# Patient Record
Sex: Female | Born: 1955 | Race: Black or African American | Hispanic: No | Smoking: Never smoker
Health system: Southern US, Community
[De-identification: ages and names within clinical notes are randomized; demographics above are authoritative.]

## PROBLEM LIST (undated history)

## (undated) DIAGNOSIS — E039 Hypothyroidism, unspecified: Secondary | ICD-10-CM

## (undated) DIAGNOSIS — F32A Depression, unspecified: Secondary | ICD-10-CM

## (undated) DIAGNOSIS — I509 Heart failure, unspecified: Secondary | ICD-10-CM

## (undated) DIAGNOSIS — E119 Type 2 diabetes mellitus without complications: Secondary | ICD-10-CM

## (undated) DIAGNOSIS — D649 Anemia, unspecified: Secondary | ICD-10-CM

## (undated) DIAGNOSIS — I2510 Atherosclerotic heart disease of native coronary artery without angina pectoris: Secondary | ICD-10-CM

## (undated) DIAGNOSIS — F419 Anxiety disorder, unspecified: Secondary | ICD-10-CM

## (undated) DIAGNOSIS — J449 Chronic obstructive pulmonary disease, unspecified: Secondary | ICD-10-CM

## (undated) DIAGNOSIS — I219 Acute myocardial infarction, unspecified: Secondary | ICD-10-CM

## (undated) DIAGNOSIS — N189 Chronic kidney disease, unspecified: Secondary | ICD-10-CM

## (undated) DIAGNOSIS — G4730 Sleep apnea, unspecified: Secondary | ICD-10-CM

## (undated) DIAGNOSIS — F329 Major depressive disorder, single episode, unspecified: Secondary | ICD-10-CM

## (undated) DIAGNOSIS — I10 Essential (primary) hypertension: Secondary | ICD-10-CM

## (undated) HISTORY — DX: Sleep apnea, unspecified: G47.30

## (undated) HISTORY — DX: Anxiety disorder, unspecified: F41.9

## (undated) HISTORY — PX: APPENDECTOMY: SHX54

## (undated) HISTORY — DX: Depression, unspecified: F32.A

## (undated) HISTORY — DX: Chronic obstructive pulmonary disease, unspecified: J44.9

## (undated) HISTORY — PX: BACK SURGERY: SHX140

## (undated) HISTORY — DX: Hypothyroidism, unspecified: E03.9

## (undated) HISTORY — DX: Anemia, unspecified: D64.9

## (undated) HISTORY — DX: Major depressive disorder, single episode, unspecified: F32.9

## (undated) HISTORY — DX: Acute myocardial infarction, unspecified: I21.9

## (undated) HISTORY — DX: Atherosclerotic heart disease of native coronary artery without angina pectoris: I25.10

---

## 2005-08-13 HISTORY — PX: TOTAL KNEE ARTHROPLASTY: SHX125

## 2017-08-29 ENCOUNTER — Other Ambulatory Visit: Payer: Self-pay

## 2017-08-29 ENCOUNTER — Encounter (HOSPITAL_COMMUNITY): Payer: Self-pay | Admitting: Emergency Medicine

## 2017-08-29 ENCOUNTER — Inpatient Hospital Stay (HOSPITAL_COMMUNITY)
Admission: EM | Admit: 2017-08-29 | Discharge: 2017-09-12 | DRG: 280 | Disposition: A | Discharge status: Home / Self Care | Payer: Medicaid Other | Attending: Internal Medicine | Admitting: Internal Medicine

## 2017-08-29 ENCOUNTER — Inpatient Hospital Stay (HOSPITAL_COMMUNITY): Payer: Medicaid Other

## 2017-08-29 ENCOUNTER — Emergency Department (HOSPITAL_COMMUNITY): Payer: Medicaid Other

## 2017-08-29 ENCOUNTER — Ambulatory Visit (HOSPITAL_COMMUNITY)
Admission: EM | Admit: 2017-08-29 | Discharge: 2017-08-29 | Disposition: A | Discharge status: Other Acute Inpatient Hospital | Payer: Medicaid Other | Attending: Internal Medicine | Admitting: Internal Medicine

## 2017-08-29 ENCOUNTER — Encounter (HOSPITAL_COMMUNITY): Payer: Self-pay | Admitting: Nephrology

## 2017-08-29 DIAGNOSIS — E1122 Type 2 diabetes mellitus with diabetic chronic kidney disease: Secondary | ICD-10-CM | POA: Diagnosis present

## 2017-08-29 DIAGNOSIS — I429 Cardiomyopathy, unspecified: Secondary | ICD-10-CM | POA: Diagnosis present

## 2017-08-29 DIAGNOSIS — I5033 Acute on chronic diastolic (congestive) heart failure: Secondary | ICD-10-CM | POA: Diagnosis not present

## 2017-08-29 DIAGNOSIS — E875 Hyperkalemia: Secondary | ICD-10-CM | POA: Diagnosis not present

## 2017-08-29 DIAGNOSIS — Z9889 Other specified postprocedural states: Secondary | ICD-10-CM

## 2017-08-29 DIAGNOSIS — D72829 Elevated white blood cell count, unspecified: Secondary | ICD-10-CM | POA: Diagnosis not present

## 2017-08-29 DIAGNOSIS — G9340 Encephalopathy, unspecified: Secondary | ICD-10-CM | POA: Diagnosis not present

## 2017-08-29 DIAGNOSIS — D62 Acute posthemorrhagic anemia: Secondary | ICD-10-CM

## 2017-08-29 DIAGNOSIS — I5043 Acute on chronic combined systolic (congestive) and diastolic (congestive) heart failure: Secondary | ICD-10-CM | POA: Diagnosis present

## 2017-08-29 DIAGNOSIS — D509 Iron deficiency anemia, unspecified: Secondary | ICD-10-CM | POA: Diagnosis present

## 2017-08-29 DIAGNOSIS — R569 Unspecified convulsions: Secondary | ICD-10-CM | POA: Diagnosis not present

## 2017-08-29 DIAGNOSIS — I469 Cardiac arrest, cause unspecified: Secondary | ICD-10-CM | POA: Diagnosis not present

## 2017-08-29 DIAGNOSIS — R0609 Other forms of dyspnea: Secondary | ICD-10-CM | POA: Diagnosis not present

## 2017-08-29 DIAGNOSIS — Z96651 Presence of right artificial knee joint: Secondary | ICD-10-CM | POA: Diagnosis present

## 2017-08-29 DIAGNOSIS — Z6831 Body mass index (BMI) 31.0-31.9, adult: Secondary | ICD-10-CM

## 2017-08-29 DIAGNOSIS — I25119 Atherosclerotic heart disease of native coronary artery with unspecified angina pectoris: Secondary | ICD-10-CM | POA: Diagnosis not present

## 2017-08-29 DIAGNOSIS — F419 Anxiety disorder, unspecified: Secondary | ICD-10-CM | POA: Diagnosis present

## 2017-08-29 DIAGNOSIS — E109 Type 1 diabetes mellitus without complications: Secondary | ICD-10-CM

## 2017-08-29 DIAGNOSIS — E873 Alkalosis: Secondary | ICD-10-CM | POA: Diagnosis not present

## 2017-08-29 DIAGNOSIS — Z8674 Personal history of sudden cardiac arrest: Secondary | ICD-10-CM | POA: Diagnosis present

## 2017-08-29 DIAGNOSIS — I130 Hypertensive heart and chronic kidney disease with heart failure and stage 1 through stage 4 chronic kidney disease, or unspecified chronic kidney disease: Secondary | ICD-10-CM | POA: Diagnosis present

## 2017-08-29 DIAGNOSIS — I10 Essential (primary) hypertension: Secondary | ICD-10-CM

## 2017-08-29 DIAGNOSIS — I214 Non-ST elevation (NSTEMI) myocardial infarction: Secondary | ICD-10-CM | POA: Diagnosis not present

## 2017-08-29 DIAGNOSIS — I5041 Acute combined systolic (congestive) and diastolic (congestive) heart failure: Secondary | ICD-10-CM | POA: Diagnosis not present

## 2017-08-29 DIAGNOSIS — E1165 Type 2 diabetes mellitus with hyperglycemia: Secondary | ICD-10-CM | POA: Diagnosis present

## 2017-08-29 DIAGNOSIS — J9601 Acute respiratory failure with hypoxia: Secondary | ICD-10-CM | POA: Diagnosis present

## 2017-08-29 DIAGNOSIS — E876 Hypokalemia: Secondary | ICD-10-CM | POA: Diagnosis present

## 2017-08-29 DIAGNOSIS — J810 Acute pulmonary edema: Secondary | ICD-10-CM

## 2017-08-29 DIAGNOSIS — I2510 Atherosclerotic heart disease of native coronary artery without angina pectoris: Secondary | ICD-10-CM | POA: Diagnosis present

## 2017-08-29 DIAGNOSIS — R0902 Hypoxemia: Secondary | ICD-10-CM

## 2017-08-29 DIAGNOSIS — R05 Cough: Secondary | ICD-10-CM | POA: Diagnosis not present

## 2017-08-29 DIAGNOSIS — E870 Hyperosmolality and hypernatremia: Secondary | ICD-10-CM | POA: Diagnosis not present

## 2017-08-29 DIAGNOSIS — Z91048 Other nonmedicinal substance allergy status: Secondary | ICD-10-CM

## 2017-08-29 DIAGNOSIS — Z794 Long term (current) use of insulin: Secondary | ICD-10-CM

## 2017-08-29 DIAGNOSIS — Z91018 Allergy to other foods: Secondary | ICD-10-CM

## 2017-08-29 DIAGNOSIS — R748 Abnormal levels of other serum enzymes: Secondary | ICD-10-CM | POA: Diagnosis not present

## 2017-08-29 DIAGNOSIS — J189 Pneumonia, unspecified organism: Secondary | ICD-10-CM | POA: Diagnosis present

## 2017-08-29 DIAGNOSIS — F17210 Nicotine dependence, cigarettes, uncomplicated: Secondary | ICD-10-CM | POA: Diagnosis present

## 2017-08-29 DIAGNOSIS — Z79899 Other long term (current) drug therapy: Secondary | ICD-10-CM

## 2017-08-29 DIAGNOSIS — Z4659 Encounter for fitting and adjustment of other gastrointestinal appliance and device: Secondary | ICD-10-CM

## 2017-08-29 DIAGNOSIS — E785 Hyperlipidemia, unspecified: Secondary | ICD-10-CM | POA: Diagnosis present

## 2017-08-29 DIAGNOSIS — Z7982 Long term (current) use of aspirin: Secondary | ICD-10-CM

## 2017-08-29 DIAGNOSIS — G9341 Metabolic encephalopathy: Secondary | ICD-10-CM | POA: Diagnosis not present

## 2017-08-29 DIAGNOSIS — R0602 Shortness of breath: Secondary | ICD-10-CM

## 2017-08-29 DIAGNOSIS — I509 Heart failure, unspecified: Secondary | ICD-10-CM

## 2017-08-29 DIAGNOSIS — R680 Hypothermia, not associated with low environmental temperature: Secondary | ICD-10-CM | POA: Diagnosis not present

## 2017-08-29 DIAGNOSIS — N182 Chronic kidney disease, stage 2 (mild): Secondary | ICD-10-CM | POA: Diagnosis not present

## 2017-08-29 DIAGNOSIS — E119 Type 2 diabetes mellitus without complications: Secondary | ICD-10-CM | POA: Diagnosis not present

## 2017-08-29 DIAGNOSIS — I340 Nonrheumatic mitral (valve) insufficiency: Secondary | ICD-10-CM | POA: Diagnosis not present

## 2017-08-29 DIAGNOSIS — N179 Acute kidney failure, unspecified: Secondary | ICD-10-CM | POA: Diagnosis present

## 2017-08-29 DIAGNOSIS — G4733 Obstructive sleep apnea (adult) (pediatric): Secondary | ICD-10-CM | POA: Diagnosis present

## 2017-08-29 DIAGNOSIS — I5031 Acute diastolic (congestive) heart failure: Secondary | ICD-10-CM | POA: Diagnosis not present

## 2017-08-29 DIAGNOSIS — J4520 Mild intermittent asthma, uncomplicated: Secondary | ICD-10-CM | POA: Diagnosis present

## 2017-08-29 DIAGNOSIS — Z23 Encounter for immunization: Secondary | ICD-10-CM

## 2017-08-29 DIAGNOSIS — E1129 Type 2 diabetes mellitus with other diabetic kidney complication: Secondary | ICD-10-CM

## 2017-08-29 DIAGNOSIS — Z720 Tobacco use: Secondary | ICD-10-CM | POA: Diagnosis not present

## 2017-08-29 DIAGNOSIS — E669 Obesity, unspecified: Secondary | ICD-10-CM | POA: Diagnosis present

## 2017-08-29 DIAGNOSIS — N184 Chronic kidney disease, stage 4 (severe): Secondary | ICD-10-CM | POA: Insufficient documentation

## 2017-08-29 DIAGNOSIS — N183 Chronic kidney disease, stage 3 (moderate): Secondary | ICD-10-CM

## 2017-08-29 DIAGNOSIS — Z888 Allergy status to other drugs, medicaments and biological substances status: Secondary | ICD-10-CM

## 2017-08-29 DIAGNOSIS — Z9289 Personal history of other medical treatment: Secondary | ICD-10-CM | POA: Diagnosis not present

## 2017-08-29 DIAGNOSIS — N189 Chronic kidney disease, unspecified: Secondary | ICD-10-CM | POA: Diagnosis not present

## 2017-08-29 DIAGNOSIS — Z931 Gastrostomy status: Secondary | ICD-10-CM

## 2017-08-29 DIAGNOSIS — IMO0001 Reserved for inherently not codable concepts without codable children: Secondary | ICD-10-CM

## 2017-08-29 DIAGNOSIS — Z452 Encounter for adjustment and management of vascular access device: Secondary | ICD-10-CM

## 2017-08-29 HISTORY — DX: Essential (primary) hypertension: I10

## 2017-08-29 HISTORY — DX: Type 2 diabetes mellitus without complications: E11.9

## 2017-08-29 HISTORY — DX: Chronic kidney disease, unspecified: N18.9

## 2017-08-29 LAB — CBC WITH DIFFERENTIAL/PLATELET
BASOS PCT: 0 %
Basophils Absolute: 0 10*3/uL (ref 0.0–0.1)
EOS ABS: 0.1 10*3/uL (ref 0.0–0.7)
Eosinophils Relative: 1 %
HCT: 29 % — ABNORMAL LOW (ref 36.0–46.0)
Hemoglobin: 9.1 g/dL — ABNORMAL LOW (ref 12.0–15.0)
Lymphocytes Relative: 20 %
Lymphs Abs: 2.5 10*3/uL (ref 0.7–4.0)
MCH: 20.8 pg — AB (ref 26.0–34.0)
MCHC: 31.4 g/dL (ref 30.0–36.0)
MCV: 66.4 fL — ABNORMAL LOW (ref 78.0–100.0)
MONO ABS: 0.9 10*3/uL (ref 0.1–1.0)
Monocytes Relative: 7 %
NEUTROS PCT: 72 %
Neutro Abs: 9.2 10*3/uL — ABNORMAL HIGH (ref 1.7–7.7)
PLATELETS: 350 10*3/uL (ref 150–400)
RBC: 4.37 MIL/uL (ref 3.87–5.11)
RDW: 21.5 % — ABNORMAL HIGH (ref 11.5–15.5)
WBC: 12.7 10*3/uL — ABNORMAL HIGH (ref 4.0–10.5)

## 2017-08-29 LAB — BASIC METABOLIC PANEL
Anion gap: 13 (ref 5–15)
BUN: 11 mg/dL (ref 6–20)
CALCIUM: 8.3 mg/dL — AB (ref 8.9–10.3)
CO2: 30 mmol/L (ref 22–32)
CREATININE: 1.43 mg/dL — AB (ref 0.44–1.00)
Chloride: 101 mmol/L (ref 101–111)
GFR, EST AFRICAN AMERICAN: 45 mL/min — AB (ref >60)
GFR, EST NON AFRICAN AMERICAN: 39 mL/min — AB (ref >60)
Glucose, Bld: 99 mg/dL (ref 65–99)
Potassium: 2.9 mmol/L — ABNORMAL LOW (ref 3.5–5.1)
SODIUM: 144 mmol/L (ref 135–145)

## 2017-08-29 LAB — GLUCOSE, CAPILLARY
GLUCOSE-CAPILLARY: 105 mg/dL — AB (ref 65–99)
Glucose-Capillary: 308 mg/dL — ABNORMAL HIGH (ref 65–99)

## 2017-08-29 LAB — FERRITIN: Ferritin: 16 ng/mL (ref 11–307)

## 2017-08-29 LAB — I-STAT TROPONIN, ED: TROPONIN I, POC: 0.01 ng/mL (ref 0.00–0.08)

## 2017-08-29 LAB — IRON AND TIBC
IRON: 26 ug/dL — AB (ref 28–170)
Saturation Ratios: 8 % — ABNORMAL LOW (ref 10.4–31.8)
TIBC: 337 ug/dL (ref 250–450)
UIBC: 311 ug/dL

## 2017-08-29 LAB — BRAIN NATRIURETIC PEPTIDE: B Natriuretic Peptide: 1016.4 pg/mL — ABNORMAL HIGH (ref 0.0–100.0)

## 2017-08-29 MED ORDER — GABAPENTIN 100 MG PO CAPS
100.0000 mg | ORAL_CAPSULE | Freq: Three times a day (TID) | ORAL | Status: DC
  Administered 2017-08-29 – 2017-09-02 (×13): 100 mg via ORAL
  Filled 2017-08-29 (×13): qty 1

## 2017-08-29 MED ORDER — LEVOFLOXACIN IN D5W 500 MG/100ML IV SOLN
500.0000 mg | INTRAVENOUS | Status: DC
  Administered 2017-08-29 – 2017-08-31 (×3): 500 mg via INTRAVENOUS
  Filled 2017-08-29 (×3): qty 100

## 2017-08-29 MED ORDER — IPRATROPIUM-ALBUTEROL 0.5-2.5 (3) MG/3ML IN SOLN: 3.0000 mL | Freq: Once | RESPIRATORY_TRACT | Status: DC

## 2017-08-29 MED ORDER — POTASSIUM CHLORIDE 10 MEQ/100ML IV SOLN
10.0000 meq | Freq: Once | INTRAVENOUS | Status: AC
  Administered 2017-08-29: 10 meq via INTRAVENOUS
  Filled 2017-08-29: qty 100

## 2017-08-29 MED ORDER — ASPIRIN EC 81 MG PO TBEC
81.0000 mg | DELAYED_RELEASE_TABLET | Freq: Every day | ORAL | Status: DC
  Administered 2017-08-29 – 2017-09-03 (×6): 81 mg via ORAL
  Filled 2017-08-29 (×6): qty 1

## 2017-08-29 MED ORDER — IPRATROPIUM-ALBUTEROL 0.5-2.5 (3) MG/3ML IN SOLN
3.0000 mL | Freq: Once | RESPIRATORY_TRACT | Status: AC
  Administered 2017-08-29: 3 mL via RESPIRATORY_TRACT

## 2017-08-29 MED ORDER — SODIUM CHLORIDE 0.9% FLUSH
3.0000 mL | Freq: Two times a day (BID) | INTRAVENOUS | Status: DC
  Administered 2017-08-29 – 2017-09-02 (×7): 3 mL via INTRAVENOUS

## 2017-08-29 MED ORDER — CITALOPRAM HYDROBROMIDE 20 MG PO TABS
20.0000 mg | ORAL_TABLET | Freq: Every day | ORAL | Status: DC
  Administered 2017-08-29 – 2017-09-02 (×5): 20 mg via ORAL
  Filled 2017-08-29 (×4): qty 1
  Filled 2017-08-29: qty 2

## 2017-08-29 MED ORDER — ACETAMINOPHEN 325 MG PO TABS
650.0000 mg | ORAL_TABLET | ORAL | Status: DC | PRN
  Administered 2017-08-30 – 2017-08-31 (×3): 650 mg via ORAL
  Filled 2017-08-29 (×3): qty 2

## 2017-08-29 MED ORDER — ENOXAPARIN SODIUM 30 MG/0.3ML ~~LOC~~ SOLN
30.0000 mg | SUBCUTANEOUS | Status: DC
  Administered 2017-08-29 – 2017-08-30 (×2): 30 mg via SUBCUTANEOUS
  Filled 2017-08-29 (×2): qty 0.3

## 2017-08-29 MED ORDER — GABAPENTIN 300 MG PO CAPS: 300.0000 mg | ORAL_CAPSULE | Freq: Three times a day (TID) | ORAL | Status: DC

## 2017-08-29 MED ORDER — ONDANSETRON HCL 4 MG/2ML IJ SOLN
4.0000 mg | Freq: Four times a day (QID) | INTRAMUSCULAR | Status: DC | PRN
  Filled 2017-08-29: qty 2

## 2017-08-29 MED ORDER — MORPHINE SULFATE (PF) 4 MG/ML IV SOLN: 4.0000 mg | Freq: Once | INTRAVENOUS | Status: DC

## 2017-08-29 MED ORDER — METHYLPREDNISOLONE SODIUM SUCC 125 MG IJ SOLR
80.0000 mg | Freq: Once | INTRAMUSCULAR | Status: AC
  Administered 2017-08-29: 80 mg via INTRAVENOUS
  Filled 2017-08-29: qty 2

## 2017-08-29 MED ORDER — FUROSEMIDE 10 MG/ML IJ SOLN
40.0000 mg | Freq: Two times a day (BID) | INTRAMUSCULAR | Status: DC
  Administered 2017-08-29 – 2017-08-30 (×2): 40 mg via INTRAVENOUS
  Filled 2017-08-29 (×2): qty 4

## 2017-08-29 MED ORDER — SODIUM CHLORIDE 0.9% FLUSH: 3.0000 mL | INTRAVENOUS | Status: DC | PRN

## 2017-08-29 MED ORDER — MORPHINE SULFATE (PF) 4 MG/ML IV SOLN
2.0000 mg | Freq: Once | INTRAVENOUS | Status: AC
  Administered 2017-08-29: 2 mg via INTRAVENOUS
  Filled 2017-08-29: qty 1

## 2017-08-29 MED ORDER — FUROSEMIDE 10 MG/ML IJ SOLN
20.0000 mg | Freq: Once | INTRAMUSCULAR | Status: DC
  Filled 2017-08-29: qty 2

## 2017-08-29 MED ORDER — IPRATROPIUM-ALBUTEROL 0.5-2.5 (3) MG/3ML IN SOLN
RESPIRATORY_TRACT | Status: AC
  Filled 2017-08-29: qty 3

## 2017-08-29 MED ORDER — INSULIN ASPART 100 UNIT/ML ~~LOC~~ SOLN
0.0000 [IU] | Freq: Three times a day (TID) | SUBCUTANEOUS | Status: DC
  Administered 2017-08-30: 3 [IU] via SUBCUTANEOUS
  Administered 2017-08-31: 1 [IU] via SUBCUTANEOUS
  Administered 2017-09-01: 2 [IU] via SUBCUTANEOUS
  Administered 2017-09-01 – 2017-09-02 (×2): 1 [IU] via SUBCUTANEOUS

## 2017-08-29 MED ORDER — MONTELUKAST SODIUM 10 MG PO TABS
10.0000 mg | ORAL_TABLET | Freq: Every day | ORAL | Status: DC
  Administered 2017-08-29 – 2017-09-02 (×5): 10 mg via ORAL
  Filled 2017-08-29 (×5): qty 1

## 2017-08-29 MED ORDER — POTASSIUM CHLORIDE CRYS ER 20 MEQ PO TBCR: 40.0000 meq | EXTENDED_RELEASE_TABLET | Freq: Two times a day (BID) | ORAL | Status: DC

## 2017-08-29 MED ORDER — PNEUMOCOCCAL VAC POLYVALENT 25 MCG/0.5ML IJ INJ
0.5000 mL | INJECTION | INTRAMUSCULAR | Status: AC
  Administered 2017-09-05: 0.5 mL via INTRAMUSCULAR
  Filled 2017-08-29 (×2): qty 0.5

## 2017-08-29 MED ORDER — LORATADINE 10 MG PO TABS
10.0000 mg | ORAL_TABLET | Freq: Every day | ORAL | Status: DC
  Administered 2017-08-29 – 2017-09-12 (×15): 10 mg via ORAL
  Filled 2017-08-29 (×15): qty 1

## 2017-08-29 MED ORDER — IPRATROPIUM-ALBUTEROL 0.5-2.5 (3) MG/3ML IN SOLN
3.0000 mL | Freq: Once | RESPIRATORY_TRACT | Status: AC
  Administered 2017-08-29: 3 mL via RESPIRATORY_TRACT
  Filled 2017-08-29: qty 3

## 2017-08-29 MED ORDER — IPRATROPIUM-ALBUTEROL 0.5-2.5 (3) MG/3ML IN SOLN
3.0000 mL | Freq: Four times a day (QID) | RESPIRATORY_TRACT | Status: DC
  Administered 2017-08-30 – 2017-09-02 (×15): 3 mL via RESPIRATORY_TRACT
  Filled 2017-08-29 (×17): qty 3

## 2017-08-29 MED ORDER — POTASSIUM CHLORIDE CRYS ER 20 MEQ PO TBCR
40.0000 meq | EXTENDED_RELEASE_TABLET | Freq: Three times a day (TID) | ORAL | Status: DC
  Administered 2017-08-29 – 2017-09-02 (×13): 40 meq via ORAL
  Filled 2017-08-29 (×13): qty 2

## 2017-08-29 MED ORDER — LABETALOL HCL 200 MG PO TABS
200.0000 mg | ORAL_TABLET | Freq: Two times a day (BID) | ORAL | Status: DC
  Administered 2017-08-29 – 2017-09-01 (×6): 200 mg via ORAL
  Filled 2017-08-29 (×6): qty 1

## 2017-08-29 MED ORDER — SODIUM CHLORIDE 0.9 % IV SOLN: 250.0000 mL | INTRAVENOUS | Status: DC | PRN

## 2017-08-29 MED ORDER — INSULIN GLARGINE 100 UNIT/ML ~~LOC~~ SOLN
15.0000 [IU] | Freq: Two times a day (BID) | SUBCUTANEOUS | Status: DC
  Administered 2017-08-29: 15 [IU] via SUBCUTANEOUS
  Filled 2017-08-29 (×2): qty 0.15

## 2017-08-29 MED ORDER — NICOTINE 14 MG/24HR TD PT24
14.0000 mg | MEDICATED_PATCH | Freq: Every day | TRANSDERMAL | Status: DC
  Administered 2017-08-30: 14 mg via TRANSDERMAL
  Filled 2017-08-29 (×4): qty 1

## 2017-08-29 NOTE — H&P (Signed)
History and Physical    Brenda Sims NWG:[id number] DOB: 08-19-1955 DOA: 08/29/2017  PCP: Patient, No Pcp Per  Patient coming from:Home  Chief Complaint: Shortness of breath and hypoxia  HPI: Brenda Sims is a 62 y.o. female with medical history significant of hypertension, type 2 diabetes on metformin and Lantus at home, obesity, mild intermittent asthma, knee surgery, presented with shortness of breath, dyspnea on exertion and coughing worsening for last to 3 days.  Patient was moved from [place] to [place] about a month ago.  Patient and her husband reported that the symptoms started before moving here.  She was having coughing and gradually worsening dyspnea on exertion.  The symptoms gradually worsened to the point that she was having difficulty breathing.  She went to the urgent care where she was found to be hypoxic did not improve with the breathing treatment therefore directed her to the ER.  Patient reported subjective fever, chills, nausea and overall not feeling well for last few weeks.  Denies headache, dizziness, abdomen pain, dysuria urgency, diarrhea or constipation.  Patient reports he is chest discomfort with coughing.  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  She retired in 2005 when she had knee surgery.  She lives with her husband.  Recently moved to [place] with the family.  Currently unemployed.  He smokes half a pack of cigarettes every day for a long time.  ED Course: In the ER patient was found to be hypoxic, hypokalemia, elevated BNP and acute pulmonary edema on chest x-ray.  Treated with potassium chloride, Lasix 20 mg IV and requested TRH to admit the patient.  Review of Systems: As per HPI otherwise 10 point review of systems negative.    Past Medical History:  Diagnosis Date  . Diabetes mellitus without complication ([place])   . Hypertension     Surgery history reviewed.  Left knee surgery in 2005.  Social  history: Reports that  has never smoked. She does not have any smokeless tobacco history on file. She reports that she does not drink alcohol. Her drug history is not on file.  Allergies  Allergen Reactions  . Azithromycin Swelling    Family history reviewed. No pertinent family history.   Prior to Admission medications   Medication Sig Start Date End Date Taking? Authorizing Provider  aspirin EC 81 MG tablet Take 81 mg by mouth daily.    [provider]  cetirizine (ZYRTEC) 10 MG tablet Take 10 mg by mouth daily.    [provider]  citalopram (CELEXA) 20 MG tablet Take 20 mg by mouth daily.    [provider]  cloNIDine (CATAPRES - DOSED IN MG/24 HR) 0.2 mg/24hr patch Place 0.2 mg onto the skin once a week.    [provider]  gabapentin (NEURONTIN) 300 MG capsule Take 300 mg by mouth 3 (three) times daily.    [provider]  insulin aspart (NOVOLOG) 100 UNIT/ML injection Inject into the skin 3 (three) times daily before meals.    [provider]  labetalol (NORMODYNE) 200 MG tablet Take 200 mg by mouth 2 (two) times daily.    [provider]  metFORMIN (GLUCOPHAGE) 1000 MG tablet Take 1,000 mg by mouth 2 (two) times daily with a meal.    [provider]  montelukast (SINGULAIR) 10 MG tablet Take 10 mg by mouth at bedtime.    [provider]    Physical Exam: Vitals:  08/29/17 1417 08/29/17 1419 08/29/17 1427 08/29/17 1613  BP: (!) 190/80   (!) 165/86  Pulse: 100   95  Resp: (!) 22     Temp: 99 F (37.2 C)     TempSrc: Oral     SpO2: 95% 94%  99%  Weight:   83.9 kg (185 lb)   Height:   5\' 4"  (1.626 m)       Constitutional: NAD, calm, comfortable Vitals:   08/29/17 1417 08/29/17 1419 08/29/17 1427 08/29/17 1613  BP: (!) 190/80   (!) 165/86  Pulse: 100   95  Resp: (!) 22     Temp: 99 F (37.2 C)     TempSrc: Oral     SpO2: 95% 94%  99%  Weight:   83.9 kg (185 lb)   Height:   5\' 4"   (1.626 m)    Eyes: PERRL, lids and conjunctivae normal ENMT: Mucous membranes are moist. Normal dentition.  Neck: normal, supple Respiratory: Bibasilar crackles, no wheezing.  Mild increased work of breathing. Cardiovascular: Regular rate and rhythm, S1S2 Normal. no murmurs / rubs / gallops. No extremity edema.   Abdomen: Soft, Non-tender, non-distended. Bowel sounds positive.  Musculoskeletal: no clubbing / cyanosis. No joint deformity upper and lower extremities. Skin: no rashes, lesions, ulcers. No induration Neurologic: CN 2-12 grossly intact. Sensation intact.  Strength 5/5 in all 4.  Psychiatric: Normal judgment and insight. Alert and oriented x 3. Normal mood.    Labs on Admission: I have personally reviewed following labs and imaging studies  CBC: Recent Labs  Lab 08/29/17 1530  WBC 12.7*  NEUTROABS 9.2*  HGB 9.1*  HCT 29.0*  MCV 66.4*  PLT 161   Basic Metabolic Panel: Recent Labs  Lab 08/29/17 1530  NA 144  K 2.9*  CL 101  CO2 30  GLUCOSE 99  BUN 11  CREATININE 1.43*  CALCIUM 8.3*   GFR: Estimated Creatinine Clearance: 43.3 mL/min (A) (by C-G formula based on SCr of 1.43 mg/dL (H)). Liver Function Tests: No results for input(s): AST, ALT, ALKPHOS, BILITOT, PROT, ALBUMIN in the last 168 hours. No results for input(s): LIPASE, AMYLASE in the last 168 hours. No results for input(s): AMMONIA in the last 168 hours. Coagulation Profile: No results for input(s): INR, PROTIME in the last 168 hours. Cardiac Enzymes: No results for input(s): CKTOTAL, CKMB, CKMBINDEX, TROPONINI in the last 168 hours. BNP (last 3 results) No results for input(s): PROBNP in the last 8760 hours. HbA1C: No results for input(s): HGBA1C in the last 72 hours. CBG: Recent Labs  Lab 08/29/17 1337  GLUCAP 105*   Lipid Profile: No results for input(s): CHOL, HDL, LDLCALC, TRIG, CHOLHDL, LDLDIRECT in the last 72 hours. Thyroid Function Tests: No results for input(s): TSH, T4TOTAL,  FREET4, T3FREE, THYROIDAB in the last 72 hours. Anemia Panel: No results for input(s): VITAMINB12, FOLATE, FERRITIN, TIBC, IRON, RETICCTPCT in the last 72 hours. Urine analysis: No results found for: COLORURINE, APPEARANCEUR, LABSPEC, PHURINE, GLUCOSEU, HGBUR, BILIRUBINUR, KETONESUR, PROTEINUR, UROBILINOGEN, NITRITE, LEUKOCYTESUR Sepsis Labs: !!!!!!!!!!!!!!!!!!!!!!!!!!!!!!!!!!!!!!!!!!!! @LABRCNTIP (procalcitonin:4,lacticidven:4) )No results found for this or any previous visit (from the past 240 hour(s)).   Radiological Exams on Admission: Dg Chest 2 View  Result Date: 08/29/2017 CLINICAL DATA:  Cough and body ache EXAM: CHEST  2 VIEW COMPARISON:  None. FINDINGS: No large effusion. Cardiomegaly with vascular congestion. Diffuse interstitial and mild alveolar opacity with more confluent opacity at the right base. Aortic atherosclerosis. No pneumothorax. IMPRESSION: 1. Cardiomegaly with vascular congestion. Diffuse interstitial  opacities suggests underlying edema. 2. More confluent opacity at the right greater than left lung base could reflect superimposed pneumonia Electronically Signed   By: Donavan Foil M.D.   On: 08/29/2017 15:52    EKG: Independently reviewed.  Sinus rhythm, left axis deviation and nonspecific changes.  Assessment/Plan  #Acute congestive heart failure/acute pulmonary edema/cardiomegaly -Patient presented with dyspnea on exertion, cough and found to have cardiomegaly, acute pulmonary edema and elevated BNP more than 1000.  No prior labs to compare.  Her symptoms has been going on for more than a month. -Start IV Lasix 40 twice a day, and sign out, daily weight -Echocardiogram -Continue aspirin, labetalol -Adjustment of cardiac medication pending her clinical response and echo result -Check TSH, lipid panel, A1c.  #Acute respiratory failure with hypoxia in the setting of CHF and may have left lower lung community-acquired pneumonia: Check flu since patient having a  subjective fever and congestion.  Start Levaquin. -Currently on 2 L of oxygen.  #Elevated serum creatinine level likely chronic kidney disease versus a AKI in the setting of CHF: No prior labs available to compare.  I will check UA, ultrasound kidneys to further evaluate.  On IV Lasix.  Monitor BMP.  #Hypokalemia: Replete potassium chloride 40 3 times daily.  Check magnesium in the morning.  Continue to monitor.  #Microcytic anemia: Check iron stores.  Monitor CBC.  #Essential hypertension: Continue Lasix, labetalol.  Monitor blood pressure closely.  #Type 2 diabetes: Patient reported taking Lantus 20 units twice a day and metformin 1000 twice daily at home.  Continue lower dose of Lantus and sliding scale.  Check A1c level.  Monitor blood sugar level.  #Mild intermittent asthma with seasonal allergy: Continue Claritin.  #Tobacco abuse: Patient was counseled.  Nicotine patch ordered.  Case manager referral to arrange for medication on discharge.  Patient was running out of prescription since she moved to [place].  DVT prophylaxis: Lovenox Code Status: Full code  family Communication: Discussed with the patient's husband at bedside Disposition Plan: Telemetry floor Consults called: None Admission status: Inpatient   Lee Kuang Tanna Furry MD [facility] Pager [phone]  If 7PM-7AM, please contact night-coverage [email or web address] Password [facility]  08/29/2017, 5:55 PM

## 2017-08-29 NOTE — ED Notes (Signed)
The patient is a 62 year old female, she is a diabetic and has high blood pressure, she denies smoking for the last 2 days but is been a chronic smoker, she does not drink any alcohol or use any illicit drugs.  She presents to the hospital with increasing shortness of breath and was found to be hypoxic on room air, she has never been on oxygen in the past.  She denies any prior cardiac history.  On exam the patient is not edematous however she does have some slight rales at the bases bilaterally, mild tachypnea and hypoxia requiring oxygen by nasal cannula.  His EKG is abnormal but nonspecific, labs reviewed and show some renal dysfunction and likely congestive heart failure.  There is a slight leukocytosis and with her coughing there is some suggestion that this could be related to pneumonia.  She will need to be admitted for further evaluation of both possible heart failure and treatment for pneumonia.  Patient is in agreement with the plan.   EKG Interpretation  Date/Time:  Thursday August 29 2017 14:35:45 EST Ventricular Rate:  98 PR Interval:  142 QRS Duration: 102 QT Interval:  422 QTC Calculation: 538 R Axis:   -65 Text Interpretation:  Normal sinus rhythm Left axis deviation Possible Anterior infarct , age undetermined Prolonged QT Abnormal ECG Since last tracing  baseline wandering Confirmed by Noemi Chapel [id number]) on 08/29/2017 5:22:15 PM       Medical screening examination/treatment/procedure(s) were conducted as a shared visit with non-physician practitioner(s) and myself.  I personally evaluated the patient during the encounter.  Clinical Impression:   Final diagnoses:  Dyspnea on exertion  Acute pulmonary edema ([place])         Noemi Chapel, MD 08/30/17 1459

## 2017-08-29 NOTE — ED Notes (Signed)
Pt to ultrasound

## 2017-08-29 NOTE — ED Notes (Signed)
[facility] called for report on pt.  Call was disconnected, they did not call back.

## 2017-08-29 NOTE — ED Notes (Signed)
Amy PA informed of pt condition

## 2017-08-29 NOTE — Discharge Instructions (Signed)
Given exam, will transfer you via EMS to the emergency department for further evaluation.

## 2017-08-29 NOTE — ED Provider Notes (Signed)
[facility] EMERGENCY DEPARTMENT Provider Note   CSN: [id number] Arrival date & time: 08/29/17  1411     History   Chief Complaint Chief Complaint  Patient presents with  . Shortness of Breath    HPI Brenda Sims is a 62 y.o. female personal history of diabetes, hypertension who presents for evaluation of shortness of breath that is progressively worsening over the last 2-3 days.  Patient reports that she has had some wheezing over the last 2 days also.  She states wheezing and symptoms are worse with exertion. She states that she does not have a normal oxygen requirement at home.  She does have a history of asthma and states that she usually has nebulizers.  Patient recently moved to the area and has been out of her inhaler, blood pressure medication and insulin for several weeks.  Patient also reports a productive cough of white sputum.  She also reports fever of 100.  She has not taking medications for symptoms.  She also reports generalized malaise, including chest pain and arm pain. Patient states that for the last several months patient has had to sleep sitting upright. Patient denies any abdominal pain, leg swelling, nausea/vomiting dysuria, hematuria, headache, vision changes, numbness/weakness of her extremities.   The history is provided by the patient.    Past Medical History:  Diagnosis Date  . Diabetes mellitus without complication ([place])   . Hypertension     There are no active problems to display for this patient.   No past surgical history on file.  OB History    No data available       Home Medications    Prior to Admission medications   Medication Sig Start Date End Date Taking? Authorizing Provider  aspirin EC 81 MG tablet Take 81 mg by mouth daily.    [provider]  cetirizine (ZYRTEC) 10 MG tablet Take 10 mg by mouth daily.    [provider]  citalopram (CELEXA) 20 MG tablet Take 20 mg by mouth daily.    [provider]  cloNIDine (CATAPRES - DOSED IN MG/24 HR) 0.2 mg/24hr patch Place 0.2 mg onto the skin once a week.    [provider]  gabapentin (NEURONTIN) 300 MG capsule Take 300 mg by mouth 3 (three) times daily.    [provider]  insulin aspart (NOVOLOG) 100 UNIT/ML injection Inject into the skin 3 (three) times daily before meals.    [provider]  labetalol (NORMODYNE) 200 MG tablet Take 200 mg by mouth 2 (two) times daily.    [provider]  metFORMIN (GLUCOPHAGE) 1000 MG tablet Take 1,000 mg by mouth 2 (two) times daily with a meal.    [provider]  montelukast (SINGULAIR) 10 MG tablet Take 10 mg by mouth at bedtime.    [provider]    Family History No family history on file.  Social History Social History   Tobacco Use  . Smoking status: Never Smoker  Substance Use Topics  . Alcohol use: No    Frequency: Never  . Drug use: Not on file     Allergies   Azithromycin   Review of Systems Review of Systems  Constitutional: Negative for fever.  Respiratory: Positive for shortness of breath. Negative for cough.   Cardiovascular: Negative for chest pain.  Gastrointestinal: Negative for abdominal pain, nausea and vomiting.  Genitourinary: Negative for dysuria and hematuria.  Neurological: Negative for headaches.  Physical Exam Updated Vital Signs BP (!) 165/86   Pulse 95   Temp 99 F (37.2 C) (Oral)   Resp (!) 22   Ht 5\' 4"  (1.626 m)   Wt 83.9 kg (185 lb)   SpO2 99%   BMI 31.76 kg/m   Physical Exam  Constitutional: She is oriented to person, place, and time. She appears well-developed and well-nourished.  HENT:  Head: Normocephalic and atraumatic.  Mouth/Throat: Oropharynx is clear and moist. Mucous membranes are dry.  Eyes: Conjunctivae, EOM and lids are normal. Pupils are equal, round, and reactive to light. Right eye exhibits no discharge. Left eye exhibits no discharge. No scleral  icterus.  Neck: Full passive range of motion without pain.  Paraspinal tenderness noted to the bilateral paraspinal muscles of the cervical region.  Full range of motion without difficulty.  Patient does have positive Spurling's maneuver.   Cardiovascular: Regular rhythm, normal heart sounds and normal pulses. Tachycardia present. Exam reveals no gallop and no friction rub.  No murmur heard. Pulmonary/Chest: Effort normal. Tachypnea noted. She has wheezes. She has rales.  Able to speak in short sentences without difficulty. Rales noted to the lower lung fields bilatearlly. Faint wheezing noted.  Abdominal: Soft. Normal appearance. There is no tenderness. There is no rigidity and no guarding.  No ascites  Musculoskeletal: Normal range of motion.  BLE are symmetric in appearance. No edema noted.  Neurological: She is alert and oriented to person, place, and time.  Follows commands, Moves all extremities  5/5 strength to BUE and BLE  Sensation intact throughout all major nerve distributions  Skin: Skin is warm and dry. Capillary refill takes less than 2 seconds.  Psychiatric: She has a normal mood and affect. Her speech is normal and behavior is normal.  Nursing note and vitals reviewed.    ED Treatments / Results  Labs (all labs ordered are listed, but only abnormal results are displayed) Labs Reviewed  BASIC METABOLIC PANEL - Abnormal; Notable for the following components:      Result Value   Potassium 2.9 (*)    Creatinine, Ser 1.43 (*)    Calcium 8.3 (*)    GFR calc non Af Amer 39 (*)    GFR calc Af Amer 45 (*)    All other components within normal limits  CBC WITH DIFFERENTIAL/PLATELET - Abnormal; Notable for the following components:   WBC 12.7 (*)    Hemoglobin 9.1 (*)    HCT 29.0 (*)    MCV 66.4 (*)    MCH 20.8 (*)    RDW 21.5 (*)    Neutro Abs 9.2 (*)    All other components within normal limits  BRAIN NATRIURETIC PEPTIDE - Abnormal; Notable for the following  components:   B Natriuretic Peptide 1,016.4 (*)    All other components within normal limits  CBG MONITORING, ED  I-STAT TROPONIN, ED    EKG  EKG Interpretation  Date/Time:  Thursday August 29 2017 14:35:45 EST Ventricular Rate:  98 PR Interval:  142 QRS Duration: 102 QT Interval:  422 QTC Calculation: 538 R Axis:   -65 Text Interpretation:  Normal sinus rhythm Left axis deviation Possible Anterior infarct , age undetermined Prolonged QT Abnormal ECG Since last tracing  baseline wandering Confirmed by Noemi Chapel [phone]) on 08/29/2017 5:22:15 PM       Radiology Dg Chest 2 View  Result Date: 08/29/2017 CLINICAL DATA:  Cough and body ache EXAM: CHEST  2 VIEW COMPARISON:  None. FINDINGS: No  large effusion. Cardiomegaly with vascular congestion. Diffuse interstitial and mild alveolar opacity with more confluent opacity at the right base. Aortic atherosclerosis. No pneumothorax. IMPRESSION: 1. Cardiomegaly with vascular congestion. Diffuse interstitial opacities suggests underlying edema. 2. More confluent opacity at the right greater than left lung base could reflect superimposed pneumonia Electronically Signed   By: Donavan Foil M.D.   On: 08/29/2017 15:52    Procedures Procedures (including critical care time)  Medications Ordered in ED Medications  morphine 4 MG/ML injection 2 mg (not administered)  furosemide (LASIX) injection 20 mg (not administered)  potassium chloride 10 mEq in 100 mL IVPB (not administered)  ipratropium-albuterol (DUONEB) 0.5-2.5 (3) MG/3ML nebulizer solution 3 mL (3 mLs Nebulization Given 08/29/17 1537)  methylPREDNISolone sodium succinate (SOLU-MEDROL) 125 mg/2 mL injection 80 mg (80 mg Intravenous Given 08/29/17 1538)     Initial Impression / Assessment and Plan / ED Course  I have reviewed the triage vital signs and the nursing notes.  Pertinent labs & imaging results that were available during my care of the patient were reviewed by me and  considered in my medical decision making (see chart for details).     62 y.o. F with PMH/o DM and HTN who presents from urgent care for evaluation of shortness of breath that is been ongoing for the last 2-3 days.  Associated with wheezing, cough, fever.  At initial urgent care evaluation, patient was tachypneic, with inspiratory and expiratory wheezing and O2 sat were 86%.  They improved with deep breathing.  Patient received nebulizer treatment in the emergency department.  Lung sounds improved but patient was still symptomatic, with additional tachycardia.  Patient was transferred to ED for further evaluation.  Patient has been out of her insulin and hypertension medications for the last few weeks since she moved to the area.  Patient is hypertensive and O2 sats are 95% on 2 L O2.  Patient is not on any oxygen requirement at home.  Patient is afebrile.  Physical exam patient still does have some faint wheezing noted and rales, most notably in the lower lung fields.  Consider asthma exacerbation versus pneumonia vs new onset CHF.  Patient does not appear fluid overload. History/physical exam not concerning for PE.  For basic labs, chest x-ray, EKG, troponin.  Will give additional nebulizer treatment.   Reevaluation after nebulizer treatment.  Wheezing has improved.  Patient notes some improvement but she is still requiring 2 L of oxygen which is new for her.  Vital signs reviewed.  Blood pressure has improved.  Patient O2 sat at 99% but patient still requiring 2 L of O2.  Troponin is negative.  CBC shows leukocytosis of 12.7.  Patient does have some signs of anemia.  Cannot compare from baseline.  BMP hypokalemia at 2.9.  Creatinine is 1.43.  Chest x-ray does show cardiomegaly with vascular congestion possible interstitial edema.  There also is questionable superimposed pneumonia noted to the right lower lung field. Will add on BNP.   BNP reviewed 1016. Suspect this is new onset CHF vs pneumonia. Will  plan for admission.   Discussed patient with hospitalist. Will admit.   Final Clinical Impressions(s) / ED Diagnoses   Final diagnoses:  Dyspnea on exertion  Acute pulmonary edema [facility])    ED Discharge Orders    None       Desma Mcgregor 08/29/17 1741    Noemi Chapel, MD 08/30/17 1459

## 2017-08-29 NOTE — ED Notes (Signed)
Pt ambulated while on pulse oximetry, Pt's O2 sats where at 90-92% HR in the 90s at the beginning of the walk upon excertion her O2 sats dropped to 88-89% HR in the 100s and the Pt became SOB and WOB increased.

## 2017-08-29 NOTE — ED Triage Notes (Signed)
Pt c/o SOB x2-3, got worse today. Also c/o cough. Pt is breathing very shallow quick breaths in triage, SPo2 86%. Pt slowed down breathing, took deep breaths, spo2 92%

## 2017-08-29 NOTE — ED Triage Notes (Signed)
Patient at urgent and transported by [facility]. Patient c/o SOB x "several days" and was given 2 duonebs for tx. Per report, Urgent Care felt increased heartrate after treatment needed tx at ED. Pt also c/o no insulin x several weeks and muscular chest and arm pain

## 2017-08-29 NOTE — ED Provider Notes (Signed)
62 year old female with history of diabetes, hypertension comes in for 2-3-day history of shortness of breath that worsened today.  She also has cough.  During triage, patient with shallow breathing and tachypnea.  O2 saturation 86%.  With some deep breathing, O2 saturation increased to 92%.  Lung exam with diffuse expiratory and inspiratory wheezing and decreased air movement.  She was found to be tachycardic as well.  After DuoNeb treatment, patient states shortness of breath without any improvement.  Lung exam with better air movement, minimal residual wheezing.  Patient continues to be tachypneic and unable to speak in full sentences. Will start second DuoNeb, transfer via EMS to the emergency department.  Patient recently moved to [facility], and has been out of her antihypertensives and insulin medications for a few weeks.  Blood glucose 105.   Ok Edwards, PA-C 08/29/17 2039

## 2017-08-29 NOTE — Progress Notes (Signed)
Pharmacy Antibiotic Note  Brenda Sims is a 62 y.o. female admitted on 08/29/2017 with pneumonia.  Pharmacy has been consulted for levaquin dosing.  Plan: Start levaquin 500mg  IV every 24 hours due to CrCl less than 50 mL/min. Consider oral antibiotic when able to tolerate.  Monitor clinical progression and renal function.  Height: 5\' 4"  (162.6 cm) Weight: 185 lb (83.9 kg) IBW/kg (Calculated) : 54.7  Temp (24hrs), Avg:98.8 F (37.1 C), Min:98.7 F (37.1 C), Max:99 F (37.2 C)  Recent Labs  Lab 08/29/17 1530  WBC 12.7*  CREATININE 1.43*    Estimated Creatinine Clearance: 43.3 mL/min (A) (by C-G formula based on SCr of 1.43 mg/dL (H)).    Allergies  Allergen Reactions  . Azithromycin Swelling    Antimicrobials this admission: Levaquin 1/17>>   Dose adjustments this admission: N/A  Microbiology results: None ordered at this time.  Thank you for allowing pharmacy to be a part of this patient's care.  Janki Dike C [place],PharmD 08/29/2017 6:05 PM

## 2017-08-29 NOTE — ED Triage Notes (Signed)
Pt has been out of her BP meds and insulin meds for a few weeks.

## 2017-08-29 NOTE — Care Management (Signed)
CM consults reviewed. Patient is being admitted. Unit CM to follow-up with the patient for discharge needs. Venita Sheffield RN CCM

## 2017-08-29 NOTE — ED Notes (Signed)
Did ekg shown to Dr Billy Fischer patient is resting with family at bedside

## 2017-08-30 ENCOUNTER — Inpatient Hospital Stay (HOSPITAL_COMMUNITY): Payer: Medicaid Other

## 2017-08-30 ENCOUNTER — Encounter (HOSPITAL_COMMUNITY): Payer: Self-pay | Admitting: General Practice

## 2017-08-30 DIAGNOSIS — I340 Nonrheumatic mitral (valve) insufficiency: Secondary | ICD-10-CM

## 2017-08-30 DIAGNOSIS — I5031 Acute diastolic (congestive) heart failure: Secondary | ICD-10-CM

## 2017-08-30 LAB — BASIC METABOLIC PANEL
Anion gap: 14 (ref 5–15)
BUN: 25 mg/dL — ABNORMAL HIGH (ref 6–20)
CALCIUM: 8.2 mg/dL — AB (ref 8.9–10.3)
CHLORIDE: 100 mmol/L — AB (ref 101–111)
CO2: 28 mmol/L (ref 22–32)
Creatinine, Ser: 1.88 mg/dL — ABNORMAL HIGH (ref 0.44–1.00)
GFR calc Af Amer: 32 mL/min — ABNORMAL LOW (ref >60)
GFR calc non Af Amer: 28 mL/min — ABNORMAL LOW (ref >60)
GLUCOSE: 195 mg/dL — AB (ref 65–99)
Potassium: 3.4 mmol/L — ABNORMAL LOW (ref 3.5–5.1)
Sodium: 142 mmol/L (ref 135–145)

## 2017-08-30 LAB — HIV ANTIBODY (ROUTINE TESTING W REFLEX): HIV Screen 4th Generation wRfx: NONREACTIVE

## 2017-08-30 LAB — URINALYSIS, ROUTINE W REFLEX MICROSCOPIC
Bilirubin Urine: NEGATIVE
GLUCOSE, UA: 50 mg/dL — AB
Hgb urine dipstick: NEGATIVE
Ketones, ur: NEGATIVE mg/dL
Leukocytes, UA: NEGATIVE
Nitrite: NEGATIVE
PH: 5 (ref 5.0–8.0)
Protein, ur: 100 mg/dL — AB
SPECIFIC GRAVITY, URINE: 1.01 (ref 1.005–1.030)

## 2017-08-30 LAB — CBC
HCT: 27.8 % — ABNORMAL LOW (ref 36.0–46.0)
HEMOGLOBIN: 8.7 g/dL — AB (ref 12.0–15.0)
MCH: 20.8 pg — AB (ref 26.0–34.0)
MCHC: 31.3 g/dL (ref 30.0–36.0)
MCV: 66.5 fL — ABNORMAL LOW (ref 78.0–100.0)
Platelets: 342 10*3/uL (ref 150–400)
RBC: 4.18 MIL/uL (ref 3.87–5.11)
RDW: 21.6 % — ABNORMAL HIGH (ref 11.5–15.5)
WBC: 11.6 10*3/uL — AB (ref 4.0–10.5)

## 2017-08-30 LAB — HEMOGLOBIN A1C
Hgb A1c MFr Bld: 6.6 % — ABNORMAL HIGH (ref 4.8–5.6)
MEAN PLASMA GLUCOSE: 142.72 mg/dL

## 2017-08-30 LAB — GLUCOSE, CAPILLARY
GLUCOSE-CAPILLARY: 106 mg/dL — AB (ref 65–99)
Glucose-Capillary: 202 mg/dL — ABNORMAL HIGH (ref 65–99)
Glucose-Capillary: 100 mg/dL — ABNORMAL HIGH (ref 65–99)
Glucose-Capillary: 124 mg/dL — ABNORMAL HIGH (ref 65–99)

## 2017-08-30 LAB — ECHOCARDIOGRAM COMPLETE
Height: 64 in
Weight: 2870.4 oz

## 2017-08-30 LAB — TSH: TSH: 0.948 u[IU]/mL (ref 0.350–4.500)

## 2017-08-30 LAB — INFLUENZA PANEL BY PCR (TYPE A & B)
INFLAPCR: NEGATIVE
INFLBPCR: NEGATIVE

## 2017-08-30 LAB — LIPID PANEL
CHOL/HDL RATIO: 4.8 ratio
CHOLESTEROL: 168 mg/dL (ref 0–200)
HDL: 35 mg/dL — ABNORMAL LOW (ref >40)
LDL Cholesterol: 110 mg/dL — ABNORMAL HIGH (ref 0–99)
Triglycerides: 114 mg/dL (ref <150)
VLDL: 23 mg/dL (ref 0–40)

## 2017-08-30 LAB — MAGNESIUM: MAGNESIUM: 1.6 mg/dL — AB (ref 1.7–2.4)

## 2017-08-30 MED ORDER — ATORVASTATIN CALCIUM 40 MG PO TABS
40.0000 mg | ORAL_TABLET | Freq: Every day | ORAL | Status: DC
  Administered 2017-08-30 – 2017-09-11 (×13): 40 mg via ORAL
  Filled 2017-08-30 (×13): qty 1

## 2017-08-30 MED ORDER — INSULIN GLARGINE 100 UNIT/ML ~~LOC~~ SOLN
20.0000 [IU] | Freq: Two times a day (BID) | SUBCUTANEOUS | Status: DC
  Administered 2017-08-30 – 2017-09-02 (×6): 20 [IU] via SUBCUTANEOUS
  Filled 2017-08-30 (×9): qty 0.2

## 2017-08-30 MED ORDER — MAGNESIUM SULFATE 2 GM/50ML IV SOLN
2.0000 g | Freq: Once | INTRAVENOUS | Status: AC
  Administered 2017-08-30: 2 g via INTRAVENOUS
  Filled 2017-08-30: qty 50

## 2017-08-30 MED ORDER — HYDRALAZINE HCL 25 MG PO TABS
25.0000 mg | ORAL_TABLET | Freq: Three times a day (TID) | ORAL | Status: DC
  Administered 2017-08-30 – 2017-08-31 (×3): 25 mg via ORAL
  Filled 2017-08-30 (×3): qty 1

## 2017-08-30 MED ORDER — FERROUS SULFATE 325 (65 FE) MG PO TABS
325.0000 mg | ORAL_TABLET | Freq: Two times a day (BID) | ORAL | Status: DC
  Administered 2017-08-30 – 2017-09-02 (×7): 325 mg via ORAL
  Filled 2017-08-30 (×7): qty 1

## 2017-08-30 MED ORDER — FUROSEMIDE 40 MG PO TABS
40.0000 mg | ORAL_TABLET | Freq: Two times a day (BID) | ORAL | Status: DC
  Administered 2017-08-31: 40 mg via ORAL
  Filled 2017-08-30: qty 1

## 2017-08-30 NOTE — Plan of Care (Signed)
  Progressing Activity: Capacity to carry out activities will improve 08/30/2017 0323 - Progressing by Ruben Im, RN Cardiac: Ability to achieve and maintain adequate cardiopulmonary perfusion will improve 08/30/2017 0323 - Progressing by Ruben Im, RN

## 2017-08-30 NOTE — Progress Notes (Signed)
Pt will need primary MD, pt just moved from [place] 31mo ago, will also need an CBG machine, thanks [facility] [place]

## 2017-08-30 NOTE — Progress Notes (Signed)
*  PRELIMINARY RESULTS* Echocardiogram 2D Echocardiogram has been performed.  Brenda Sims 08/30/2017, 9:10 AM

## 2017-08-30 NOTE — Evaluation (Signed)
Occupational Therapy Evaluation and Discharge Patient Details Name: Brenda Sims MRN: [id number] DOB: 04-Jan-1956 Today's Date: 08/30/2017    History of Present Illness Pt is a 62 y.o. female admitted  08/29/17 with acute respiratory failure with hypoxia in the setting of CHF; may have left lower lung CAP. PMH includes HTN, DM.   Clinical Impression   PTA Pt independent in ADL and mobility, drives, assists with care for father-in-law. Pt is currently at mod I/independent level overall. O2 dropped after moving around room to 85%, with pursed lip breathing increased to 96%. Energy conservation education provided verbally. Pt and husband understand. OT to sign off at this time thank you for the opportunity to serve this patient.     Follow Up Recommendations  No OT follow up    Equipment Recommendations  None recommended by OT    Recommendations for Other Services       Precautions / Restrictions Precautions Precautions: Fall Precaution Comments: watch O2 Restrictions Weight Bearing Restrictions: No      Mobility Bed Mobility Overal bed mobility: Independent                Transfers Overall transfer level: Independent                    Balance Overall balance assessment: No apparent balance deficits (not formally assessed)                                         ADL either performed or assessed with clinical judgement   ADL Overall ADL's : Modified independent                                       General ADL Comments: educated on energy conservation techniques, use of spirometer     Vision Baseline Vision/History: Wears glasses Wears Glasses: At all times Patient Visual Report: No change from baseline       Perception     Praxis      Pertinent Vitals/Pain Pain Assessment: No/denies pain     Hand Dominance Left   Extremity/Trunk Assessment Upper Extremity Assessment Upper Extremity Assessment: Overall WFL  for tasks assessed   Lower Extremity Assessment Lower Extremity Assessment: Defer to PT evaluation       Communication Communication Communication: No difficulties   Cognition Arousal/Alertness: Awake/alert Behavior During Therapy: WFL for tasks assessed/performed Overall Cognitive Status: Within Functional Limits for tasks assessed                                     General Comments  Pt's husband present during session    Exercises     Shoulder Instructions      Home Living Family/patient expects to be discharged to:: Private residence Living Arrangements: Spouse/significant other;Parent Available Help at Discharge: Family;Available PRN/intermittently(father in law) Type of Home: House Home Access: Stairs to enter;Ramped entrance     Home Layout: One level     Bathroom Shower/Tub: Tub/shower unit;Curtain   Biochemist, clinical: Standard Bathroom Accessibility: Yes How Accessible: Accessible via walker Home Equipment: Shower seat          Prior Functioning/Environment Level of Independence: Independent  OT Problem List:        OT Treatment/Interventions:      OT Goals(Current goals can be found in the care plan section) Acute Rehab OT Goals Patient Stated Goal: to go home OT Goal Formulation: With patient/family Time For Goal Achievement: 09/12/17 Potential to Achieve Goals: Good  OT Frequency:     Barriers to D/C:            Co-evaluation              AM-PAC PT "6 Clicks" Daily Activity     Outcome Measure Help from another person eating meals?: None Help from another person taking care of personal grooming?: None Help from another person toileting, which includes using toliet, bedpan, or urinal?: None Help from another person bathing (including washing, rinsing, drying)?: None Help from another person to put on and taking off regular upper body clothing?: None Help from another person to put on and taking  off regular lower body clothing?: None 6 Click Score: 24   End of Session Equipment Utilized During Treatment: Gait belt;Oxygen(3L at end of session 4L) Nurse Communication: Mobility status  Activity Tolerance: Patient tolerated treatment well Patient left: in bed;with call bell/phone within reach                   Time: 0925-0945 OT Time Calculation (min): 20 min Charges:  OT General Charges $OT Visit: 1 Visit OT Evaluation $OT Eval Low Complexity: 1 Low G-Codes:     Hulda Humphrey OTR/L [place] 08/30/2017, 10:01 AM

## 2017-08-30 NOTE — Progress Notes (Signed)
PROGRESS NOTE    Brenda Sims  URK:[id number] DOB: 06-04-1956 DOA: 08/29/2017 PCP: Patient, No Pcp Per   Brief Narrative: 62 y.o. female with medical history significant of hypertension, type 2 diabetes on metformin and Lantus at home, obesity, mild intermittent asthma, knee surgery, presented with shortness of breath, dyspnea on exertion and coughing worsening for last to 3 days.  Patient was moved from [place] to [place] about a month ago.  Patient and her husband reported that the symptoms started before moving here.  She was having coughing and gradually worsening dyspnea on exertion.  The symptoms gradually worsened to the point that she was having difficulty breathing.  She went to the urgent care where she was found to be hypoxic did not improve with the breathing treatment therefore directed her to the ER.  In the ER patient was found to be hypoxic, hypokalemia, elevated BNP and acute pulmonary edema on chest x-ray.  Treated with potassium chloride, Lasix 20 mg IV and admitted for further evaluation.  Assessment & Plan:  #Acute diastolic congestive heart failure/acute pulmonary edema/cardiomegaly -Patient presented with dyspnea on exertion, cough and found to have cardiomegaly, acute pulmonary edema and elevated BNP more than 1016.  No prior labs to compare.  Her symptoms has been going on for more than a month. -Shortness of breath is better with IV Lasix.  Noticed to have elevated serum creatinine level therefore switching to oral Lasix.  Monitor BMP in the morning.  Echocardiogram with mild valvular disease and diastolic dysfunction.  Patient does not have chest pain. -Continue aspirin, labetalol.   -No AACEI or ARB because of renal failure. -TSH level acceptable.  #Acute respiratory failure with hypoxia in the setting of CHF and may have left lower lung community-acquired pneumonia:  -Flu negative.  Not hypoxic today.  Continue Levaquin for total 5 days.  #Elevated serum  creatinine level likely chronic kidney disease versus a AKI in the setting of CHF: No prior labs available to compare.  -Ultrasound of kidneys consistent with medical renal disease.  No sign of UTI.  Elevated creatinine today likely hemodynamically mediated due to Lasix.  Switching to oral.  Repeat lab in the morning.  #Hypokalemia/hypomagnesemia: Replete potassium chloride and magnesium.  Repeat lab in the morning.  #Microcytic anemia/iron deficiency anemia: Start oral iron. Monitor CBC.  #Essential hypertension: Continue Lasix, labetalol.  Elevated.  I will add hydralazine.  Patient was not taking her antihypertensive medication about 2-3 weeks prior to admission.  Case manager evaluation ongoing. Monitor blood pressure closely.  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  Increase the dose of Lantus to 20 units twice a day.  Continue sliding scale.  Monitor blood sugar level.  #Mild intermittent asthma with seasonal allergy: Continue Claritin.  #Tobacco abuse: Patient was counseled.  Nicotine patch ordered.  DVT prophylaxis: Lovenox subcutaneous Code Status: Full code Family Communication: No family at bedside Disposition Plan: Currently admitted.  Likely discharge home tomorrow.    Consultants:   None  Procedures: Echo Antimicrobials: Levaquin  Subjective: Seen and examined at bedside.  Shortness of breath is better.  No chest pain.  No nausea vomiting.  Husband at bedside.  Objective: Vitals:   08/29/17 2217 08/29/17 2331 08/30/17 0100 08/30/17 0614  BP: (!) 183/96 (!) 183/83 (!) 163/73 (!) 150/88  Pulse: 98 93 82 78  Resp: (!) 22 20 18 18   Temp: 97.8 F (36.6 C) 98.7 F (37.1 C) 98 F (36.7 C) 97.8 F (36.6 C)  TempSrc: Oral Oral Oral Oral  SpO2: 100% 99% 98% 98%  Weight: 81.4 kg (179 lb 6.4 oz)     Height: 5\' 4"  (1.626 m)       Intake/Output Summary (Last 24 hours) at 08/30/2017 1316 Last data filed at 08/30/2017 0848 Gross per 24 hour    Intake 360 ml  Output 225 ml  Net 135 ml   Filed Weights   08/29/17 1427 08/29/17 2217  Weight: 83.9 kg (185 lb) 81.4 kg (179 lb 6.4 oz)    Examination:  General exam: Appears calm and comfortable  Respiratory system: Bibasal mild rhonchi, no wheezing.  Respiratory effort normal. Cardiovascular system: S1 & S2 heard, RRR.  No pedal edema. Gastrointestinal system: Abdomen is nondistended, soft and nontender. Normal bowel sounds heard. Central nervous system: Alert and oriented. No focal neurological deficits. Extremities: Symmetric 5 x 5 power. Skin: No rashes, lesions or ulcers Psychiatry: Judgement and insight appear normal. Mood & affect appropriate.     Data Reviewed: I have personally reviewed following labs and imaging studies  CBC: Recent Labs  Lab 08/29/17 1530 08/30/17 0623  WBC 12.7* 11.6*  NEUTROABS 9.2*  --   HGB 9.1* 8.7*  HCT 29.0* 27.8*  MCV 66.4* 66.5*  PLT 350 341   Basic Metabolic Panel: Recent Labs  Lab 08/29/17 1530 08/30/17 0623  NA 144 142  K 2.9* 3.4*  CL 101 100*  CO2 30 28  GLUCOSE 99 195*  BUN 11 25*  CREATININE 1.43* 1.88*  CALCIUM 8.3* 8.2*  MG  --  1.6*   GFR: Estimated Creatinine Clearance: 32.4 mL/min (A) (by C-G formula based on SCr of 1.88 mg/dL (H)). Liver Function Tests: No results for input(s): AST, ALT, ALKPHOS, BILITOT, PROT, ALBUMIN in the last 168 hours. No results for input(s): LIPASE, AMYLASE in the last 168 hours. No results for input(s): AMMONIA in the last 168 hours. Coagulation Profile: No results for input(s): INR, PROTIME in the last 168 hours. Cardiac Enzymes: No results for input(s): CKTOTAL, CKMB, CKMBINDEX, TROPONINI in the last 168 hours. BNP (last 3 results) No results for input(s): PROBNP in the last 8760 hours. HbA1C: Recent Labs    08/30/17 0623  HGBA1C 6.6*   CBG: Recent Labs  Lab 08/29/17 1337 08/29/17 2315 08/30/17 0721 08/30/17 1159  GLUCAP 105* 308* 202* 100*   Lipid  Profile: Recent Labs    08/30/17 0623  CHOL 168  HDL 35*  LDLCALC 110*  TRIG 114  CHOLHDL 4.8   Thyroid Function Tests: Recent Labs    08/30/17 0623  TSH 0.948   Anemia Panel: Recent Labs    08/29/17 1816  FERRITIN 16  TIBC 337  IRON 26*   Sepsis Labs: No results for input(s): PROCALCITON, LATICACIDVEN in the last 168 hours.  No results found for this or any previous visit (from the past 240 hour(s)).       Radiology Studies: Dg Chest 2 View  Result Date: 08/29/2017 CLINICAL DATA:  Cough and body ache EXAM: CHEST  2 VIEW COMPARISON:  None. FINDINGS: No large effusion. Cardiomegaly with vascular congestion. Diffuse interstitial and mild alveolar opacity with more confluent opacity at the right base. Aortic atherosclerosis. No pneumothorax. IMPRESSION: 1. Cardiomegaly with vascular congestion. Diffuse interstitial opacities suggests underlying edema. 2. More confluent opacity at the right greater than left lung base could reflect superimposed pneumonia Electronically Signed   By: Donavan Foil M.D.   On: 08/29/2017 15:52   US Renal  Result Date: 08/29/2017 CLINICAL  DATA:  62 year old female with chronic kidney disease. EXAM: RENAL / URINARY TRACT ULTRASOUND COMPLETE COMPARISON:  None. FINDINGS: Right Kidney: Length: 9.1 cm. There is diffuse increased renal echogenicity. No hydronephrosis or shadowing stone. Mild renal pelviectasis. Left Kidney: Length: 11 cm. Minimally increased echogenicity. No hydronephrosis or shadowing stone. Bladder: Appears normal for degree of bladder distention. Trace left pleural effusion noted. IMPRESSION: Mildly atrophic and echogenic right kidney. Clinical correlation is recommended. No hydronephrosis or shadowing stone. Trace left pleural effusion. Electronically Signed   By: Anner Crete M.D.   On: 08/29/2017 22:39        Scheduled Meds: . aspirin EC  81 mg Oral Daily  . atorvastatin  40 mg Oral q1800  . citalopram  20 mg Oral Daily   . enoxaparin (LOVENOX) injection  30 mg Subcutaneous Q24H  . furosemide  40 mg Intravenous Q12H  . gabapentin  100 mg Oral TID  . insulin aspart  0-9 Units Subcutaneous TID WC  . insulin glargine  20 Units Subcutaneous BID  . ipratropium-albuterol  3 mL Nebulization Q6H  . labetalol  200 mg Oral BID  . loratadine  10 mg Oral Daily  . montelukast  10 mg Oral QHS  . nicotine  14 mg Transdermal Daily  . pneumococcal 23 valent vaccine  0.5 mL Intramuscular Tomorrow-1000  . potassium chloride  40 mEq Oral TID  . sodium chloride flush  3 mL Intravenous Q12H   Continuous Infusions: . sodium chloride    . levofloxacin (LEVAQUIN) IV Stopped (08/29/17 2203)     LOS: 1 day    Nelma Phagan Tanna Furry, MD [facility] Pager [phone]  If 7PM-7AM, please contact night-coverage [email or web address] Password [email or web address] 08/30/2017, 1:16 PM

## 2017-08-30 NOTE — Clinical Social Work Note (Signed)
CSW acknowledges consult regarding advanced directives. Please consult chaplain for this need.  CSW signing off. Consult again if any social work needs arise.  Brenda Sims, [place]

## 2017-08-30 NOTE — Evaluation (Signed)
Physical Therapy Evaluation Patient Details Name: Brenda Sims MRN: [id number] DOB: 1956/02/07 Today's Date: 08/30/2017   History of Present Illness  Pt is a 62 y.o. female admitted  08/29/17 with acute respiratory failure with hypoxia in the setting of CHF; may have left lower lung CAP. PMH includes HTN, DM.    Clinical Impression  Patient evaluated by Physical Therapy with no further acute PT needs identified. Pt currently independent with all mobility. SpO2 remained >90% on RA throughout treatment. Educ on activity pacing, energy conservation, and importance of continued mobility. All education has been completed and the patient has no further questions. Encouraged to continue ambulating during hospital stay (RN notified). PT is signing off. Thank you for this referral.    Follow Up Recommendations No PT follow up    Equipment Recommendations  None recommended by PT    Recommendations for Other Services       Precautions / Restrictions Precautions Precautions: None Precaution Comments: watch O2 Restrictions Weight Bearing Restrictions: No      Mobility  Bed Mobility Overal bed mobility: Independent                Transfers Overall transfer level: Independent                  Ambulation/Gait Ambulation/Gait assistance: Independent Ambulation Distance (Feet): 500 Feet Assistive device: None Gait Pattern/deviations: WFL(Within Functional Limits)        Stairs            Wheelchair Mobility    Modified Rankin (Stroke Patients Only)       Balance Overall balance assessment: Independent                                           Pertinent Vitals/Pain Pain Assessment: No/denies pain    Home Living Family/patient expects to be discharged to:: Private residence Living Arrangements: Spouse/significant other;Parent Available Help at Discharge: Family;Available PRN/intermittently Type of Home: House Home Access: Stairs to  enter;Ramped entrance Entrance Stairs-Rails: Right;Left;Can reach both Entrance Stairs-Number of Steps: 3 Home Layout: One level Home Equipment: Shower seat      Prior Function Level of Independence: Independent               Hand Dominance   Dominant Hand: Left    Extremity/Trunk Assessment   Upper Extremity Assessment Upper Extremity Assessment: Overall WFL for tasks assessed    Lower Extremity Assessment Lower Extremity Assessment: Overall WFL for tasks assessed    Cervical / Trunk Assessment Cervical / Trunk Assessment: Normal  Communication   Communication: No difficulties  Cognition Arousal/Alertness: Awake/alert Behavior During Therapy: WFL for tasks assessed/performed Overall Cognitive Status: Within Functional Limits for tasks assessed                                        General Comments General comments (skin integrity, edema, etc.): Pt's fiance present during session    Exercises     Assessment/Plan    PT Assessment Patent does not need any further PT services  PT Problem List         PT Treatment Interventions      PT Goals (Current goals can be found in the Care Plan section)  Acute Rehab PT Goals Patient Stated Goal: to go home PT  Goal Formulation: All assessment and education complete, DC therapy    Frequency     Barriers to discharge        Co-evaluation               AM-PAC PT "6 Clicks" Daily Activity  Outcome Measure Difficulty turning over in bed (including adjusting bedclothes, sheets and blankets)?: None Difficulty moving from lying on back to sitting on the side of the bed? : None Difficulty sitting down on and standing up from a chair with arms (e.g., wheelchair, bedside commode, etc,.)?: None Help needed moving to and from a bed to chair (including a wheelchair)?: None Help needed walking in hospital room?: None Help needed climbing 3-5 steps with a railing? : None 6 Click Score: 24    End  of Session Equipment Utilized During Treatment: Gait belt Activity Tolerance: Patient tolerated treatment well Patient left: in bed;with call bell/phone within reach;with family/visitor present Nurse Communication: Mobility status PT Visit Diagnosis: Other abnormalities of gait and mobility (R26.89)    Time: 9295-7473 PT Time Calculation (min) (ACUTE ONLY): 20 min   Charges:   PT Evaluation $PT Eval Low Complexity: 1 Low     PT G Codes:       Mabeline Caras, PT, DPT Acute Rehab Services  Pager: [place] 08/30/2017, 10:15 AM

## 2017-08-30 NOTE — Progress Notes (Signed)
OT Cancellation Note  Patient Details Name: Brenda Sims MRN: [id number] DOB: 05/13/1956   Cancelled Treatment:    Reason Eval/Treat Not Completed: Patient at procedure or test/ unavailable(Echo Lab). OT will continue to follow for eval as schedule allows.  [place] 08/30/2017, 9:21 AM  Hulda Humphrey OTR/L [phone]

## 2017-08-31 ENCOUNTER — Inpatient Hospital Stay (HOSPITAL_COMMUNITY): Payer: Medicaid Other

## 2017-08-31 ENCOUNTER — Encounter (HOSPITAL_COMMUNITY): Payer: Self-pay

## 2017-08-31 LAB — GLUCOSE, CAPILLARY
Glucose-Capillary: 92 mg/dL (ref 65–99)
Glucose-Capillary: 127 mg/dL — ABNORMAL HIGH (ref 65–99)
Glucose-Capillary: 105 mg/dL — ABNORMAL HIGH (ref 65–99)

## 2017-08-31 LAB — MAGNESIUM: MAGNESIUM: 1.9 mg/dL (ref 1.7–2.4)

## 2017-08-31 LAB — BASIC METABOLIC PANEL
Anion gap: 12 (ref 5–15)
BUN: 23 mg/dL — AB (ref 6–20)
CHLORIDE: 102 mmol/L (ref 101–111)
CO2: 25 mmol/L (ref 22–32)
CREATININE: 1.66 mg/dL — AB (ref 0.44–1.00)
Calcium: 8.3 mg/dL — ABNORMAL LOW (ref 8.9–10.3)
GFR calc Af Amer: 37 mL/min — ABNORMAL LOW (ref >60)
GFR calc non Af Amer: 32 mL/min — ABNORMAL LOW (ref >60)
Glucose, Bld: 132 mg/dL — ABNORMAL HIGH (ref 65–99)
Potassium: 3.3 mmol/L — ABNORMAL LOW (ref 3.5–5.1)
SODIUM: 139 mmol/L (ref 135–145)

## 2017-08-31 MED ORDER — ALPRAZOLAM 0.25 MG PO TABS
0.2500 mg | ORAL_TABLET | Freq: Once | ORAL | Status: AC
  Administered 2017-08-31: 0.25 mg via ORAL
  Filled 2017-08-31: qty 1

## 2017-08-31 MED ORDER — HYDRALAZINE HCL 50 MG PO TABS
50.0000 mg | ORAL_TABLET | Freq: Once | ORAL | Status: AC
  Administered 2017-08-31: 50 mg via ORAL
  Filled 2017-08-31: qty 1

## 2017-08-31 MED ORDER — DM-GUAIFENESIN ER 30-600 MG PO TB12
1.0000 | ORAL_TABLET | Freq: Two times a day (BID) | ORAL | Status: DC | PRN
  Administered 2017-08-31 – 2017-09-03 (×4): 1 via ORAL
  Filled 2017-08-31 (×4): qty 1

## 2017-08-31 MED ORDER — FUROSEMIDE 10 MG/ML IJ SOLN
40.0000 mg | Freq: Two times a day (BID) | INTRAMUSCULAR | Status: DC
  Administered 2017-08-31 – 2017-09-02 (×6): 40 mg via INTRAVENOUS
  Filled 2017-08-31 (×7): qty 4

## 2017-08-31 MED ORDER — HYDRALAZINE HCL 50 MG PO TABS
100.0000 mg | ORAL_TABLET | Freq: Two times a day (BID) | ORAL | Status: DC
  Administered 2017-08-31 – 2017-09-02 (×5): 100 mg via ORAL
  Filled 2017-08-31 (×5): qty 2

## 2017-08-31 MED ORDER — HYDROCODONE-ACETAMINOPHEN 5-325 MG PO TABS
1.0000 | ORAL_TABLET | Freq: Four times a day (QID) | ORAL | Status: DC | PRN
  Administered 2017-08-31 (×2): 2 via ORAL
  Administered 2017-09-01 – 2017-09-03 (×4): 1 via ORAL
  Filled 2017-08-31: qty 2
  Filled 2017-08-31 (×2): qty 1
  Filled 2017-08-31: qty 2
  Filled 2017-08-31: qty 1
  Filled 2017-08-31: qty 2
  Filled 2017-08-31: qty 1

## 2017-08-31 MED ORDER — ENOXAPARIN SODIUM 40 MG/0.4ML ~~LOC~~ SOLN
40.0000 mg | SUBCUTANEOUS | Status: DC
  Administered 2017-08-31 – 2017-09-02 (×3): 40 mg via SUBCUTANEOUS
  Filled 2017-08-31 (×3): qty 0.4

## 2017-08-31 NOTE — Progress Notes (Signed)
Pt reports that she has been taking Keppra 500mg  BID for seizures, pt states she feels that's been too strong of a dose. Maud Deed Tobias[place],RN 08/31/17 9:19 AM

## 2017-08-31 NOTE — Progress Notes (Signed)
PROGRESS NOTE    Brenda Sims  XLK:[id number] DOB: May 04, 1956 DOA: 08/29/2017 PCP: Patient, No Pcp Per   Brief Narrative: 62 y.o. female with medical history significant of hypertension, type 2 diabetes on metformin and Lantus at home, obesity, mild intermittent asthma, knee surgery, presented with shortness of breath, dyspnea on exertion and coughing worsening for last to 3 days.  Patient was moved from [place] to [place] about a month ago.  Patient and her husband reported that the symptoms started before moving here.  She was having coughing and gradually worsening dyspnea on exertion.  The symptoms gradually worsened to the point that she was having difficulty breathing.  She went to the urgent care where she was found to be hypoxic did not improve with the breathing treatment therefore directed her to the ER.  In the ER patient was found to be hypoxic, hypokalemia, elevated BNP and acute pulmonary edema on chest x-ray.  Treated with potassium chloride, Lasix 20 mg IV and admitted for further evaluation.  Assessment & Plan:  #Acute diastolic congestive heart failure/acute pulmonary edema/cardiomegaly -Patient presented with dyspnea on exertion, cough and found to have cardiomegaly, acute pulmonary edema and elevated BNP more than 1016.   -Treated with IV Lasix with some improvement.  Echo with EF of 50-55% and grade 2 diastolic dysfunction.  Patient also with dilated IVC.  She was some short of breath today.  Checking chest x-ray.  Switch back to IV Lasix.  Creatinine 1.6. -Continue aspirin, labetalol.   -No AACEI or ARB because of renal failure. -TSH level acceptable.  #Acute respiratory failure with hypoxia in the setting of CHF and may have left lower lung community-acquired pneumonia:  -Flu negative.  Adding oxygen today.  Continue Levaquin.  IV Lasix.  Continue to monitor.  #Elevated serum creatinine level likely chronic kidney disease versus a AKI in the setting of CHF: No  prior labs available to compare.  -Ultrasound of kidneys consistent with medical renal disease.  No sign of UTI.  Serum creatinine level 1.6 today.   Continue to monitor.  Repeat lab in the morning.  #Hypokalemia/hypomagnesemia: Continue to replete potassium chloride.  Magnesium level acceptable today.  Monitor lab.  #Microcytic anemia/iron deficiency anemia: Start oral iron. Monitor CBC.  #Essential hypertension: Pressure elevated.  Increase the dose of hydralazine.  Continue Lasix, labetalol. Case manager evaluation ongoing. Monitor blood pressure closely.  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  Continue current insulin regimen.  Monitor blood sugar level.  #Mild intermittent asthma with seasonal allergy: Continue Claritin.  #Tobacco abuse: Patient was counseled.  Nicotine patch ordered.  DVT prophylaxis: Lovenox subcutaneous Code Status: Full code Family Communication: No family at bedside Disposition Plan: Currently admitted.  Likely discharge home in 1-2 days..    Consultants:   None  Procedures: Echo Antimicrobials: Levaquin  Subjective: Seen and examined at bedside.  Reported cough and shortness of breath today.  No chest pain.  Requiring oxygen..  Objective: Vitals:   08/31/17 0100 08/31/17 0403 08/31/17 0829 08/31/17 0910  BP:  (!) 160/87  (!) 172/81  Pulse:  84 86 98  Resp:  (!) 22 20 20   Temp:  98.3 F (36.8 C)  98.2 F (36.8 C)  TempSrc:  Oral  Oral  SpO2: 96% 96% 94% 93%  Weight:  83.2 kg (183 lb 6.4 oz)    Height:        Intake/Output Summary (Last 24 hours) at 08/31/2017 1147 Last data filed at 08/31/2017 1106  Gross per 24 hour  Intake 1540 ml  Output 1000 ml  Net 540 ml   Filed Weights   08/29/17 1427 08/29/17 2217 08/31/17 0403  Weight: 83.9 kg (185 lb) 81.4 kg (179 lb 6.4 oz) 83.2 kg (183 lb 6.4 oz)    Examination:  General exam: Sitting in bed comfortable. Respiratory system: Basal rhonchi with occasional  wheeze.  Respiratory effort normal.  Cardiovascular system: Regular rate rhythm S1-S2 normal.  No pedal edema. Gastrointestinal system: Abdomen is nondistended, soft and nontender. Normal bowel sounds heard. Central nervous system: Alert and oriented. No focal neurological deficits. Skin: No rashes, lesions or ulcers Psychiatry: Judgement and insight appear normal. Mood & affect appropriate.     Data Reviewed: I have personally reviewed following labs and imaging studies  CBC: Recent Labs  Lab 08/29/17 1530 08/30/17 0623  WBC 12.7* 11.6*  NEUTROABS 9.2*  --   HGB 9.1* 8.7*  HCT 29.0* 27.8*  MCV 66.4* 66.5*  PLT 350 211   Basic Metabolic Panel: Recent Labs  Lab 08/29/17 1530 08/30/17 0623 08/31/17 0912  NA 144 142 139  K 2.9* 3.4* 3.3*  CL 101 100* 102  CO2 30 28 25   GLUCOSE 99 195* 132*  BUN 11 25* 23*  CREATININE 1.43* 1.88* 1.66*  CALCIUM 8.3* 8.2* 8.3*  MG  --  1.6* 1.9   GFR: Estimated Creatinine Clearance: 37.1 mL/min (A) (by C-G formula based on SCr of 1.66 mg/dL (H)). Liver Function Tests: No results for input(s): AST, ALT, ALKPHOS, BILITOT, PROT, ALBUMIN in the last 168 hours. No results for input(s): LIPASE, AMYLASE in the last 168 hours. No results for input(s): AMMONIA in the last 168 hours. Coagulation Profile: No results for input(s): INR, PROTIME in the last 168 hours. Cardiac Enzymes: No results for input(s): CKTOTAL, CKMB, CKMBINDEX, TROPONINI in the last 168 hours. BNP (last 3 results) No results for input(s): PROBNP in the last 8760 hours. HbA1C: Recent Labs    08/30/17 0623  HGBA1C 6.6*   CBG: Recent Labs  Lab 08/30/17 0721 08/30/17 1159 08/30/17 1704 08/30/17 2122 08/31/17 0726  GLUCAP 202* 100* 106* 124* 92   Lipid Profile: Recent Labs    08/30/17 0623  CHOL 168  HDL 35*  LDLCALC 110*  TRIG 114  CHOLHDL 4.8   Thyroid Function Tests: Recent Labs    08/30/17 0623  TSH 0.948   Anemia Panel: Recent Labs     08/29/17 1816  FERRITIN 16  TIBC 337  IRON 26*   Sepsis Labs: No results for input(s): PROCALCITON, LATICACIDVEN in the last 168 hours.  No results found for this or any previous visit (from the past 240 hour(s)).       Radiology Studies: Dg Chest 2 View  Result Date: 08/29/2017 CLINICAL DATA:  Cough and body ache EXAM: CHEST  2 VIEW COMPARISON:  None. FINDINGS: No large effusion. Cardiomegaly with vascular congestion. Diffuse interstitial and mild alveolar opacity with more confluent opacity at the right base. Aortic atherosclerosis. No pneumothorax. IMPRESSION: 1. Cardiomegaly with vascular congestion. Diffuse interstitial opacities suggests underlying edema. 2. More confluent opacity at the right greater than left lung base could reflect superimposed pneumonia Electronically Signed   By: Donavan Foil M.D.   On: 08/29/2017 15:52   US Renal  Result Date: 08/29/2017 CLINICAL DATA:  62 year old female with chronic kidney disease. EXAM: RENAL / URINARY TRACT ULTRASOUND COMPLETE COMPARISON:  None. FINDINGS: Right Kidney: Length: 9.1 cm. There is diffuse increased renal echogenicity. No hydronephrosis or  shadowing stone. Mild renal pelviectasis. Left Kidney: Length: 11 cm. Minimally increased echogenicity. No hydronephrosis or shadowing stone. Bladder: Appears normal for degree of bladder distention. Trace left pleural effusion noted. IMPRESSION: Mildly atrophic and echogenic right kidney. Clinical correlation is recommended. No hydronephrosis or shadowing stone. Trace left pleural effusion. Electronically Signed   By: Anner Crete M.D.   On: 08/29/2017 22:39        Scheduled Meds: . aspirin EC  81 mg Oral Daily  . atorvastatin  40 mg Oral q1800  . citalopram  20 mg Oral Daily  . enoxaparin (LOVENOX) injection  30 mg Subcutaneous Q24H  . ferrous sulfate  325 mg Oral BID WC  . furosemide  40 mg Oral BID  . gabapentin  100 mg Oral TID  . hydrALAZINE  100 mg Oral BID  .  hydrALAZINE  50 mg Oral Once  . insulin aspart  0-9 Units Subcutaneous TID WC  . insulin glargine  20 Units Subcutaneous BID  . ipratropium-albuterol  3 mL Nebulization Q6H  . labetalol  200 mg Oral BID  . loratadine  10 mg Oral Daily  . montelukast  10 mg Oral QHS  . nicotine  14 mg Transdermal Daily  . pneumococcal 23 valent vaccine  0.5 mL Intramuscular Tomorrow-1000  . potassium chloride  40 mEq Oral TID  . sodium chloride flush  3 mL Intravenous Q12H   Continuous Infusions: . sodium chloride    . levofloxacin (LEVAQUIN) IV Stopped (08/30/17 1901)     LOS: 2 days    Marieelena Bartko Tanna Furry, MD [facility] Pager [phone]  If 7PM-7AM, please contact night-coverage [email or web address] Password [facility] 08/31/2017, 11:47 AM

## 2017-08-31 NOTE — Progress Notes (Signed)
Pt c/o sob, oxygen saturation difficult to read due to acrylic nails. Pt appears to be controlling her breathing to influence the oxygen saturation; per RT her assessment is the same. Pt is putting out adequate urine on furosemide.   Pts O2 saturation rises when pt doesn't know its being measured, pt is able to speak full sentences both to staff when pulse ox is not on her hand and to her friends/family on the phone when staff walks out.   Pt is placed on a venturi mask by RT at 50%.   Dr Carolin Sicks made aware of situation, agrees to continue the furosemide plan with IV antibiotics.   Pt advised to not get up to [facility] or bathroom alone, it was suggested she call for help with any ambulating.

## 2017-09-01 ENCOUNTER — Inpatient Hospital Stay (HOSPITAL_COMMUNITY): Payer: Medicaid Other

## 2017-09-01 DIAGNOSIS — R0602 Shortness of breath: Secondary | ICD-10-CM

## 2017-09-01 DIAGNOSIS — J9601 Acute respiratory failure with hypoxia: Secondary | ICD-10-CM

## 2017-09-01 LAB — GLUCOSE, CAPILLARY
GLUCOSE-CAPILLARY: 92 mg/dL (ref 65–99)
GLUCOSE-CAPILLARY: 132 mg/dL — AB (ref 65–99)
GLUCOSE-CAPILLARY: 154 mg/dL — AB (ref 65–99)
Glucose-Capillary: 115 mg/dL — ABNORMAL HIGH (ref 65–99)
Glucose-Capillary: 191 mg/dL — ABNORMAL HIGH (ref 65–99)
Glucose-Capillary: 135 mg/dL — ABNORMAL HIGH (ref 65–99)

## 2017-09-01 LAB — RESPIRATORY PANEL BY PCR
Adenovirus: NOT DETECTED
Bordetella pertussis: NOT DETECTED
Chlamydophila pneumoniae: NOT DETECTED
Coronavirus 229E: NOT DETECTED
Coronavirus HKU1: NOT DETECTED
Coronavirus NL63: NOT DETECTED
Coronavirus OC43: NOT DETECTED
INFLUENZA A-RVPPCR: NOT DETECTED
INFLUENZA B-RVPPCR: NOT DETECTED
MYCOPLASMA PNEUMONIAE-RVPPCR: NOT DETECTED
Metapneumovirus: NOT DETECTED
PARAINFLUENZA VIRUS 1-RVPPCR: NOT DETECTED
PARAINFLUENZA VIRUS 3-RVPPCR: NOT DETECTED
PARAINFLUENZA VIRUS 4-RVPPCR: NOT DETECTED
Parainfluenza Virus 2: NOT DETECTED
RESPIRATORY SYNCYTIAL VIRUS-RVPPCR: NOT DETECTED
RHINOVIRUS / ENTEROVIRUS - RVPPCR: NOT DETECTED

## 2017-09-01 LAB — BASIC METABOLIC PANEL
Anion gap: 14 (ref 5–15)
BUN: 20 mg/dL (ref 6–20)
CALCIUM: 8 mg/dL — AB (ref 8.9–10.3)
CO2: 25 mmol/L (ref 22–32)
CREATININE: 1.56 mg/dL — AB (ref 0.44–1.00)
Chloride: 101 mmol/L (ref 101–111)
GFR calc Af Amer: 40 mL/min — ABNORMAL LOW (ref >60)
GFR calc non Af Amer: 35 mL/min — ABNORMAL LOW (ref >60)
Glucose, Bld: 106 mg/dL — ABNORMAL HIGH (ref 65–99)
Potassium: 3.2 mmol/L — ABNORMAL LOW (ref 3.5–5.1)
SODIUM: 140 mmol/L (ref 135–145)

## 2017-09-01 LAB — MAGNESIUM: Magnesium: 1.5 mg/dL — ABNORMAL LOW (ref 1.7–2.4)

## 2017-09-01 LAB — CBC
HCT: 25.9 % — ABNORMAL LOW (ref 36.0–46.0)
HEMOGLOBIN: 8.2 g/dL — AB (ref 12.0–15.0)
MCH: 21.1 pg — AB (ref 26.0–34.0)
MCHC: 31.7 g/dL (ref 30.0–36.0)
MCV: 66.8 fL — AB (ref 78.0–100.0)
PLATELETS: 348 10*3/uL (ref 150–400)
RBC: 3.88 MIL/uL (ref 3.87–5.11)
RDW: 22.4 % — ABNORMAL HIGH (ref 11.5–15.5)
WBC: 16.3 10*3/uL — ABNORMAL HIGH (ref 4.0–10.5)

## 2017-09-01 MED ORDER — AMOXICILLIN-POT CLAVULANATE 875-125 MG PO TABS
1.0000 | ORAL_TABLET | Freq: Two times a day (BID) | ORAL | Status: DC
  Administered 2017-09-01 – 2017-09-02 (×4): 1 via ORAL
  Filled 2017-09-01 (×5): qty 1

## 2017-09-01 MED ORDER — MAGNESIUM SULFATE 2 GM/50ML IV SOLN
2.0000 g | Freq: Once | INTRAVENOUS | Status: AC
  Administered 2017-09-01: 2 g via INTRAVENOUS
  Filled 2017-09-01: qty 50

## 2017-09-01 MED ORDER — LORAZEPAM 1 MG PO TABS
1.0000 mg | ORAL_TABLET | Freq: Two times a day (BID) | ORAL | Status: DC | PRN
  Administered 2017-09-01: 1 mg via ORAL
  Filled 2017-09-01: qty 1

## 2017-09-01 MED ORDER — LABETALOL HCL 300 MG PO TABS
300.0000 mg | ORAL_TABLET | Freq: Two times a day (BID) | ORAL | Status: DC
  Administered 2017-09-01 – 2017-09-02 (×3): 300 mg via ORAL
  Filled 2017-09-01 (×3): qty 1

## 2017-09-01 MED ORDER — LABETALOL HCL 100 MG PO TABS
100.0000 mg | ORAL_TABLET | Freq: Once | ORAL | Status: AC
  Administered 2017-09-01: 100 mg via ORAL
  Filled 2017-09-01: qty 1

## 2017-09-01 NOTE — Progress Notes (Signed)
Patient found drinking beverages provided by family. Reiterated importance of fluid restrictions and counting intake versus output. Pt expresses understanding.

## 2017-09-01 NOTE — Progress Notes (Signed)
PROGRESS NOTE    Brenda Sims  CHY:[id number] DOB: 01/07/1956 DOA: 08/29/2017 PCP: Patient, No Pcp Per   Brief Narrative: 62 y.o. female with medical history significant of hypertension, type 2 diabetes on metformin and Lantus at home, obesity, mild intermittent asthma, knee surgery, presented with shortness of breath, dyspnea on exertion and coughing worsening for last to 3 days.  Patient was moved from [place] to [place] about a month ago.  Patient and her husband reported that the symptoms started before moving here.  She was having coughing and gradually worsening dyspnea on exertion.  The symptoms gradually worsened to the point that she was having difficulty breathing.  She went to the urgent care where she was found to be hypoxic did not improve with the breathing treatment therefore directed her to the ER.  In the ER patient was found to be hypoxic, hypokalemia, elevated BNP and acute pulmonary edema on chest x-ray.  Treated with potassium chloride, Lasix 20 mg IV and admitted for further evaluation.  Assessment & Plan:  #Acute diastolic congestive heart failure/acute pulmonary edema/cardiomegaly -Patient presented with dyspnea on exertion, cough and found to have cardiomegaly, acute pulmonary edema and elevated BNP more than 1016.   -History of shortness of breath cough and requiring oxygen.  Plan to continue IV diuretics.  Check CT scan of chest to further evaluate.  Follow-up respiratory viral study. Echo with EF of 50-55% and grade 2 diastolic dysfunction.  Patient also with dilated IVC.   -Continue aspirin, labetalol.   -No AACEI or ARB because of renal failure. -TSH level acceptable.  #Acute respiratory failure with hypoxia in the setting of CHF and may have left lower lung community-acquired pneumonia:  -Flu negative.  Discontinue Levaquin because of prolonged QT.  Repeat an EKG today.  On Augmentin.  Continue diuretics.  Follow-up CT chest.  Continue  oxygen.  #Obstructive sleep apnea: Start CPAP at night.  Discussed with the patient to follow-up with PCP and pulmonologist for sleep study.  #Elevated serum creatinine level likely chronic kidney disease versus a AKI in the setting of CHF: No prior labs available to compare.  -Ultrasound of kidneys consistent with medical renal disease.  No sign of UTI.  Serum creatinine level stable.  Monitor BMP.  #Hypokalemia/hypomagnesemia: Replete magnesium sulfate and potassium chloride.  Repeat lab in the morning.    #Microcytic anemia/iron deficiency anemia: Start oral iron. Monitor CBC.  #Essential hypertension: Blood pressure elevated with tachycardia.  Increase the dose of labetalol to 300 twice daily.  Continue hydralazine, Lasix. Monitor blood pressure closely.  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  Continue current insulin regimen.  Monitor blood sugar level.  #Mild intermittent asthma with seasonal allergy: Continue Claritin.  #Tobacco abuse: Patient was counseled.  Nicotine patch ordered.  DVT prophylaxis: Lovenox subcutaneous Code Status: Full code Family Communication: No family at bedside Disposition Plan: Currently admitted.  Likely discharge home in 1-2 days..    Consultants:   None  Procedures: Echo Antimicrobials: Levaquin DC on 1/20 and started Augmentin.  Subjective: Seen and examined at bedside.  Still having cough, shortness of breath and requiring oxygen.  Not at baseline.  No headache dizziness.  Her husband at bedside. Objective: Vitals:   09/01/17 0610 09/01/17 0700 09/01/17 1153 09/01/17 1157  BP: (!) 152/86  (!) 121/106 (!) 150/71  Pulse: (!) 101 99    Resp: 18 20    Temp: 99.8 F (37.7 C)     TempSrc: Oral  SpO2: 94% 93% 96%   Weight: 83.7 kg (184 lb 8 oz)     Height:        Intake/Output Summary (Last 24 hours) at 09/01/2017 1305 Last data filed at 09/01/2017 0900 Gross per 24 hour  Intake 920 ml  Output 2351 ml   Net -1431 ml   Filed Weights   08/29/17 2217 08/31/17 0403 09/01/17 0610  Weight: 81.4 kg (179 lb 6.4 oz) 83.2 kg (183 lb 6.4 oz) 83.7 kg (184 lb 8 oz)    Examination:  General exam: Sitting on bed, not in distress Respiratory system: Bibasilar rhonchi with occasional wheeze.  Respiratory effort normal.  Cardiovascular system: Regular rate rhythm S1-S2 normal.  No pedal edema Gastrointestinal system: Abdomen is nondistended, soft and nontender. Normal bowel sounds heard. Central nervous system: Alert and oriented. No focal neurological deficits. Skin: No rashes, lesions or ulcers Psychiatry: Judgement and insight appear normal. Mood & affect appropriate.     Data Reviewed: I have personally reviewed following labs and imaging studies  CBC: Recent Labs  Lab 08/29/17 1530 08/30/17 0623 09/01/17 0756  WBC 12.7* 11.6* 16.3*  NEUTROABS 9.2*  --   --   HGB 9.1* 8.7* 8.2*  HCT 29.0* 27.8* 25.9*  MCV 66.4* 66.5* 66.8*  PLT 350 342 213   Basic Metabolic Panel: Recent Labs  Lab 08/29/17 1530 08/30/17 0623 08/31/17 0912 09/01/17 0756 09/01/17 1001  NA 144 142 139 140  --   K 2.9* 3.4* 3.3* 3.2*  --   CL 101 100* 102 101  --   CO2 30 28 25 25   --   GLUCOSE 99 195* 132* 106*  --   BUN 11 25* 23* 20  --   CREATININE 1.43* 1.88* 1.66* 1.56*  --   CALCIUM 8.3* 8.2* 8.3* 8.0*  --   MG  --  1.6* 1.9  --  1.5*   GFR: Estimated Creatinine Clearance: 39.6 mL/min (A) (by C-G formula based on SCr of 1.56 mg/dL (H)). Liver Function Tests: No results for input(s): AST, ALT, ALKPHOS, BILITOT, PROT, ALBUMIN in the last 168 hours. No results for input(s): LIPASE, AMYLASE in the last 168 hours. No results for input(s): AMMONIA in the last 168 hours. Coagulation Profile: No results for input(s): INR, PROTIME in the last 168 hours. Cardiac Enzymes: No results for input(s): CKTOTAL, CKMB, CKMBINDEX, TROPONINI in the last 168 hours. BNP (last 3 results) No results for input(s): PROBNP  in the last 8760 hours. HbA1C: Recent Labs    08/30/17 0623  HGBA1C 6.6*   CBG: Recent Labs  Lab 08/31/17 1133 08/31/17 1623 08/31/17 2054 09/01/17 0747 09/01/17 1123  GLUCAP 92 127* 105* 132* 115*   Lipid Profile: Recent Labs    08/30/17 0623  CHOL 168  HDL 35*  LDLCALC 110*  TRIG 114  CHOLHDL 4.8   Thyroid Function Tests: Recent Labs    08/30/17 0623  TSH 0.948   Anemia Panel: Recent Labs    08/29/17 1816  FERRITIN 16  TIBC 337  IRON 26*   Sepsis Labs: No results for input(s): PROCALCITON, LATICACIDVEN in the last 168 hours.  No results found for this or any previous visit (from the past 240 hour(s)).       Radiology Studies: Dg Chest Port 1 View  Result Date: 08/31/2017 CLINICAL DATA:  62 year old female with increased shortness of breath for several days. Recent cough and body ache. EXAM: PORTABLE CHEST 1 VIEW COMPARISON:  08/29/2017 FINDINGS: Portable AP upright view  at 1241 hours. Continued diffuse bilateral increased pulmonary interstitial opacity, obscuring the hilar contours. Underlying cardiomegaly. Trace pleural fluid again suspected in the fissures, but no other pleural effusion is evident. Calcified aortic atherosclerosis. No pneumothorax. Associated bibasilar hypo ventilation appears stable without definite consolidation. IMPRESSION: 1. Moderate to severe bilateral pulmonary interstitial opacity has not improved since 08/29/2017 and is nonspecific. Top differential considerations include acute pulmonary edema (slightly favored due to cardiomegaly and trace pleural fluid), viral/atypical respiratory infection, and chronic interstitial lung disease. 2. Cardiomegaly.  Aortic Atherosclerosis (ICD10-I70.0). Electronically Signed   By: Genevie Ann M.D.   On: 08/31/2017 14:40        Scheduled Meds: . amoxicillin-clavulanate  1 tablet Oral Q12H  . aspirin EC  81 mg Oral Daily  . atorvastatin  40 mg Oral q1800  . citalopram  20 mg Oral Daily  .  enoxaparin (LOVENOX) injection  40 mg Subcutaneous Q24H  . ferrous sulfate  325 mg Oral BID WC  . furosemide  40 mg Intravenous Q12H  . gabapentin  100 mg Oral TID  . hydrALAZINE  100 mg Oral BID  . insulin aspart  0-9 Units Subcutaneous TID WC  . insulin glargine  20 Units Subcutaneous BID  . ipratropium-albuterol  3 mL Nebulization Q6H  . labetalol  100 mg Oral Once  . labetalol  300 mg Oral BID  . loratadine  10 mg Oral Daily  . montelukast  10 mg Oral QHS  . nicotine  14 mg Transdermal Daily  . pneumococcal 23 valent vaccine  0.5 mL Intramuscular Tomorrow-1000  . potassium chloride  40 mEq Oral TID  . sodium chloride flush  3 mL Intravenous Q12H   Continuous Infusions: . sodium chloride    . magnesium sulfate 1 - 4 g bolus IVPB       LOS: 3 days    Starnisha Batrez Tanna Furry, MD [facility] Pager [phone]  If 7PM-7AM, please contact night-coverage [email or web address] Password [email or web address] 09/01/2017, 1:05 PM
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
## 2017-09-01 NOTE — Progress Notes (Signed)
Pt placed on CPAP of 9cmH20 with FFM and 3L O2 bled in. Pt tolerating at this time. RT Will continue to monitor.

## 2017-09-01 NOTE — Progress Notes (Signed)
Pt states has used c-pap in the past for OSA, may need it again while in the hospital or scheldule a sleep study out pt, Thanks Arvella Nigh RN

## 2017-09-01 NOTE — Progress Notes (Signed)
Pt had a run of wide QRS, pt was SOB on exertion, RN gave breathing to help with wheezing and SOB, will continue to monitor, Thanks Arvella Nigh RN

## 2017-09-02 LAB — GLUCOSE, CAPILLARY
GLUCOSE-CAPILLARY: 106 mg/dL — AB (ref 65–99)
GLUCOSE-CAPILLARY: 131 mg/dL — AB (ref 65–99)
Glucose-Capillary: 96 mg/dL (ref 65–99)
Glucose-Capillary: 168 mg/dL — ABNORMAL HIGH (ref 65–99)

## 2017-09-02 LAB — BASIC METABOLIC PANEL
Anion gap: 12 (ref 5–15)
BUN: 23 mg/dL — AB (ref 6–20)
CHLORIDE: 103 mmol/L (ref 101–111)
CO2: 24 mmol/L (ref 22–32)
Calcium: 8.2 mg/dL — ABNORMAL LOW (ref 8.9–10.3)
Creatinine, Ser: 1.79 mg/dL — ABNORMAL HIGH (ref 0.44–1.00)
GFR calc Af Amer: 34 mL/min — ABNORMAL LOW (ref >60)
GFR calc non Af Amer: 29 mL/min — ABNORMAL LOW (ref >60)
GLUCOSE: 118 mg/dL — AB (ref 65–99)
POTASSIUM: 4.5 mmol/L (ref 3.5–5.1)
Sodium: 139 mmol/L (ref 135–145)

## 2017-09-02 LAB — MAGNESIUM: MAGNESIUM: 1.9 mg/dL (ref 1.7–2.4)

## 2017-09-02 MED ORDER — ALPRAZOLAM 0.5 MG PO TABS
0.5000 mg | ORAL_TABLET | Freq: Two times a day (BID) | ORAL | Status: DC
  Administered 2017-09-02 (×2): 0.5 mg via ORAL
  Filled 2017-09-02 (×2): qty 1

## 2017-09-02 NOTE — Progress Notes (Signed)
PROGRESS NOTE    Brenda Sims  BSJ:[id number] DOB: Jul 10, 1956 DOA: 08/29/2017 PCP: Patient, No Pcp Per   Brief Narrative: 62 y.o. female with medical history significant of hypertension, type 2 diabetes on metformin and Lantus at home, obesity, mild intermittent asthma, knee surgery, presented with shortness of breath, dyspnea on exertion and coughing worsening for last to 3 days.  Patient was moved from [place] to [place] about a month ago.  Patient and her husband reported that the symptoms started before moving here.  She was having coughing and gradually worsening dyspnea on exertion.  The symptoms gradually worsened to the point that she was having difficulty breathing.  She went to the urgent care where she was found to be hypoxic did not improve with the breathing treatment therefore directed her to the ER.  In the ER patient was found to be hypoxic, hypokalemia, elevated BNP and acute pulmonary edema on chest x-ray.  Treated with potassium chloride, Lasix 20 mg IV and admitted for further evaluation.  Assessment & Plan:  #Acute diastolic congestive heart failure/acute pulmonary edema/cardiomegaly -Patient presented with dyspnea on exertion, cough and found to have cardiomegaly, acute pulmonary edema and elevated BNP more than 1016.   -Respiratory viral study negative. -CT chest consistent with pulmonary edema. Echo with EF of 50-55% and grade 2 diastolic dysfunction.  Patient also with dilated IVC.   -Plan to continue IV Lasix twice a day.  Monitor urine output.  Monitor BMP.  Patient is noncompliant with fluid restriction, education provided. -Continue aspirin, labetalol.   -No AACEI or ARB because of renal failure. -TSH level acceptable.  #Acute respiratory failure with hypoxia in the setting of CHF and may have left lower lung community-acquired pneumonia:  -Currently on Lasix.  Patient with shallow breathing and looks anxious.  Patient reported that she used to take Xanax 1  mg twice a day in the past for her anxiety.  I am going to try lower dose of Xanax today to see if this helps her anxiety and breathing. -Flu/RSV negative.  Discontinue Levaquin because of prolonged QT.  On Augmentin.  Continue diuretics.    #Obstructive sleep apnea: Continue CPAP at night.  Discussed with the patient to follow-up with PCP and pulmonologist for sleep study.  #Elevated serum creatinine level likely chronic kidney disease versus a AKI in the setting of CHF: No prior labs available to compare.  -Ultrasound of kidneys consistent with medical renal disease.  No sign of UTI.  Serum creatinine level stable.  Monitor BMP.  #Hypokalemia/hypomagnesemia: Replete magnesium sulfate and potassium chloride.  Continue to monitor  #Microcytic anemia/iron deficiency anemia: Start oral iron. Monitor CBC.  #Essential hypertension: continue current medication.  On Lasix IV, hydralazine, labetalol.    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  Continue current insulin regimen.  Monitor blood sugar level.  #Mild intermittent asthma with seasonal allergy: Continue Claritin.  #Tobacco abuse: Patient was counseled.  Nicotine patch ordered.  DVT prophylaxis: Lovenox subcutaneous Code Status: Full code Family Communication: Patient's husband at bedside. Disposition Plan: Currently admitted.  Likely discharge home in 1-2 days..    Consultants:   None  Procedures: Echo Antimicrobials: Levaquin DC on 1/20 and started Augmentin.  Subjective: Seen and examined at bedside.  Patient reported anxious and no significant improvement in shortness of breath and cough.  No chest pain.  Denied nausea vomiting or headache.  Objective: Vitals:   09/02/17 0538 09/02/17 0600 09/02/17 0611 09/02/17 0821  BP: (!) 166/72  Pulse: 82     Resp: 20     Temp: 98.5 F (36.9 C)     TempSrc: Oral     SpO2: 100% 100% 100% 93%  Weight: 83.9 kg (185 lb)     Height:         Intake/Output Summary (Last 24 hours) at 09/02/2017 1131 Last data filed at 09/02/2017 0600 Gross per 24 hour  Intake 650 ml  Output 1125 ml  Net -475 ml   Filed Weights   08/31/17 0403 09/01/17 0610 09/02/17 0538  Weight: 83.2 kg (183 lb 6.4 oz) 83.7 kg (184 lb 8 oz) 83.9 kg (185 lb)    Examination:  General exam: Sitting on bed, not in distress, looks anxious Respiratory system: Bilateral basal crackles with wheezing, no increased work of breathing. Cardiovascular system: Regular rate rhythm S1-S2 normal.  No pedal edema. Gastrointestinal system: Abdomen soft, nontender.  Nondistended.  Bowel sounds positive. Central nervous system: Alert and oriented. No focal neurological deficits. Skin: No rashes, lesions or ulcers Psychiatry: Judgement and insight appear normal. Mood & affect appropriate.     Data Reviewed: I have personally reviewed following labs and imaging studies  CBC: Recent Labs  Lab 08/29/17 1530 08/30/17 0623 09/01/17 0756  WBC 12.7* 11.6* 16.3*  NEUTROABS 9.2*  --   --   HGB 9.1* 8.7* 8.2*  HCT 29.0* 27.8* 25.9*  MCV 66.4* 66.5* 66.8*  PLT 350 342 295   Basic Metabolic Panel: Recent Labs  Lab 08/29/17 1530 08/30/17 0623 08/31/17 0912 09/01/17 0756 09/01/17 1001 09/02/17 0437  NA 144 142 139 140  --  139  K 2.9* 3.4* 3.3* 3.2*  --  4.5  CL 101 100* 102 101  --  103  CO2 30 28 25 25   --  24  GLUCOSE 99 195* 132* 106*  --  118*  BUN 11 25* 23* 20  --  23*  CREATININE 1.43* 1.88* 1.66* 1.56*  --  1.79*  CALCIUM 8.3* 8.2* 8.3* 8.0*  --  8.2*  MG  --  1.6* 1.9  --  1.5* 1.9   GFR: Estimated Creatinine Clearance: 34.6 mL/min (A) (by C-G formula based on SCr of 1.79 mg/dL (H)). Liver Function Tests: No results for input(s): AST, ALT, ALKPHOS, BILITOT, PROT, ALBUMIN in the last 168 hours. No results for input(s): LIPASE, AMYLASE in the last 168 hours. No results for input(s): AMMONIA in the last 168 hours. Coagulation Profile: No results for  input(s): INR, PROTIME in the last 168 hours. Cardiac Enzymes: No results for input(s): CKTOTAL, CKMB, CKMBINDEX, TROPONINI in the last 168 hours. BNP (last 3 results) No results for input(s): PROBNP in the last 8760 hours. HbA1C: No results for input(s): HGBA1C in the last 72 hours. CBG: Recent Labs  Lab 09/01/17 1123 09/01/17 1713 09/01/17 2036 09/01/17 2216 09/02/17 0728  GLUCAP 115* 191* 135* 154* 96   Lipid Profile: No results for input(s): CHOL, HDL, LDLCALC, TRIG, CHOLHDL, LDLDIRECT in the last 72 hours. Thyroid Function Tests: No results for input(s): TSH, T4TOTAL, FREET4, T3FREE, THYROIDAB in the last 72 hours. Anemia Panel: No results for input(s): VITAMINB12, FOLATE, FERRITIN, TIBC, IRON, RETICCTPCT in the last 72 hours. Sepsis Labs: No results for input(s): PROCALCITON, LATICACIDVEN in the last 168 hours.  Recent Results (from the past 240 hour(s))  Respiratory Panel by PCR     Status: None   Collection Time: 09/01/17 11:10 AM  Result Value Ref Range Status   Adenovirus NOT DETECTED NOT DETECTED Final  Coronavirus 229E NOT DETECTED NOT DETECTED Final   Coronavirus HKU1 NOT DETECTED NOT DETECTED Final   Coronavirus NL63 NOT DETECTED NOT DETECTED Final   Coronavirus OC43 NOT DETECTED NOT DETECTED Final   Metapneumovirus NOT DETECTED NOT DETECTED Final   Rhinovirus / Enterovirus NOT DETECTED NOT DETECTED Final   Influenza A NOT DETECTED NOT DETECTED Final   Influenza B NOT DETECTED NOT DETECTED Final   Parainfluenza Virus 1 NOT DETECTED NOT DETECTED Final   Parainfluenza Virus 2 NOT DETECTED NOT DETECTED Final   Parainfluenza Virus 3 NOT DETECTED NOT DETECTED Final   Parainfluenza Virus 4 NOT DETECTED NOT DETECTED Final   Respiratory Syncytial Virus NOT DETECTED NOT DETECTED Final   Bordetella pertussis NOT DETECTED NOT DETECTED Final   Chlamydophila pneumoniae NOT DETECTED NOT DETECTED Final   Mycoplasma pneumoniae NOT DETECTED NOT DETECTED Final          Radiology Studies: Ct Chest Wo Contrast  Result Date: 09/01/2017 CLINICAL DATA:  Pt has SOB and a very bad cough. She is on 3 lt of oxygen and could still not breath well EXAM: CT CHEST WITHOUT CONTRAST TECHNIQUE: Multidetector CT imaging of the chest was performed following the standard protocol without IV contrast. COMPARISON:  Chest radiograph 08/31/2017 FINDINGS: Cardiovascular: Coronary artery calcification and aortic atherosclerotic calcification. Mediastinum/Nodes: No axillary or supraclavicular adenopathy. Enlarged mediastinal lymph nodes. RIGHT lower paratracheal lymph node measures 14 mm. Subcarinal lymph node measures 23 mm. Lungs/Pleura: Extensive ground-glass opacities and mosaic pattern in the upper lobes predominantly. No pleural fluid. No pneumothorax. Airways normal. Upper Abdomen: Limited view of the liver, kidneys, pancreas are unremarkable. Normal adrenal glands. Musculoskeletal: No aggressive osseous lesion. IMPRESSION: 1. Severe ground-glass opacities in upper lobe mosaic pattern most consistent with pulmonary edema. 2. Mediastinal lymphadenopathy is favored reactive. Electronically Signed   By: Suzy Bouchard M.D.   On: 09/01/2017 18:53   Dg Chest Port 1 View  Result Date: 08/31/2017 CLINICAL DATA:  62 year old female with increased shortness of breath for several days. Recent cough and body ache. EXAM: PORTABLE CHEST 1 VIEW COMPARISON:  08/29/2017 FINDINGS: Portable AP upright view at 1241 hours. Continued diffuse bilateral increased pulmonary interstitial opacity, obscuring the hilar contours. Underlying cardiomegaly. Trace pleural fluid again suspected in the fissures, but no other pleural effusion is evident. Calcified aortic atherosclerosis. No pneumothorax. Associated bibasilar hypo ventilation appears stable without definite consolidation. IMPRESSION: 1. Moderate to severe bilateral pulmonary interstitial opacity has not improved since 08/29/2017 and is nonspecific.  Top differential considerations include acute pulmonary edema (slightly favored due to cardiomegaly and trace pleural fluid), viral/atypical respiratory infection, and chronic interstitial lung disease. 2. Cardiomegaly.  Aortic Atherosclerosis (ICD10-I70.0). Electronically Signed   By: Genevie Ann M.D.   On: 08/31/2017 14:40        Scheduled Meds: . ALPRAZolam  0.5 mg Oral BID  . amoxicillin-clavulanate  1 tablet Oral Q12H  . aspirin EC  81 mg Oral Daily  . atorvastatin  40 mg Oral q1800  . citalopram  20 mg Oral Daily  . enoxaparin (LOVENOX) injection  40 mg Subcutaneous Q24H  . ferrous sulfate  325 mg Oral BID WC  . furosemide  40 mg Intravenous Q12H  . gabapentin  100 mg Oral TID  . hydrALAZINE  100 mg Oral BID  . insulin aspart  0-9 Units Subcutaneous TID WC  . insulin glargine  20 Units Subcutaneous BID  . ipratropium-albuterol  3 mL Nebulization Q6H  . labetalol  300 mg Oral BID  .  loratadine  10 mg Oral Daily  . montelukast  10 mg Oral QHS  . nicotine  14 mg Transdermal Daily  . pneumococcal 23 valent vaccine  0.5 mL Intramuscular Tomorrow-1000  . potassium chloride  40 mEq Oral TID  . sodium chloride flush  3 mL Intravenous Q12H   Continuous Infusions: . sodium chloride       LOS: 4 days    Dron Tanna Furry, MD [facility] Pager [phone]  If 7PM-7AM, please contact night-coverage [email or web address] Password [facility] 09/02/2017, 11:31 AM

## 2017-09-03 ENCOUNTER — Inpatient Hospital Stay (HOSPITAL_COMMUNITY): Payer: Medicaid Other

## 2017-09-03 DIAGNOSIS — Z8674 Personal history of sudden cardiac arrest: Secondary | ICD-10-CM | POA: Diagnosis present

## 2017-09-03 DIAGNOSIS — I5031 Acute diastolic (congestive) heart failure: Secondary | ICD-10-CM

## 2017-09-03 DIAGNOSIS — G9340 Encephalopathy, unspecified: Secondary | ICD-10-CM

## 2017-09-03 DIAGNOSIS — I469 Cardiac arrest, cause unspecified: Secondary | ICD-10-CM

## 2017-09-03 DIAGNOSIS — I5033 Acute on chronic diastolic (congestive) heart failure: Secondary | ICD-10-CM

## 2017-09-03 LAB — POCT I-STAT 3, ART BLOOD GAS (G3+)
Acid-Base Excess: 2 mmol/L (ref 0.0–2.0)
Acid-base deficit: 3 mmol/L — ABNORMAL HIGH (ref 0.0–2.0)
Acid-base deficit: 2 mmol/L (ref 0.0–2.0)
BICARBONATE: 25.1 mmol/L (ref 20.0–28.0)
BICARBONATE: 26.1 mmol/L (ref 20.0–28.0)
BICARBONATE: 27.6 mmol/L (ref 20.0–28.0)
Bicarbonate: 24.2 mmol/L (ref 20.0–28.0)
O2 Saturation: 92 %
O2 Saturation: 94 %
O2 Saturation: 100 %
O2 Saturation: 99 %
PCO2 ART: 56.2 mmHg — AB (ref 32.0–48.0)
PCO2 ART: 46.9 mmHg (ref 32.0–48.0)
PH ART: 7.446 (ref 7.350–7.450)
Patient temperature: 35.4
Patient temperature: 36.1
TCO2: 27 mmol/L (ref 22–32)
TCO2: 28 mmol/L (ref 22–32)
TCO2: 29 mmol/L (ref 22–32)
TCO2: 25 mmol/L (ref 22–32)
pCO2 arterial: 60.6 mmHg — ABNORMAL HIGH (ref 32.0–48.0)
pCO2 arterial: 34.8 mmHg (ref 32.0–48.0)
pH, Arterial: 7.226 — ABNORMAL LOW (ref 7.350–7.450)
pH, Arterial: 7.266 — ABNORMAL LOW (ref 7.350–7.450)
pH, Arterial: 7.373 (ref 7.350–7.450)
pO2, Arterial: 76 mmHg — ABNORMAL LOW (ref 83.0–108.0)
pO2, Arterial: 78 mmHg — ABNORMAL LOW (ref 83.0–108.0)
pO2, Arterial: 432 mmHg — ABNORMAL HIGH (ref 83.0–108.0)
pO2, Arterial: 153 mmHg — ABNORMAL HIGH (ref 83.0–108.0)

## 2017-09-03 LAB — BASIC METABOLIC PANEL
ANION GAP: 15 (ref 5–15)
Anion gap: 11 (ref 5–15)
Anion gap: 15 (ref 5–15)
Anion gap: 17 — ABNORMAL HIGH (ref 5–15)
BUN: 36 mg/dL — ABNORMAL HIGH (ref 6–20)
BUN: 37 mg/dL — AB (ref 6–20)
BUN: 41 mg/dL — AB (ref 6–20)
BUN: 46 mg/dL — AB (ref 6–20)
CALCIUM: 8.4 mg/dL — AB (ref 8.9–10.3)
CALCIUM: 8.4 mg/dL — AB (ref 8.9–10.3)
CHLORIDE: 104 mmol/L (ref 101–111)
CO2: 21 mmol/L — AB (ref 22–32)
CO2: 23 mmol/L (ref 22–32)
CO2: 22 mmol/L (ref 22–32)
CO2: 21 mmol/L — ABNORMAL LOW (ref 22–32)
CREATININE: 2.07 mg/dL — AB (ref 0.44–1.00)
CREATININE: 2.04 mg/dL — AB (ref 0.44–1.00)
Calcium: 8.6 mg/dL — ABNORMAL LOW (ref 8.9–10.3)
Calcium: 8.5 mg/dL — ABNORMAL LOW (ref 8.9–10.3)
Chloride: 107 mmol/L (ref 101–111)
Chloride: 104 mmol/L (ref 101–111)
Chloride: 105 mmol/L (ref 101–111)
Creatinine, Ser: 2.11 mg/dL — ABNORMAL HIGH (ref 0.44–1.00)
Creatinine, Ser: 2.33 mg/dL — ABNORMAL HIGH (ref 0.44–1.00)
GFR calc Af Amer: 29 mL/min — ABNORMAL LOW (ref >60)
GFR calc Af Amer: 25 mL/min — ABNORMAL LOW (ref >60)
GFR calc non Af Amer: 24 mL/min — ABNORMAL LOW (ref >60)
GFR calc non Af Amer: 25 mL/min — ABNORMAL LOW (ref >60)
GFR, EST AFRICAN AMERICAN: 28 mL/min — AB (ref >60)
GFR, EST AFRICAN AMERICAN: 29 mL/min — AB (ref >60)
GFR, EST NON AFRICAN AMERICAN: 25 mL/min — AB (ref >60)
GFR, EST NON AFRICAN AMERICAN: 21 mL/min — AB (ref >60)
GLUCOSE: 156 mg/dL — AB (ref 65–99)
GLUCOSE: 100 mg/dL — AB (ref 65–99)
GLUCOSE: 134 mg/dL — AB (ref 65–99)
Glucose, Bld: 234 mg/dL — ABNORMAL HIGH (ref 65–99)
POTASSIUM: 5.9 mmol/L — AB (ref 3.5–5.1)
POTASSIUM: 6.5 mmol/L — AB (ref 3.5–5.1)
Potassium: 6 mmol/L — ABNORMAL HIGH (ref 3.5–5.1)
Potassium: 4.2 mmol/L (ref 3.5–5.1)
SODIUM: 141 mmol/L (ref 135–145)
SODIUM: 143 mmol/L (ref 135–145)
Sodium: 140 mmol/L (ref 135–145)
Sodium: 141 mmol/L (ref 135–145)

## 2017-09-03 LAB — CBC WITH DIFFERENTIAL/PLATELET
BASOS PCT: 0 %
Basophils Absolute: 0 10*3/uL (ref 0.0–0.1)
EOS ABS: 0 10*3/uL (ref 0.0–0.7)
Eosinophils Relative: 0 %
HCT: 23.7 % — ABNORMAL LOW (ref 36.0–46.0)
HEMOGLOBIN: 7.4 g/dL — AB (ref 12.0–15.0)
LYMPHS PCT: 7 %
Lymphs Abs: 1.2 10*3/uL (ref 0.7–4.0)
MCH: 20.8 pg — AB (ref 26.0–34.0)
MCHC: 31.2 g/dL (ref 30.0–36.0)
MCV: 66.6 fL — AB (ref 78.0–100.0)
Monocytes Absolute: 0.9 10*3/uL (ref 0.1–1.0)
Monocytes Relative: 5 %
NEUTROS ABS: 15.5 10*3/uL — AB (ref 1.7–7.7)
Neutrophils Relative %: 88 %
Platelets: 317 10*3/uL (ref 150–400)
RBC: 3.56 MIL/uL — ABNORMAL LOW (ref 3.87–5.11)
RDW: 22 % — ABNORMAL HIGH (ref 11.5–15.5)
WBC: 17.6 10*3/uL — ABNORMAL HIGH (ref 4.0–10.5)

## 2017-09-03 LAB — ECHOCARDIOGRAM LIMITED
FS: 20 % — AB (ref 28–44)
Height: 64 in
IV/PV OW: 0.71
LA ID, A-P, ES: 48 mm
LA diam end sys: 48 mm
LADIAMINDEX: 2.42 cm/m2
LV PW d: 17 mm — AB (ref 0.6–1.1)
LV sys vol index: 37 mL/m2
LV sys vol: 74 mL — AB (ref 14–42)
LVDIAVOL: 139 mL — AB (ref 46–106)
LVDIAVOLIN: 70 mL/m2
Simpson's disk: 47
Stroke v: 65 ml
WEIGHTICAEL: 2984.15 [oz_av]

## 2017-09-03 LAB — PROTIME-INR
INR: 1.3
PROTHROMBIN TIME: 16 s — AB (ref 11.4–15.2)

## 2017-09-03 LAB — GLUCOSE, CAPILLARY
GLUCOSE-CAPILLARY: 120 mg/dL — AB (ref 65–99)
GLUCOSE-CAPILLARY: 116 mg/dL — AB (ref 65–99)
GLUCOSE-CAPILLARY: 163 mg/dL — AB (ref 65–99)
Glucose-Capillary: 215 mg/dL — ABNORMAL HIGH (ref 65–99)
Glucose-Capillary: 158 mg/dL — ABNORMAL HIGH (ref 65–99)
Glucose-Capillary: 132 mg/dL — ABNORMAL HIGH (ref 65–99)
Glucose-Capillary: 140 mg/dL — ABNORMAL HIGH (ref 65–99)

## 2017-09-03 LAB — CORTISOL: Cortisol, Plasma: 31 ug/dL

## 2017-09-03 LAB — LACTIC ACID, PLASMA: LACTIC ACID, VENOUS: 0.8 mmol/L (ref 0.5–1.9)

## 2017-09-03 LAB — RAPID URINE DRUG SCREEN, HOSP PERFORMED
Amphetamines: NOT DETECTED
Barbiturates: NOT DETECTED
Benzodiazepines: POSITIVE — AB
Cocaine: NOT DETECTED
Opiates: POSITIVE — AB
Tetrahydrocannabinol: NOT DETECTED

## 2017-09-03 LAB — TRIGLYCERIDES: Triglycerides: 85 mg/dL (ref <150)

## 2017-09-03 LAB — PHOSPHORUS
Phosphorus: 6.3 mg/dL — ABNORMAL HIGH (ref 2.5–4.6)
Phosphorus: 6.1 mg/dL — ABNORMAL HIGH (ref 2.5–4.6)

## 2017-09-03 LAB — HEPATIC FUNCTION PANEL
ALK PHOS: 84 U/L (ref 38–126)
ALT: 30 U/L (ref 14–54)
AST: 58 U/L — ABNORMAL HIGH (ref 15–41)
Albumin: 2.6 g/dL — ABNORMAL LOW (ref 3.5–5.0)
BILIRUBIN INDIRECT: 0.4 mg/dL (ref 0.3–0.9)
Bilirubin, Direct: 0.2 mg/dL (ref 0.1–0.5)
Total Bilirubin: 0.6 mg/dL (ref 0.3–1.2)
Total Protein: 6.4 g/dL — ABNORMAL LOW (ref 6.5–8.1)

## 2017-09-03 LAB — MRSA PCR SCREENING: MRSA by PCR: NEGATIVE

## 2017-09-03 LAB — TSH: TSH: 2.308 u[IU]/mL (ref 0.350–4.500)

## 2017-09-03 LAB — APTT: aPTT: 41 seconds — ABNORMAL HIGH (ref 24–36)

## 2017-09-03 LAB — PROCALCITONIN: Procalcitonin: 0.74 ng/mL

## 2017-09-03 LAB — T4, FREE: FREE T4: 1.01 ng/dL (ref 0.61–1.12)

## 2017-09-03 LAB — MAGNESIUM: Magnesium: 2 mg/dL (ref 1.7–2.4)

## 2017-09-03 MED ORDER — HEPARIN SODIUM (PORCINE) 5000 UNIT/ML IJ SOLN
5000.0000 [IU] | Freq: Three times a day (TID) | INTRAMUSCULAR | Status: DC
  Administered 2017-09-03 – 2017-09-09 (×18): 5000 [IU] via SUBCUTANEOUS
  Filled 2017-09-03 (×18): qty 1

## 2017-09-03 MED ORDER — SODIUM CHLORIDE 0.9 % IV SOLN
0.0000 ug/h | INTRAVENOUS | Status: DC
  Filled 2017-09-03: qty 50

## 2017-09-03 MED ORDER — FENTANYL CITRATE (PF) 100 MCG/2ML IJ SOLN
INTRAMUSCULAR | Status: AC
  Administered 2017-09-03: 100 ug via INTRAVENOUS
  Filled 2017-09-03: qty 2

## 2017-09-03 MED ORDER — INSULIN ASPART 100 UNIT/ML ~~LOC~~ SOLN
2.0000 [IU] | SUBCUTANEOUS | Status: DC
  Administered 2017-09-03 (×2): 2 [IU] via SUBCUTANEOUS
  Administered 2017-09-03 (×2): 4 [IU] via SUBCUTANEOUS
  Administered 2017-09-04 (×4): 2 [IU] via SUBCUTANEOUS
  Administered 2017-09-04 – 2017-09-05 (×4): 4 [IU] via SUBCUTANEOUS
  Administered 2017-09-05: 6 [IU] via SUBCUTANEOUS
  Administered 2017-09-05 – 2017-09-06 (×4): 4 [IU] via SUBCUTANEOUS

## 2017-09-03 MED ORDER — HYDRALAZINE HCL 20 MG/ML IJ SOLN
20.0000 mg | INTRAMUSCULAR | Status: DC | PRN
  Administered 2017-09-03 – 2017-09-06 (×7): 20 mg via INTRAVENOUS
  Filled 2017-09-03 (×7): qty 1

## 2017-09-03 MED ORDER — GABAPENTIN 250 MG/5ML PO SOLN
100.0000 mg | Freq: Three times a day (TID) | ORAL | Status: DC
  Administered 2017-09-03 – 2017-09-05 (×9): 100 mg
  Filled 2017-09-03 (×13): qty 2

## 2017-09-03 MED ORDER — METOLAZONE 5 MG PO TABS
10.0000 mg | ORAL_TABLET | Freq: Once | ORAL | Status: AC
  Administered 2017-09-03: 10 mg via ORAL
  Filled 2017-09-03: qty 2

## 2017-09-03 MED ORDER — FUROSEMIDE 10 MG/ML IJ SOLN
40.0000 mg | Freq: Once | INTRAMUSCULAR | Status: AC
  Administered 2017-09-03: 40 mg via INTRAVENOUS

## 2017-09-03 MED ORDER — LORAZEPAM 2 MG/ML IJ SOLN
1.0000 mg | Freq: Once | INTRAMUSCULAR | Status: AC
  Administered 2017-09-03: 1 mg via INTRAVENOUS

## 2017-09-03 MED ORDER — LORAZEPAM 2 MG/ML IJ SOLN
INTRAMUSCULAR | Status: AC
  Filled 2017-09-03: qty 1

## 2017-09-03 MED ORDER — ORAL CARE MOUTH RINSE
15.0000 mL | OROMUCOSAL | Status: DC
  Administered 2017-09-03 – 2017-09-06 (×32): 15 mL via OROMUCOSAL

## 2017-09-03 MED ORDER — CHLORHEXIDINE GLUCONATE 0.12% ORAL RINSE (MEDLINE KIT)
15.0000 mL | Freq: Two times a day (BID) | OROMUCOSAL | Status: DC
  Administered 2017-09-03: 15 mL via OROMUCOSAL

## 2017-09-03 MED ORDER — FENTANYL 2500MCG IN NS 250ML (10MCG/ML) PREMIX INFUSION
0.0000 ug/h | INTRAVENOUS | Status: DC
  Administered 2017-09-03 – 2017-09-04 (×2): 50 ug/h via INTRAVENOUS
  Filled 2017-09-03: qty 250

## 2017-09-03 MED ORDER — FUROSEMIDE 10 MG/ML IJ SOLN
20.0000 mg | Freq: Once | INTRAMUSCULAR | Status: AC
  Administered 2017-09-03: 20 mg via INTRAVENOUS
  Filled 2017-09-03: qty 2

## 2017-09-03 MED ORDER — MIDAZOLAM HCL 2 MG/2ML IJ SOLN
INTRAMUSCULAR | Status: AC
  Administered 2017-09-03: 2 mg via INTRAVENOUS
  Filled 2017-09-03: qty 2

## 2017-09-03 MED ORDER — IPRATROPIUM-ALBUTEROL 0.5-2.5 (3) MG/3ML IN SOLN
3.0000 mL | Freq: Four times a day (QID) | RESPIRATORY_TRACT | Status: DC
Start: 2017-09-03 — End: 2017-09-07
  Administered 2017-09-03 – 2017-09-06 (×14): 3 mL via RESPIRATORY_TRACT
  Filled 2017-09-03 (×15): qty 3

## 2017-09-03 MED ORDER — AMOXICILLIN-POT CLAVULANATE 875-125 MG PO TABS
1.0000 | ORAL_TABLET | Freq: Two times a day (BID) | ORAL | Status: DC
  Administered 2017-09-03: 1
  Filled 2017-09-03: qty 1

## 2017-09-03 MED ORDER — SODIUM CHLORIDE 0.9 % IV SOLN
1.5000 g | Freq: Three times a day (TID) | INTRAVENOUS | Status: DC
  Filled 2017-09-03: qty 1.5

## 2017-09-03 MED ORDER — INSULIN ASPART 100 UNIT/ML ~~LOC~~ SOLN: 2.0000 [IU] | SUBCUTANEOUS | Status: DC

## 2017-09-03 MED ORDER — FUROSEMIDE 10 MG/ML IJ SOLN
10.0000 mg/h | INTRAVENOUS | Status: DC
  Administered 2017-09-03 – 2017-09-04 (×2): 10 mg/h via INTRAVENOUS
  Filled 2017-09-03 (×2): qty 25

## 2017-09-03 MED ORDER — FUROSEMIDE 10 MG/ML IJ SOLN
INTRAMUSCULAR | Status: AC
  Filled 2017-09-03: qty 4

## 2017-09-03 MED ORDER — CHLORHEXIDINE GLUCONATE 0.12% ORAL RINSE (MEDLINE KIT)
15.0000 mL | Freq: Two times a day (BID) | OROMUCOSAL | Status: DC
  Administered 2017-09-03 – 2017-09-06 (×6): 15 mL via OROMUCOSAL

## 2017-09-03 MED ORDER — ORAL CARE MOUTH RINSE: 15.0000 mL | Freq: Four times a day (QID) | OROMUCOSAL | Status: DC

## 2017-09-03 MED ORDER — MONTELUKAST SODIUM 10 MG PO TABS
10.0000 mg | ORAL_TABLET | Freq: Every day | ORAL | Status: DC
  Administered 2017-09-03 – 2017-09-05 (×3): 10 mg
  Filled 2017-09-03 (×4): qty 1

## 2017-09-03 MED ORDER — SODIUM CHLORIDE 0.9 % IV SOLN
INTRAVENOUS | Status: DC
  Administered 2017-09-03: 06:00:00 via INTRAVENOUS

## 2017-09-03 MED ORDER — DOCUSATE SODIUM 50 MG/5ML PO LIQD
100.0000 mg | Freq: Two times a day (BID) | ORAL | Status: DC | PRN
  Administered 2017-09-06: 100 mg
  Filled 2017-09-03 (×2): qty 10

## 2017-09-03 MED ORDER — FENTANYL CITRATE (PF) 100 MCG/2ML IJ SOLN
INTRAMUSCULAR | Status: AC
  Filled 2017-09-03: qty 2

## 2017-09-03 MED ORDER — PROPOFOL 1000 MG/100ML IV EMUL
5.0000 ug/kg/min | INTRAVENOUS | Status: DC
  Administered 2017-09-03: 75 ug/kg/min via INTRAVENOUS
  Administered 2017-09-03: 80 ug/kg/min via INTRAVENOUS
  Administered 2017-09-03: 10 ug/kg/min via INTRAVENOUS
  Administered 2017-09-03: 60 ug/kg/min via INTRAVENOUS
  Administered 2017-09-03: 80 ug/kg/min via INTRAVENOUS
  Administered 2017-09-04: 30 ug/kg/min via INTRAVENOUS
  Filled 2017-09-03 (×2): qty 100
  Filled 2017-09-03: qty 200
  Filled 2017-09-03 (×4): qty 100
  Filled 2017-09-03: qty 200

## 2017-09-03 MED ORDER — PANTOPRAZOLE SODIUM 40 MG IV SOLR
40.0000 mg | Freq: Every day | INTRAVENOUS | Status: DC
  Administered 2017-09-03 – 2017-09-05 (×3): 40 mg via INTRAVENOUS
  Filled 2017-09-03 (×3): qty 40

## 2017-09-03 MED ORDER — MAGNESIUM HYDROXIDE 400 MG/5ML PO SUSP
30.0000 mL | Freq: Every day | ORAL | Status: DC
  Administered 2017-09-03: 30 mL
  Filled 2017-09-03: qty 30

## 2017-09-03 MED ORDER — FUROSEMIDE 10 MG/ML IJ SOLN
80.0000 mg | Freq: Once | INTRAMUSCULAR | Status: AC
  Administered 2017-09-03: 80 mg via INTRAVENOUS
  Filled 2017-09-03: qty 8

## 2017-09-03 MED ORDER — BISACODYL 10 MG RE SUPP
10.0000 mg | Freq: Every day | RECTAL | Status: DC | PRN
  Administered 2017-09-07: 10 mg via RECTAL
  Filled 2017-09-03: qty 1

## 2017-09-03 MED ORDER — SODIUM POLYSTYRENE SULFONATE 15 GM/60ML PO SUSP
30.0000 g | Freq: Once | ORAL | Status: AC
  Administered 2017-09-03: 30 g
  Filled 2017-09-03: qty 120

## 2017-09-03 MED ORDER — METHYLPREDNISOLONE SODIUM SUCC 125 MG IJ SOLR
60.0000 mg | Freq: Four times a day (QID) | INTRAMUSCULAR | Status: DC
  Administered 2017-09-03 – 2017-09-04 (×5): 60 mg via INTRAVENOUS
  Filled 2017-09-03 (×5): qty 2

## 2017-09-03 MED ORDER — FENTANYL CITRATE (PF) 100 MCG/2ML IJ SOLN
100.0000 ug | Freq: Once | INTRAMUSCULAR | Status: AC
  Administered 2017-09-03: 100 ug via INTRAVENOUS

## 2017-09-03 MED ORDER — IPRATROPIUM-ALBUTEROL 0.5-2.5 (3) MG/3ML IN SOLN
3.0000 mL | Freq: Three times a day (TID) | RESPIRATORY_TRACT | Status: DC
  Filled 2017-09-03: qty 3

## 2017-09-03 MED ORDER — FENTANYL CITRATE (PF) 100 MCG/2ML IJ SOLN
100.0000 ug | INTRAMUSCULAR | Status: DC | PRN
  Administered 2017-09-03: 100 ug via INTRAVENOUS
  Administered 2017-09-04: 50 ug via INTRAVENOUS
  Filled 2017-09-03: qty 2

## 2017-09-03 MED ORDER — SODIUM CHLORIDE 0.9 % IV SOLN
3.0000 g | Freq: Two times a day (BID) | INTRAVENOUS | Status: AC
  Administered 2017-09-03 – 2017-09-05 (×5): 3 g via INTRAVENOUS
  Filled 2017-09-03 (×5): qty 3

## 2017-09-03 MED ORDER — FENTANYL CITRATE (PF) 100 MCG/2ML IJ SOLN
100.0000 ug | INTRAMUSCULAR | Status: AC | PRN
  Administered 2017-09-03 (×2): 100 ug via INTRAVENOUS
  Administered 2017-09-04: 50 ug via INTRAVENOUS
  Filled 2017-09-03: qty 2

## 2017-09-03 MED ORDER — FUROSEMIDE 10 MG/ML IJ SOLN
4.0000 mg/h | INTRAVENOUS | Status: DC
  Administered 2017-09-03: 4 mg/h via INTRAVENOUS
  Filled 2017-09-03: qty 25

## 2017-09-03 MED ORDER — ALBUTEROL SULFATE (2.5 MG/3ML) 0.083% IN NEBU: 2.5000 mg | INHALATION_SOLUTION | RESPIRATORY_TRACT | Status: DC | PRN

## 2017-09-03 MED ORDER — MIDAZOLAM HCL 2 MG/2ML IJ SOLN
2.0000 mg | Freq: Once | INTRAMUSCULAR | Status: AC
  Administered 2017-09-03: 2 mg via INTRAVENOUS

## 2017-09-03 MED ORDER — FUROSEMIDE 10 MG/ML IJ SOLN
4.0000 mg/h | INTRAVENOUS | Status: DC
  Filled 2017-09-03: qty 25

## 2017-09-03 MED FILL — Medication: Qty: 1 | Status: AC

## 2017-09-03 NOTE — Care Management Note (Signed)
Case Management Note  Patient Details  Name: Brenda Sims MRN: [id number] Date of Birth: [birth year]  Subjective/Objective:    Cardiopulmonary arrest: In setting of pt with acute CHF exacerbation, LLL CAP and OSA conts on vent support.                   Action/Plan: NCM will follow for dc needs.   Expected Discharge Date:                  Expected Discharge Plan:     In-House Referral:     Discharge planning Services  CM Consult  Post Acute Care Choice:    Choice offered to:     DME Arranged:    DME Agency:     HH Arranged:    HH Agency:     Status of Service:  In process, will continue to follow  If discussed at Long Length of Stay Meetings, dates discussed:    Additional Comments:  [name], RN 09/03/2017, 5:02 PM

## 2017-09-03 NOTE — Procedures (Signed)
ELECTROENCEPHALOGRAM REPORT  Date of Study: 09/03/17  Patient's Name: Brenda Sims MRN: [id number] Date of Birth: Mar 05, 1956   Clinical History: 62 y/o s/p pulseless arrest.  Pt is intubated and sedated.  Pt is on hypothermic protocol.  Medications: Scheduled Meds: . aspirin EC  81 mg Oral Daily  . atorvastatin  40 mg Oral q1800  . chlorhexidine gluconate (MEDLINE KIT)  15 mL Mouth Rinse BID  . fentaNYL      . gabapentin  100 mg Per Tube TID  . heparin injection (subcutaneous)  5,000 Units Subcutaneous Q8H  . insulin aspart  2-6 Units Subcutaneous Q4H  . ipratropium-albuterol  3 mL Nebulization Q6H  . loratadine  10 mg Oral Daily  . magnesium hydroxide  30 mL Per Tube Daily  . mouth rinse  15 mL Mouth Rinse 10 times per day  . methylPREDNISolone (SOLU-MEDROL) injection  60 mg Intravenous Q6H  . montelukast  10 mg Per Tube QHS  . pantoprazole (PROTONIX) IV  40 mg Intravenous Daily  . pneumococcal 23 valent vaccine  0.5 mL Intramuscular Tomorrow-1000   Continuous Infusions: . ampicillin-sulbactam (UNASYN) IV    . fentaNYL infusion INTRAVENOUS 75 mcg/hr (09/03/17 1400)  . furosemide (LASIX) infusion 10 mg/hr (09/03/17 1400)  . [START ON 09/04/2017] furosemide (LASIX) infusion    . propofol (DIPRIVAN) infusion 75.089 mcg/kg/min (09/03/17 1400)   PRN Meds:.albuterol, bisacodyl, docusate, fentaNYL (SUBLIMAZE) injection, fentaNYL (SUBLIMAZE) injection   Technical Summary: A multichannel digital EEG recording measured by the international 10-20 system with electrodes applied with paste and impedances below 5000 ohms performed in our laboratory with EKG monitoring in an intubated and sedated patient.  Hyperventilation and photic stimulation were not performed.  The digital EEG was referentially recorded, reformatted, and digitally filtered in a variety of bipolar and referential montages for optimal display.    Description: The patient is intubated and sedated during the recording. There  is loss of normal background activity. The records read at a sensitivity of 3 uV/mm shows burst suppression pattern. There is no spontaneous reactivity noted during the recording.  Hyperventilation and photic stimulation were not performed. There were no epileptiform discharges or electrographic seizures seen.    Impression: This EEG is markedly abnormal due to diffuse background suppression and lack of EEG reactivity.  Clinical Correlation of the above findings indicates severe diffuse cerebral dysfunction that is non-specific in etiology and can be seen in the setting of anoxic/ischemic injury, toxic/metabolic encephalopathies, or sedating medications.  No electrographic seizures noted.  Correlate clinically.

## 2017-09-03 NOTE — Procedures (Addendum)
Arterial Catheter Insertion Procedure Note Brenda Sims [id number] Jul 16, 1956  Procedure: Insertion of Arterial Catheter  Indications: Blood pressure monitoring and Frequent blood sampling  Procedure Details Consent: Unable to obtain consent because of altered level of consciousness. Time Out: Verified patient identification, verified procedure, site/side was marked, verified correct patient position, special equipment/implants available, medications/allergies/relevent history reviewed, required imaging and test results available.  Performed  Maximum sterile technique was used including antiseptics, cap, gloves, gown, hand hygiene, mask and sheet. Skin prep: Chlorhexidine; local anesthetic administered 20 gauge catheter was inserted into left radial artery using the Seldinger technique.  Evaluation Blood flow good; BP tracing good. Complications: No apparent complications.  Arterial line placed per cooling protocol.    Virgilio Frees 09/03/2017

## 2017-09-03 NOTE — Progress Notes (Signed)
EEG Completed; Results Pending  

## 2017-09-03 NOTE — Progress Notes (Signed)
NP Kary Kos paged and notified concerning pt's SBP 160's/70's. PRN Hydralazine ordered. Will continue to monitor.

## 2017-09-03 NOTE — Code Documentation (Addendum)
  Patient Name: Brenda Sims   MRN: [id number]   Date of Birth/ Sex: Nov 10, 1955 , female      Admission Date: 08/29/2017  Attending Provider: Rosita Fire, MD  Primary Diagnosis: Acute Diastolic CHF Exacerbation   Indication: Pt was in her usual state of health until this PM, when she began having respiratory distress, placed on a nonrebreather, and was then noted to be unresponsive. Code blue was subsequently called. At the time of arrival on scene, ACLS protocol was underway.   Technical Description:  - CPR performance duration:  1 minute  - Was defibrillation or cardioversion used? No   - Was external pacer placed? No  - Was patient intubated pre/post CPR? Yes   Medications Administered: Y = Yes; Blank = No Amiodarone    Atropine    Calcium    Epinephrine    Lidocaine    Magnesium    Norepinephrine    Phenylephrine    Sodium bicarbonate    Vasopressin     Post CPR evaluation:  - Final Status - Was patient successfully resuscitated ? Yes - What is current rhythm? Normal Sinus rhythm - What is current hemodynamic status? Stable, BP 150/79, HR 93  Miscellaneous Information:  - Labs sent, including: UDS, BMP, CBG  - Primary team notified?  Yes  - Family Notified? Yes  - Additional notes/ transfer status: Transferred to 2 Heart intubated and sedated.       Melanee Spry, MD  09/03/2017, 4:28 AM

## 2017-09-03 NOTE — Progress Notes (Signed)
Initial Nutrition Assessment  DOCUMENTATION CODES:   Obesity unspecified  INTERVENTION:   Recommend initiation of TF within 24 hours, if unable to extubate  Tube Feeding Recommendations:  With current diprivan, Vital High Protein @ 10 ml/hr Pro-Stat 60 mL TID Provides 111 g of protein, 840 kcals, 202 mL of free water Meets 100% protein needs  NUTRITION DIAGNOSIS:   Inadequate oral intake related to acute illness as evidenced by NPO status.  GOAL:   Provide needs based on ASPEN/SCCM guidelines   MONITOR:   Vent status, Labs, Weight trends  REASON FOR ASSESSMENT:   Ventilator    ASSESSMENT:   62 yo admitted with acute respiratory failure in setting of acute on chronic CHF, LLL pneumonia. Pt with hx of CKD, DM, HTN  1/22 Pt went unresponsive and pulseless and Code Blue initiated.  Started on TTM (normothermia 36 degrees) Patient is currently intubated on ventilator support MV: 11.4 L/min Temp (24hrs), Avg:96.5 F (35.8 C), Min:95.7 F (35.4 C), Max:98.2 F (36.8 C)  Propofol: 37.8 ml/hr   Weight trending up since admission Recorded po 25-100% of meals prior to intubation  Labs: potassium 6.5 (H), Creatinine 2.07, BUN 37, phosphorus 6.3 (H) Meds: lasix,  Solumedrol, fentanyl, diprivan, kayexalate  NUTRITION - FOCUSED PHYSICAL EXAM:  Unable to assess; pt not in room on visit  Diet Order:  Diet NPO time specified  EDUCATION NEEDS:   Not appropriate for education at this time  Skin:  Skin Assessment: Reviewed RN Assessment  Last BM:  1/19  Height:   Ht Readings from Last 1 Encounters:  08/29/17 5\' 4"  (1.626 m)    Weight:   Wt Readings from Last 1 Encounters:  09/03/17 186 lb 8.2 oz (84.6 kg)    Ideal Body Weight:  54.5 kg  BMI:  Body mass index is 32.01 kg/m.  Estimated Nutritional Needs:   Kcal:  263-3354 kcals  Protein:  109-136 g  Fluid:  >/= 1.5 L   Kerman Passey MS, RD, LDN, CNSC [phone] Pager  [phone]  Weekend/On-Call Pager

## 2017-09-03 NOTE — Progress Notes (Signed)
Pharmacy Antibiotic Note  Brenda Sims is a 62 y.o. female admitted on 08/29/2017 with pneumonia.  Pharmacy has been consulted for unasyn dosing.  S/p PEA arrest with respiratory distress. Was on levaquin for 3 days then augmentin for two days. WBC is 17.6 (on steroids). PCT is 0.74. Renal function is worsening (Scr 2.07, CrCl ~30 mL/min).  Plan: Unasyn 3 g IV every 12 hours  Monitor renal function, cx results, clinical pictures  Height: 5\' 4"  (162.6 cm) Weight: 186 lb 8.2 oz (84.6 kg) IBW/kg (Calculated) : 54.7  Temp (24hrs), Avg:96.7 F (35.9 C), Min:95.7 F (35.4 C), Max:98.4 F (36.9 C)  Recent Labs  Lab 08/29/17 1530 08/30/17 0623 08/31/17 0912 09/01/17 0756 09/02/17 0437 09/03/17 0423 09/03/17 0523  WBC 12.7* 11.6*  --  16.3*  --   --  17.6*  CREATININE 1.43* 1.88* 1.66* 1.56* 1.79* 2.11* 2.07*  LATICACIDVEN  --   --   --   --   --   --  0.8    Estimated Creatinine Clearance: 30.1 mL/min (A) (by C-G formula based on SCr of 2.07 mg/dL (H)).    Allergies  Allergen Reactions  . Azithromycin Swelling    Antimicrobials this admission: Levaquin 1/17 >> 1/19 Augmentin 1/20 >> 1/22 Unasyn 1/22 >>  Dose adjustments this admission: N/A  Microbiology results: 1/20 Resp panel: negative  1/22 MRSA PCR: negative  Thank you for allowing pharmacy to be a part of this patient's care.  Doylene Canard, PharmD Clinical Pharmacist  Pager: [phone] Clinical Phone for 09/03/2017 until 3:30pm: [phone] If after 3:30pm, please call main pharmacy at [phone] 09/03/2017 10:32 AM

## 2017-09-03 NOTE — Progress Notes (Signed)
[place] Progress Note Patient Name: Torrin Frein DOB: [birth year] MRN: [id number]   Date of Service  09/03/2017  HPI/Events of Note  Nurse reports pupils are 4 - 5 mm and non reactive.   eICU Interventions  Will order: 1. Head CT Scan wo contrast now.      Intervention Category Major Interventions: Other:  Sommer,Steven Cornelia Copa 09/03/2017, 6:45 AM

## 2017-09-03 NOTE — Progress Notes (Signed)
PULMONARY / CRITICAL CARE MEDICINE   Name: Brenda Sims MRN: [id number] DOB: [birth year]    ADMISSION DATE:  08/29/2017 CONSULTATION DATE:  09/03/2017  REFERRING MD:  [facility], Dron Tanna Furry MD  CHIEF COMPLAINT:  PEA arrest and respiratory distress  HISTORY OF PRESENT ILLNESS:   37 yoF with CHF, insulin dependent diabetes, CKD and HTN who presented to [facility] for dyspnea and hypoxia. She was admitted for acute on chronic congestive heart failure. At the time of admission she had attested to progressively worsening dyspnea with exhertion since moving to the area approximately one month prior. She was noted to be hypoxic in urgent care where she was sent to the ED. She reported subjective fever, chills, nausea, headache, dizziness, abdominal pain, dysuria, urgency, diarrhea and constipation.   She was treated for acute HFpEF, cardiopulmonary edema, with notable concern for RLL CAP. The patient was started on antibiotics, Flu/RSV negative, and lasix for diuresis. BNP was elevated to 1016 with CXR chest indicative of pulmonary edema with mild confluent opacity at the right base. CT chest on 1/20 demonstrated ground-glass opacities in the upper lobe with a mosaic pattern most consistent w/ pulmonary edema with reactive mediastinal lymphadenopathy. WBC count trending upward since admission, anemic with Hgb stable since admission.   Day 5 of admission: Patient noted to be unresponsive and pulseless with subsequent code blue initiated. ROSC was achieved after approximately 1 minute but the patient was noted have agonal breathing. She was subsequently intubated.   Approximately one hour prior to the code, the patient was noted to be extremely anxious, wheezing, with normal SpO2 sats. She was placed on a nonrebreather due to her concern of dyspnea, given lasix 40mg  IV, and ativan 1 mg IV. She coughed, rolled over. She was noted to be lying prone and pulses at that point.    SUBJECTIVE:  Agitation overnight but no further events  VITAL SIGNS: BP 129/66   Pulse 72   Temp (!) 96.4 F (35.8 C) (Core)   Resp 18   Ht 5\' 4"  (1.626 m)   Wt 84.6 kg (186 lb 8.2 oz)   SpO2 100%   BMI 32.01 kg/m   HEMODYNAMICS:    VENTILATOR SETTINGS: Vent Mode: PRVC FiO2 (%):  [100 %] 100 % Set Rate:  [18 bmp] 18 bmp Vt Set:  [470 mL] 470 mL PEEP:  [5 cmH20-8 cmH20] 8 cmH20  INTAKE / OUTPUT: I/O last 3 completed shifts: In: 1061.1 [P.O.:1017; I.V.:44.1] Out: 1926 [Urine:1925; Stool:1]  PHYSICAL EXAMINATION: General:  Sedated, unresponsive, not withdrawing to pain Neuro:  Sedated and intubated, not following commands, exophthalmos HEENT:  Pupils sluggish Cardiovascular:  RRR, no murmur auscultated (difficult to assess over lung sounds) Lungs:  Diffuse crackles Abdomen:  Soft, NT, ND and +BS Musculoskeletal:  No pedel edema, dorsalis pedis pulses palpable bilaterally   Skin:  Warm to touch, no rashes or ecchymosis noted on exam  LABS:  BMET Recent Labs  Lab 09/02/17 0437 09/03/17 0423 09/03/17 0523  NA 139 140 141  K 4.5 5.9* 6.5*  CL 103 104 107  CO2 24 21* 23  BUN 23* 36* 37*  CREATININE 1.79* 2.11* 2.07*  GLUCOSE 118* 234* 156*   Electrolytes Recent Labs  Lab 08/31/17 0912  09/01/17 1001 09/02/17 0437 09/03/17 0423 09/03/17 0523  CALCIUM 8.3*   < >  --  8.2* 8.6* 8.5*  MG 1.9  --  1.5* 1.9  --   --   PHOS  --   --   --   --   --  6.3*   < > = values in this interval not displayed.   CBC Recent Labs  Lab 08/30/17 0623 09/01/17 0756 09/03/17 0523  WBC 11.6* 16.3* 17.6*  HGB 8.7* 8.2* 7.4*  HCT 27.8* 25.9* 23.7*  PLT 342 348 317   Coag's Recent Labs  Lab 09/03/17 0523  APTT 41*  INR 1.30    Sepsis Markers Recent Labs  Lab 09/03/17 0523  LATICACIDVEN 0.8  PROCALCITON 0.74    ABG Recent Labs  Lab 09/03/17 0506 09/03/17 0609  PHART 7.226* 7.266*  PCO2ART 60.6* 56.2*  PO2ART 76.0* 78.0*    Liver Enzymes Recent  Labs  Lab 09/03/17 0523  AST 58*  ALT 30  ALKPHOS 84  BILITOT 0.6  ALBUMIN 2.6*    Cardiac Enzymes No results for input(s): TROPONINI, PROBNP in the last 168 hours.  Glucose Recent Labs  Lab 09/02/17 0728 09/02/17 1201 09/02/17 1620 09/02/17 2058 09/03/17 0619 09/03/17 0748  GLUCAP 96 106* 131* 168* 158* 120*   Imaging Dg Abd 1 View  Result Date: 09/03/2017 CLINICAL DATA:  Feeding tube placement EXAM: ABDOMEN - 1 VIEW COMPARISON:  None. FINDINGS: Nasogastric tube tip and side-port in the stomach. Reticular opacities throughout the visualized lung bases. IMPRESSION: Enteric tube tip and side-port in the stomach. Electronically Signed   By: Ulyses Jarred M.D.   On: 09/03/2017 05:09   Dg Chest Port 1 View  Result Date: 09/03/2017 CLINICAL DATA:  Endotracheal intubation EXAM: PORTABLE CHEST 1 VIEW COMPARISON:  Chest CT 09/01/2017 FINDINGS: Endotracheal tube tip is at the level of the clavicular heads. There diffuse reticular opacities throughout both lungs. Small right pleural effusion. IMPRESSION: 1. Radiographically appropriate position of endotracheal tube. 2. Diffuse interstitial opacities, most consistent with pulmonary edema. Electronically Signed   By: Ulyses Jarred M.D.   On: 09/03/2017 05:10    STUDIES:  1/17 Renal US >>  Mildly atrophic and echogenic right kidney. Clinical correlation is recommended. No hydronephrosis or shadowing stone; Trace left pleural effusion.  TTE 1/18 >>  Dilated LV with moderate LVH, low normal LVF EF 50-55%, mitral calcified annulus withmildly Ca+ MV leaflets and mild MR, mild LAE, grade 2 DD, mild to moderate aortic annular calcification, trivial TR, dilated IVC.  1/20 CT chest >>  1. Severe ground-glass opacities in upper lobe mosaic pattern most consistent with pulmonary edema. 2. Mediastinal lymphadenopathy is favored reactive.  CULTURES: 1/20 RVP >> negative 1/20 MRSA PCR >>  ANTIBIOTICS: 1/17 Levaquin >> 1/20 1/20  Augmentin >>1/22 1/22 unasyn>>>  SIGNIFICANT EVENTS: 1/17  Admit 1/22  Cardiac arrest ~1 min  LINES/TUBES: PIV  1/22 ETT>>> 1/22 OGT>>> 1/22 Foley>>> 1/22 L radial a-line>>>  DISCUSSION: 58 yoF admitted with   ASSESSMENT / PLAN:  PULMONARY A: Acute hypoxic respiratory failure LLL PNA/ CAP Pulmonary edema  Tobacco abuse Hx mild asthma P:   Full MV support Increase PEEP to 12 and FiO2 to 100 Increase RR to 28 PRVC 8 cc/kg CXR and ABG in AM ABG at 11 AM Continue duonebs, increase to q 6hr , add albuterol prn  Continue solumedrol 60mg  q 6hrs Continue Claritin and singular  Continue nicotine patch  CARDIOVASCULAR A:  PEA Arrest - brief 1 min CPR Acute on chronic diastolic heart failure Prolonged QTc Hx HTN, HLD P:  Tele monitoring Goal MAP > 65 Diureses as below Continue ASA, lipitor Trend troponin, EKG Trend lactate Hold apresoline 100mg  BID and labetalol 300 mg BID  RENAL A:   AKI on CKD  Hyperkalemia  P:   Increase lasix drip to 10 mg/hr x24 hours Zaroxolyn 10 mg PO x1 Strict I/O BMET now (K is rising). Trend BMP / mag/ phos/ urinary output/ daily weights Replace electrolytes as indicated  GASTROINTESTINAL A:   No acute issues  P:   NPO Assess LFTs PPI for SUP Bowel regimen for PAD protocol TF in AM  HEMATOLOGIC A:   Anemia, ? IDA  P:  Trend CBC Lovenox for VTE ppx Assess coags  Hold ferrous sulfate   INFECTIOUS A:   CAP - LLL PNA P:   Assess PCT Trend WBC D/C PO augmentin IV unasyn TTM protocol at 36 as below  ENDOCRINE A:   IDDM  P:   CBG q 4 SSI   NEUROLOGIC A:   Acute encephalopathy following cardiac arrest, concern for anoxic injury vs metabolic Anisocoria noted with right greater than the left as above, this was not noted on her previous exam. P:   RASS goal: -2 Fentanyl drip Propofol drip Assess TTE Head CT for today ordered UDS positive for benzo and opiate Continue Neurontin, celexa, and  Xanax 0.5 mg BID   FAMILY  - Updates: No family at bedside to updated.  - Inter-disciplinary family meet or Palliative Care meeting due by:  09/11/2017  The patient is critically ill with multiple organ systems failure and requires high complexity decision making for assessment and support, frequent evaluation and titration of therapies, application of advanced monitoring technologies and extensive interpretation of multiple databases.   Critical Care Time devoted to patient care services described in this note is  60  Minutes. This time reflects time of care of this signee Dr Jennet Maduro. This critical care time does not reflect procedure time, or teaching time or supervisory time of PA/NP/Med student/Med Resident etc but could involve care discussion time.  Rush Farmer, M.D. [facility] Pulmonary/Critical Care Medicine. Pager: [phone]. After hours pager: [phone].

## 2017-09-03 NOTE — H&P (Signed)
PULMONARY / CRITICAL CARE MEDICINE   Name: Brenda Sims MRN: [id number] DOB: 02-20-1956    ADMISSION DATE:  08/29/2017 CONSULTATION DATE:  09/03/2017  REFERRING MD:  [facility], Dron Tanna Furry MD  CHIEF COMPLAINT:  PEA arrest and respiratory distress  HISTORY OF PRESENT ILLNESS:   26 yoF with CHF, insulin dependent diabetes, CKD and HTN who presented to [facility] for dyspnea and hypoxia. She was admitted for acute on chronic congestive heart failure. At the time of admission she had attested to progressively worsening dyspnea with exhertion since moving to the area approximately one month prior. She was noted to be hypoxic in urgent care where she was sent to the ED. She reported subjective fever, chills, nausea, headache, dizziness, abdominal pain, dysuria, urgency, diarrhea and constipation.   She was treated for acute HFpEF, cardiopulmonary edema, with notable concern for RLL CAP. The patient was started on antibiotics, Flu/RSV negative, and lasix for diuresis. BNP was elevated to 1016 with CXR chest indicative of pulmonary edema with mild confluent opacity at the right base. CT chest on 1/20 demonstrated ground-glass opacities in the upper lobe with a mosaic pattern most consistent w/ pulmonary edema with reactive mediastinal lymphadenopathy. WBC count trending upward since admission, anemic with Hgb stable since admission.   Day 5 of admission: Patient noted to be unresponsive and pulseless with subsequent code blue initiated. ROSC was achieved after approximately 1 minute but the patient was noted have agonal breathing. She was subsequently intubated.   Approximately one hour prior to the code, the patient was noted to be extremely anxious, wheezing, with normal SpO2 sats. She was placed on a nonrebreather due to her concern of dyspnea, given lasix 40mg  IV, and ativan 1 mg IV. She coughed, rolled over. She was noted to be lying prone and pulses at that point.   PAST  MEDICAL HISTORY :  She  has a past medical history of CKD (chronic kidney disease), Diabetes mellitus without complication ([place]), and Hypertension.  PAST SURGICAL HISTORY: She  has a past surgical history that includes Appendectomy; Back surgery; and Total knee arthroplasty (Right, 2007).  Allergies  Allergen Reactions  . Azithromycin Swelling    No current facility-administered medications on file prior to encounter.    Current Outpatient Medications on File Prior to Encounter  Medication Sig  . aspirin EC 81 MG tablet Take 81 mg by mouth daily.  . cetirizine (ZYRTEC) 10 MG tablet Take 10 mg by mouth daily.  . citalopram (CELEXA) 20 MG tablet Take 20 mg by mouth daily.  . cloNIDine (CATAPRES - DOSED IN MG/24 HR) 0.2 mg/24hr patch Place 0.2 mg onto the skin once a week.  . furosemide (LASIX) 20 MG tablet Take 20 mg by mouth daily.  Marland Kitchen gabapentin (NEURONTIN) 300 MG capsule Take 300 mg by mouth 3 (three) times daily.  . insulin aspart (NOVOLOG) 100 UNIT/ML injection Inject 20 Units into the skin 3 (three) times daily before meals.   Marland Kitchen labetalol (NORMODYNE) 200 MG tablet Take 200 mg by mouth 2 (two) times daily.  . metFORMIN (GLUCOPHAGE) 1000 MG tablet Take 1,000 mg by mouth 2 (two) times daily with a meal.  . montelukast (SINGULAIR) 10 MG tablet Take 10 mg by mouth at bedtime.  Marland Kitchen NIFEdipine (ADALAT CC) 90 MG 24 hr tablet Take 90 mg by mouth daily.   FAMILY HISTORY:  Her has no family status information on file.   SOCIAL HISTORY: She  reports that  has never smoked. she  has never used smokeless tobacco. She reports that she does not drink alcohol or use drugs.  REVIEW OF SYSTEMS:   Unable to assess due to patients medical status.   SUBJECTIVE:  n/a  VITAL SIGNS: BP (!) 150/79   Pulse (!) 117   Temp 98.2 F (36.8 C) (Oral)   Resp (!) 26   Ht 5\' 4"  (1.626 m)   Wt 185 lb (83.9 kg)   SpO2 96%   BMI 31.76 kg/m   HEMODYNAMICS:    VENTILATOR SETTINGS:    INTAKE /  OUTPUT: I/O last 3 completed shifts: In: 1370 [P.O.:1320; IV Piggyback:50] Out: 1726 [Urine:1725; Stool:1]  PHYSICAL EXAMINATION: General:  Sedated on mechanical ventilation, diaphoretic  Neuro:  Patient unresponsive to commands, down going babinski, unable to assess focal status HEENT:  Pupils unresponsive to light and unequal, right 70mm, left 81mm, bilateral lens opacity noted Cardiovascular:  RRR, no murmur auscultated (difficult to assess over lung sounds) Lungs:  Bilateral wheezing, and crackles upper and lower lung fields Abdomen:  Soft, non-distended, bowel sounds  Musculoskeletal:  No pedel edema, dorsalis pedis pulses palpable bilaterally   Skin:  Warm to touch, no rashes or ecchymosis noted on exam  LABS:  BMET Recent Labs  Lab 08/31/17 0912 09/01/17 0756 09/02/17 0437  NA 139 140 139  K 3.3* 3.2* 4.5  CL 102 101 103  CO2 25 25 24   BUN 23* 20 23*  CREATININE 1.66* 1.56* 1.79*  GLUCOSE 132* 106* 118*   Electrolytes Recent Labs  Lab 08/31/17 0912 09/01/17 0756 09/01/17 1001 09/02/17 0437  CALCIUM 8.3* 8.0*  --  8.2*  MG 1.9  --  1.5* 1.9   CBC Recent Labs  Lab 08/29/17 1530 08/30/17 0623 09/01/17 0756  WBC 12.7* 11.6* 16.3*  HGB 9.1* 8.7* 8.2*  HCT 29.0* 27.8* 25.9*  PLT 350 342 348   Coag's No results for input(s): APTT, INR in the last 168 hours.  Sepsis Markers No results for input(s): LATICACIDVEN, PROCALCITON, O2SATVEN in the last 168 hours.  ABG No results for input(s): PHART, PCO2ART, PO2ART in the last 168 hours.  Liver Enzymes No results for input(s): AST, ALT, ALKPHOS, BILITOT, ALBUMIN in the last 168 hours.  Cardiac Enzymes No results for input(s): TROPONINI, PROBNP in the last 168 hours.  Glucose Recent Labs  Lab 09/01/17 2036 09/01/17 2216 09/02/17 0728 09/02/17 1201 09/02/17 1620 09/02/17 2058  GLUCAP 135* 154* 96 106* 131* 168*   Imaging No results found.  STUDIES:  1/17 Renal US >>  Mildly atrophic and echogenic  right kidney. Clinical correlation is recommended. No hydronephrosis or shadowing stone; Trace left pleural effusion.  TTE 1/18 >>  Dilated LV with moderate LVH, low normal LVF EF 50-55%, mitral calcified annulus withmildly Ca+ MV leaflets and mild MR, mild LAE, grade 2 DD, mild to moderate aortic annular calcification, trivial TR, dilated IVC.  1/20 CT chest >>  1. Severe ground-glass opacities in upper lobe mosaic pattern most consistent with pulmonary edema. 2. Mediastinal lymphadenopathy is favored reactive.  CULTURES: 1/20 RVP >> negative 1/20 MRSA PCR >>  ANTIBIOTICS: 1/17 Levaquin >> 1/20 1/20 Augmentin >>  SIGNIFICANT EVENTS: 1/17  Admit 1/22  Cardiac arrest ~1 min  LINES/TUBES: PIV  1/22 ETT >> 1/22 OGT >> 1/22 Foley   DISCUSSION: 44 yoF admitted with   ASSESSMENT / PLAN:  PULMONARY A: Acute hypoxic respiratory failure LLL PNA/ CAP Pulmonary edema  Tobacco abuse Hx mild asthma P:   Full MV support PRVC 8  cc/kg CXR and ABG now Continue duonebs, increase to q 6hr , add albuterol prn  Continue solumedrol 60mg  q 6hrs Continue Claritin and singular  Continue nicotine patch  CARDIOVASCULAR A:  PEA Arrest - brief 1 min CPR Acute on chronic diastolic heart failure Prolonged QTc Hx HTN, HLD P:  Tele monitoring Goal MAP > 65 Start lasix gtt Continue ASA, lipitor Trend troponin, EKG Trend lactate Hold apresoline 100mg  BID and labetalol 300 mg BID  RENAL A:   AKI on CKD  P:   Place foley  S/p 60 mg lasix and then gtt as above Trend BMP / mag/ phos/ urinary output/ daily weights Replace electrolytes as indicated  GASTROINTESTINAL A:   No acute issues  P:   NPO Assess LFTs PPI for SUP Bowel regimen for PAD protocol  HEMATOLOGIC A:   Anemia, ? IDA  P:  Trend CBC Lovenox for VTE ppx Assess coags  Hold ferrous sulfate   INFECTIOUS A:   CAP - LLL PNA P:   Assess PCT Trend WBC Continue augmentin PO TTM protocol at  36 as below  ENDOCRINE A:   IDDM  P:   CBG q 4 SSI   NEUROLOGIC A:   Acute encephalopathy following cardiac arrest, concern for anoxic injury vs metabolic Anisocoria noted with right greater than the left as above, this was not noted on her previous exam. P:   RASS goal: -2 PAD protocol with propofol and fentanyl prn  Assess TTE Initiate TTM at 36 as she has had no purposeful movement following cardiac arrest  UDS pending Continue Neurontin, celexa, and Xanax 0.5 mg BID   FAMILY  - Updates: No family at bedside.   - Inter-disciplinary family meet or Palliative Care meeting due by:  09/11/2017

## 2017-09-03 NOTE — Progress Notes (Signed)
CRITICAL VALUE ALERT  Critical Value:  Potassium 6.5  Date & Time Notied: 09/03/17  Provider Notified: Marni Griffon  Orders Received/Actions taken: Kayexalate 30g ordered

## 2017-09-03 NOTE — Progress Notes (Addendum)
Called by bedside RN d/t respiratory distress. Pt had just been given a duoneb prior to RRT being called. On arrival pt sitting on side of bed in tripod position.  Pt very anxious, using accessory muscles to breathe.  Pt had pulled off NRB mask.  Mask replaced. VS: HR-92 SR, BP-165/83, RR-50s, SpO2-99%. Lungs with insp and exp wheezes and scattered crackles t/o.  Pt warm and diaphoretic.Pt placed back in bed and repositioned into high fowlers.  Schorr, NP notified and 40mg  lasix and 1mg  ativan IV given.  Pt became less anxious after ativan and NRB changed to 6L [place] d/t SpO2-100%, RR-30s.  0357-Pt turned onto stomach in bed, pt was repositioned to back and found to be unresponsive with agonal respirations.  No pulse was palpated.  CPR began and Code Blue called.  CPR done for <1 minute and ROSC achieved.  Pt intubated by CRNA d/t agonal respirations.  Pt given 100mg  fentanyl and 2mg  versed and transported to 2H17 with code team. Report given by bedside RN to Elzie Rings, [facility] RN.
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
## 2017-09-03 NOTE — Progress Notes (Signed)
  Echocardiogram 2D Echocardiogram has been performed.  Brenda Sims 09/03/2017, 9:56 AM
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
## 2017-09-03 NOTE — Progress Notes (Signed)
Pt extremely anxious c/o SOB.  O2 Sats dropping as low as 77 on 2L [place].  RT called. DUONEB given. NRB placed on pt. Pt sats back up between 90-100. Pt pacing around the room. Pt asked to sit on bed so staff can try and calm her down. Pt stating "I don't want to die", my husband said I was going to die". Rapid Response called to assess pt. Schorr, NP notified. Orders given for Lasix 40mg  IV and Ativan 1mg  IV.  0400: See Rapid Response and Lamar Blinks, NP note.

## 2017-09-04 ENCOUNTER — Inpatient Hospital Stay (HOSPITAL_COMMUNITY): Payer: Medicaid Other

## 2017-09-04 DIAGNOSIS — I5041 Acute combined systolic (congestive) and diastolic (congestive) heart failure: Secondary | ICD-10-CM

## 2017-09-04 DIAGNOSIS — E109 Type 1 diabetes mellitus without complications: Secondary | ICD-10-CM

## 2017-09-04 DIAGNOSIS — I10 Essential (primary) hypertension: Secondary | ICD-10-CM

## 2017-09-04 LAB — CBC
HCT: 23 % — ABNORMAL LOW (ref 36.0–46.0)
Hemoglobin: 7.4 g/dL — ABNORMAL LOW (ref 12.0–15.0)
MCH: 21 pg — AB (ref 26.0–34.0)
MCHC: 32.2 g/dL (ref 30.0–36.0)
MCV: 65.2 fL — AB (ref 78.0–100.0)
PLATELETS: 328 10*3/uL (ref 150–400)
RBC: 3.53 MIL/uL — ABNORMAL LOW (ref 3.87–5.11)
RDW: 22 % — ABNORMAL HIGH (ref 11.5–15.5)
WBC: 16.1 10*3/uL — ABNORMAL HIGH (ref 4.0–10.5)

## 2017-09-04 LAB — GLUCOSE, CAPILLARY
GLUCOSE-CAPILLARY: 172 mg/dL — AB (ref 65–99)
GLUCOSE-CAPILLARY: 147 mg/dL — AB (ref 65–99)
Glucose-Capillary: 136 mg/dL — ABNORMAL HIGH (ref 65–99)
Glucose-Capillary: 130 mg/dL — ABNORMAL HIGH (ref 65–99)
Glucose-Capillary: 145 mg/dL — ABNORMAL HIGH (ref 65–99)

## 2017-09-04 LAB — PHOSPHORUS
PHOSPHORUS: 8.9 mg/dL — AB (ref 2.5–4.6)
Phosphorus: 8.1 mg/dL — ABNORMAL HIGH (ref 2.5–4.6)
Phosphorus: 8.5 mg/dL — ABNORMAL HIGH (ref 2.5–4.6)

## 2017-09-04 LAB — BLOOD GAS, ARTERIAL
Acid-Base Excess: 0.7 mmol/L (ref 0.0–2.0)
Bicarbonate: 25.2 mmol/L (ref 20.0–28.0)
FIO2: 40
MECHVT: 470 mL
O2 SAT: 98.9 %
PATIENT TEMPERATURE: 98.6
PCO2 ART: 42.8 mmHg (ref 32.0–48.0)
PEEP: 12 cmH2O
PH ART: 7.388 (ref 7.350–7.450)
RATE: 24 resp/min
pO2, Arterial: 175 mmHg — ABNORMAL HIGH (ref 83.0–108.0)

## 2017-09-04 LAB — POCT I-STAT 3, ART BLOOD GAS (G3+)
Bicarbonate: 24.8 mmol/L (ref 20.0–28.0)
O2 Saturation: 99 %
PH ART: 7.407 (ref 7.350–7.450)
Patient temperature: 35.9
TCO2: 26 mmol/L (ref 22–32)
pCO2 arterial: 39 mmHg (ref 32.0–48.0)
pO2, Arterial: 148 mmHg — ABNORMAL HIGH (ref 83.0–108.0)

## 2017-09-04 LAB — BASIC METABOLIC PANEL
Anion gap: 15 (ref 5–15)
BUN: 50 mg/dL — AB (ref 6–20)
CHLORIDE: 105 mmol/L (ref 101–111)
CO2: 23 mmol/L (ref 22–32)
CREATININE: 2.31 mg/dL — AB (ref 0.44–1.00)
Calcium: 8.3 mg/dL — ABNORMAL LOW (ref 8.9–10.3)
GFR calc Af Amer: 25 mL/min — ABNORMAL LOW (ref >60)
GFR, EST NON AFRICAN AMERICAN: 22 mL/min — AB (ref >60)
GLUCOSE: 162 mg/dL — AB (ref 65–99)
POTASSIUM: 3.8 mmol/L (ref 3.5–5.1)
Sodium: 143 mmol/L (ref 135–145)

## 2017-09-04 LAB — PROCALCITONIN: PROCALCITONIN: 1.14 ng/mL

## 2017-09-04 LAB — TROPONIN I
Troponin I: 0.14 ng/mL (ref <0.03)
Troponin I: 0.2 ng/mL (ref <0.03)
Troponin I: 0.19 ng/mL (ref <0.03)

## 2017-09-04 LAB — MAGNESIUM
Magnesium: 2.3 mg/dL (ref 1.7–2.4)
Magnesium: 2.4 mg/dL (ref 1.7–2.4)
Magnesium: 2.5 mg/dL — ABNORMAL HIGH (ref 1.7–2.4)

## 2017-09-04 MED ORDER — DEXMEDETOMIDINE HCL IN NACL 200 MCG/50ML IV SOLN
0.4000 ug/kg/h | INTRAVENOUS | Status: DC
  Administered 2017-09-04: 0.4 ug/kg/h via INTRAVENOUS
  Filled 2017-09-04: qty 50

## 2017-09-04 MED ORDER — BUDESONIDE 0.25 MG/2ML IN SUSP
0.2500 mg | Freq: Two times a day (BID) | RESPIRATORY_TRACT | Status: DC
  Administered 2017-09-04 – 2017-09-12 (×16): 0.25 mg via RESPIRATORY_TRACT
  Filled 2017-09-04 (×16): qty 2

## 2017-09-04 MED ORDER — MIDAZOLAM HCL 2 MG/2ML IJ SOLN
2.0000 mg | Freq: Once | INTRAMUSCULAR | Status: AC
  Administered 2017-09-04: 2 mg via INTRAVENOUS

## 2017-09-04 MED ORDER — DEXMEDETOMIDINE HCL IN NACL 400 MCG/100ML IV SOLN
0.1000 ug/kg/h | INTRAVENOUS | Status: DC
  Administered 2017-09-04: 0.6 ug/kg/h via INTRAVENOUS
  Administered 2017-09-04: 1 ug/kg/h via INTRAVENOUS
  Administered 2017-09-05 (×2): 1.2 ug/kg/h via INTRAVENOUS
  Administered 2017-09-05 – 2017-09-06 (×2): 0.5 ug/kg/h via INTRAVENOUS
  Filled 2017-09-04 (×7): qty 100

## 2017-09-04 MED ORDER — HYDRALAZINE HCL 50 MG PO TABS
100.0000 mg | ORAL_TABLET | Freq: Three times a day (TID) | ORAL | Status: DC
  Administered 2017-09-04 – 2017-09-06 (×7): 100 mg
  Filled 2017-09-04 (×8): qty 2

## 2017-09-04 MED ORDER — MIDAZOLAM HCL 2 MG/2ML IJ SOLN
INTRAMUSCULAR | Status: AC
  Administered 2017-09-04: 2 mg
  Filled 2017-09-04: qty 2

## 2017-09-04 MED ORDER — NICARDIPINE HCL IN NACL 20-0.86 MG/200ML-% IV SOLN
3.0000 mg/h | INTRAVENOUS | Status: AC
  Administered 2017-09-04 – 2017-09-05 (×5): 5 mg/h via INTRAVENOUS
  Filled 2017-09-04 (×7): qty 200

## 2017-09-04 MED ORDER — PRO-STAT SUGAR FREE PO LIQD: 30.0000 mL | Freq: Two times a day (BID) | ORAL | Status: DC

## 2017-09-04 MED ORDER — POTASSIUM CHLORIDE 20 MEQ/15ML (10%) PO SOLN
30.0000 meq | Freq: Once | ORAL | Status: AC
  Administered 2017-09-04: 30 meq via ORAL
  Filled 2017-09-04: qty 30

## 2017-09-04 MED ORDER — SODIUM CHLORIDE 0.9 % IV SOLN
INTRAVENOUS | Status: DC
  Administered 2017-09-04: 14:00:00 via INTRAVENOUS

## 2017-09-04 MED ORDER — FENTANYL BOLUS VIA INFUSION
25.0000 ug | INTRAVENOUS | Status: DC | PRN
  Administered 2017-09-04: 25 ug via INTRAVENOUS
  Filled 2017-09-04: qty 25

## 2017-09-04 MED ORDER — VITAL HIGH PROTEIN PO LIQD
1000.0000 mL | ORAL | Status: DC
  Administered 2017-09-04 – 2017-09-05 (×3): 1000 mL

## 2017-09-04 MED ORDER — MIDAZOLAM HCL 2 MG/2ML IJ SOLN: 2.0000 mg | Freq: Once | INTRAMUSCULAR | Status: AC

## 2017-09-04 MED ORDER — CHLORHEXIDINE GLUCONATE CLOTH 2 % EX PADS
6.0000 | MEDICATED_PAD | Freq: Every day | CUTANEOUS | Status: DC
  Administered 2017-09-05 – 2017-09-11 (×5): 6 via TOPICAL

## 2017-09-04 MED ORDER — MIDAZOLAM HCL 2 MG/2ML IJ SOLN
INTRAMUSCULAR | Status: AC
  Filled 2017-09-04: qty 2

## 2017-09-04 MED ORDER — ASPIRIN 81 MG PO CHEW
81.0000 mg | CHEWABLE_TABLET | Freq: Every day | ORAL | Status: DC
  Administered 2017-09-04 – 2017-09-12 (×9): 81 mg via ORAL
  Filled 2017-09-04 (×8): qty 1

## 2017-09-04 MED ORDER — CHLORHEXIDINE GLUCONATE CLOTH 2 % EX PADS: 6.0000 | MEDICATED_PAD | Freq: Every day | CUTANEOUS | Status: DC

## 2017-09-04 MED ORDER — FENTANYL CITRATE (PF) 100 MCG/2ML IJ SOLN
100.0000 ug | Freq: Once | INTRAMUSCULAR | Status: AC
  Administered 2017-09-04: 100 ug via INTRAVENOUS

## 2017-09-04 MED ORDER — SODIUM CHLORIDE 0.9% FLUSH: 10.0000 mL | INTRAVENOUS | Status: DC | PRN

## 2017-09-04 MED ORDER — VITAL HIGH PROTEIN PO LIQD: 1000.0000 mL | ORAL | Status: DC

## 2017-09-04 MED ORDER — FENTANYL CITRATE (PF) 100 MCG/2ML IJ SOLN
12.5000 ug | INTRAMUSCULAR | Status: DC | PRN
  Filled 2017-09-04: qty 2

## 2017-09-04 MED ORDER — SODIUM CHLORIDE 0.9% FLUSH
10.0000 mL | Freq: Two times a day (BID) | INTRAVENOUS | Status: DC
  Administered 2017-09-04 – 2017-09-12 (×12): 10 mL

## 2017-09-04 MED ORDER — FENTANYL 2500MCG IN NS 250ML (10MCG/ML) PREMIX INFUSION
25.0000 ug/h | INTRAVENOUS | Status: DC
  Administered 2017-09-04: 25 ug/h via INTRAVENOUS
  Filled 2017-09-04: qty 250

## 2017-09-04 MED ORDER — FENTANYL CITRATE (PF) 100 MCG/2ML IJ SOLN: 50.0000 ug | Freq: Once | INTRAMUSCULAR | Status: DC

## 2017-09-04 MED ORDER — METHYLPREDNISOLONE SODIUM SUCC 40 MG IJ SOLR: 40.0000 mg | Freq: Two times a day (BID) | INTRAMUSCULAR | Status: DC

## 2017-09-04 MED ORDER — PRO-STAT SUGAR FREE PO LIQD
30.0000 mL | Freq: Two times a day (BID) | ORAL | Status: DC
  Administered 2017-09-04 – 2017-09-06 (×5): 30 mL
  Filled 2017-09-04 (×5): qty 30

## 2017-09-04 NOTE — Progress Notes (Signed)
ABG with mild respiratory alkalosis. Will ask RT to decrease RR from 28 to 24.

## 2017-09-04 NOTE — Progress Notes (Signed)
At 1930 while assessing the patient, she got very agitated and attempted to pull out her ETT. Was successful with pulling out esophageal probe. NP Georgann Housekeeper at bedside. Orders for sedation, restraints, and STAT chest x-ray given.

## 2017-09-04 NOTE — Consult Note (Addendum)
The patient has been seen in conjunction with Reino Bellis, NP. All aspects of care have been considered and discussed. The patient has been personally interviewed, examined, and all clinical data has been reviewed.   In-hospital cardiac arrest in a patient with multiple risk factors for coronary disease including insulin-dependent diabetes mellitus, chronic kidney disease, hypertension, hyperlipidemia, and coronary calcification on noncardiac CT.  Trivial troponin bump post cardiac arrest.  Cardiac arrest may have been more respiratory than cardiac and had the features of PEA/bradycardia asystolic.  Brief CPR was performed.  It seems certain that the patient has coronary disease.  It is not clear that coronary disease was responsible for the cardiac arrest.  The appearance of wall motion abnormality on echo is likely related to hypoxia and low flow during the cardiac arrest.  All prior EKG's seem to demonstrate that the patient had prior inferior infarction.  It is also possible that the respiratory failure could be related to acute on chronic combined systolic and diastolic heart failure.  Continue supportive measures.  Aspirin as tolerated.  Withhold IV heparin because the patient's hemoglobin is dropping.  If enzymes increase significantly, IV heparin may be required.  Beta-blocker therapy, high intensity statin therapy, as tolerated by clinical course.  Eventual coronary angiography when clinically improved (pulmonary and kidney function).  Cardiology Consult    Patient ID: Danese Dorsainvil MRN: [id number], DOB/AGE: 1956/04/05   Admit date: 08/29/2017 Date of Consult: 09/04/2017  Primary Physician: Patient, No Pcp Per Primary Cardiologist: New Requesting Provider: Dr. Jimmey Ralph Reason for Consultation: PEA arrest, + Troponin  Sheala Inocencio is a 62 y.o. female who is being seen today for the evaluation of PEA arrest and + troponin at the request of Dr. Jimmey Ralph.   Patient Profile    62 yo  female with PMH of diastolic HF, IDDM, CKD, and HTN who presented on 08/29/17 with shortness of breath and hypoxia. Admitted with PNA/CHF. Had a PEA arrest on 09/03/17, was intubated and transferred to the ICU.   Past Medical History   Past Medical History:  Diagnosis Date  . CKD (chronic kidney disease)   . Diabetes mellitus without complication ([place])   . Hypertension     Past Surgical History:  Procedure Laterality Date  . APPENDECTOMY    . BACK SURGERY    . TOTAL KNEE ARTHROPLASTY Right 2007     Allergies  Allergies  Allergen Reactions  . Azithromycin Swelling    History of Present Illness    Mrs. Bogie is a 62 yo female with PMH of diastolic HF, IDDM, CKD, and HTN. She was admitted on 08/29/17 for shortness of breath and hypoxia.  At the time of admission she reported progressively worsening dyspnea on exertion since recently to this area.  Originally from [place], and moved here recently with her boyfriend.  On admission she was treated for acute diastolic heart failure, with concern for right lower lung pneumonia.  She was placed on antibiotics.  Flu/RSV was negative, and she was started on IV Lasix for diuresis.  BNP on admission was 1016, and chest x-ray was notable for pulmonary edema.  CT chest on 120 demonstrated opacities in the upper lobes consistent with pulmonary edema.  Labs have been notable for up trending WBC count.   On day 5 of her inpatient admission she was noted to be unresponsive and pulseless with subsequent CODE BLUE initiated on the floor.  Laparoscopy after approximately 1 minute of CPR, and was subsequently intubated.  It was  reported that approximately an hour prior to the code patient was noted to be extremely anxious, wheezing and was placed on nonrebreather given her dyspnea.  She was also given IV Lasix, and IV Ativan.  Shortly after was noted to be lying prone and pulseless.  PCN was consulted and the patient was moved to the ICU for further management.   Decision was made to start him with direct pressure.  She was started on a Lasix drip as well.  On admission had TTE done that showed EF of 50-55% with no wall motion abnormality and grade 2 diastolic dysfunction, along with moderate LVH.  Post arrest repeat TTE was ordered and noted slight decline in EF to 45% but with new akinesis in the basal/mid inferior myocardium with severe hypokinesis in the mid/apical inferior lateral myocardium.  Troponin from this morning was 0.14.  Does have reported history of CKD with creatinine on admission 1.4, which is now trended up to 2.3 post arrest.  EKG on admission Q waves in lead III and aVF with slight ST elevation in aVR. No significant changes post arrest. Underwent EEG 1/22 noting diffuse cerebral dysfunction possibly in the setting of anoxic injury.   Inpatient Medications    . midazolam      . aspirin  81 mg Oral Daily  . atorvastatin  40 mg Oral q1800  . budesonide (PULMICORT) nebulizer solution  0.25 mg Nebulization BID  . chlorhexidine gluconate (MEDLINE KIT)  15 mL Mouth Rinse BID  . feeding supplement (PRO-STAT SUGAR FREE 64)  30 mL Per Tube BID  . feeding supplement (VITAL HIGH PROTEIN)  1,000 mL Per Tube Q24H  . gabapentin  100 mg Per Tube TID  . heparin injection (subcutaneous)  5,000 Units Subcutaneous Q8H  . insulin aspart  2-6 Units Subcutaneous Q4H  . ipratropium-albuterol  3 mL Nebulization Q6H  . loratadine  10 mg Oral Daily  . magnesium hydroxide  30 mL Per Tube Daily  . mouth rinse  15 mL Mouth Rinse 10 times per day  . montelukast  10 mg Per Tube QHS  . pantoprazole (PROTONIX) IV  40 mg Intravenous Daily  . pneumococcal 23 valent vaccine  0.5 mL Intramuscular Tomorrow-1000  . potassium chloride  30 mEq Oral Once    Family History    History reviewed. No pertinent family history.  Social History    Social History   Socioeconomic History  . Marital status: Unknown    Spouse name: Not on file  . Number of children: Not  on file  . Years of education: Not on file  . Highest education level: Not on file  Social Needs  . Financial resource strain: Not on file  . Food insecurity - worry: Not on file  . Food insecurity - inability: Not on file  . Transportation needs - medical: Not on file  . Transportation needs - non-medical: Not on file  Occupational History  . Not on file  Tobacco Use  . Smoking status: Never Smoker  . Smokeless tobacco: Never Used  Substance and Sexual Activity  . Alcohol use: No    Frequency: Never  . Drug use: No  . Sexual activity: Not on file  Other Topics Concern  . Not on file  Social History Narrative  . Not on file     Review of Systems    Obtained from chart as patient is intubated/sedated.   All other systems reviewed and are otherwise negative except as noted above.  Physical Exam    Blood pressure (!) 144/89, pulse 90, temperature 98.6 F (37 C), resp. rate (!) 21, height 5' 4"  (1.626 m), weight 178 lb 2.1 oz (80.8 kg), SpO2 95 %.  General: Intubated on vent. Neuro: Sedated HEENT: ETT in place.   Neck: Supple, + JVD. Lungs:  Resp regular and unlabored, course breath sounds. Heart: RRR no s3, s4, or murmurs. Abdomen: Soft, non-tender, non-distended, BS + x 4.  Extremities: No clubbing, cyanosis, trace LE edema. DP/PT/Radials 2+ and equal bilaterally.  Labs    Troponin (Point of Care Test) No results for input(s): TROPIPOC in the last 72 hours. Recent Labs    09/04/17 0642  TROPONINI 0.14*   Lab Results  Component Value Date   WBC 16.1 (H) 09/04/2017   HGB 7.4 (L) 09/04/2017   HCT 23.0 (L) 09/04/2017   MCV 65.2 (L) 09/04/2017   PLT 328 09/04/2017    Recent Labs  Lab 09/03/17 0523  09/04/17 0403  NA 141   < > 143  K 6.5*   < > 3.8  CL 107   < > 105  CO2 23   < > 23  BUN 37*   < > 50*  CREATININE 2.07*   < > 2.31*  CALCIUM 8.5*   < > 8.3*  PROT 6.4*  --   --   BILITOT 0.6  --   --   ALKPHOS 84  --   --   ALT 30  --   --   AST 58*   --   --   GLUCOSE 156*   < > 162*   < > = values in this interval not displayed.   Lab Results  Component Value Date   CHOL 168 08/30/2017   HDL 35 (L) 08/30/2017   LDLCALC 110 (H) 08/30/2017   TRIG 85 09/03/2017   No results found for: [facility]   Radiology Studies    Dg Chest 2 View  Result Date: 08/29/2017 CLINICAL DATA:  Cough and body ache EXAM: CHEST  2 VIEW COMPARISON:  None. FINDINGS: No large effusion. Cardiomegaly with vascular congestion. Diffuse interstitial and mild alveolar opacity with more confluent opacity at the right base. Aortic atherosclerosis. No pneumothorax. IMPRESSION: 1. Cardiomegaly with vascular congestion. Diffuse interstitial opacities suggests underlying edema. 2. More confluent opacity at the right greater than left lung base could reflect superimposed pneumonia Electronically Signed   By: Donavan Foil M.D.   On: 08/29/2017 15:52   Dg Abd 1 View  Result Date: 09/03/2017 CLINICAL DATA:  Feeding tube placement EXAM: ABDOMEN - 1 VIEW COMPARISON:  None. FINDINGS: Nasogastric tube tip and side-port in the stomach. Reticular opacities throughout the visualized lung bases. IMPRESSION: Enteric tube tip and side-port in the stomach. Electronically Signed   By: Ulyses Jarred M.D.   On: 09/03/2017 05:09   Ct Head Wo Contrast  Result Date: 09/03/2017 CLINICAL DATA:  Altered mental status. EXAM: CT HEAD WITHOUT CONTRAST TECHNIQUE: Contiguous axial images were obtained from the base of the skull through the vertex without intravenous contrast. COMPARISON:  None. FINDINGS: Brain: No evidence of acute infarction, hemorrhage, hydrocephalus, extra-axial collection or mass lesion/mass effect. Mild basal ganglia calcifications. Vascular: No hyperdense vessel or unexpected calcification. Skull: Normal. Negative for fracture or focal lesion. Sinuses/Orbits: Paranasal sinuses are well developed and well aerated as there is subtle opacification over the left anterior ethmoid air cells.  There is moderate opacification over the right mastoid air cells. Other: None. IMPRESSION: No acute  findings. Opacification over the right mastoid sinus and minimal chronic inflammatory change of the left ethmoid sinus. Electronically Signed   By: Marin Olp M.D.   On: 09/03/2017 12:08   Ct Chest Wo Contrast  Result Date: 09/01/2017 CLINICAL DATA:  Pt has SOB and a very bad cough. She is on 3 lt of oxygen and could still not breath well EXAM: CT CHEST WITHOUT CONTRAST TECHNIQUE: Multidetector CT imaging of the chest was performed following the standard protocol without IV contrast. COMPARISON:  Chest radiograph 08/31/2017 FINDINGS: Cardiovascular: Coronary artery calcification and aortic atherosclerotic calcification. Mediastinum/Nodes: No axillary or supraclavicular adenopathy. Enlarged mediastinal lymph nodes. RIGHT lower paratracheal lymph node measures 14 mm. Subcarinal lymph node measures 23 mm. Lungs/Pleura: Extensive ground-glass opacities and mosaic pattern in the upper lobes predominantly. No pleural fluid. No pneumothorax. Airways normal. Upper Abdomen: Limited view of the liver, kidneys, pancreas are unremarkable. Normal adrenal glands. Musculoskeletal: No aggressive osseous lesion. IMPRESSION: 1. Severe ground-glass opacities in upper lobe mosaic pattern most consistent with pulmonary edema. 2. Mediastinal lymphadenopathy is favored reactive. Electronically Signed   By: Suzy Bouchard M.D.   On: 09/01/2017 18:53   US Renal  Result Date: 08/29/2017 CLINICAL DATA:  62 year old female with chronic kidney disease. EXAM: RENAL / URINARY TRACT ULTRASOUND COMPLETE COMPARISON:  None. FINDINGS: Right Kidney: Length: 9.1 cm. There is diffuse increased renal echogenicity. No hydronephrosis or shadowing stone. Mild renal pelviectasis. Left Kidney: Length: 11 cm. Minimally increased echogenicity. No hydronephrosis or shadowing stone. Bladder: Appears normal for degree of bladder distention. Trace left  pleural effusion noted. IMPRESSION: Mildly atrophic and echogenic right kidney. Clinical correlation is recommended. No hydronephrosis or shadowing stone. Trace left pleural effusion. Electronically Signed   By: Anner Crete M.D.   On: 08/29/2017 22:39   Dg Chest Port 1 View  Result Date: 09/04/2017 CLINICAL DATA:  Acute congestive heart failure. Acute respiratory failure with hypoxia. Chronic kidney disease. EXAM: PORTABLE CHEST 1 VIEW COMPARISON:  09/03/2017 FINDINGS: Endotracheal tube and nasogastric tube remain in appropriate position. Patient is rotated to the left. Stable cardiomegaly. Bilateral diffuse pulmonary airspace disease shows some mild improvement allowing for differences in radiographic technique. No pneumothorax visualized. IMPRESSION: Stable cardiomegaly. Mild improvement in diffuse bilateral pulmonary airspace disease. Electronically Signed   By: Earle Gell M.D.   On: 09/04/2017 08:54   Dg Chest Port 1 View  Result Date: 09/03/2017 CLINICAL DATA:  Endotracheal intubation EXAM: PORTABLE CHEST 1 VIEW COMPARISON:  Chest CT 09/01/2017 FINDINGS: Endotracheal tube tip is at the level of the clavicular heads. There diffuse reticular opacities throughout both lungs. Small right pleural effusion. IMPRESSION: 1. Radiographically appropriate position of endotracheal tube. 2. Diffuse interstitial opacities, most consistent with pulmonary edema. Electronically Signed   By: Ulyses Jarred M.D.   On: 09/03/2017 05:10   Dg Chest Port 1 View  Result Date: 08/31/2017 CLINICAL DATA:  62 year old female with increased shortness of breath for several days. Recent cough and body ache. EXAM: PORTABLE CHEST 1 VIEW COMPARISON:  08/29/2017 FINDINGS: Portable AP upright view at 1241 hours. Continued diffuse bilateral increased pulmonary interstitial opacity, obscuring the hilar contours. Underlying cardiomegaly. Trace pleural fluid again suspected in the fissures, but no other pleural effusion is evident.  Calcified aortic atherosclerosis. No pneumothorax. Associated bibasilar hypo ventilation appears stable without definite consolidation. IMPRESSION: 1. Moderate to severe bilateral pulmonary interstitial opacity has not improved since 08/29/2017 and is nonspecific. Top differential considerations include acute pulmonary edema (slightly favored due to cardiomegaly and trace pleural  fluid), viral/atypical respiratory infection, and chronic interstitial lung disease. 2. Cardiomegaly.  Aortic Atherosclerosis (ICD10-I70.0). Electronically Signed   By: Genevie Ann M.D.   On: 08/31/2017 14:40    ECG & Cardiac Imaging    EKG: ST with Q waves in lead III, and aVR  Echo: 08/30/17  Study Conclusions  - Left ventricle: The cavity size was severely dilated. There was   moderate concentric hypertrophy. Systolic function was normal.   The estimated ejection fraction was in the range of 50% to 55%.   Wall motion was normal; there were no regional wall motion   abnormalities. Features are consistent with a pseudonormal left   ventricular filling pattern, with concomitant abnormal relaxation   and increased filling pressure (grade 2 diastolic dysfunction).   Doppler parameters are consistent with high ventricular filling   pressure. - Aortic valve: Mildly to moderately calcified annulus. Trileaflet;   normal thickness, mildly calcified leaflets. - Mitral valve: Calcified annulus. Mild diffuse calcification of   the anterior leaflet and posterior leaflet. There was mild   regurgitation. - Left atrium: The atrium was mildly dilated. - Atrial septum: Echo contrast study showed no shunt. - Pulmonary arteries: PA peak pressure: 33 mm Hg (S).  Impressions:  - Dilated LV with moderate LVH, low normal LVF EF 50-55%, MAC with   mildly Ca+ MV leaflets and mild MR, mild LAE, grade 2 DD, mild to   moderate aortic annular calcification, trivial TR, dilated IVC.  TTE: 09/03/17  Study Conclusions  - Left  ventricle: The cavity size was mildly dilated. There was   mild concentric hypertrophy. Systolic function was mildly   reduced. The estimated ejection fraction was in the range of 45%   to 50%. There is akinesis of the basal-midinferior myocardium.   There is severe hypokinesis of the mid-apicalinferolateral   myocardium. - Aortic valve: Mildly to moderately calcified annulus. Trileaflet;   mildly thickened, mildly calcified leaflets. - Mitral valve: Calcified annulus. Mild focal calcification of the   anterior leaflet. There was trivial regurgitation. - Left atrium: The atrium was mildly dilated. - Tricuspid valve: There was trivial regurgitation. - Impressions: mildly reduced LVF with EF 45-50% with basal   inferior AK and severe mid to apical inferolateral HK, mildly   calcified anterior MV leaflet with mild MR, moderately calcified   AV annulus with mild AVSC, mild LAE, trivial TR. Marland KitchenCompared to   study 08/30/2017, the inferolateral wall is now severely   hypokinetic and LVF appears to have declined some.  Impressions:  - mildly reduced LVF with EF 45-50% with basal inferior AK and   severe mid to apical inferolateral HK, mildly calcified anterior   MV leaflet with mild MR, moderately calcified AV annulus with   mild AVSC, mild LAE, trivial TR. Marland KitchenCompared to study 08/30/2017,   the inferolateral wall is now severely hypokinetic and LVF   appears to have declined some.  Assessment & Plan    62 yo female with PMH of diastolic HF, IDDM, CKD, and HTN who presented on 08/29/17 with shortness of breath and hypoxia. Admitted with PNA/CHF. Had a PEA arrest on 09/03/17, was intubated and transferred to the ICU.   1. PEA Arrest/hypoxic respiratory failure: On 09/03/17 while inpatient. Brief CPR with ROSC after 1 minute. Was cooled, remains intubated and sedated. PCCM managing.   2. Acute on chronic combined HF with positive troponin: Echo 1/18 showed EF of 50% with no WMA, but repeat echo 1/22  showed slight drop in EF  45% with akinesis in the basal mid inferior with apical inferolateral myocardium. Does have CRFs with HL, HTN and IDDM. Troponin this morning was 0.14. Given change on echo, will need ischemic evaluation pending neurological status. Considered adding IV heparin, but with her anemia will discuss with MD.  -- on ASA, statin -- trend troponins -- remains on lasix gtt, volume status appear stable on exam.  -- central line placed via PCCM for CVP measurements. Further management based on readings.   3. Acute encephalopathy: EEG noted diffuse changes. Remains on sedation.   4. Anemia: Downward trend since admission. 9.1>>8.2>>7.4.  -- Anemia panel pending, transfuse per primary  5. HL: on high dose statin - check lipids  6. IDDM: On SSI while inpt -- check Hgb A1c   Signed, Reino Bellis, NP-C Pager [phone] 09/04/2017, 11:11 AM

## 2017-09-04 NOTE — Progress Notes (Signed)
PULMONARY / CRITICAL CARE MEDICINE   Name: Brenda Sims MRN: [id number] DOB: 09/24/1955    ADMISSION DATE:  08/29/2017 CONSULTATION DATE:  09/03/2017  REFERRING MD:  [facility], Dron Tanna Furry MD  CHIEF COMPLAINT:  PEA arrest and respiratory distress  HISTORY OF PRESENT ILLNESS:   10 yoF with CHF, insulin dependent diabetes, CKD and HTN who presented to [facility] for dyspnea and hypoxia. She was admitted for acute on chronic congestive heart failure. At the time of admission she had attested to progressively worsening dyspnea with exhertion since moving to the area approximately one month prior. She was noted to be hypoxic in urgent care where she was sent to the ED. She reported subjective fever, chills, nausea, headache, dizziness, abdominal pain, dysuria, urgency, diarrhea and constipation.   She was treated for acute HFpEF, cardiopulmonary edema, with notable concern for RLL CAP. The patient was started on antibiotics, Flu/RSV negative, and lasix for diuresis. BNP was elevated to 1016 with CXR chest indicative of pulmonary edema with mild confluent opacity at the right base. CT chest on 1/20 demonstrated ground-glass opacities in the upper lobe with a mosaic pattern most consistent w/ pulmonary edema with reactive mediastinal lymphadenopathy. WBC count trending upward since admission, anemic with Hgb stable since admission.   Day 5 of admission: Patient noted to be unresponsive and pulseless with subsequent code blue initiated. ROSC was achieved after approximately 1 minute but the patient was noted have agonal breathing. She was subsequently intubated.   Approximately one hour prior to the code, the patient was noted to be extremely anxious, wheezing, with normal SpO2 sats. She was placed on a nonrebreather due to her concern of dyspnea, given lasix 40mg  IV, and ativan 1 mg IV. She coughed, rolled over. She was noted to be lying prone and pulses at that point.    SUBJECTIVE:  Sedated on ventilator  VITAL SIGNS: BP (!) 142/68   Pulse 62   Temp 98.1 F (36.7 C) (Esophageal)   Resp (!) 24   Ht 5\' 4"  (1.626 m)   Wt 178 lb 2.1 oz (80.8 kg)   SpO2 100%   BMI 30.58 kg/m   HEMODYNAMICS:    VENTILATOR SETTINGS: Vent Mode: PRVC FiO2 (%):  [40 %] 40 % Set Rate:  [24 bmp-28 bmp] 24 bmp Vt Set:  [470 mL] 470 mL PEEP:  [8 cmH20-12 cmH20] 8 cmH20 Plateau Pressure:  [28 cmH20-31 cmH20] 28 cmH20  INTAKE / OUTPUT:  Intake/Output Summary (Last 24 hours) at 09/04/2017 1004 Last data filed at 09/04/2017 0900 Gross per 24 hour  Intake 1522.75 ml  Output 2905 ml  Net -1382.25 ml    PHYSICAL EXAMINATION: General: This is a 62 year old female currently sedated on ventilator. HEENT: Normocephalic atraumatic no jugular venous distention orally intubated Pulmonary: Scattered rhonchi, equal chest rise, weaning PEEP/FiO2 Cardiac: Regular rate and rhythm Abdomen: Soft nontender positive bowel sounds no organomegaly Extremities/musculoskeletal: Equal strength and bulk brisk cap refill no significant edema Neuro: Minimally responsive does localize no focal deficits  LABS:  BMET Recent Labs  Lab 09/03/17 1032 09/03/17 2051 09/04/17 0403  NA 141 143 143  K 6.0* 4.2 3.8  CL 104 105 105  CO2 22 21* 23  BUN 41* 46* 50*  CREATININE 2.04* 2.33* 2.31*  GLUCOSE 100* 134* 162*   Electrolytes Recent Labs  Lab 09/02/17 0437  09/03/17 0523 09/03/17 1032 09/03/17 2051 09/04/17 0403  CALCIUM 8.2*   < > 8.5* 8.4* 8.4* 8.3*  MG 1.9  --   --  2.0  --  2.3  PHOS  --   --  6.3* 6.1*  --  8.1*   < > = values in this interval not displayed.   CBC Recent Labs  Lab 09/01/17 0756 09/03/17 0523 09/04/17 0403  WBC 16.3* 17.6* 16.1*  HGB 8.2* 7.4* 7.4*  HCT 25.9* 23.7* 23.0*  PLT 348 317 328   Coag's Recent Labs  Lab 09/03/17 0523  APTT 41*  INR 1.30    Sepsis Markers Recent Labs  Lab 09/03/17 0523 09/04/17 0403  LATICACIDVEN 0.8  --    PROCALCITON 0.74 1.14    ABG Recent Labs  Lab 09/03/17 2058 09/04/17 0213 09/04/17 0415  PHART 7.446 7.407 7.388  PCO2ART 34.8 39.0 42.8  PO2ART 153.0* 148.0* 175*    Liver Enzymes Recent Labs  Lab 09/03/17 0523  AST 58*  ALT 30  ALKPHOS 84  BILITOT 0.6  ALBUMIN 2.6*    Cardiac Enzymes Recent Labs  Lab 09/04/17 0642  TROPONINI 0.14*    Glucose Recent Labs  Lab 09/03/17 1214 09/03/17 1519 09/03/17 1939 09/03/17 2315 09/04/17 0317 09/04/17 0737  GLUCAP 116* 132* 140* 163* 172* 136*   Imaging Ct Head Wo Contrast  Result Date: 09/03/2017 CLINICAL DATA:  Altered mental status. EXAM: CT HEAD WITHOUT CONTRAST TECHNIQUE: Contiguous axial images were obtained from the base of the skull through the vertex without intravenous contrast. COMPARISON:  None. FINDINGS: Brain: No evidence of acute infarction, hemorrhage, hydrocephalus, extra-axial collection or mass lesion/mass effect. Mild basal ganglia calcifications. Vascular: No hyperdense vessel or unexpected calcification. Skull: Normal. Negative for fracture or focal lesion. Sinuses/Orbits: Paranasal sinuses are well developed and well aerated as there is subtle opacification over the left anterior ethmoid air cells. There is moderate opacification over the right mastoid air cells. Other: None. IMPRESSION: No acute findings. Opacification over the right mastoid sinus and minimal chronic inflammatory change of the left ethmoid sinus. Electronically Signed   By: Marin Olp M.D.   On: 09/03/2017 12:08   Dg Chest Port 1 View  Result Date: 09/04/2017 CLINICAL DATA:  Acute congestive heart failure. Acute respiratory failure with hypoxia. Chronic kidney disease. EXAM: PORTABLE CHEST 1 VIEW COMPARISON:  09/03/2017 FINDINGS: Endotracheal tube and nasogastric tube remain in appropriate position. Patient is rotated to the left. Stable cardiomegaly. Bilateral diffuse pulmonary airspace disease shows some mild improvement allowing for  differences in radiographic technique. No pneumothorax visualized. IMPRESSION: Stable cardiomegaly. Mild improvement in diffuse bilateral pulmonary airspace disease. Electronically Signed   By: Earle Gell M.D.   On: 09/04/2017 08:54    STUDIES:  1/17 Renal US >> atrophic right kidney but no hydro Mildly atrophic and echogenic right kidney. Clinical correlation is recommended. No hydronephrosis or shadowing stone; Trace left pleural effusion.  TTE 1/18 >> Dilated LV with moderate LVH, low normal LVF EF 50-55%, mitral calcified annulus withmildly Ca+ MV leaflets and mild MR, mild LAE, grade 2 DD, mild to moderate aortic annular calcification, trivial TR, dilated IVC. TTE 1/22: The cavity size was mildly dilated. There was mild concentric hypertrophy. Systolic function was mildly   reduced. The estimated ejection fraction was in the range of 45%  to 50%. There is akinesis of the basal-midinferior myocardium.  There is severe hypokinesis of the mid-apicalinferolateral myocardium. 1/20 CT chest >> 1. Severe ground-glass opacities in upper lobe mosaic pattern most consistent with pulmonary edema. 2. Mediastinal lymphadenopathy is favored reactive.  EEG 1/22: severe diffuse cerebral dysfunction that is non-specific in etiology and can be  seen in the setting of anoxic/ischemic injury, toxic/metabolic encephalopathies, or sedating medications  CULTURES: 1/20 RVP >> negative 1/20 MRSA PCR >>  ANTIBIOTICS: 1/17 Levaquin >> 1/20 1/20 Augmentin >>1/22 1/22 unasyn>>>  SIGNIFICANT EVENTS: 1/17  Admit 1/22  Cardiac arrest ~1 min  LINES/TUBES: PIV  1/22 ETT>>> 1/22 OGT>>> 1/22 Foley>>> 1/22 L radial a-line>>>  DISCUSSION: 33 yoF admitted with VDRF post PEA arrest.  ASSESSMENT / PLAN:  PEA Arrest - brief 1 min CPR Acute on chronic systolic/diastolic heart failure w/ new NSTEMI Prolonged QTc Hx HTN, HLD Plan Will ask cards to see given new ECHO changes  Cont tele  MAP goal  >65 Transduce CVP Cont lasix; will assess CVP  Cont asa and lipitor  Cont PRN apresoline for now as we are starting precedex    Acute hypoxic respiratory failure in setting of CAP complicated by acute pulmonary edema +/- element of ALI Tobacco abuse Hx mild asthma Plan Cont full vent support Scheduled BDs & add ICS Cont lasix (at reduced dosing of 4mg /hr for now but assess CVP) Dc systemic steroids.   Acute encephalopathy -->EEG w/ diffuse changes  Plan PAD protocol RAS goal -1 precedex PRN fent Cont neurontin, celexa and xanax Supportive care Cont normothermia protocol  AKI on CKD  Plan Renal dose meds Strict I&O Cont lasix at 4 mg/hr (assess CVP to decide on further dosing) F/u am chem    Fluid and electrolyte imbalance Plan Replace K empirically.  Strict I&O  Anemia, ? IDA  Plan Ck anemia panel  Trend CBC Transfuse per protocol   IDDM  Plan:   ssi protocol   DVT prophylaxis: [place] heparin SUP: PPI  Diet: start tubefeeds Activity: BR Disposition : ICU  FAMILY  - Updates: No family at bedside to updated.  - Inter-disciplinary family meet or Palliative Care meeting due by:  09/11/2017  My cct 77 min   Erick Colace ACNP-BC [place] Pager # [phone] OR # [phone] if no answer  Attending Note:  62 year old female s/p PEA arrest undergoing cooling.  Patient underwent normothermia and on exam localizes to pain but remains unresponsive.  I reviewed CXR myself, ETT is ok with pulmonary edema noted.  Will continue to attempt to minimze sedation.  Decrease pressors as able.  Diureses as BP permits.  Hold off weaning for now.  PCCM will continue to follow.  The patient is critically ill with multiple organ systems failure and requires high complexity decision making for assessment and support, frequent evaluation and titration of therapies, application of advanced monitoring technologies and extensive interpretation of multiple  databases.   Critical Care Time devoted to patient care services described in this note is  35  Minutes. This time reflects time of care of this signee Dr Jennet Maduro. This critical care time does not reflect procedure time, or teaching time or supervisory time of PA/NP/Med student/Med Resident etc but could involve care discussion time.  Rush Farmer, M.D. [facility] Pulmonary/Critical Care Medicine. Pager: [phone]. After hours pager: [phone].

## 2017-09-04 NOTE — Progress Notes (Signed)
Patient started slowly rewarming at 0545. Verified with Scheryl Darter, RN.

## 2017-09-04 NOTE — Procedures (Signed)
Central Venous Catheter Insertion Procedure Note Brenda Sims [id number] 1956-06-07  Procedure: Insertion of Central Venous Catheter Indications: Assessment of intravascular volume, Drug and/or fluid administration and Frequent blood sampling  Procedure Details Consent: Risks of procedure as well as the alternatives and risks of each were explained to the (patient/caregiver).  Consent for procedure obtained. Time Out: Verified patient identification, verified procedure, site/side was marked, verified correct patient position, special equipment/implants available, medications/allergies/relevent history reviewed, required imaging and test results available.  Performed  Maximum sterile technique was used including antiseptics, cap, gloves, gown, hand hygiene, mask and sheet. Skin prep: Chlorhexidine; local anesthetic administered A antimicrobial bonded/coated triple lumen catheter was placed in the right subclavian vein using the Seldinger technique.  Evaluation Blood flow good Complications: No apparent complications Patient did tolerate procedure well. Chest X-ray ordered to verify placement.  CXR: pending.  Clementeen Graham 09/04/2017, 11:25 AM  Erick Colace ACNP-BC [place] Pager # [phone] OR # [phone] if no answer  Rush Farmer, M.D. [facility] Pulmonary/Critical Care Medicine. Pager: [phone]. After hours pager: [phone].

## 2017-09-04 NOTE — Progress Notes (Signed)
Nutrition Follow-up  DOCUMENTATION CODES:   Obesity unspecified  INTERVENTION:   Tube Feeding:  Vital High Protein @ 40 ml/hr Pro-Stat 30 mL BID Provides 115 g of protein, 1160 kcals and 806 mL of free water Meets 100% estimated calorie and protein needs  NUTRITION DIAGNOSIS:   Inadequate oral intake related to acute illness as evidenced by NPO status.  Being addressed via TF  GOAL:   Provide needs based on ASPEN/SCCM guidelines  Progressing  MONITOR:   Vent status, Labs, Weight trends  REASON FOR ASSESSMENT:   Ventilator    ASSESSMENT:   62 yo admitted with acute respiratory failure in setting of acute on chronic CHF, LLL pneumonia. Pt with hx of CKD, DM, HTN  Pt remains on vent support Diprivan d/c TTM (36 degrees), rewarming started at 0545  Consult received for initiation of TF OG tube in place  Noted weight trended back down to admission wt. Monitor wt and adjust nutritional needs as needed.   Labs: reviewed Meds: lasix drip, predecex, fentanyl  Diet Order:  Diet NPO time specified  EDUCATION NEEDS:   Not appropriate for education at this time  Skin:  Skin Assessment: Reviewed RN Assessment  Last BM:  1/19  Height:   Ht Readings from Last 1 Encounters:  08/29/17 5\' 4"  (1.626 m)    Weight:   Wt Readings from Last 1 Encounters:  09/04/17 178 lb 2.1 oz (80.8 kg)    Ideal Body Weight:  54.5 kg  BMI:  Body mass index is 30.58 kg/m.  Estimated Nutritional Needs:   Kcal:  212-2482 kcals  Protein:  109-136 g  Fluid:  >/= 1.5 L   Kerman Passey MS, RD, LDN, CNSC [phone] Pager  [phone] Weekend/On-Call Pager

## 2017-09-05 DIAGNOSIS — I2510 Atherosclerotic heart disease of native coronary artery without angina pectoris: Secondary | ICD-10-CM

## 2017-09-05 DIAGNOSIS — I25119 Atherosclerotic heart disease of native coronary artery with unspecified angina pectoris: Secondary | ICD-10-CM

## 2017-09-05 LAB — CBC
HEMATOCRIT: 24.7 % — AB (ref 36.0–46.0)
Hemoglobin: 8 g/dL — ABNORMAL LOW (ref 12.0–15.0)
MCH: 21.1 pg — ABNORMAL LOW (ref 26.0–34.0)
MCHC: 32.4 g/dL (ref 30.0–36.0)
MCV: 65.2 fL — AB (ref 78.0–100.0)
Platelets: 387 10*3/uL (ref 150–400)
RBC: 3.79 MIL/uL — ABNORMAL LOW (ref 3.87–5.11)
RDW: 22.1 % — AB (ref 11.5–15.5)
WBC: 13 10*3/uL — AB (ref 4.0–10.5)

## 2017-09-05 LAB — LIPID PANEL
CHOL/HDL RATIO: 9.6 ratio
Cholesterol: 153 mg/dL (ref 0–200)
HDL: 16 mg/dL — ABNORMAL LOW (ref >40)
LDL CALC: 90 mg/dL (ref 0–99)
Triglycerides: 235 mg/dL — ABNORMAL HIGH (ref <150)
VLDL: 47 mg/dL — AB (ref 0–40)

## 2017-09-05 LAB — BASIC METABOLIC PANEL
Anion gap: 17 — ABNORMAL HIGH (ref 5–15)
Anion gap: 14 (ref 5–15)
BUN: 76 mg/dL — AB (ref 6–20)
BUN: 62 mg/dL — ABNORMAL HIGH (ref 6–20)
CALCIUM: 8.3 mg/dL — AB (ref 8.9–10.3)
CHLORIDE: 107 mmol/L (ref 101–111)
CO2: 24 mmol/L (ref 22–32)
CO2: 30 mmol/L (ref 22–32)
Calcium: 8.4 mg/dL — ABNORMAL LOW (ref 8.9–10.3)
Chloride: 108 mmol/L (ref 101–111)
Creatinine, Ser: 2.38 mg/dL — ABNORMAL HIGH (ref 0.44–1.00)
Creatinine, Ser: 2.06 mg/dL — ABNORMAL HIGH (ref 0.44–1.00)
GFR calc Af Amer: 24 mL/min — ABNORMAL LOW (ref >60)
GFR calc Af Amer: 29 mL/min — ABNORMAL LOW (ref >60)
GFR calc non Af Amer: 21 mL/min — ABNORMAL LOW (ref >60)
GFR, EST NON AFRICAN AMERICAN: 25 mL/min — AB (ref >60)
GLUCOSE: 131 mg/dL — AB (ref 65–99)
Glucose, Bld: 156 mg/dL — ABNORMAL HIGH (ref 65–99)
POTASSIUM: 3 mmol/L — AB (ref 3.5–5.1)
POTASSIUM: 2.7 mmol/L — AB (ref 3.5–5.1)
SODIUM: 148 mmol/L — AB (ref 135–145)
Sodium: 152 mmol/L — ABNORMAL HIGH (ref 135–145)

## 2017-09-05 LAB — GLUCOSE, CAPILLARY
GLUCOSE-CAPILLARY: 119 mg/dL — AB (ref 65–99)
GLUCOSE-CAPILLARY: 166 mg/dL — AB (ref 65–99)
GLUCOSE-CAPILLARY: 177 mg/dL — AB (ref 65–99)
Glucose-Capillary: 170 mg/dL — ABNORMAL HIGH (ref 65–99)
Glucose-Capillary: 193 mg/dL — ABNORMAL HIGH (ref 65–99)
Glucose-Capillary: 205 mg/dL — ABNORMAL HIGH (ref 65–99)
Glucose-Capillary: 157 mg/dL — ABNORMAL HIGH (ref 65–99)

## 2017-09-05 LAB — HEMOGLOBIN A1C
Hgb A1c MFr Bld: 6.7 % — ABNORMAL HIGH (ref 4.8–5.6)
Mean Plasma Glucose: 145.59 mg/dL

## 2017-09-05 LAB — IRON AND TIBC
Iron: 14 ug/dL — ABNORMAL LOW (ref 28–170)
SATURATION RATIOS: 5 % — AB (ref 10.4–31.8)
TIBC: 295 ug/dL (ref 250–450)
UIBC: 281 ug/dL

## 2017-09-05 LAB — FERRITIN: Ferritin: 87 ng/mL (ref 11–307)

## 2017-09-05 LAB — PROCALCITONIN: Procalcitonin: 0.82 ng/mL

## 2017-09-05 LAB — RETICULOCYTES
RBC.: 3.91 MIL/uL (ref 3.87–5.11)
Retic Count, Absolute: 89.9 10*3/uL (ref 19.0–186.0)
Retic Ct Pct: 2.3 % (ref 0.4–3.1)

## 2017-09-05 LAB — PHOSPHORUS
PHOSPHORUS: 6.3 mg/dL — AB (ref 2.5–4.6)
Phosphorus: 6.6 mg/dL — ABNORMAL HIGH (ref 2.5–4.6)

## 2017-09-05 LAB — FOLATE: FOLATE: 13.4 ng/mL (ref >5.9)

## 2017-09-05 LAB — MAGNESIUM
MAGNESIUM: 2.3 mg/dL (ref 1.7–2.4)
MAGNESIUM: 2.3 mg/dL (ref 1.7–2.4)

## 2017-09-05 LAB — VITAMIN B12: VITAMIN B 12: 239 pg/mL (ref 180–914)

## 2017-09-05 LAB — TROPONIN I: Troponin I: 0.18 ng/mL (ref <0.03)

## 2017-09-05 MED ORDER — FREE WATER
200.0000 mL | Freq: Four times a day (QID) | Status: DC
  Administered 2017-09-05 – 2017-09-06 (×4): 200 mL

## 2017-09-05 MED ORDER — POTASSIUM CHLORIDE 10 MEQ/50ML IV SOLN: 10.0000 meq | INTRAVENOUS | Status: DC | PRN

## 2017-09-05 MED ORDER — AMLODIPINE 1 MG/ML ORAL SUSPENSION
5.0000 mg | Freq: Every day | ORAL | Status: DC
  Administered 2017-09-05: 5 mg
  Filled 2017-09-05 (×3): qty 5

## 2017-09-05 MED ORDER — POTASSIUM CHLORIDE 20 MEQ/15ML (10%) PO SOLN
40.0000 meq | Freq: Once | ORAL | Status: AC
  Administered 2017-09-05: 40 meq via ORAL
  Filled 2017-09-05: qty 30

## 2017-09-05 MED ORDER — PANTOPRAZOLE SODIUM 40 MG PO PACK: 40.0000 mg | PACK | Freq: Every day | ORAL | Status: DC

## 2017-09-05 MED ORDER — FUROSEMIDE 10 MG/ML IJ SOLN
40.0000 mg | Freq: Every day | INTRAMUSCULAR | Status: DC
  Administered 2017-09-05 – 2017-09-06 (×2): 40 mg via INTRAVENOUS
  Filled 2017-09-05 (×3): qty 4

## 2017-09-05 MED ORDER — FENTANYL CITRATE (PF) 100 MCG/2ML IJ SOLN: 100.0000 ug | INTRAMUSCULAR | Status: DC | PRN

## 2017-09-05 MED ORDER — CARVEDILOL 6.25 MG PO TABS
6.2500 mg | ORAL_TABLET | Freq: Two times a day (BID) | ORAL | Status: DC
  Administered 2017-09-05 – 2017-09-12 (×15): 6.25 mg via ORAL
  Filled 2017-09-05 (×15): qty 1

## 2017-09-05 MED ORDER — POTASSIUM CHLORIDE 20 MEQ/15ML (10%) PO SOLN
40.0000 meq | Freq: Once | ORAL | Status: AC
  Administered 2017-09-05: 40 meq
  Filled 2017-09-05: qty 30

## 2017-09-05 NOTE — Progress Notes (Signed)
Progress Note  Patient Name: Brenda Sims Date of Encounter: 09/05/2017  Primary Cardiologist: Linard Millers, New  Subjective   Intubated.  No significant changes overnight.  Has been rewarmed.  Inpatient Medications    Scheduled Meds: . aspirin  81 mg Oral Daily  . atorvastatin  40 mg Oral q1800  . budesonide (PULMICORT) nebulizer solution  0.25 mg Nebulization BID  . carvedilol  6.25 mg Oral BID WC  . chlorhexidine gluconate (MEDLINE KIT)  15 mL Mouth Rinse BID  . Chlorhexidine Gluconate Cloth  6 each Topical Daily  . feeding supplement (PRO-STAT SUGAR FREE 64)  30 mL Per Tube BID  . feeding supplement (VITAL HIGH PROTEIN)  1,000 mL Per Tube Q24H  . free water  200 mL Per Tube Q6H  . furosemide  40 mg Intravenous Daily  . gabapentin  100 mg Per Tube TID  . heparin injection (subcutaneous)  5,000 Units Subcutaneous Q8H  . hydrALAZINE  100 mg Per Tube Q8H  . insulin aspart  2-6 Units Subcutaneous Q4H  . ipratropium-albuterol  3 mL Nebulization Q6H  . loratadine  10 mg Oral Daily  . mouth rinse  15 mL Mouth Rinse 10 times per day  . montelukast  10 mg Per Tube QHS  . pantoprazole sodium  40 mg Per Tube Q1200  . potassium chloride  40 mEq Oral Once  . sodium chloride flush  10-40 mL Intracatheter Q12H   Continuous Infusions: . sodium chloride Stopped (09/04/17 1741)  . ampicillin-sulbactam (UNASYN) IV Stopped (09/05/17 0439)  . dexmedetomidine (PRECEDEX) IV infusion 0.2 mcg/kg/hr (09/05/17 1141)  . niCARDipine 5 mg/hr (09/05/17 0835)   PRN Meds: albuterol, bisacodyl, docusate, fentaNYL (SUBLIMAZE) injection, fentaNYL (SUBLIMAZE) injection, hydrALAZINE, sodium chloride flush   Vital Signs    Vitals:   09/05/17 0800 09/05/17 0815 09/05/17 0900 09/05/17 1000  BP: 127/69  (!) 125/50 (!) 127/54  Pulse: 65  67 66  Resp: (!) 24  (!) 24 (!) 24  Temp: 98.1 F (36.7 C)     TempSrc: Esophageal     SpO2: 100% 100% 100% 100%  Weight:      Height:        Intake/Output Summary  (Last 24 hours) at 09/05/2017 1142 Last data filed at 09/05/2017 1100 Gross per 24 hour  Intake 3086.07 ml  Output 5100 ml  Net -2013.93 ml   Filed Weights   09/03/17 0715 09/04/17 0206 09/05/17 0203  Weight: 186 lb 8.2 oz (84.6 kg) 178 lb 2.1 oz (80.8 kg) 169 lb 8.5 oz (76.9 kg)    Telemetry    Sinus rhythm/sinus tachycardia- Personally Reviewed  ECG    O tracings revealed inferior Q waves.  No new tracing available.- Personally Reviewed  Physical Exam  Obese African-American female, intubated GEN:  Acutely ill appearing. Neck: No JVD Cardiac: RRR, no murmurs, rubs, or gallops.  Respiratory:  Course upper airway sounds and support apparatus noises. GI: Soft, nontender, non-distended  MS: No edema Neuro:   Unable to assess Psych: Unable to assess  Labs    Chemistry Recent Labs  Lab 09/03/17 0523  09/03/17 2051 09/04/17 0403 09/05/17 0349  NA 141   < > 143 143 148*  K 6.5*   < > 4.2 3.8 3.0*  CL 107   < > 105 105 107  CO2 23   < > 21* 23 24  GLUCOSE 156*   < > 134* 162* 156*  BUN 37*   < > 46* 50* 76*  CREATININE 2.07*   < > 2.33* 2.31* 2.38*  CALCIUM 8.5*   < > 8.4* 8.3* 8.4*  PROT 6.4*  --   --   --   --   ALBUMIN 2.6*  --   --   --   --   AST 58*  --   --   --   --   ALT 30  --   --   --   --   ALKPHOS 84  --   --   --   --   BILITOT 0.6  --   --   --   --   GFRNONAA 25*   < > 21* 22* 21*  GFRAA 29*   < > 25* 25* 24*  ANIONGAP 11   < > 17* 15 17*   < > = values in this interval not displayed.     Hematology Recent Labs  Lab 09/03/17 0523 09/04/17 0403 09/05/17 0349  WBC 17.6* 16.1* 13.0*  RBC 3.56* 3.53* 3.79*  HGB 7.4* 7.4* 8.0*  HCT 23.7* 23.0* 24.7*  MCV 66.6* 65.2* 65.2*  MCH 20.8* 21.0* 21.1*  MCHC 31.2 32.2 32.4  RDW 22.0* 22.0* 22.1*  PLT 317 328 387    Cardiac Enzymes Recent Labs  Lab 09/04/17 0642 09/04/17 1556 09/04/17 2136 09/05/17 0349  TROPONINI 0.14* 0.20* 0.19* 0.18*    Recent Labs  Lab 08/29/17 1538  TROPIPOC  0.01     BNP Recent Labs  Lab 08/29/17 1530  BNP 1,016.4*     DDimer No results for input(s): DDIMER in the last 168 hours.   Radiology    Ct Head Wo Contrast  Result Date: 09/03/2017 CLINICAL DATA:  Altered mental status. EXAM: CT HEAD WITHOUT CONTRAST TECHNIQUE: Contiguous axial images were obtained from the base of the skull through the vertex without intravenous contrast. COMPARISON:  None. FINDINGS: Brain: No evidence of acute infarction, hemorrhage, hydrocephalus, extra-axial collection or mass lesion/mass effect. Mild basal ganglia calcifications. Vascular: No hyperdense vessel or unexpected calcification. Skull: Normal. Negative for fracture or focal lesion. Sinuses/Orbits: Paranasal sinuses are well developed and well aerated as there is subtle opacification over the left anterior ethmoid air cells. There is moderate opacification over the right mastoid air cells. Other: None. IMPRESSION: No acute findings. Opacification over the right mastoid sinus and minimal chronic inflammatory change of the left ethmoid sinus. Electronically Signed   By: Marin Olp M.D.   On: 09/03/2017 12:08   Dg Chest Port 1 View  Addendum Date: 09/04/2017   ADDENDUM REPORT: 09/04/2017 21:59 ADDENDUM: Request made to comment on OG tube. As stated in the findings section of the report, the esophageal tube extends below the diaphragm but the tip is not included on the current image. Electronically Signed   By: Donavan Foil M.D.   On: 09/04/2017 21:59   Result Date: 09/04/2017 CLINICAL DATA:  History of ETT EXAM: PORTABLE CHEST 1 VIEW COMPARISON:  09/04/2017, 09/03/2017, 08/29/2017 FINDINGS: Endotracheal tube tip is about 3.7 cm superior to the carina. Esophageal tube tip is below the diaphragm but non included. Slightly improved aeration of the lung bases. Cardiomegaly with vascular congestion. Mild diffuse interstitial opacities suggesting residual edema. Probable small pleural effusions. Aortic  atherosclerosis. Right-sided central venous catheter tip overlies the distal SVC. Negative for a pneumothorax. IMPRESSION: 1. Endotracheal tube tip about 3.7 cm superior to carina 2. Slightly improved aeration of the lung bases. 3. Cardiomegaly with small pleural effusions, mild vascular congestion and diffuse interstitial edema.  Electronically Signed: By: Donavan Foil M.D. On: 09/04/2017 21:27   Dg Chest Port 1 View  Result Date: 09/04/2017 CLINICAL DATA:  central line placement EXAM: PORTABLE CHEST - 1 VIEW COMPARISON:  Earlier film of the same day FINDINGS: Interval placement of right subclavian central venous catheter to the cavoatrial junction. No pneumothorax. Endotracheal tube and nasogastric tube stable in position. Diffuse bilateral edema or infiltrates may be marginally improved. Heart size upper limits normal for technique. Aortic Atherosclerosis (ICD10-170.0) Blunting of right costophrenic angle suggesting effusion. Visualized bones unremarkable. IMPRESSION: 1. Central line to cavoatrial junction without pneumothorax. 2. Slight if any improvement in bilateral edema or infiltrates. Electronically Signed   By: Lucrezia Europe M.D.   On: 09/04/2017 12:16   Dg Chest Port 1 View  Result Date: 09/04/2017 CLINICAL DATA:  Acute congestive heart failure. Acute respiratory failure with hypoxia. Chronic kidney disease. EXAM: PORTABLE CHEST 1 VIEW COMPARISON:  09/03/2017 FINDINGS: Endotracheal tube and nasogastric tube remain in appropriate position. Patient is rotated to the left. Stable cardiomegaly. Bilateral diffuse pulmonary airspace disease shows some mild improvement allowing for differences in radiographic technique. No pneumothorax visualized. IMPRESSION: Stable cardiomegaly. Mild improvement in diffuse bilateral pulmonary airspace disease. Electronically Signed   By: Earle Gell M.D.   On: 09/04/2017 08:54   Dg Abd Portable 1v  Result Date: 09/04/2017 CLINICAL DATA:  Pulled on orogastric tube.   Assess positioning. EXAM: PORTABLE ABDOMEN - 1 VIEW COMPARISON:  Abdominal radiograph September 03, 2016 FINDINGS: Nasogastric tube side port projects in distal stomach, unchanged with side port past GE junction. Including bowel gas pattern is nondilated and nonobstructive. Aortoiliac calcifications. Cardiomegaly and interstitial prominence included chest. IMPRESSION: Similarly position nasogastric tube with distal tip projecting in mid stomach. Aortic Atherosclerosis (ICD10-I70.0). Electronically Signed   By: Elon Alas M.D.   On: 09/04/2017 22:36    Cardiac Studies   Echocardiography, 09/03/2016: Study Conclusions  - Left ventricle: The cavity size was mildly dilated. There was   mild concentric hypertrophy. Systolic function was mildly   reduced. The estimated ejection fraction was in the range of 45%   to 50%. There is akinesis of the basal-midinferior myocardium.   There is severe hypokinesis of the mid-apicalinferolateral   myocardium. - Aortic valve: Mildly to moderately calcified annulus. Trileaflet;   mildly thickened, mildly calcified leaflets. - Mitral valve: Calcified annulus. Mild focal calcification of the   anterior leaflet. There was trivial regurgitation. - Left atrium: The atrium was mildly dilated. - Tricuspid valve: There was trivial regurgitation. - Impressions: mildly reduced LVF with EF 45-50% with basal   inferior AK and severe mid to apical inferolateral HK, mildly   calcified anterior MV leaflet with mild MR, moderately calcified   AV annulus with mild AVSC, mild LAE, trivial TR. Marland KitchenCompared to   study 08/30/2017, the inferolateral wall is now severely   hypokinetic and LVF appears to have declined some.  Impressions:  - mildly reduced LVF with EF 45-50% with basal inferior AK and   severe mid to apical inferolateral HK, mildly calcified anterior   MV leaflet with mild MR, moderately calcified AV annulus with   mild AVSC, mild LAE, trivial TR. Marland KitchenCompared  to study 08/30/2017,   the inferolateral wall is now severely hypokinetic and LVF   appears to have declined some.  Patient Profile     61 y.o. female with PMH of diastolic HF, IDDM, CKD, and HTN who presented on 08/29/17 with shortness of breath and hypoxia. Admitted with PNA/CHF.  Had a PEA arrest on 09/03/17, was intubated and transferred to the ICU.   According to common-law husband, only prior cardiac issue was hypertension.   Assessment & Plan    1.  PEA/bradycardia asystolic cardiac arrest, likely medication/respiratory related. 2.  Probable significant coronary artery disease with evidence of old inferior infarct on EKG and new development of inferoapical wall motion abnormality post resuscitation by echo.  Will need full definition of coronary anatomy when appropriate, if she recovers.  Not currently a candidate for invasive evaluation. 3.  Trivial troponin elevation likely related to demand ischemia not ACS. 4.  Acute on chronic combined systolic and diastolic heart failure.  Recommend diuresis as tolerated.  For questions or updates, please contact [place] Please consult [email or web address] for contact info under Cardiology/STEMI.      Signed, Sinclair Grooms, MD  09/05/2017, 11:42 AM

## 2017-09-05 NOTE — Plan of Care (Signed)
  Progressing Cardiac: Ability to achieve and maintain adequate cardiopulmonary perfusion will improve 09/05/2017 1342 - Progressing by Vickey Sages, RN Clinical Measurements: Ability to maintain clinical measurements within normal limits will improve 09/05/2017 1342 - Progressing by Vickey Sages, RN Will remain free from infection 09/05/2017 1342 - Progressing by Vickey Sages, RN Diagnostic test results will improve 09/05/2017 1342 - Progressing by Vickey Sages, RN Respiratory complications will improve 09/05/2017 1342 - Progressing by Vickey Sages, RN Cardiovascular complication will be avoided 09/05/2017 1342 - Progressing by Vickey Sages, RN Activity: Risk for activity intolerance will decrease 09/05/2017 1342 - Progressing by Vickey Sages, RN Nutrition: Adequate nutrition will be maintained 09/05/2017 1342 - Progressing by Vickey Sages, RN Coping: Level of anxiety will decrease 09/05/2017 1342 - Progressing by Vickey Sages, RN Skin Integrity: Risk for impaired skin integrity will decrease 09/05/2017 1342 - Progressing by Vickey Sages, RN Cardiac: Ability to achieve and maintain adequate cardiopulmonary perfusion will improve 09/05/2017 1342 - Progressing by Vickey Sages, RN Neurologic: Promote progressive neurologic recovery 09/05/2017 1342 - Progressing by Vickey Sages, RN Skin Integrity: Risk for impaired skin integrity will be minimized. 09/05/2017 1342 - Progressing by Vickey Sages, RN

## 2017-09-05 NOTE — Progress Notes (Signed)
150cc fentanyl drip wasted in sink followed by water flush by Joseph Berkshire RN and Lucius Conn, RN.

## 2017-09-05 NOTE — Progress Notes (Signed)
PULMONARY / CRITICAL CARE MEDICINE   Name: Brenda Sims MRN: [id number] DOB: 18-Nov-1955    ADMISSION DATE:  08/29/2017 CONSULTATION DATE:  09/03/2017  REFERRING MD:  [facility], Dron Tanna Furry MD  CHIEF COMPLAINT:  PEA arrest and respiratory distress  HISTORY OF PRESENT ILLNESS:   62 yoF with CHF, insulin dependent diabetes, CKD and HTN who presented to [facility] for dyspnea and hypoxia. She was admitted for acute on chronic congestive heart failure. At the time of admission she had attested to progressively worsening dyspnea with exhertion since moving to the area approximately one month prior. She was noted to be hypoxic in urgent care where she was sent to the ED. She reported subjective fever, chills, nausea, headache, dizziness, abdominal pain, dysuria, urgency, diarrhea and constipation.   She was treated for acute HFpEF, cardiopulmonary edema, with notable concern for RLL CAP. The patient was started on antibiotics, Flu/RSV negative, and lasix for diuresis. BNP was elevated to 1016 with CXR chest indicative of pulmonary edema with mild confluent opacity at the right base. CT chest on 1/20 demonstrated ground-glass opacities in the upper lobe with a mosaic pattern most consistent w/ pulmonary edema with reactive mediastinal lymphadenopathy. WBC count trending upward since admission, anemic with Hgb stable since admission.   Day 5 of admission: Patient noted to be unresponsive and pulseless with subsequent code blue initiated. ROSC was achieved after approximately 1 minute but the patient was noted have agonal breathing. She was subsequently intubated.   Approximately one hour prior to the code, the patient was noted to be extremely anxious, wheezing, with normal SpO2 sats. She was placed on a nonrebreather due to her concern of dyspnea, given lasix 40mg  IV, and ativan 1 mg IV. She coughed, rolled over. She was noted to be lying prone and pulses at that point.    SUBJECTIVE:  More awake, weaning Precedex  VITAL SIGNS: BP (!) 127/54   Pulse 66   Temp 98.1 F (36.7 C) (Esophageal)   Resp (!) 24   Ht 5\' 4"  (1.626 m)   Wt 169 lb 8.5 oz (76.9 kg)   SpO2 100%   BMI 29.10 kg/m   HEMODYNAMICS: CVP:  [3 mmHg-14 mmHg] 5 mmHg  VENTILATOR SETTINGS: Vent Mode: PRVC FiO2 (%):  [40 %] 40 % Set Rate:  [24 bmp] 24 bmp Vt Set:  [470 mL] 470 mL PEEP:  [5 cmH20-8 cmH20] 5 cmH20 Plateau Pressure:  [18 cmH20-25 cmH20] 20 cmH20  INTAKE / OUTPUT:  Intake/Output Summary (Last 24 hours) at 09/05/2017 1131 Last data filed at 09/05/2017 1100 Gross per 24 hour  Intake 3086.07 ml  Output 5100 ml  Net -2013.93 ml    PHYSICAL EXAMINATION: 62 year old female currently more awake on ventilator interactive, but not following commands. HEENT: Orally intubated, normocephalic atraumatic no JVD mucous membranes intact Pulmonary: Scattered rhonchi equal chest rise good tidal volume on pressure support of 12 Cardiac: Regular rate and rhythm without murmur rub or gallop Extremities: No significant edema brisk cap refill positive pulses. Abdomen: Soft nontender no organ tolerating tube feeds. GU: Clear yellow Neuro: Awake, moves all extremities, localizes but not following commands yet LABS:  BMET Recent Labs  Lab 09/03/17 2051 09/04/17 0403 09/05/17 0349  NA 143 143 148*  K 4.2 3.8 3.0*  CL 105 105 107  CO2 21* 23 24  BUN 46* 50* 76*  CREATININE 2.33* 2.31* 2.38*  GLUCOSE 134* 162* 156*   Electrolytes Recent Labs  Lab 09/03/17 2051 09/04/17  0403 09/04/17 1205 09/04/17 1556 09/05/17 0349  CALCIUM 8.4* 8.3*  --   --  8.4*  MG  --  2.3 2.4 2.5* 2.3  PHOS  --  8.1* 8.9* 8.5* 6.3*   CBC Recent Labs  Lab 09/03/17 0523 09/04/17 0403 09/05/17 0349  WBC 17.6* 16.1* 13.0*  HGB 7.4* 7.4* 8.0*  HCT 23.7* 23.0* 24.7*  PLT 317 328 387   Coag's Recent Labs  Lab 09/03/17 0523  APTT 41*  INR 1.30    Sepsis Markers Recent Labs   Lab 09/03/17 0523 09/04/17 0403 09/05/17 0349  LATICACIDVEN 0.8  --   --   PROCALCITON 0.74 1.14 0.82    ABG Recent Labs  Lab 09/03/17 2058 09/04/17 0213 09/04/17 0415  PHART 7.446 7.407 7.388  PCO2ART 34.8 39.0 42.8  PO2ART 153.0* 148.0* 175*    Liver Enzymes Recent Labs  Lab 09/03/17 0523  AST 58*  ALT 30  ALKPHOS 84  BILITOT 0.6  ALBUMIN 2.6*    Cardiac Enzymes Recent Labs  Lab 09/04/17 1556 09/04/17 2136 09/05/17 0349  TROPONINI 0.20* 0.19* 0.18*    Glucose Recent Labs  Lab 09/04/17 1147 09/04/17 1556 09/04/17 2029 09/04/17 2359 09/05/17 0355 09/05/17 0807  GLUCAP 130* 145* 147* 193* 170* 205*   Imaging Dg Chest Port 1 View  Addendum Date: 09/04/2017   ADDENDUM REPORT: 09/04/2017 21:59 ADDENDUM: Request made to comment on OG tube. As stated in the findings section of the report, the esophageal tube extends below the diaphragm but the tip is not included on the current image. Electronically Signed   By: Donavan Foil M.D.   On: 09/04/2017 21:59   Result Date: 09/04/2017 CLINICAL DATA:  History of ETT EXAM: PORTABLE CHEST 1 VIEW COMPARISON:  09/04/2017, 09/03/2017, 08/29/2017 FINDINGS: Endotracheal tube tip is about 3.7 cm superior to the carina. Esophageal tube tip is below the diaphragm but non included. Slightly improved aeration of the lung bases. Cardiomegaly with vascular congestion. Mild diffuse interstitial opacities suggesting residual edema. Probable small pleural effusions. Aortic atherosclerosis. Right-sided central venous catheter tip overlies the distal SVC. Negative for a pneumothorax. IMPRESSION: 1. Endotracheal tube tip about 3.7 cm superior to carina 2. Slightly improved aeration of the lung bases. 3. Cardiomegaly with small pleural effusions, mild vascular congestion and diffuse interstitial edema. Electronically Signed: By: Donavan Foil M.D. On: 09/04/2017 21:27   Dg Chest Port 1 View  Result Date: 09/04/2017 CLINICAL DATA:  central  line placement EXAM: PORTABLE CHEST - 1 VIEW COMPARISON:  Earlier film of the same day FINDINGS: Interval placement of right subclavian central venous catheter to the cavoatrial junction. No pneumothorax. Endotracheal tube and nasogastric tube stable in position. Diffuse bilateral edema or infiltrates may be marginally improved. Heart size upper limits normal for technique. Aortic Atherosclerosis (ICD10-170.0) Blunting of right costophrenic angle suggesting effusion. Visualized bones unremarkable. IMPRESSION: 1. Central line to cavoatrial junction without pneumothorax. 2. Slight if any improvement in bilateral edema or infiltrates. Electronically Signed   By: Lucrezia Europe M.D.   On: 09/04/2017 12:16   Dg Abd Portable 1v  Result Date: 09/04/2017 CLINICAL DATA:  Pulled on orogastric tube.  Assess positioning. EXAM: PORTABLE ABDOMEN - 1 VIEW COMPARISON:  Abdominal radiograph September 03, 2016 FINDINGS: Nasogastric tube side port projects in distal stomach, unchanged with side port past GE junction. Including bowel gas pattern is nondilated and nonobstructive. Aortoiliac calcifications. Cardiomegaly and interstitial prominence included chest. IMPRESSION: Similarly position nasogastric tube with distal tip projecting in mid stomach. Aortic  Atherosclerosis (ICD10-I70.0). Electronically Signed   By: Elon Alas M.D.   On: 09/04/2017 22:36    STUDIES:  1/17 Renal US >> atrophic right kidney but no hydro Mildly atrophic and echogenic right kidney. Clinical correlation is recommended. No hydronephrosis or shadowing stone; Trace left pleural effusion.  TTE 1/18 >> Dilated LV with moderate LVH, low normal LVF EF 50-55%, mitral calcified annulus withmildly Ca+ MV leaflets and mild MR, mild LAE, grade 2 DD, mild to moderate aortic annular calcification, trivial TR, dilated IVC. TTE 1/22: The cavity size was mildly dilated. There was mild concentric hypertrophy. Systolic function was mildly   reduced. The  estimated ejection fraction was in the range of 45%  to 50%. There is akinesis of the basal-midinferior myocardium.  There is severe hypokinesis of the mid-apicalinferolateral myocardium. 1/20 CT chest >> 1. Severe ground-glass opacities in upper lobe mosaic pattern most consistent with pulmonary edema. 2. Mediastinal lymphadenopathy is favored reactive.  EEG 1/22: severe diffuse cerebral dysfunction that is non-specific in etiology and can be seen in the setting of anoxic/ischemic injury, toxic/metabolic encephalopathies, or sedating medications  CULTURES: 1/20 RVP >> negative 1/20 MRSA PCR >>  ANTIBIOTICS: 1/17 Levaquin >> 1/20 1/20 Augmentin >>1/22 1/22 unasyn>>>  SIGNIFICANT EVENTS: 1/17  Admit 1/22  Cardiac arrest ~1 min  LINES/TUBES: PIV  1/22 ETT>>> 1/22 OGT>>> 1/22 Foley>>> 1/22 L radial a-line>>>  DISCUSSION: 57 yoF admitted with VDRF post PEA arrest. I think most likely pneumonia, followed by non-STEMI with acute on chronic decompensated heart failure.  Chest x-ray improving now on last day of antibiotics weaning sedation hopefully extubated in the next 24 hours  ASSESSMENT / PLAN:  PEA Arrest - brief 1 min CPR Acute on chronic systolic/diastolic heart failure w/ new NSTEMI Prolonged QTc Hx HTN, HLD Plan Add coreg and low dose norvasc Change lasix to 40/d (may need to stop this) Cont asa and liptior Try to d/c cardene after BB started Will eventually need ischemia eval   Acute hypoxic respiratory failure in setting of CAP complicated by acute pulmonary edema +/- element of ALI Tobacco abuse Hx mild asthma Pcxr: ETT good position improving bilateral airspace disease. Tolerating PSV Barrier to extubation at this point is delirium  Plan Lasix x1 Now on day 8 abx w/ no culture data (will complete abx today) Cont PSV w/ plan for SBT in am  RASS goal 0 Repeat CXR in am   Acute encephalopathy -->EEG w/ diffuse changes  Plan PAD protocol rass goal  0 Cont neurontin, celexa and xanax Dc hypothermia  AKI on CKD  Plan Renal dose meds Decreased lasix  Cont I&O Repeat am chem    Fluid and electrolyte imbalance: hypokalemia and hypernatremia  Plan Replace K  Add free water Repeat am chem   Anemia, ? IDA  Plan Anemia profile in am Transfuse per ICU protocoll   IDDM  Plan:   ssi   DVT prophylaxis:  heparin SUP: PPI  Diet: start tubefeeds Activity: BR Disposition : ICU  FAMILY  - Updates: No family at bedside to updated.  - Inter-disciplinary family meet or Palliative Care meeting due by:  09/11/2017  My cct Mer[place] ACNP-BC [place] Pager # [phone] OR # [phone] if no answer  Attending Note:  62 year old female presenting post PEA arrest and respiratory failure.  Patient is more interactive this AM but remains delirious with coarse BS on exam.  I reviewed CXR myself, ETT is in good position.  Will begin weaning trials as able but no extubation given mental status.  Continue free water.  Replace electrolytes.  BMET in AM.  Minimize sedation.  Continue support for now then once extubated will need to address code status.  The patient is critically ill with multiple organ systems failure and requires high complexity decision making for assessment and support, frequent evaluation and titration of therapies, application of advanced monitoring technologies and extensive interpretation of multiple databases.   Critical Care Time devoted to patient care services described in this note is  35  Minutes. This time reflects time of care of this signee Dr Jennet Maduro. This critical care time does not reflect procedure time, or teaching time or supervisory time of PA/NP/Med student/Med Resident etc but could involve care discussion time.  Rush Farmer, M.D. [facility]. Pager: [phone]. After hours pager: [phone].

## 2017-09-05 NOTE — Progress Notes (Signed)
CRITICAL VALUE ALERT  Critical Value:  Potassium- 2.7  Date & Time Notied:  09/05/17, 1702  Provider Notified: Dr. Nelda Marseille paged unsuccessfully; cardiology paged w/ orders  Orders Received/Actions taken: 34mEq KCl @ 1901

## 2017-09-05 NOTE — Progress Notes (Signed)
eLink Physician-Brief Progress Note Patient Name: Mairely Foxworth DOB: January 21, 1956 MRN: [id number]   Date of Service  09/05/2017  HPI/Events of Note  K+ 3.0 on furosemide gtt Cr 2.38  eICU Interventions  Replete K+ Hold furosemide gtt Recheck BMET @ 1400     Intervention Category Major Interventions: Other:  Wilhelmina Mcardle 09/05/2017, 6:45 AM

## 2017-09-06 ENCOUNTER — Inpatient Hospital Stay (HOSPITAL_COMMUNITY): Payer: Medicaid Other

## 2017-09-06 DIAGNOSIS — I5043 Acute on chronic combined systolic (congestive) and diastolic (congestive) heart failure: Secondary | ICD-10-CM

## 2017-09-06 DIAGNOSIS — I25119 Atherosclerotic heart disease of native coronary artery with unspecified angina pectoris: Secondary | ICD-10-CM

## 2017-09-06 DIAGNOSIS — Z720 Tobacco use: Secondary | ICD-10-CM

## 2017-09-06 DIAGNOSIS — R748 Abnormal levels of other serum enzymes: Secondary | ICD-10-CM

## 2017-09-06 LAB — BASIC METABOLIC PANEL
Anion gap: 14 (ref 5–15)
BUN: 69 mg/dL — AB (ref 6–20)
CHLORIDE: 110 mmol/L (ref 101–111)
CO2: 30 mmol/L (ref 22–32)
Calcium: 8.8 mg/dL — ABNORMAL LOW (ref 8.9–10.3)
Creatinine, Ser: 1.96 mg/dL — ABNORMAL HIGH (ref 0.44–1.00)
GFR calc Af Amer: 31 mL/min — ABNORMAL LOW (ref >60)
GFR, EST NON AFRICAN AMERICAN: 26 mL/min — AB (ref >60)
GLUCOSE: 173 mg/dL — AB (ref 65–99)
POTASSIUM: 3.1 mmol/L — AB (ref 3.5–5.1)
Sodium: 154 mmol/L — ABNORMAL HIGH (ref 135–145)

## 2017-09-06 LAB — CBC
HEMATOCRIT: 27 % — AB (ref 36.0–46.0)
HEMOGLOBIN: 8.2 g/dL — AB (ref 12.0–15.0)
MCH: 20.4 pg — AB (ref 26.0–34.0)
MCHC: 30.4 g/dL (ref 30.0–36.0)
MCV: 67.2 fL — AB (ref 78.0–100.0)
PLATELETS: 394 10*3/uL (ref 150–400)
RBC: 4.02 MIL/uL (ref 3.87–5.11)
RDW: 21.9 % — ABNORMAL HIGH (ref 11.5–15.5)
WBC: 11.6 10*3/uL — ABNORMAL HIGH (ref 4.0–10.5)

## 2017-09-06 LAB — GLUCOSE, CAPILLARY
GLUCOSE-CAPILLARY: 177 mg/dL — AB (ref 65–99)
GLUCOSE-CAPILLARY: 113 mg/dL — AB (ref 65–99)
GLUCOSE-CAPILLARY: 200 mg/dL — AB (ref 65–99)
GLUCOSE-CAPILLARY: 115 mg/dL — AB (ref 65–99)
Glucose-Capillary: 178 mg/dL — ABNORMAL HIGH (ref 65–99)
Glucose-Capillary: 262 mg/dL — ABNORMAL HIGH (ref 65–99)

## 2017-09-06 LAB — PROCALCITONIN
PROCALCITONIN: 0.4 ng/mL
Procalcitonin: 0.28 ng/mL

## 2017-09-06 LAB — TRIGLYCERIDES: Triglycerides: 234 mg/dL — ABNORMAL HIGH (ref <150)

## 2017-09-06 MED ORDER — POTASSIUM CHLORIDE 20 MEQ/15ML (10%) PO SOLN
40.0000 meq | Freq: Once | ORAL | Status: AC
  Administered 2017-09-06: 40 meq
  Filled 2017-09-06: qty 30

## 2017-09-06 MED ORDER — CLONIDINE HCL 0.1 MG PO TABS: 0.1000 mg | ORAL_TABLET | Freq: Three times a day (TID) | ORAL | Status: DC

## 2017-09-06 MED ORDER — CLONIDINE HCL 0.2 MG PO TABS
0.2000 mg | ORAL_TABLET | Freq: Three times a day (TID) | ORAL | Status: DC
  Administered 2017-09-06 – 2017-09-12 (×18): 0.2 mg via ORAL
  Filled 2017-09-06 (×18): qty 1

## 2017-09-06 MED ORDER — INSULIN ASPART 100 UNIT/ML ~~LOC~~ SOLN: 5.0000 [IU] | SUBCUTANEOUS | Status: DC

## 2017-09-06 MED ORDER — NITROGLYCERIN 0.2 MG/HR TD PT24
0.2000 mg | MEDICATED_PATCH | TRANSDERMAL | Status: DC
  Administered 2017-09-06 – 2017-09-12 (×8): 0.2 mg via TRANSDERMAL
  Filled 2017-09-06 (×8): qty 1

## 2017-09-06 MED ORDER — ACETAMINOPHEN 160 MG/5ML PO SOLN
650.0000 mg | ORAL | Status: DC | PRN
  Administered 2017-09-06: 650 mg

## 2017-09-06 MED ORDER — CLONIDINE HCL 0.2 MG/24HR TD PTWK: 0.2000 mg | MEDICATED_PATCH | TRANSDERMAL | Status: DC

## 2017-09-06 MED ORDER — GABAPENTIN 100 MG PO CAPS
100.0000 mg | ORAL_CAPSULE | Freq: Three times a day (TID) | ORAL | Status: DC
  Administered 2017-09-06 – 2017-09-12 (×17): 100 mg via ORAL
  Filled 2017-09-06 (×17): qty 1

## 2017-09-06 MED ORDER — DEXTROSE 5 % IV SOLN
INTRAVENOUS | Status: DC
  Administered 2017-09-06: 60 mL via INTRAVENOUS
  Administered 2017-09-07: 05:00:00 via INTRAVENOUS

## 2017-09-06 MED ORDER — INSULIN ASPART 100 UNIT/ML ~~LOC~~ SOLN
0.0000 [IU] | SUBCUTANEOUS | Status: DC
  Administered 2017-09-06: 8 [IU] via SUBCUTANEOUS
  Administered 2017-09-07: 3 [IU] via SUBCUTANEOUS
  Administered 2017-09-07: 2 [IU] via SUBCUTANEOUS
  Administered 2017-09-07 – 2017-09-08 (×5): 3 [IU] via SUBCUTANEOUS
  Administered 2017-09-08: 5 [IU] via SUBCUTANEOUS
  Administered 2017-09-09: 8 [IU] via SUBCUTANEOUS
  Administered 2017-09-09: 2 [IU] via SUBCUTANEOUS
  Administered 2017-09-09: 5 [IU] via SUBCUTANEOUS
  Administered 2017-09-10 (×3): 2 [IU] via SUBCUTANEOUS
  Administered 2017-09-10 – 2017-09-11 (×3): 3 [IU] via SUBCUTANEOUS
  Administered 2017-09-12: 2 [IU] via SUBCUTANEOUS
  Administered 2017-09-12: 3 [IU] via SUBCUTANEOUS

## 2017-09-06 MED ORDER — AMLODIPINE BESYLATE 5 MG PO TABS
5.0000 mg | ORAL_TABLET | Freq: Every day | ORAL | Status: DC
  Administered 2017-09-07 – 2017-09-12 (×6): 5 mg via ORAL
  Filled 2017-09-06 (×6): qty 1

## 2017-09-06 MED ORDER — AMLODIPINE BESYLATE 5 MG PO TABS
5.0000 mg | ORAL_TABLET | Freq: Every day | ORAL | Status: DC
  Administered 2017-09-06: 5 mg
  Filled 2017-09-06: qty 1

## 2017-09-06 MED ORDER — HYDRALAZINE HCL 50 MG PO TABS
100.0000 mg | ORAL_TABLET | Freq: Three times a day (TID) | ORAL | Status: DC
  Administered 2017-09-06 – 2017-09-12 (×17): 100 mg via ORAL
  Filled 2017-09-06 (×16): qty 2

## 2017-09-06 MED ORDER — MONTELUKAST SODIUM 10 MG PO TABS
10.0000 mg | ORAL_TABLET | Freq: Every day | ORAL | Status: DC
  Administered 2017-09-06 – 2017-09-11 (×5): 10 mg via ORAL
  Filled 2017-09-06 (×4): qty 1

## 2017-09-06 NOTE — Progress Notes (Signed)
[facility] Progress Note Patient Name: Brenda Sims DOB: [birth year] MRN: [id number]   Date of Service  09/06/2017  HPI/Events of Note  Low potassium  eICU Interventions  replaced     Intervention Category Minor Interventions: Electrolytes abnormality - evaluation and management  Mauri [place], P 09/06/2017, 6:05 AM

## 2017-09-06 NOTE — Progress Notes (Signed)
Progress Note  Patient Name: Brenda Sims Date of Encounter: 09/06/2017  Primary Cardiologist: Linard Millers, New  Subjective   Following commands.  Still intubated.  Denies chest pain.   Inpatient Medications    Scheduled Meds: . amLODipine  5 mg Per Tube Daily  . aspirin  81 mg Oral Daily  . atorvastatin  40 mg Oral q1800  . budesonide (PULMICORT) nebulizer solution  0.25 mg Nebulization BID  . carvedilol  6.25 mg Oral BID WC  . chlorhexidine gluconate (MEDLINE KIT)  15 mL Mouth Rinse BID  . Chlorhexidine Gluconate Cloth  6 each Topical Daily  . feeding supplement (PRO-STAT SUGAR FREE 64)  30 mL Per Tube BID  . feeding supplement (VITAL HIGH PROTEIN)  1,000 mL Per Tube Q24H  . free water  200 mL Per Tube Q6H  . furosemide  40 mg Intravenous Daily  . gabapentin  100 mg Per Tube TID  . heparin injection (subcutaneous)  5,000 Units Subcutaneous Q8H  . hydrALAZINE  100 mg Per Tube Q8H  . insulin aspart  2-6 Units Subcutaneous Q4H  . ipratropium-albuterol  3 mL Nebulization Q6H  . loratadine  10 mg Oral Daily  . mouth rinse  15 mL Mouth Rinse 10 times per day  . montelukast  10 mg Per Tube QHS  . pantoprazole sodium  40 mg Per Tube Q1200  . sodium chloride flush  10-40 mL Intracatheter Q12H   Continuous Infusions: . sodium chloride Stopped (09/04/17 1741)  . dexmedetomidine (PRECEDEX) IV infusion 0.1 mcg/kg/hr (09/06/17 0706)   PRN Meds: acetaminophen (TYLENOL) oral liquid 160 mg/5 mL, albuterol, bisacodyl, docusate, fentaNYL (SUBLIMAZE) injection, fentaNYL (SUBLIMAZE) injection, hydrALAZINE, sodium chloride flush   Vital Signs    Vitals:   09/06/17 0417 09/06/17 0500 09/06/17 0600 09/06/17 0700  BP:  (!) 169/76 (!) 171/73 (!) 175/77  Pulse: 76 77 84 94  Resp: '19 20 17 ' (!) 22  Temp:    (!) 101.3 F (38.5 C)  TempSrc:    Oral  SpO2: 99% 96% 98% 97%  Weight:      Height:        Intake/Output Summary (Last 24 hours) at 09/06/2017 0851 Last data filed at 09/06/2017  0700 Gross per 24 hour  Intake 2450.15 ml  Output 3875 ml  Net -1424.85 ml   Filed Weights   09/04/17 0206 09/05/17 0203 09/06/17 0300  Weight: 178 lb 2.1 oz (80.8 kg) 169 lb 8.5 oz (76.9 kg) 175 lb 0.7 oz (79.4 kg)    Telemetry    Sinus rhythm/sinus tachycardia- Personally Reviewed  ECG    Sinus rhythm, left anterior hemiblock, poor R wave progression, prominent voltage.- Personally Reviewed  Physical Exam  Obese African-American female, intubated GEN:  Eyes open. Neck:  Unable to evaluate JVD Cardiac: RRR, no murmurs, rubs, or gallops.  Respiratory:  Course upper airway sounds and support apparatus noises. GI: Soft, nontender, non-distended  MS: No edema Neuro:   Awake, follows simple commands Psych: Unable to assess  Labs    Chemistry Recent Labs  Lab 09/03/17 0523  09/05/17 0349 09/05/17 1601 09/06/17 0440  NA 141   < > 148* 152* 154*  K 6.5*   < > 3.0* 2.7* 3.1*  CL 107   < > 107 108 110  CO2 23   < > '24 30 30  ' GLUCOSE 156*   < > 156* 131* 173*  BUN 37*   < > 76* 62* 69*  CREATININE 2.07*   < >  2.38* 2.06* 1.96*  CALCIUM 8.5*   < > 8.4* 8.3* 8.8*  PROT 6.4*  --   --   --   --   ALBUMIN 2.6*  --   --   --   --   AST 58*  --   --   --   --   ALT 30  --   --   --   --   ALKPHOS 84  --   --   --   --   BILITOT 0.6  --   --   --   --   GFRNONAA 25*   < > 21* 25* 26*  GFRAA 29*   < > 24* 29* 31*  ANIONGAP 11   < > 17* 14 14   < > = values in this interval not displayed.     Hematology Recent Labs  Lab 09/04/17 0403 09/05/17 0349 09/05/17 1216 09/06/17 0440  WBC 16.1* 13.0*  --  11.6*  RBC 3.53* 3.79* 3.91 4.02  HGB 7.4* 8.0*  --  8.2*  HCT 23.0* 24.7*  --  27.0*  MCV 65.2* 65.2*  --  67.2*  MCH 21.0* 21.1*  --  20.4*  MCHC 32.2 32.4  --  30.4  RDW 22.0* 22.1*  --  21.9*  PLT 328 387  --  394    Cardiac Enzymes Recent Labs  Lab 09/04/17 0642 09/04/17 1556 09/04/17 2136 09/05/17 0349  TROPONINI 0.14* 0.20* 0.19* 0.18*    No results  for input(s): TROPIPOC in the last 168 hours.   BNP No results for input(s): BNP, PROBNP in the last 168 hours.   DDimer No results for input(s): DDIMER in the last 168 hours.   Radiology    Dg Chest Port 1 View  Result Date: 09/06/2017 CLINICAL DATA:  Pneumonia. EXAM: PORTABLE CHEST 1 VIEW COMPARISON:  09/04/2017. FINDINGS: Endotracheal tube, NG tube, right subclavian line in stable position. Cardiomegaly with pulmonary venous congestion bilateral interstitial prominence and small bilateral pleural effusions noted. Findings consistent CHF. Findings have slightly worsened from prior exam. Low lung volumes with basilar atelectasis. No pneumothorax. IMPRESSION: Lines and tubes in stable position cardiomegaly with bilateral from interstitial prominence of small pleural effusions consistent with CHF. Findings have slightly worsened from prior exam. Low lung volumes with basilar atelectasis. Electronically Signed   By: Marcello Moores  Register   On: 09/06/2017 08:38   Dg Chest Port 1 View  Addendum Date: 09/04/2017   ADDENDUM REPORT: 09/04/2017 21:59 ADDENDUM: Request made to comment on OG tube. As stated in the findings section of the report, the esophageal tube extends below the diaphragm but the tip is not included on the current image. Electronically Signed   By: Donavan Foil M.D.   On: 09/04/2017 21:59   Result Date: 09/04/2017 CLINICAL DATA:  History of ETT EXAM: PORTABLE CHEST 1 VIEW COMPARISON:  09/04/2017, 09/03/2017, 08/29/2017 FINDINGS: Endotracheal tube tip is about 3.7 cm superior to the carina. Esophageal tube tip is below the diaphragm but non included. Slightly improved aeration of the lung bases. Cardiomegaly with vascular congestion. Mild diffuse interstitial opacities suggesting residual edema. Probable small pleural effusions. Aortic atherosclerosis. Right-sided central venous catheter tip overlies the distal SVC. Negative for a pneumothorax. IMPRESSION: 1. Endotracheal tube tip about 3.7 cm  superior to carina 2. Slightly improved aeration of the lung bases. 3. Cardiomegaly with small pleural effusions, mild vascular congestion and diffuse interstitial edema. Electronically Signed: By: Donavan Foil M.D. On: 09/04/2017 21:27  Dg Chest Port 1 View  Result Date: 09/04/2017 CLINICAL DATA:  central line placement EXAM: PORTABLE CHEST - 1 VIEW COMPARISON:  Earlier film of the same day FINDINGS: Interval placement of right subclavian central venous catheter to the cavoatrial junction. No pneumothorax. Endotracheal tube and nasogastric tube stable in position. Diffuse bilateral edema or infiltrates may be marginally improved. Heart size upper limits normal for technique. Aortic Atherosclerosis (ICD10-170.0) Blunting of right costophrenic angle suggesting effusion. Visualized bones unremarkable. IMPRESSION: 1. Central line to cavoatrial junction without pneumothorax. 2. Slight if any improvement in bilateral edema or infiltrates. Electronically Signed   By: Lucrezia Europe M.D.   On: 09/04/2017 12:16   Dg Abd Portable 1v  Result Date: 09/04/2017 CLINICAL DATA:  Pulled on orogastric tube.  Assess positioning. EXAM: PORTABLE ABDOMEN - 1 VIEW COMPARISON:  Abdominal radiograph September 03, 2016 FINDINGS: Nasogastric tube side port projects in distal stomach, unchanged with side port past GE junction. Including bowel gas pattern is nondilated and nonobstructive. Aortoiliac calcifications. Cardiomegaly and interstitial prominence included chest. IMPRESSION: Similarly position nasogastric tube with distal tip projecting in mid stomach. Aortic Atherosclerosis (ICD10-I70.0). Electronically Signed   By: Elon Alas M.D.   On: 09/04/2017 22:36    Cardiac Studies   Echocardiography, 09/03/2016: Study Conclusions  - Left ventricle: The cavity size was mildly dilated. There was   mild concentric hypertrophy. Systolic function was mildly   reduced. The estimated ejection fraction was in the range of 45%    to 50%. There is akinesis of the basal-midinferior myocardium.   There is severe hypokinesis of the mid-apicalinferolateral   myocardium. - Aortic valve: Mildly to moderately calcified annulus. Trileaflet;   mildly thickened, mildly calcified leaflets. - Mitral valve: Calcified annulus. Mild focal calcification of the   anterior leaflet. There was trivial regurgitation. - Left atrium: The atrium was mildly dilated. - Tricuspid valve: There was trivial regurgitation. - Impressions: mildly reduced LVF with EF 45-50% with basal   inferior AK and severe mid to apical inferolateral HK, mildly   calcified anterior MV leaflet with mild MR, moderately calcified   AV annulus with mild AVSC, mild LAE, trivial TR. Marland KitchenCompared to   study 08/30/2017, the inferolateral wall is now severely   hypokinetic and LVF appears to have declined some.  Impressions:  - mildly reduced LVF with EF 45-50% with basal inferior AK and   severe mid to apical inferolateral HK, mildly calcified anterior   MV leaflet with mild MR, moderately calcified AV annulus with   mild AVSC, mild LAE, trivial TR. Marland KitchenCompared to study 08/30/2017,   the inferolateral wall is now severely hypokinetic and LVF   appears to have declined some.  Patient Profile     62 y.o. female with PMH of diastolic HF, IDDM, CKD, and HTN who presented on 08/29/17 with shortness of breath and hypoxia. Admitted with PNA/CHF. Had a PEA arrest on 09/03/17, was intubated and transferred to the ICU.   According to common-law husband, only prior cardiac issue was hypertension.   Assessment & Plan    1.  PEA/bradycardia asystolic cardiac arrest, with evidence of dramatic improvement this morning.  Follows commands.  Etiology of arrest is uncertain but not felt to be primarily ischemic.  More likely respiratory. 2.  Probable significant coronary artery disease EKG today looks like left anterior hemiblock and not old inferior infarct as I previously thought.  I  still have high suspicion that she has significant coronary disease.  We will ultimately perform coronary  angiography sometime next week when she is clinically stable and extubated. 3.  Trivial troponin no evidence of ACS. 4.  Acute on chronic combined systolic and diastolic heart failure.  Diuresis as tolerated. 5.  Elevated blood pressure, add long-acting nitrates and clonidine patch.  For questions or updates, please contact [place] Please consult [email or web address] for contact info under Cardiology/STEMI.      Signed, Sinclair Grooms, MD  09/06/2017, 8:51 AM

## 2017-09-06 NOTE — Procedures (Signed)
Extubation Procedure Note  Patient Details:   Name: Maisley Hainsworth DOB: 1955/09/13 MRN: [id number]   Airway Documentation:     Evaluation  O2 sats: stable throughout Complications: No apparent complications Patient did tolerate procedure well. Bilateral Breath Sounds: Diminished   Yes   Patient extubated to [place]. Vital signs stable throughout. Patient tolerating well at this time. RT will continue to monitor.  Mcneil Sober 09/06/2017, 10:46 AM

## 2017-09-06 NOTE — Progress Notes (Signed)
PULMONARY / CRITICAL CARE MEDICINE   Name: Brenda Sims MRN: [id number] DOB: 04-06-1956    ADMISSION DATE:  08/29/2017 CONSULTATION DATE:  09/03/2017  REFERRING MD:  [facility], Dron Tanna Furry MD  CHIEF COMPLAINT:  PEA arrest and respiratory distress  HISTORY OF PRESENT ILLNESS:   62 yoF with CHF, insulin dependent diabetes, CKD and HTN who presented to [facility] for dyspnea and hypoxia. She was admitted for acute on chronic congestive heart failure. At the time of admission she had attested to progressively worsening dyspnea with exhertion since moving to the area approximately one month prior. She was noted to be hypoxic in urgent care where she was sent to the ED. She reported subjective fever, chills, nausea, headache, dizziness, abdominal pain, dysuria, urgency, diarrhea and constipation.   She was treated for acute HFpEF, cardiopulmonary edema, with notable concern for RLL CAP. The patient was started on antibiotics, Flu/RSV negative, and lasix for diuresis. BNP was elevated to 1016 with CXR chest indicative of pulmonary edema with mild confluent opacity at the right base. CT chest on 1/20 demonstrated ground-glass opacities in the upper lobe with a mosaic pattern most consistent w/ pulmonary edema with reactive mediastinal lymphadenopathy. WBC count trending upward since admission, anemic with Hgb stable since admission.   Day 5 of admission: Patient noted to be unresponsive and pulseless with subsequent code blue initiated. ROSC was achieved after approximately 1 minute but the patient was noted have agonal breathing. She was subsequently intubated.   Approximately one hour prior to the code, the patient was noted to be extremely anxious, wheezing, with normal SpO2 sats. She was placed on a nonrebreather due to her concern of dyspnea, given lasix 40mg  IV, and ativan 1 mg IV. She coughed, rolled over. She was noted to be lying prone and pulses at that point.    SUBJECTIVE:  More awake today  VITAL SIGNS: BP (!) 175/77   Pulse 94   Temp (!) 101.3 F (38.5 C) (Oral)   Resp (!) 22   Ht 5\' 4"  (1.626 m)   Wt 175 lb 0.7 oz (79.4 kg)   SpO2 97%   BMI 30.05 kg/m   HEMODYNAMICS: CVP:  [3 mmHg-40 mmHg] 3 mmHg  VENTILATOR SETTINGS: Vent Mode: PRVC FiO2 (%):  [40 %] 40 % Set Rate:  [16 bmp] 16 bmp Vt Set:  [440 mL] 440 mL PEEP:  [5 cmH20] 5 cmH20 Pressure Support:  [10 cmH20-12 cmH20] 10 cmH20 Plateau Pressure:  [16 cmH20-19 cmH20] 18 cmH20  INTAKE / OUTPUT:  Intake/Output Summary (Last 24 hours) at 09/06/2017 7893 Last data filed at 09/06/2017 0700 Gross per 24 hour  Intake 2352.1 ml  Output 2900 ml  Net -547.9 ml    PHYSICAL EXAMINATION: General: This is 62 year old female currently resting comfortably on the vent. HEENT: Normocephalic atraumatic no jugular venous distention mucous membrane moist orally intubated Pulmonary: Scattered rhonchi equal chest rise no accessory use Cardiac: Regular rate and rhythm Abdomen: Soft nontender Extremities: Brisk cap refill warm, strong pulses no significant edema Neuro: More interactive, intermittently following commands  lABS:  BMET Recent Labs  Lab 09/05/17 0349 09/05/17 1601 09/06/17 0440  NA 148* 152* 154*  K 3.0* 2.7* 3.1*  CL 107 108 110  CO2 24 30 30   BUN 76* 62* 69*  CREATININE 2.38* 2.06* 1.96*  GLUCOSE 156* 131* 173*   Electrolytes Recent Labs  Lab 09/04/17 1556 09/05/17 0349 09/05/17 1601 09/06/17 0440  CALCIUM  --  8.4* 8.3* 8.8*  MG 2.5* 2.3 2.3  --   PHOS 8.5* 6.3* 6.6*  --    CBC Recent Labs  Lab 09/04/17 0403 09/05/17 0349 09/06/17 0440  WBC 16.1* 13.0* 11.6*  HGB 7.4* 8.0* 8.2*  HCT 23.0* 24.7* 27.0*  PLT 328 387 394   Coag's Recent Labs  Lab 09/03/17 0523  APTT 41*  INR 1.30    Sepsis Markers Recent Labs  Lab 09/03/17 0523 09/04/17 0403 09/05/17 0349  LATICACIDVEN 0.8  --   --   PROCALCITON 0.74 1.14 0.82    ABG Recent Labs   Lab 09/03/17 2058 09/04/17 0213 09/04/17 0415  PHART 7.446 7.407 7.388  PCO2ART 34.8 39.0 42.8  PO2ART 153.0* 148.0* 175*    Liver Enzymes Recent Labs  Lab 09/03/17 0523  AST 58*  ALT 30  ALKPHOS 84  BILITOT 0.6  ALBUMIN 2.6*    Cardiac Enzymes Recent Labs  Lab 09/04/17 1556 09/04/17 2136 09/05/17 0349  TROPONINI 0.20* 0.19* 0.18*    Glucose Recent Labs  Lab 09/05/17 1138 09/05/17 1524 09/05/17 2012 09/05/17 2346 09/06/17 0415 09/06/17 0752  GLUCAP 157* 119* 166* 177* 178* 177*   Imaging Dg Chest Port 1 View  Result Date: 09/06/2017 CLINICAL DATA:  Pneumonia. EXAM: PORTABLE CHEST 1 VIEW COMPARISON:  09/04/2017. FINDINGS: Endotracheal tube, NG tube, right subclavian line in stable position. Cardiomegaly with pulmonary venous congestion bilateral interstitial prominence and small bilateral pleural effusions noted. Findings consistent CHF. Findings have slightly worsened from prior exam. Low lung volumes with basilar atelectasis. No pneumothorax. IMPRESSION: Lines and tubes in stable position cardiomegaly with bilateral from interstitial prominence of small pleural effusions consistent with CHF. Findings have slightly worsened from prior exam. Low lung volumes with basilar atelectasis. Electronically Signed   By: Marcello Moores  Register   On: 09/06/2017 08:38    STUDIES:  1/17 Renal US >> atrophic right kidney but no hydro Mildly atrophic and echogenic right kidney. Clinical correlation is recommended. No hydronephrosis or shadowing stone; Trace left pleural effusion.  TTE 1/18 >> Dilated LV with moderate LVH, low normal LVF EF 50-55%, mitral calcified annulus withmildly Ca+ MV leaflets and mild MR, mild LAE, grade 2 DD, mild to moderate aortic annular calcification, trivial TR, dilated IVC. TTE 1/22: The cavity size was mildly dilated. There was mild concentric hypertrophy. Systolic function was mildly   reduced. The estimated ejection fraction was in the range of  45%  to 50%. There is akinesis of the basal-midinferior myocardium.  There is severe hypokinesis of the mid-apicalinferolateral myocardium. 1/20 CT chest >> 1. Severe ground-glass opacities in upper lobe mosaic pattern most consistent with pulmonary edema. 2. Mediastinal lymphadenopathy is favored reactive.  EEG 1/22: severe diffuse cerebral dysfunction that is non-specific in etiology and can be seen in the setting of anoxic/ischemic injury, toxic/metabolic encephalopathies, or sedating medications  CULTURES: 1/20 RVP >> negative 1/20 MRSA PCR >>  ANTIBIOTICS: 1/17 Levaquin >> 1/20 1/20 Augmentin >>1/22 1/22 unasyn>>>1/25  SIGNIFICANT EVENTS: 1/17  Admit 1/22  Cardiac arrest ~1 min  LINES/TUBES: PIV  1/22 ETT>>> 1/22 OGT>>> 1/22 Foley>>> 1/22 L radial a-line>>>1/24  DISCUSSION: 47 yoF admitted with VDRF post PEA arrest. I think most likely pneumonia, followed by non-STEMI with acute on chronic decompensated heart failure.   More awake, hemodynamically stable, working on hypertension management.  Major issue for today will be to assess for readiness for extubation.  She is more atelectatic and does have low-grade fever but this does not necessarily mean she cannot come off the vent.  We will see how she looks, my hope is she will be extubated the next 24-48 hours  ASSESSMENT / PLAN:  PEA Arrest - brief 1 min CPR Acute on chronic systolic/diastolic heart failure w/ new NSTEMI Prolonged QTc Hx HTN, HLD Plan Continue carvedilol  Repeat Lasix Continue aspirin and Lipitor Clonidine 0.2 mg tid  Acute hypoxic respiratory failure in setting of CAP complicated by acute pulmonary edema +/- element of ALI Tobacco abuse Hx mild asthma Pcxr: Endotracheal tube in satisfactory position.  Diffuse bilateral airspace disease.  Aeration looks a little worse today Barrier as of 1/24 was mental status, this looks to be improved.  Hoping will be near extubation either on 1/25 or  1/26 Plan Repeat Lasix Daily spontaneous breathing trials; with assessment for extubation versus ongoing pressure support RASS goal 0 Repeat chest x-ray a.m.  Acute encephalopathy -->EEG w/ diffuse changes; continues to improve Plan Continue Neurontin and Celexa and Xanax RASS goal 0 Precedex infusion   New fever 1/25. No spike in leukocytosis. Did stop abx on 1/24 Plan PCT algo Repeat sputum  AKI on CKD  Renal function improved some with reduced diuretic dosing Unclear what her baseline is Plan Continue Lasix at 40 mg daily as tolerated Renal dose medications Follow-up a.m. chemistry  Fluid and electrolyte imbalance: hypokalemia and hypernatremia  Plan Free water replacement Replace potassium Continuing Lasix at current dose  Chronic anemia: No evidence of bleeding Plan Transfuse per protocol Intermittent CBC Continue subcu heparin  IDDM  Plan:   Sliding scale insulin  DVT prophylaxis: [place] heparin SUP: PPI  Diet: start tubefeeds Activity: BR Disposition : ICU  FAMILY  - Updates: No family at bedside to updated.  - Inter-disciplinary family meet or Palliative Care meeting due by:  09/11/2017  DVT prophylaxis: [place] heparin  SUP: stopped Diet: advance Activity: advance Disposition : ICU  My cct [place] ACNP-BC [place] Pager # [phone] OR # [phone] if no answer   Attending Note:  62 year old female with PNA and cardiac arrest that was in septic shock that is now resolving.  Patient is weaning well this AM on exam and tolerating PS very well.  I reviewed CXR myself, ETT is in good position.  Discussed with PCCM-NP.  Will proceed with extubation today, SLP, IS, OOB, PT evaluation, titrate O2 down as able for sat of 88-92%.  Hold in the ICU post extubation given concern for delirium.  PCCM will continue to follow.  The patient is critically ill with multiple organ systems failure and requires high complexity decision  making for assessment and support, frequent evaluation and titration of therapies, application of advanced monitoring technologies and extensive interpretation of multiple databases.   Critical Care Time devoted to patient care services described in this note is  35  Minutes. This time reflects time of care of this signee Dr Jennet Maduro. This critical care time does not reflect procedure time, or teaching time or supervisory time of PA/NP/Med student/Med Resident etc but could involve care discussion time.  Rush Farmer, M.D. [facility] Pulmonary/Critical Care Medicine. Pager: [phone]. After hours pager: [phone].

## 2017-09-07 LAB — COMPREHENSIVE METABOLIC PANEL
ALK PHOS: 64 U/L (ref 38–126)
ALT: 21 U/L (ref 14–54)
ANION GAP: 12 (ref 5–15)
AST: 30 U/L (ref 15–41)
Albumin: 2.6 g/dL — ABNORMAL LOW (ref 3.5–5.0)
BUN: 47 mg/dL — ABNORMAL HIGH (ref 6–20)
CALCIUM: 8.8 mg/dL — AB (ref 8.9–10.3)
CHLORIDE: 99 mmol/L — AB (ref 101–111)
CO2: 31 mmol/L (ref 22–32)
CREATININE: 1.56 mg/dL — AB (ref 0.44–1.00)
GFR, EST AFRICAN AMERICAN: 40 mL/min — AB (ref >60)
GFR, EST NON AFRICAN AMERICAN: 35 mL/min — AB (ref >60)
Glucose, Bld: 149 mg/dL — ABNORMAL HIGH (ref 65–99)
Potassium: 2.8 mmol/L — ABNORMAL LOW (ref 3.5–5.1)
SODIUM: 142 mmol/L (ref 135–145)
Total Bilirubin: 0.8 mg/dL (ref 0.3–1.2)
Total Protein: 6.4 g/dL — ABNORMAL LOW (ref 6.5–8.1)

## 2017-09-07 LAB — GLUCOSE, CAPILLARY
GLUCOSE-CAPILLARY: 153 mg/dL — AB (ref 65–99)
GLUCOSE-CAPILLARY: 168 mg/dL — AB (ref 65–99)
GLUCOSE-CAPILLARY: 149 mg/dL — AB (ref 65–99)
GLUCOSE-CAPILLARY: 132 mg/dL — AB (ref 65–99)
Glucose-Capillary: 165 mg/dL — ABNORMAL HIGH (ref 65–99)
Glucose-Capillary: 198 mg/dL — ABNORMAL HIGH (ref 65–99)

## 2017-09-07 LAB — CBC
HCT: 25.6 % — ABNORMAL LOW (ref 36.0–46.0)
Hemoglobin: 7.8 g/dL — ABNORMAL LOW (ref 12.0–15.0)
MCH: 20.5 pg — AB (ref 26.0–34.0)
MCHC: 30.5 g/dL (ref 30.0–36.0)
MCV: 67.2 fL — AB (ref 78.0–100.0)
PLATELETS: 364 10*3/uL (ref 150–400)
RBC: 3.81 MIL/uL — ABNORMAL LOW (ref 3.87–5.11)
RDW: 21.7 % — ABNORMAL HIGH (ref 11.5–15.5)
WBC: 17.1 10*3/uL — ABNORMAL HIGH (ref 4.0–10.5)

## 2017-09-07 MED ORDER — POTASSIUM CHLORIDE 20 MEQ/15ML (10%) PO SOLN
40.0000 meq | Freq: Once | ORAL | Status: AC
  Administered 2017-09-07: 40 meq via ORAL

## 2017-09-07 MED ORDER — TEMAZEPAM 15 MG PO CAPS: 15.0000 mg | ORAL_CAPSULE | Freq: Every evening | ORAL | Status: DC | PRN

## 2017-09-07 MED ORDER — POTASSIUM CHLORIDE 20 MEQ/15ML (10%) PO SOLN
ORAL | Status: AC
  Filled 2017-09-07: qty 30

## 2017-09-07 MED ORDER — IPRATROPIUM-ALBUTEROL 0.5-2.5 (3) MG/3ML IN SOLN
3.0000 mL | Freq: Two times a day (BID) | RESPIRATORY_TRACT | Status: DC
  Administered 2017-09-07 – 2017-09-11 (×9): 3 mL via RESPIRATORY_TRACT
  Filled 2017-09-07 (×10): qty 3

## 2017-09-07 MED ORDER — POTASSIUM CHLORIDE 20 MEQ/15ML (10%) PO SOLN
40.0000 meq | Freq: Once | ORAL | Status: AC
  Administered 2017-09-07: 40 meq via ORAL
  Filled 2017-09-07: qty 30

## 2017-09-07 MED ORDER — ZOLPIDEM TARTRATE 5 MG PO TABS: 5.0000 mg | ORAL_TABLET | Freq: Once | ORAL | Status: DC

## 2017-09-07 NOTE — Progress Notes (Signed)
Rehab Admissions Coordinator Note:  Patient was screened by Retta Diones for appropriateness for an Inpatient Acute Rehab Consult.  At this time, we are recommending Inpatient Rehab consult.  Retta Diones 09/07/2017, 3:34 PM  I can be reached at [phone].

## 2017-09-07 NOTE — Progress Notes (Addendum)
PULMONARY / CRITICAL CARE MEDICINE   Name: Brenda Sims MRN: [id number] DOB: [birth year]    ADMISSION DATE:  08/29/2017 CONSULTATION DATE:  09/03/2017  REFERRING MD:  [facility], Dron Tanna Furry MD  CHIEF COMPLAINT:  PEA arrest and respiratory distress  HISTORY OF PRESENT ILLNESS:   41 yoF with CHF, insulin dependent diabetes, CKD and HTN who presented to [facility] for dyspnea and hypoxia. She was admitted for acute on chronic congestive heart failure. At the time of admission she had attested to progressively worsening dyspnea with exhertion since moving to the area approximately one month prior. She was noted to be hypoxic in urgent care where she was sent to the ED.    She was treated for acute HFpEF, cardiopulmonary edema, with notable concern for RLL CAP. The patient was started on antibiotics, Flu/RSV negative, and lasix for diuresis. BNP was elevated to 1016 with CXR chest indicative of pulmonary edema with mild confluent opacity at the right base. CT chest on 1/20 demonstrated ground-glass opacities in the upper lobe with a mosaic pattern most consistent w/ pulmonary edema with reactive mediastinal lymphadenopathy. WBC count trending upward since admission, anemic with Hgb stable since admission.   Day 5 of admission: Patient noted to be unresponsive and pulseless with subsequent code blue initiated. ROSC was achieved after approximately 1 minute but the patient was noted have agonal breathing. She was subsequently intubated.   CULTURES: 1/20 RVP >> negative 1/20 MRSA PCR >>neg  ANTIBIOTICS: 1/17 Levaquin >> 1/20 1/20 Augmentin >>1/22 1/22 unasyn>>>1/25  SIGNIFICANT EVENTS: 1/17  Admit 1/22  Cardiac arrest ~1 min  LINES/TUBES: 1/22 ETT>>>1/25 1/22 Foley>>> 1/22 L radial a-line>>>1/24   SUBJECTIVE:  Denies CP, dyspnea Feels 'much better '  VITAL SIGNS: BP (!) 151/83   Pulse 88   Temp 98.6 F (37 C) (Oral)   Resp (!) 21   Ht 5\' 4"  (1.626 m)    Wt 175 lb 0.7 oz (79.4 kg)   SpO2 91%   BMI 30.05 kg/m   HEMODYNAMICS: CVP:  [4 mmHg-21 mmHg] 12 mmHg  VENTILATOR SETTINGS:    INTAKE / OUTPUT:  Intake/Output Summary (Last 24 hours) at 09/07/2017 1141 Last data filed at 09/07/2017 1000 Gross per 24 hour  Intake 1373 ml  Output 3075 ml  Net -1702 ml    PHYSICAL EXAMINATION: General: no distress. HEENT:no JVD, pallor or icterus mucous membrane moist  Pulmonary: no rhonchi equal chest rise no accessory use Cardiac: Regular rate and rhythm Abdomen: Soft nontender Extremities: Brisk cap refill warm, strong pulses no significant edema Neuro: alert, interactive,  lABS:  BMET Recent Labs  Lab 09/05/17 1601 09/06/17 0440 09/07/17 0410  NA 152* 154* 142  K 2.7* 3.1* 2.8*  CL 108 110 99*  CO2 30 30 31   BUN 62* 69* 47*  CREATININE 2.06* 1.96* 1.56*  GLUCOSE 131* 173* 149*   Electrolytes Recent Labs  Lab 09/04/17 1556 09/05/17 0349 09/05/17 1601 09/06/17 0440 09/07/17 0410  CALCIUM  --  8.4* 8.3* 8.8* 8.8*  MG 2.5* 2.3 2.3  --   --   PHOS 8.5* 6.3* 6.6*  --   --    CBC Recent Labs  Lab 09/05/17 0349 09/06/17 0440 09/07/17 0410  WBC 13.0* 11.6* 17.1*  HGB 8.0* 8.2* 7.8*  HCT 24.7* 27.0* 25.6*  PLT 387 394 364   Coag's Recent Labs  Lab 09/03/17 0523  APTT 41*  INR 1.30    Sepsis Markers Recent Labs  Lab 09/03/17 0523  09/05/17 0349 09/06/17 0442 09/06/17 1448  LATICACIDVEN 0.8  --   --   --   --   PROCALCITON 0.74   < > 0.82 0.40 0.28   < > = values in this interval not displayed.    ABG Recent Labs  Lab 09/03/17 2058 09/04/17 0213 09/04/17 0415  PHART 7.446 7.407 7.388  PCO2ART 34.8 39.0 42.8  PO2ART 153.0* 148.0* 175*    Liver Enzymes Recent Labs  Lab 09/03/17 0523 09/07/17 0410  AST 58* 30  ALT 30 21  ALKPHOS 84 64  BILITOT 0.6 0.8  ALBUMIN 2.6* 2.6*    Cardiac Enzymes Recent Labs  Lab 09/04/17 1556 09/04/17 2136 09/05/17 0349  TROPONINI 0.20* 0.19* 0.18*     Glucose Recent Labs  Lab 09/06/17 1143 09/06/17 1607 09/06/17 2027 09/06/17 2327 09/07/17 0415 09/07/17 0832  GLUCAP 113* 200* 262* 115* 153* 168*   Imaging No results found.  STUDIES:  1/17 Renal US >> atrophic right kidney but no hydro Mildly atrophic and echogenic right kidney. Clinical correlation is recommended. No hydronephrosis or shadowing stone; Trace left pleural effusion.  TTE 1/18 >> Dilated LV with moderate LVH, low normal LVF EF 50-55%, mitral calcified annulus withmildly Ca+ MV leaflets and mild MR, mild LAE, grade 2 DD, mild to moderate aortic annular calcification, trivial TR, dilated IVC. TTE 1/22: The cavity size was mildly dilated. There was mild concentric hypertrophy. Systolic function was mildly   reduced. The estimated ejection fraction was in the range of 45%  to 50%. There is akinesis of the basal-midinferior myocardium.  There is severe hypokinesis of the mid-apicalinferolateral myocardium. 1/20 CT chest >> 1. Severe ground-glass opacities in upper lobe mosaic pattern most consistent with pulmonary edema. 2. Mediastinal lymphadenopathy is favored reactive.  EEG 1/22: severe diffuse cerebral dysfunction that is non-specific in etiology and can be seen in the setting of anoxic/ischemic injury, toxic/metabolic encephalopathies, or sedating medications   DISCUSSION: 80 yoF admitted with VDRF post PEA arrest. I think most likely pneumonia, followed by non-STEMI with acute on chronic decompensated heart failure.   Extubated & doing well, diuresing   ASSESSMENT / PLAN:  PEA Arrest - brief 1 min CPR Acute on chronic systolic/diastolic heart failure w/ new NSTEMI Prolonged QTc Hx HTN, HLD Plan Continue carvedilol  Hold Lasix x 24h until K corrected Continue aspirin and Lipitor Clonidine 0.2 mg tid Cardiac cath once renal fn improved  Acute hypoxic respiratory failure in setting of CAP complicated by acute pulmonary edema +/- element of  ALI Tobacco abuse Hx mild asthma  Plan Extubated mobilise  Acute metabolic encephalopathy -->EEG w/ diffuse changes; continues to improve Plan resolved Continue Neurontin and Celexa and Xanax   New fever 1/25. No spike in leukocytosis. Did stop abx on 1/24 Plan PCT algo Repeat sputum  AKI on CKD  Unclear what her baseline is Plan Hold lasix  Hypokalemia and hypernatremia  Plan Dc D5W Replace potassium   Chronic anemia: No evidence of bleeding Plan Transfuse per protocol Continue subcu heparin  IDDM  Plan:   Sliding scale insulin    Dc foley & CVL  DVT prophylaxis: [place] heparin  SUP: stopped Diet: advance Activity: advance Disposition :to tele & [facility] 1/27  Kara Mead MD. FCCP. [facility] Pager [phone] If no response call [phone]   09/07/2017

## 2017-09-07 NOTE — Plan of Care (Signed)
  Cardiac: Ability to achieve and maintain adequate cardiopulmonary perfusion will improve 09/07/2017 2013 - Progressing by Irish Lack, RN   Activity: Capacity to carry out activities will improve 09/07/2017 2013 - Progressing by Irish Lack, RN

## 2017-09-07 NOTE — Progress Notes (Signed)
Physical Therapy Treatment Patient Details Name: Brenda Sims MRN: [id number] DOB: Jul 07, 1956 Today's Date: 09/07/2017    History of Present Illness Pt is a 62 y.o. female admitted  08/29/17 with acute respiratory failure with hypoxia in the setting of CHF; may have left lower lung CAP. Initial presentation with hypoxic respiratory failure due to pulmonary edema (+/- pneumonia) on 01/17, had sudden decompensation with brief PEA arrest on 1/22 and was intubated. Extubated on 09/06/2017. PMH includes HTN, DM.    PT Comments    Pt seen for re-evaluation following her PEA arrest on 1/22. Pt demonstrates limited independence functional mobility secondary to generalized weakness, fatigue, poor motor coordination and balance, as well as poor safety awareness and cognitive deficits (see below). Prior to admission, pt was independent with all functional mobility and ADLs. Pt would greatly benefit from further intensive therapy services at CIR to maximize her independence with functional mobility prior to returning home with family support. PT will continue to follow acutely for mobility progression as tolerated.  Of note - pt on 2L of O2 upon arrival with SPO2 in mid to high 90's. Initiated mobility with pt on RA with SPO2 maintaining >90%; however, pt's finger probe for the pulse ox not assessing throughout, therefore unsure of pt's SPO2 with further mobility. Pt's RN was notified. All other VSS throughout.   Follow Up Recommendations  CIR;Supervision/Assistance - 24 hour     Equipment Recommendations  None recommended by PT    Recommendations for Other Services Rehab consult     Precautions / Restrictions Precautions Precautions: Fall Precaution Comments: watch SPO2 Restrictions Weight Bearing Restrictions: No    Mobility  Bed Mobility Overal bed mobility: Needs Assistance Bed Mobility: Supine to Sit;Sit to Supine     Supine to sit: Min assist Sit to supine: Min assist   General bed  mobility comments: increased time and effort, cueing for sequencing and technique, assist with trunk and with bilateral LEs to return to bed  Transfers Overall transfer level: Needs assistance Equipment used: 1 person hand held assist Transfers: Sit to/from Stand Sit to Stand: Mod assist         General transfer comment: increased time, assistance for stability with transitional movements from EOB  Ambulation/Gait Ambulation/Gait assistance: Mod assist   Assistive device: 1 person hand held assist       General Gait Details: Pt able to take 4-5 lateral steps towards her R side with mod A; pt very unsteady and with poor insight/awareness   Stairs            Wheelchair Mobility    Modified Rankin (Stroke Patients Only)       Balance Overall balance assessment: Needs assistance Sitting-balance support: Feet supported Sitting balance-Leahy Scale: Fair     Standing balance support: During functional activity;Bilateral upper extremity supported Standing balance-Leahy Scale: Poor                              Cognition Arousal/Alertness: Lethargic Behavior During Therapy: Impulsive Overall Cognitive Status: Impaired/Different from baseline Area of Impairment: Following commands;Safety/judgement;Problem solving                       Following Commands: Follows one step commands consistently;Follows multi-step commands inconsistently;Follows multi-step commands with increased time Safety/Judgement: Decreased awareness of deficits;Decreased awareness of safety   Problem Solving: Slow processing;Decreased initiation;Difficulty sequencing;Requires verbal cues;Requires tactile cues  Exercises      General Comments        Pertinent Vitals/Pain Pain Assessment: No/denies pain    Home Living                      Prior Function            PT Goals (current goals can now be found in the care plan section) Acute Rehab PT  Goals PT Goal Formulation: With patient Progress towards PT goals: Progressing toward goals    Frequency    Min 3X/week      PT Plan Discharge plan needs to be updated;Frequency needs to be updated    Co-evaluation              AM-PAC PT "6 Clicks" Daily Activity  Outcome Measure  Difficulty turning over in bed (including adjusting bedclothes, sheets and blankets)?: A Lot Difficulty moving from lying on back to sitting on the side of the bed? : Unable Difficulty sitting down on and standing up from a chair with arms (e.g., wheelchair, bedside commode, etc,.)?: Unable Help needed moving to and from a bed to chair (including a wheelchair)?: A Little Help needed walking in hospital room?: A Lot Help needed climbing 3-5 steps with a railing? : A Lot 6 Click Score: 11    End of Session Equipment Utilized During Treatment: Gait belt Activity Tolerance: Patient limited by fatigue Patient left: in bed;with call bell/phone within reach;with bed alarm set;with family/visitor present;with SCD's reapplied Nurse Communication: Mobility status;Other (comment)(finger probe for pulse ox was not working properly) PT Visit Diagnosis: Other abnormalities of gait and mobility (R26.89)     Time: 2778-2423 PT Time Calculation (min) (ACUTE ONLY): 21 min  Charges:                       G Codes:       Sherie Don, PT, DPT [facility] 09/07/2017, 2:48 PM

## 2017-09-07 NOTE — Progress Notes (Signed)
Progress Note  Patient Name: Brenda Sims Date of Encounter: 09/07/2017  Primary Cardiologist: No primary care provider on file.   Subjective   Alert, oriented. Has a little trouble on focusing on a single topic of conversation. Looks comfortable. Excellent UO. Markedly hypokalemic today. Initial presentation with hypoxic respiratory failure due to pulmonary edema (+/- pneumonia) on 01/17, had sudden decompensation with brief PEA arrest on 1/22 and was intubated. Extubated yesterday 09/06/2017. Echo initially with normal EF and evidence of elevated mean LA pressure, repeat echo showed a slight decline in EF and new wall motion abnormalities. She does not describe angina. Chest CT shows coronary calcifications. Troponin has been mildly elevated in a plateau pattern following the episode of PEA.  Inpatient Medications    Scheduled Meds: . amLODipine  5 mg Oral Daily  . aspirin  81 mg Oral Daily  . atorvastatin  40 mg Oral q1800  . budesonide (PULMICORT) nebulizer solution  0.25 mg Nebulization BID  . carvedilol  6.25 mg Oral BID WC  . Chlorhexidine Gluconate Cloth  6 each Topical Daily  . cloNIDine  0.2 mg Oral Q8H  . furosemide  40 mg Intravenous Daily  . gabapentin  100 mg Oral TID  . heparin injection (subcutaneous)  5,000 Units Subcutaneous Q8H  . hydrALAZINE  100 mg Oral Q8H  . insulin aspart  0-15 Units Subcutaneous Q4H  . ipratropium-albuterol  3 mL Nebulization BID  . loratadine  10 mg Oral Daily  . montelukast  10 mg Oral QHS  . nitroGLYCERIN  0.2 mg Transdermal Q24H  . potassium chloride  40 mEq Oral Once  . sodium chloride flush  10-40 mL Intracatheter Q12H  . zolpidem  5 mg Oral Once   Continuous Infusions: . sodium chloride Stopped (09/04/17 1741)  . dextrose 60 mL/hr at 09/07/17 0600   PRN Meds: acetaminophen (TYLENOL) oral liquid 160 mg/5 mL, albuterol, bisacodyl, docusate, hydrALAZINE, sodium chloride flush   Vital Signs    Vitals:   09/07/17 0600  09/07/17 0700 09/07/17 0807 09/07/17 0833  BP: 133/75 (!) 172/77    Pulse: 79 81    Resp: 18 (!) 22    Temp:    98.6 F (37 C)  TempSrc:    Oral  SpO2: 90% 92% 95%   Weight:      Height:        Intake/Output Summary (Last 24 hours) at 09/07/2017 0919 Last data filed at 09/07/2017 0600 Gross per 24 hour  Intake 1173 ml  Output 3075 ml  Net -1902 ml   Filed Weights   09/04/17 0206 09/05/17 0203 09/06/17 0300  Weight: 178 lb 2.1 oz (80.8 kg) 169 lb 8.5 oz (76.9 kg) 175 lb 0.7 oz (79.4 kg)    Telemetry    NSR - Personally Reviewed  ECG    NSR, LAFB - Personally Reviewed  Physical Exam  Appears comfortable, but has poor attention span and reacts suspiciously to any new sound or motion GEN: No acute distress.   Neck: 8 cm JVD Cardiac: RRR, no murmurs, rubs, S4 gallop present.  Respiratory: Clear to auscultation bilaterally. GI: Soft, nontender, non-distended  MS: No edema; No deformity. Neuro:  Nonfocal  Psych: Normal affect. Sometimes sounds a little paranoid. "My husband has been serving me salt although he knows he shouldn't", "A lot of things don't add up"   Labs    Chemistry Recent Labs  Lab 09/03/17 0523  09/05/17 1601 09/06/17 0440 09/07/17 0410  NA 141   < >  152* 154* 142  K 6.5*   < > 2.7* 3.1* 2.8*  CL 107   < > 108 110 99*  CO2 23   < > 30 30 31   GLUCOSE 156*   < > 131* 173* 149*  BUN 37*   < > 62* 69* 47*  CREATININE 2.07*   < > 2.06* 1.96* 1.56*  CALCIUM 8.5*   < > 8.3* 8.8* 8.8*  PROT 6.4*  --   --   --  6.4*  ALBUMIN 2.6*  --   --   --  2.6*  AST 58*  --   --   --  30  ALT 30  --   --   --  21  ALKPHOS 84  --   --   --  64  BILITOT 0.6  --   --   --  0.8  GFRNONAA 25*   < > 25* 26* 35*  GFRAA 29*   < > 29* 31* 40*  ANIONGAP 11   < > 14 14 12    < > = values in this interval not displayed.     Hematology Recent Labs  Lab 09/05/17 0349 09/05/17 1216 09/06/17 0440 09/07/17 0410  WBC 13.0*  --  11.6* 17.1*  RBC 3.79* 3.91 4.02 3.81*    HGB 8.0*  --  8.2* 7.8*  HCT 24.7*  --  27.0* 25.6*  MCV 65.2*  --  67.2* 67.2*  MCH 21.1*  --  20.4* 20.5*  MCHC 32.4  --  30.4 30.5  RDW 22.1*  --  21.9* 21.7*  PLT 387  --  394 364    Cardiac Enzymes Recent Labs  Lab 09/04/17 0642 09/04/17 1556 09/04/17 2136 09/05/17 0349  TROPONINI 0.14* 0.20* 0.19* 0.18*   No results for input(s): TROPIPOC in the last 168 hours.   BNPNo results for input(s): BNP, PROBNP in the last 168 hours.   DDimer No results for input(s): DDIMER in the last 168 hours.   Radiology    Dg Chest Port 1 View  Result Date: 09/06/2017 CLINICAL DATA:  Pneumonia. EXAM: PORTABLE CHEST 1 VIEW COMPARISON:  09/04/2017. FINDINGS: Endotracheal tube, NG tube, right subclavian line in stable position. Cardiomegaly with pulmonary venous congestion bilateral interstitial prominence and small bilateral pleural effusions noted. Findings consistent CHF. Findings have slightly worsened from prior exam. Low lung volumes with basilar atelectasis. No pneumothorax. IMPRESSION: Lines and tubes in stable position cardiomegaly with bilateral from interstitial prominence of small pleural effusions consistent with CHF. Findings have slightly worsened from prior exam. Low lung volumes with basilar atelectasis. Electronically Signed   By: Marcello Moores  Register   On: 09/06/2017 08:38    Cardiac Studies   Echocardiography, 09/03/2016: Study Conclusions  - Left ventricle: The cavity size was mildly dilated. There was mild concentric hypertrophy. Systolic function was mildly reduced. The estimated ejection fraction was in the range of 45% to 50%. There is akinesis of the basal-midinferior myocardium. There is severe hypokinesis of the mid-apicalinferolateral myocardium. - Aortic valve: Mildly to moderately calcified annulus. Trileaflet; mildly thickened, mildly calcified leaflets. - Mitral valve: Calcified annulus. Mild focal calcification of the anterior leaflet. There was  trivial regurgitation. - Left atrium: The atrium was mildly dilated. - Tricuspid valve: There was trivial regurgitation. - Impressions: mildly reduced LVF with EF 45-50% with basal inferior AK and severe mid to apical inferolateral HK, mildly calcified anterior MV leaflet with mild MR, moderately calcified AV annulus with mild AVSC, mild LAE, trivial TR. Marland Kitchen  Compared to study 08/30/2017, the inferolateral wall is now severely hypokinetic and LVF appears to have declined some.  Impressions:  - mildly reduced LVF with EF 45-50% with basal inferior AK and severe mid to apical inferolateral HK, mildly calcified anterior MV leaflet with mild MR, moderately calcified AV annulus with mild AVSC, mild LAE, trivial TR. Marland KitchenCompared to study 08/30/2017, the inferolateral wall is now severely hypokinetic and LVF appears to have declined some.    Patient Profile     62 y.o. female  with PMH of diastolic HF, IDDM, CKD, and HTN who presented on 08/29/17 with hypoxia, PNA/CHF.Had a PEA arrest on 09/03/17, was intubated and transferred to the ICU. Extubated 09/06/17  According to common-law husband, only prior cardiac issue was hypertension.  Assessment & Plan    1.  PEA/bradycardia asystolic cardiac arrest, quickly resolved.  No focal neuro sequelae, but appears anxious, a little paranoid.  Etiology of arrest is uncertain but not felt to be primarily ischemic.  More likely respiratory. 2.  Probable CAD: There are multiple suggestions for significant CAD. Will plan coronary angio next week, as renal function recovers. Discussed the procedure with her today, but I am not sure she really was paying attention.. 4.  Acute on chronic combined systolic and diastolic heart failure.  Probably still hypervolemic, but improving 5.  HTN: recently added long-acting nitrates and clonidine patch. On carvedilol and amlodipine already. Hold off RAAS inhibitors until she has angiography. 6. Hypokalemia:  replace 7. Acute renal failure: improving rapidly - might already be at baseline (creat 1.43 on admission). 8. DM: on insulin on admission. A1c 6.7% - good control. Consider chronic management with SGLT2 inhibitor rather than insulin. 9. 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 Either way, would probably benefit from statin long term.    For questions or updates, please contact [place] Please consult [email or web address] for contact info under Cardiology/STEMI.      Signed, Sanda Klein, MD  09/07/2017, 9:19 AM

## 2017-09-08 DIAGNOSIS — I5043 Acute on chronic combined systolic (congestive) and diastolic (congestive) heart failure: Secondary | ICD-10-CM

## 2017-09-08 DIAGNOSIS — N189 Chronic kidney disease, unspecified: Secondary | ICD-10-CM

## 2017-09-08 DIAGNOSIS — E119 Type 2 diabetes mellitus without complications: Secondary | ICD-10-CM

## 2017-09-08 DIAGNOSIS — E109 Type 1 diabetes mellitus without complications: Secondary | ICD-10-CM

## 2017-09-08 DIAGNOSIS — N182 Chronic kidney disease, stage 2 (mild): Secondary | ICD-10-CM

## 2017-09-08 DIAGNOSIS — D62 Acute posthemorrhagic anemia: Secondary | ICD-10-CM

## 2017-09-08 DIAGNOSIS — Z931 Gastrostomy status: Secondary | ICD-10-CM

## 2017-09-08 DIAGNOSIS — Z9289 Personal history of other medical treatment: Secondary | ICD-10-CM

## 2017-09-08 LAB — BASIC METABOLIC PANEL
ANION GAP: 15 (ref 5–15)
BUN: 35 mg/dL — ABNORMAL HIGH (ref 6–20)
CALCIUM: 9 mg/dL (ref 8.9–10.3)
CHLORIDE: 100 mmol/L — AB (ref 101–111)
CO2: 26 mmol/L (ref 22–32)
Creatinine, Ser: 1.46 mg/dL — ABNORMAL HIGH (ref 0.44–1.00)
GFR calc non Af Amer: 38 mL/min — ABNORMAL LOW (ref >60)
GFR, EST AFRICAN AMERICAN: 44 mL/min — AB (ref >60)
GLUCOSE: 117 mg/dL — AB (ref 65–99)
Potassium: 3.3 mmol/L — ABNORMAL LOW (ref 3.5–5.1)
Sodium: 141 mmol/L (ref 135–145)

## 2017-09-08 LAB — CULTURE, RESPIRATORY

## 2017-09-08 LAB — CULTURE, RESPIRATORY W GRAM STAIN

## 2017-09-08 LAB — GLUCOSE, CAPILLARY
GLUCOSE-CAPILLARY: 114 mg/dL — AB (ref 65–99)
GLUCOSE-CAPILLARY: 153 mg/dL — AB (ref 65–99)
GLUCOSE-CAPILLARY: 193 mg/dL — AB (ref 65–99)
GLUCOSE-CAPILLARY: 203 mg/dL — AB (ref 65–99)
Glucose-Capillary: 188 mg/dL — ABNORMAL HIGH (ref 65–99)

## 2017-09-08 LAB — CBC
HEMATOCRIT: 26.5 % — AB (ref 36.0–46.0)
HEMOGLOBIN: 8.2 g/dL — AB (ref 12.0–15.0)
MCH: 20.6 pg — ABNORMAL LOW (ref 26.0–34.0)
MCHC: 30.9 g/dL (ref 30.0–36.0)
MCV: 66.6 fL — AB (ref 78.0–100.0)
Platelets: 362 10*3/uL (ref 150–400)
RBC: 3.98 MIL/uL (ref 3.87–5.11)
RDW: 21.7 % — AB (ref 11.5–15.5)
WBC: 13.5 10*3/uL — AB (ref 4.0–10.5)

## 2017-09-08 MED ORDER — SODIUM CHLORIDE 0.9% FLUSH: 3.0000 mL | INTRAVENOUS | Status: DC | PRN

## 2017-09-08 MED ORDER — SODIUM CHLORIDE 0.9 % WEIGHT BASED INFUSION: 3.0000 mL/kg/h | INTRAVENOUS | Status: AC

## 2017-09-08 MED ORDER — POLYETHYLENE GLYCOL 3350 17 G PO PACK
17.0000 g | PACK | Freq: Every day | ORAL | Status: DC
  Administered 2017-09-08 – 2017-09-12 (×4): 17 g via ORAL
  Filled 2017-09-08 (×4): qty 1

## 2017-09-08 MED ORDER — POTASSIUM CHLORIDE 20 MEQ/15ML (10%) PO SOLN
40.0000 meq | Freq: Three times a day (TID) | ORAL | Status: AC
  Administered 2017-09-08 (×3): 40 meq via ORAL
  Filled 2017-09-08 (×3): qty 30

## 2017-09-08 MED ORDER — SODIUM CHLORIDE 0.9% FLUSH
3.0000 mL | Freq: Two times a day (BID) | INTRAVENOUS | Status: DC
  Administered 2017-09-08 – 2017-09-10 (×3): 3 mL via INTRAVENOUS

## 2017-09-08 MED ORDER — SODIUM CHLORIDE 0.9 % IV SOLN: 250.0000 mL | INTRAVENOUS | Status: DC | PRN

## 2017-09-08 MED ORDER — ASPIRIN 81 MG PO CHEW
81.0000 mg | CHEWABLE_TABLET | ORAL | Status: AC
  Administered 2017-09-09: 81 mg via ORAL
  Filled 2017-09-08: qty 1

## 2017-09-08 MED ORDER — SODIUM CHLORIDE 0.9 % WEIGHT BASED INFUSION
1.0000 mL/kg/h | INTRAVENOUS | Status: DC
  Administered 2017-09-09: 1 mL/kg/h via INTRAVENOUS

## 2017-09-08 NOTE — Progress Notes (Signed)
PROGRESS NOTE    Brenda Sims  RXV:[id number] DOB: 08/16/1955 DOA: 08/29/2017 PCP: Patient, No Pcp Per   Chief Complaint  Patient presents with  . Shortness of Breath     Brief Narrative:  HPI On 08/29/2017 by Dr. Lawson Brenda Sims is a 62 y.o. female with medical history significant of hypertension, type 2 diabetes on metformin and Lantus at home, obesity, mild intermittent asthma, knee surgery, presented with shortness of breath, dyspnea on exertion and coughing worsening for last to 3 days.  Patient was moved from [place] to [place] about a month ago.  Patient and her husband reported that the symptoms started before moving here.  She was having coughing and gradually worsening dyspnea on exertion.  The symptoms gradually worsened to the point that she was having difficulty breathing.  She went to the urgent care where she was found to be hypoxic did not improve with the breathing treatment therefore directed her to the ER.  Patient reported subjective fever, chills, nausea and overall not feeling well for last few weeks.  Denies headache, dizziness, abdomen pain, dysuria urgency, diarrhea or constipation.  Patient reports he is chest discomfort with coughing.  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  She retired in 2005 when she had knee surgery.  She lives with her husband.  Recently moved to [place] with the family.  Currently unemployed.  He smokes half a pack of cigarettes every day for a long time.  Interim history Day 5 of admission: Patient noted to be unresponsive and pulseless with subsequent code blue initiated. ROSC was achieved after approximately 1 minute but the patient was noted have agonal breathing. She was subsequently intubated.    Assessment & Plan   PEA arrest -Patient required CPR for approximately 1 minute and achieved RC -She was intubated and successfully extubated on 09/06/2016  Acute on chronic  systolic and diastolic heart failure/NSTEMI -Echocardiogram on 08/30/2017 showed EF of 5093%, grade 2 diastolic dysfunction -Limited Echocardiogram 1/22/2019showed an EF of 45-50%, akinesis of the basal mid inferior myocardium. Severe hypokinesis of the mid apical inferior lateral myocardium -Cardiology consulted and appreciated, plan for right and left heart catheterization with coronary angiogram on 09/09/2016 atrial function has stabilized. -Continue to monitor intake and output, daily weights -continue aspirin, statin  Acute hypoxic respiratory failure in the setting of pneumonia complicated by acute pulmonary edema with an element of acute lung injury/Asthma -As above, patient did require intubation, currently on nasal cannula -Continue to treat underlying conditions, nebs, singulair -Continue supplemental oxygen to maintain saturations above 26%  Acute metabolic encephalopathy -EEG with diffuse changes -Appears to be improving, patient currently alert and oriented 3 however does have tangential thinking  Fever -On 09/06/2017 -was on antibiotics however were discontinued on 09/05/2017  Acute kidney injury on chronic kidney disease, unknown baseline -Creatinine is 1.43, however peaked to 2.38 on admission, currently1.46 -Continue to monitor BMP  Hypokalemia  -Potassium 3.3, continue to replace  Hypernatremia -Resolved, continue to monitor BMP  Chronic anemia, microcytic -Hemoglobin currently 8.2, continue to monitor CBC -Anemia panel: Iron 14, ferritin 87  Diabetes mellitus, type II -Hemoglobin A1c 6.7 -Continue insulin sliding scale CBG monitoring -May consider SGLT2 on discharge  Tobacco abuse -Discussed cessation  Essential hypertension -Continue amlodipine, clonidine, hydralazine  Possible underlying psychological issue/? Paranoia -No history of psychiatric diagnosis however patient was very talkative this morning and tangential. 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 -Patient is currently alert and oriented 3, understands where she is why she is here. -Continue to monitor closely  DVT Prophylaxis  heparin  Code Status: Full  Family Communication: None at bedside  Disposition Plan: Admitted,   Consultants PCCM Cardiology  Procedures  EEG Echocardiogram Limited echocardiogram Intubation/extubation  Antibiotics   Anti-infectives (From admission, onward)   Start     Dose/Rate Route Frequency Ordered Stop   09/03/17 1600  Ampicillin-Sulbactam (UNASYN) 3 g in sodium chloride 0.9 % 100 mL IVPB     3 g 200 mL/hr over 30 Minutes Intravenous Every 12 hours 09/03/17 1034 09/05/17 1601   09/03/17 1500  ampicillin-sulbactam (UNASYN) 1.5 g in sodium chloride 0.9 % 50 mL IVPB  Status:  Discontinued     1.5 g 100 mL/hr over 30 Minutes Intravenous Every 8 hours 09/03/17 1031 09/03/17 1034   09/03/17 1000  amoxicillin-clavulanate (AUGMENTIN) 875-125 MG per tablet 1 tablet  Status:  Discontinued     1 tablet Per Tube Every 12 hours 09/03/17 0536 09/03/17 1027   09/01/17 1315  amoxicillin-clavulanate (AUGMENTIN) 875-125 MG per tablet 1 tablet  Status:  Discontinued     1 tablet Oral Every 12 hours 09/01/17 1302 09/03/17 0536   08/29/17 1900  levofloxacin (LEVAQUIN) IVPB 500 mg  Status:  Discontinued     500 mg 100 mL/hr over 60 Minutes Intravenous Every 24 hours 08/29/17 1827 09/01/17 0809      Subjective:   Analiyah Yeargan seen and examined today.  No complaints. Feels breathing is better. Denies current chest pain, abdominal pain, nausea vomiting, diarrhea or constipation, headache or dizziness.  Objective:   Vitals:   09/08/17 0526 09/08/17 0855 09/08/17 0928 09/08/17 0931  BP: (!) 145/92  109/89   Pulse:    78  Resp:      Temp:      TempSrc:      SpO2:  95%    Weight: 81.2 kg (179 lb 0.2 oz)     Height:        Intake/Output Summary (Last 24 hours) at 09/08/2017 1146 Last data filed  at 09/08/2017 1000 Gross per 24 hour  Intake 260 ml  Output -  Net 260 ml   Filed Weights   09/06/17 0300 09/07/17 2002 09/08/17 0526  Weight: 79.4 kg (175 lb 0.7 oz) 75 kg (165 lb 5.5 oz) 81.2 kg (179 lb 0.2 oz)    Exam  General: Well developed, well nourished, NAD, appears stated age  HEENT: NCAT,  mucous membranes moist.   Cardiovascular: S1 S2 auscultated, no rubs, murmurs or gallops. Regular rate and rhythm. +S4  Respiratory: Clear to auscultation bilaterally with equal chest rise  Abdomen: Soft, Obese, nontender, nondistended, + bowel sounds  Extremities: warm dry without cyanosis clubbing or edema  Neuro: AAOx3,  nonfocal  Psych:  strange affect however pleasant  Data Reviewed: I have personally reviewed following labs and imaging studies  CBC: Recent Labs  Lab 09/03/17 0523 09/04/17 0403 09/05/17 0349 09/06/17 0440 09/07/17 0410 09/08/17 0736  WBC 17.6* 16.1* 13.0* 11.6* 17.1* 13.5*  NEUTROABS 15.5*  --   --   --   --   --   HGB 7.4* 7.4* 8.0* 8.2* 7.8* 8.2*  HCT 23.7* 23.0* 24.7* 27.0* 25.6* 26.5*  MCV 66.6* 65.2* 65.2* 67.2* 67.2* 66.6*  PLT 317 328 387 394 364 408   Basic Metabolic Panel: Recent Labs  Lab 09/04/17 0403 09/04/17 1205 09/04/17 1556 09/05/17  1749 09/05/17 1601 09/06/17 0440 09/07/17 0410 09/08/17 0736  NA 143  --   --  148* 152* 154* 142 141  K 3.8  --   --  3.0* 2.7* 3.1* 2.8* 3.3*  CL 105  --   --  107 108 110 99* 100*  CO2 23  --   --  24 30 30 31 26   GLUCOSE 162*  --   --  156* 131* 173* 149* 117*  BUN 50*  --   --  76* 62* 69* 47* 35*  CREATININE 2.31*  --   --  2.38* 2.06* 1.96* 1.56* 1.46*  CALCIUM 8.3*  --   --  8.4* 8.3* 8.8* 8.8* 9.0  MG 2.3 2.4 2.5* 2.3 2.3  --   --   --   PHOS 8.1* 8.9* 8.5* 6.3* 6.6*  --   --   --    GFR: Estimated Creatinine Clearance: 41.7 mL/min (A) (by C-G formula based on SCr of 1.46 mg/dL (H)). Liver Function Tests: Recent Labs  Lab 09/03/17 0523 09/07/17 0410  AST 58* 30  ALT 30  21  ALKPHOS 84 64  BILITOT 0.6 0.8  PROT 6.4* 6.4*  ALBUMIN 2.6* 2.6*   No results for input(s): LIPASE, AMYLASE in the last 168 hours. No results for input(s): AMMONIA in the last 168 hours. Coagulation Profile: Recent Labs  Lab 09/03/17 0523  INR 1.30   Cardiac Enzymes: Recent Labs  Lab 09/04/17 0642 09/04/17 1556 09/04/17 2136 09/05/17 0349  TROPONINI 0.14* 0.20* 0.19* 0.18*   BNP (last 3 results) No results for input(s): PROBNP in the last 8760 hours. HbA1C: No results for input(s): HGBA1C in the last 72 hours. CBG: Recent Labs  Lab 09/07/17 1745 09/08/17 0003 09/08/17 0437 09/08/17 0814 09/08/17 1123  GLUCAP 132* 165* 114* 153* 193*   Lipid Profile: Recent Labs    09/06/17 0442  TRIG 234*   Thyroid Function Tests: No results for input(s): TSH, T4TOTAL, FREET4, T3FREE, THYROIDAB in the last 72 hours. Anemia Panel: Recent Labs    09/05/17 1216  VITAMINB12 239  FOLATE 13.4  FERRITIN 87  TIBC 295  IRON 14*  RETICCTPCT 2.3   Urine analysis:    Component Value Date/Time   COLORURINE YELLOW 08/30/2017 0159   APPEARANCEUR CLEAR 08/30/2017 0159   LABSPEC 1.010 08/30/2017 0159   PHURINE 5.0 08/30/2017 0159   GLUCOSEU 50 (A) 08/30/2017 0159   HGBUR NEGATIVE 08/30/2017 0159   BILIRUBINUR NEGATIVE 08/30/2017 0159   KETONESUR NEGATIVE 08/30/2017 0159   PROTEINUR 100 (A) 08/30/2017 0159   NITRITE NEGATIVE 08/30/2017 0159   LEUKOCYTESUR NEGATIVE 08/30/2017 0159   Sepsis Labs: @LABRCNTIP (procalcitonin:4,lacticidven:4)  ) Recent Results (from the past 240 hour(s))  Respiratory Panel by PCR     Status: None   Collection Time: 09/01/17 11:10 AM  Result Value Ref Range Status   Adenovirus NOT DETECTED NOT DETECTED Final   Coronavirus 229E NOT DETECTED NOT DETECTED Final   Coronavirus HKU1 NOT DETECTED NOT DETECTED Final   Coronavirus NL63 NOT DETECTED NOT DETECTED Final   Coronavirus OC43 NOT DETECTED NOT DETECTED Final   Metapneumovirus NOT  DETECTED NOT DETECTED Final   Rhinovirus / Enterovirus NOT DETECTED NOT DETECTED Final   Influenza A NOT DETECTED NOT DETECTED Final   Influenza B NOT DETECTED NOT DETECTED Final   Parainfluenza Virus 1 NOT DETECTED NOT DETECTED Final   Parainfluenza Virus 2 NOT DETECTED NOT DETECTED Final   Parainfluenza Virus 3 NOT DETECTED NOT DETECTED Final  Parainfluenza Virus 4 NOT DETECTED NOT DETECTED Final   Respiratory Syncytial Virus NOT DETECTED NOT DETECTED Final   Bordetella pertussis NOT DETECTED NOT DETECTED Final   Chlamydophila pneumoniae NOT DETECTED NOT DETECTED Final   Mycoplasma pneumoniae NOT DETECTED NOT DETECTED Final  MRSA PCR Screening     Status: None   Collection Time: 09/03/17  4:42 AM  Result Value Ref Range Status   MRSA by PCR NEGATIVE NEGATIVE Final    Comment:        The GeneXpert MRSA Assay (FDA approved for NASAL specimens only), is one component of a comprehensive MRSA colonization surveillance program. It is not intended to diagnose MRSA infection nor to guide or monitor treatment for MRSA infections.   Culture, respiratory (NON-Expectorated)     Status: None (Preliminary result)   Collection Time: 09/06/17 10:47 AM  Result Value Ref Range Status   Specimen Description TRACHEAL ASPIRATE  Final   Special Requests NONE  Final   Gram Stain   Final    MODERATE WBC PRESENT, PREDOMINANTLY MONONUCLEAR RARE SQUAMOUS EPITHELIAL CELLS PRESENT NO ORGANISMS SEEN    Culture CULTURE REINCUBATED FOR BETTER GROWTH  Final   Report Status PENDING  Incomplete      Radiology Studies: No results found.   Scheduled Meds: . amLODipine  5 mg Oral Daily  . aspirin  81 mg Oral Daily  . atorvastatin  40 mg Oral q1800  . budesonide (PULMICORT) nebulizer solution  0.25 mg Nebulization BID  . carvedilol  6.25 mg Oral BID WC  . Chlorhexidine Gluconate Cloth  6 each Topical Daily  . cloNIDine  0.2 mg Oral Q8H  . gabapentin  100 mg Oral TID  . heparin injection  (subcutaneous)  5,000 Units Subcutaneous Q8H  . hydrALAZINE  100 mg Oral Q8H  . insulin aspart  0-15 Units Subcutaneous Q4H  . ipratropium-albuterol  3 mL Nebulization BID  . loratadine  10 mg Oral Daily  . montelukast  10 mg Oral QHS  . nitroGLYCERIN  0.2 mg Transdermal Q24H  . polyethylene glycol  17 g Oral Daily  . potassium chloride  40 mEq Oral TID  . sodium chloride flush  10-40 mL Intracatheter Q12H  . zolpidem  5 mg Oral Once   Continuous Infusions: . sodium chloride Stopped (09/04/17 1741)     LOS: 10 days   Time Spent in minutes   30 minutes  Karlene Southard D.O. on 09/08/2017 at 11:46 AM  Between 7am to 7pm - Pager - [phone]  After 7pm go to [email or web address] - password [email or web address]  And look for the night coverage person covering for me after hours  [facility] Office  [phone]

## 2017-09-08 NOTE — Progress Notes (Signed)
Progress Note  Patient Name: Brenda Sims Date of Encounter: 09/08/2017  Primary Cardiologist: No primary care provider on file.   Subjective   She looks very comfortable. She denies dyspnea or chest pain. Still has a lot of difficulty focusing on the topic at hand.  Inpatient Medications    Scheduled Meds: . amLODipine  5 mg Oral Daily  . aspirin  81 mg Oral Daily  . atorvastatin  40 mg Oral q1800  . budesonide (PULMICORT) nebulizer solution  0.25 mg Nebulization BID  . carvedilol  6.25 mg Oral BID WC  . Chlorhexidine Gluconate Cloth  6 each Topical Daily  . cloNIDine  0.2 mg Oral Q8H  . gabapentin  100 mg Oral TID  . heparin injection (subcutaneous)  5,000 Units Subcutaneous Q8H  . hydrALAZINE  100 mg Oral Q8H  . insulin aspart  0-15 Units Subcutaneous Q4H  . ipratropium-albuterol  3 mL Nebulization BID  . loratadine  10 mg Oral Daily  . montelukast  10 mg Oral QHS  . nitroGLYCERIN  0.2 mg Transdermal Q24H  . polyethylene glycol  17 g Oral Daily  . potassium chloride  40 mEq Oral TID  . sodium chloride flush  10-40 mL Intracatheter Q12H  . zolpidem  5 mg Oral Once   Continuous Infusions: . sodium chloride Stopped (09/04/17 1741)   PRN Meds: acetaminophen (TYLENOL) oral liquid 160 mg/5 mL, albuterol, bisacodyl, docusate, hydrALAZINE, sodium chloride flush, temazepam   Vital Signs    Vitals:   09/08/17 0429 09/08/17 0525 09/08/17 0526 09/08/17 0855  BP: 137/73 (!) 145/92 (!) 145/92   Pulse: 85     Resp: 17     Temp: 99.1 F (37.3 C)     TempSrc: Oral     SpO2: 95%   95%  Weight:   179 lb 0.2 oz (81.2 kg)   Height:        Intake/Output Summary (Last 24 hours) at 09/08/2017 0909 Last data filed at 09/07/2017 2246 Gross per 24 hour  Intake 70 ml  Output 550 ml  Net -480 ml   Filed Weights   09/06/17 0300 09/07/17 2002 09/08/17 0526  Weight: 175 lb 0.7 oz (79.4 kg) 165 lb 5.5 oz (75 kg) 179 lb 0.2 oz (81.2 kg)    Telemetry    NSR - Personally  Reviewed  ECG    No new tracing - Personally Reviewed  Physical Exam  Obese, looks very comfortable GEN: No acute distress.   Neck: roughly 7-8 cm JVD Cardiac: RRR, no murmurs or rubs, S4 gallop present.  Respiratory: Clear to auscultation bilaterally. GI: Soft, nontender, non-distended  MS: No edema; No deformity. Neuro:  Nonfocal  Psych: Normal affect   Labs    Chemistry Recent Labs  Lab 09/03/17 0523  09/06/17 0440 09/07/17 0410 09/08/17 0736  NA 141   < > 154* 142 141  K 6.5*   < > 3.1* 2.8* 3.3*  CL 107   < > 110 99* 100*  CO2 23   < > 30 31 26   GLUCOSE 156*   < > 173* 149* 117*  BUN 37*   < > 69* 47* 35*  CREATININE 2.07*   < > 1.96* 1.56* 1.46*  CALCIUM 8.5*   < > 8.8* 8.8* 9.0  PROT 6.4*  --   --  6.4*  --   ALBUMIN 2.6*  --   --  2.6*  --   AST 58*  --   --  30  --  ALT 30  --   --  21  --   ALKPHOS 84  --   --  64  --   BILITOT 0.6  --   --  0.8  --   GFRNONAA 25*   < > 26* 35* 38*  GFRAA 29*   < > 31* 40* 44*  ANIONGAP 11   < > 14 12 15    < > = values in this interval not displayed.     Hematology Recent Labs  Lab 09/06/17 0440 09/07/17 0410 09/08/17 0736  WBC 11.6* 17.1* 13.5*  RBC 4.02 3.81* 3.98  HGB 8.2* 7.8* 8.2*  HCT 27.0* 25.6* 26.5*  MCV 67.2* 67.2* 66.6*  MCH 20.4* 20.5* 20.6*  MCHC 30.4 30.5 30.9  RDW 21.9* 21.7* 21.7*  PLT 394 364 362    Cardiac Enzymes Recent Labs  Lab 09/04/17 0642 09/04/17 1556 09/04/17 2136 09/05/17 0349  TROPONINI 0.14* 0.20* 0.19* 0.18*   No results for input(s): TROPIPOC in the last 168 hours.   BNPNo results for input(s): BNP, PROBNP in the last 168 hours.   DDimer No results for input(s): DDIMER in the last 168 hours.   Radiology    No results found.  Cardiac Studies   Echocardiography, 09/03/2016: Study Conclusions  - Left ventricle: The cavity size was mildly dilated. There was mild concentric hypertrophy. Systolic function was mildly reduced. The estimated ejection fraction  was in the range of 45% to 50%. There is akinesis of the basal-midinferior myocardium. There is severe hypokinesis of the mid-apicalinferolateral myocardium. - Aortic valve: Mildly to moderately calcified annulus. Trileaflet; mildly thickened, mildly calcified leaflets. - Mitral valve: Calcified annulus. Mild focal calcification of the anterior leaflet. There was trivial regurgitation. - Left atrium: The atrium was mildly dilated. - Tricuspid valve: There was trivial regurgitation. - Impressions: mildly reduced LVF with EF 45-50% with basal inferior AK and severe mid to apical inferolateral HK, mildly calcified anterior MV leaflet with mild MR, moderately calcified AV annulus with mild AVSC, mild LAE, trivial TR. Marland KitchenCompared to study 08/30/2017, the inferolateral wall is now severely hypokinetic and LVF appears to have declined some.  Impressions:  - mildly reduced LVF with EF 45-50% with basal inferior AK and severe mid to apical inferolateral HK, mildly calcified anterior MV leaflet with mild MR, moderately calcified AV annulus with mild AVSC, mild LAE, trivial TR. Marland KitchenCompared to study 08/30/2017, the inferolateral wall is now severely hypokinetic and LVF appears to have declined some.     Patient Profile     62 y.o. female with PMH of diastolic HF, IDDM, CKD, and HTN who presented on 08/29/17 with hypoxia, PNA/CHF.Had a PEA arrest on 09/03/17, was intubated and transferred to the ICU. Extubated 01/25/19According to common-law husband, only prior cardiac issue was hypertension.  Assessment & Plan    1. PEA/bradycardia asystolic cardiac arrest: quickly resolved. No focal neuro sequelae, but appears anxious, a little paranoid. Etiology of arrest is uncertain but not felt to be primarily ischemic. More likely respiratory. 2. Probable MQK:MMNOT are multiple suggestions for significant CAD. Will plan right and left heart catheterization and coronary  angio tomorrow, as renal function seems to have stabilized. Discussed the diagnostic procedure as well as possible angioplasty and stent with her and I believe she did follow the discussion. She asked several questions. She understands the risks, complications as well as the benefits of the procedure. This procedure has been fully reviewed with the patient and written informed consent has been obtained. 4. Acute  on chronic combined systolic and diastolic heart failure:still seems to have some signs of elevated right heart filling pressures,but does not have dyspnea. 5. HTN: recently added long-acting nitrates and clonidine patch. On carvedilol and amlodipine already. Hold off RAAS inhibitors until she has angiography. Blood pressure control is better today. Target 1:30/80. 6. Hypokalemia: replace 7. Acute renal failure: improved rapidly and appears to be at baseline (creat 1.43 on admission). 8. DM: on insulin on admission. A1c 6.7% - good control. Consider chronic management with SGLT2 inhibitor rather than insulin. 9. 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 Either way, would probably benefit from statin long term.  She continues to have poor attention span and flight of ideas. 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  Despite all this, she is oriented to time place person and circumstance.   For questions or updates, please contact [place] Please consult [email or web address] for contact info under Cardiology/STEMI.      Signed, Sanda Klein, MD  09/08/2017, 9:09 AM

## 2017-09-09 ENCOUNTER — Encounter (HOSPITAL_COMMUNITY)
Admission: EM | Disposition: A | Discharge status: Home / Self Care | Payer: Self-pay | Source: Home / Self Care | Attending: Critical Care Medicine

## 2017-09-09 ENCOUNTER — Inpatient Hospital Stay: Payer: Medicaid - Out of State

## 2017-09-09 DIAGNOSIS — E1129 Type 2 diabetes mellitus with other diabetic kidney complication: Secondary | ICD-10-CM

## 2017-09-09 DIAGNOSIS — D72829 Elevated white blood cell count, unspecified: Secondary | ICD-10-CM

## 2017-09-09 DIAGNOSIS — IMO0001 Reserved for inherently not codable concepts without codable children: Secondary | ICD-10-CM

## 2017-09-09 DIAGNOSIS — R569 Unspecified convulsions: Secondary | ICD-10-CM

## 2017-09-09 DIAGNOSIS — D62 Acute posthemorrhagic anemia: Secondary | ICD-10-CM

## 2017-09-09 LAB — GLUCOSE, CAPILLARY
GLUCOSE-CAPILLARY: 224 mg/dL — AB (ref 65–99)
Glucose-Capillary: 123 mg/dL — ABNORMAL HIGH (ref 65–99)
Glucose-Capillary: 124 mg/dL — ABNORMAL HIGH (ref 65–99)
Glucose-Capillary: 141 mg/dL — ABNORMAL HIGH (ref 65–99)
Glucose-Capillary: 293 mg/dL — ABNORMAL HIGH (ref 65–99)
Glucose-Capillary: 80 mg/dL (ref 65–99)

## 2017-09-09 LAB — BASIC METABOLIC PANEL
ANION GAP: 9 (ref 5–15)
BUN: 30 mg/dL — AB (ref 6–20)
CALCIUM: 8.7 mg/dL — AB (ref 8.9–10.3)
CO2: 27 mmol/L (ref 22–32)
CREATININE: 1.36 mg/dL — AB (ref 0.44–1.00)
Chloride: 105 mmol/L (ref 101–111)
GFR calc Af Amer: 48 mL/min — ABNORMAL LOW (ref >60)
GFR calc non Af Amer: 41 mL/min — ABNORMAL LOW (ref >60)
GLUCOSE: 133 mg/dL — AB (ref 65–99)
Potassium: 4.1 mmol/L (ref 3.5–5.1)
Sodium: 141 mmol/L (ref 135–145)

## 2017-09-09 LAB — CBC
HEMATOCRIT: 24 % — AB (ref 36.0–46.0)
Hemoglobin: 7.5 g/dL — ABNORMAL LOW (ref 12.0–15.0)
MCH: 20.8 pg — AB (ref 26.0–34.0)
MCHC: 31.3 g/dL (ref 30.0–36.0)
MCV: 66.7 fL — AB (ref 78.0–100.0)
Platelets: 336 10*3/uL (ref 150–400)
RBC: 3.6 MIL/uL — ABNORMAL LOW (ref 3.87–5.11)
RDW: 22.6 % — AB (ref 11.5–15.5)
WBC: 12.3 10*3/uL — ABNORMAL HIGH (ref 4.0–10.5)

## 2017-09-09 SURGERY — RIGHT/LEFT HEART CATH AND CORONARY ANGIOGRAPHY
Anesthesia: LOCAL

## 2017-09-09 NOTE — Consult Note (Signed)
Physical Medicine and Rehabilitation Consult   Reason for Consult: Anoxic encephalopathy with debility Referring Physician: Dr. Ree Kida   HPI: Brenda Sims is a 62 y.o. female with history of CKD, T2DM who was admitted on 08/29/17 with one month history of progressive dyspnea and hypoxia. History taken from chart review, patient, and boyfriend. She was found to have RLL PNA as well as acute CHF treated with IV antibiotics and diuresis. CPAP started due to evidence of OSA and electrolyte abnormalities supplemented. CT chest done due to ongoing issues with hypoxia and revealed severe ground glass opacities in upper lobe consistent with pulmonary edema as well as reactive mediastinal lymphadenopathy. She required intubation briefly on 09/03/17 due to cardiopulmonary arrest with ROSC in approximately 1 minute.   2D echo done revealing EF 45-50% with mild to moderate aortic calcification, basal and inferior akines and severe mid to apical inferolateral hypokinesis.   EEG done revealing severe diffuse cerebral dysfunction question due to anoxic/ischemic injury. CT head reviewed, unremarkable for acute process. 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 Dr. Tamala Julian felt that patient with evidence of CAD and respiratory failure likely due to acute on chronic CHF. Renal status is back to baseline and plans for cardiac cath tomorrow.  She continues to have issues with cognition due to hypoxic encephalopathy as well as generalized weakness. CIR recommended due to functional deficits.     Review of Systems  HENT: Negative for hearing loss and tinnitus.   Eyes: Negative for blurred vision and double vision.  Respiratory: Positive for cough (for months. ). Negative for shortness of breath.   Cardiovascular: Negative for chest pain and palpitations.  Gastrointestinal: Negative for heartburn and nausea.  Musculoskeletal:  Negative for back pain, myalgias and neck pain.  Skin: Negative for rash.  Neurological: Positive for weakness. Negative for dizziness, sensory change, speech change, focal weakness and headaches.  Psychiatric/Behavioral: Positive for memory loss.  All other systems reviewed and are negative.    Past Medical History:  Diagnosis Date  . CKD (chronic kidney disease)   . Diabetes mellitus without complication ([place])   . Hypertension     Past Surgical History:  Procedure Laterality Date  . APPENDECTOMY    . BACK SURGERY    . TOTAL KNEE ARTHROPLASTY Right 2007    Family History: Unable to recall.     Social History:  Moved here from [place] a few weeks ago to live with  Celesta Gentile (met on Facebook). She  reports that  has never smoked. she has never used smokeless tobacco. She reports that she does not drink alcohol or use drugs.    Allergies  Allergen Reactions  . Azithromycin Swelling  . Eggs Or Egg-Derived Products     Per pt allergy to egg. Makes her break out and have difficulty breathing.    Medications Prior to Admission  Medication Sig Dispense Refill  . aspirin EC 81 MG tablet Take 81 mg by mouth daily.    . cetirizine (ZYRTEC) 10 MG tablet Take 10 mg by mouth daily.    . citalopram (CELEXA) 20 MG tablet Take 20 mg by mouth daily.    . cloNIDine (CATAPRES - DOSED IN MG/24 HR) 0.2 mg/24hr patch Place 0.2 mg onto the skin once a week.    . furosemide (LASIX) 20 MG tablet Take 20 mg by mouth daily.    Marland Kitchen gabapentin (NEURONTIN) 300 MG capsule Take 300  mg by mouth 3 (three) times daily.    . insulin aspart (NOVOLOG) 100 UNIT/ML injection Inject 20 Units into the skin 3 (three) times daily before meals.     Marland Kitchen labetalol (NORMODYNE) 200 MG tablet Take 200 mg by mouth 2 (two) times daily.    . metFORMIN (GLUCOPHAGE) 1000 MG tablet Take 1,000 mg by mouth 2 (two) times daily with a meal.    . montelukast (SINGULAIR) 10 MG tablet Take 10 mg by mouth at bedtime.    Marland Kitchen NIFEdipine  (ADALAT CC) 90 MG 24 hr tablet Take 90 mg by mouth daily.      Home: Home Living Family/patient expects to be discharged to:: Private residence Living Arrangements: Spouse/significant other, Parent Available Help at Discharge: Family, Available PRN/intermittently Type of Home: House Home Access: Stairs to enter, Ramped entrance Technical brewer of Steps: 3 Entrance Stairs-Rails: Right, Left, Can reach both Home Layout: One level Bathroom Shower/Tub: Tub/shower unit, Architectural technologist: Standard Bathroom Accessibility: Yes Home Equipment: Careers adviser History: Prior Function Level of Independence: Independent Functional Status:  Mobility: Bed Mobility Overal bed mobility: Needs Assistance Bed Mobility: Supine to Sit, Sit to Supine Supine to sit: Min assist Sit to supine: Min assist General bed mobility comments: increased time and effort, cueing for sequencing and technique, assist with trunk and with bilateral LEs to return to bed Transfers Overall transfer level: Needs assistance Equipment used: 1 person hand held assist Transfers: Sit to/from Stand Sit to Stand: Mod assist General transfer comment: increased time, assistance for stability with transitional movements from EOB Ambulation/Gait Ambulation/Gait assistance: Mod assist Ambulation Distance (Feet): 500 Feet Assistive device: 1 person hand held assist Gait Pattern/deviations: WFL(Within Functional Limits) General Gait Details: Pt able to take 4-5 lateral steps towards her R side with mod A; pt very unsteady and with poor insight/awareness    ADL: ADL Overall ADL's : Modified independent General ADL Comments: educated on energy conservation techniques, use of spirometer  Cognition: Cognition Overall Cognitive Status: Impaired/Different from baseline Orientation Level: Oriented X4 Cognition Arousal/Alertness: Lethargic Behavior During Therapy: Impulsive Overall Cognitive Status:  Impaired/Different from baseline Area of Impairment: Following commands, Safety/judgement, Problem solving Following Commands: Follows one step commands consistently, Follows multi-step commands inconsistently, Follows multi-step commands with increased time Safety/Judgement: Decreased awareness of deficits, Decreased awareness of safety Problem Solving: Slow processing, Decreased initiation, Difficulty sequencing, Requires verbal cues, Requires tactile cues   Blood pressure 138/79, pulse 79, temperature 99.4 F (37.4 C), temperature source Oral, resp. rate 16, height '5\' 4"'  (1.626 m), weight 82.2 kg (181 lb 3.5 oz), SpO2 100 %. Physical Exam  Nursing note and vitals reviewed. Constitutional: She is oriented to person, place, and time. She appears well-developed and well-nourished. No distress.  HENT:  Head: Normocephalic and atraumatic.  Mouth/Throat: Oropharynx is clear and moist.  Eyes: Conjunctivae and EOM are normal. Pupils are equal, round, and reactive to light.  Neck: Normal range of motion. Neck supple.  Cardiovascular: Normal rate and regular rhythm.  Respiratory: Effort normal and breath sounds normal. No stridor. No respiratory distress. She has no wheezes.  +[place]  GI: Soft. Bowel sounds are normal. She exhibits no distension.  Musculoskeletal: She exhibits no edema or tenderness.  Neurological: She is alert and oriented to person, place, and time.  Speech slightly tangential.  Able to follow simple commands Motor: Grossly 5-/5 throughout  Skin: Skin is warm and dry. She is not diaphoretic.  Psychiatric: Her speech is tangential. She is inattentive.  Results for orders placed or performed during the hospital encounter of 08/29/17 (from the past 24 hour(s))  Glucose, capillary     Status: Abnormal   Collection Time: 09/08/17 11:23 AM  Result Value Ref Range   Glucose-Capillary 193 (H) 65 - 99 mg/dL  Glucose, capillary     Status: Abnormal   Collection Time: 09/08/17   4:51 PM  Result Value Ref Range   Glucose-Capillary 203 (H) 65 - 99 mg/dL  Glucose, capillary     Status: Abnormal   Collection Time: 09/08/17  9:46 PM  Result Value Ref Range   Glucose-Capillary 188 (H) 65 - 99 mg/dL  Glucose, capillary     Status: Abnormal   Collection Time: 09/09/17 12:25 AM  Result Value Ref Range   Glucose-Capillary 124 (H) 65 - 99 mg/dL  Glucose, capillary     Status: Abnormal   Collection Time: 09/09/17  5:00 AM  Result Value Ref Range   Glucose-Capillary 141 (H) 65 - 99 mg/dL  CBC     Status: Abnormal   Collection Time: 09/09/17  6:00 AM  Result Value Ref Range   WBC 12.3 (H) 4.0 - 10.5 K/uL   RBC 3.60 (L) 3.87 - 5.11 MIL/uL   Hemoglobin 7.5 (L) 12.0 - 15.0 g/dL   HCT 24.0 (L) 36.0 - 46.0 %   MCV 66.7 (L) 78.0 - 100.0 fL   MCH 20.8 (L) 26.0 - 34.0 pg   MCHC 31.3 30.0 - 36.0 g/dL   RDW 22.6 (H) 11.5 - 15.5 %   Platelets 336 150 - 400 K/uL  Basic metabolic panel     Status: Abnormal   Collection Time: 09/09/17  6:00 AM  Result Value Ref Range   Sodium 141 135 - 145 mmol/L   Potassium 4.1 3.5 - 5.1 mmol/L   Chloride 105 101 - 111 mmol/L   CO2 27 22 - 32 mmol/L   Glucose, Bld 133 (H) 65 - 99 mg/dL   BUN 30 (H) 6 - 20 mg/dL   Creatinine, Ser 1.36 (H) 0.44 - 1.00 mg/dL   Calcium 8.7 (L) 8.9 - 10.3 mg/dL   GFR calc non Af Amer 41 (L) >60 mL/min   GFR calc Af Amer 48 (L) >60 mL/min   Anion gap 9 5 - 15  Glucose, capillary     Status: Abnormal   Collection Time: 09/09/17  7:31 AM  Result Value Ref Range   Glucose-Capillary 123 (H) 65 - 99 mg/dL   No results found.  Assessment/Plan: Diagnosis: Encephalopathy and debility Labs and images independently reviewed.  Records reviewed and summated above.  1. Does the need for close, 24 hr/day medical supervision in concert with the patient's rehab needs make it unreasonable for this patient to be served in a less intensive setting? Potentially  2. Co-Morbidities requiring supervision/potential  complications: CKD (avoid nephrotoxic meds), T2 DM (Monitor in accordance with exercise and adjust meds as necessary), combined CHF (Monitor in accordance with increased physical activity and avoid UE resistance excercises), leukocytosis (cont to monitor for signs and symptoms of infection, further workup if indicated), ABLA (transfuse if necessary to ensure appropriate perfusion for increased activity tolerance), seizures (meds) 3. Due to safety, disease management and patient education, does the patient require 24 hr/day rehab nursing? Yes 4. Does the patient require coordinated care of a physician, rehab nurse, PT (1-2 hrs/day, 5 days/week), OT (1-2 hrs/day, 5 days/week) and SLP (1-2 hrs/day, 5 days/week) to address physical and functional deficits in the context of  the above medical diagnosis(es)? Yes Addressing deficits in the following areas: balance, endurance, locomotion, strength, transferring, bathing, dressing, toileting, cognition, language and psychosocial support 5. Can the patient actively participate in an intensive therapy program of at least 3 hrs of therapy per day at least 5 days per week? Yes 6. The potential for patient to make measurable gains while on inpatient rehab is excellent 7. Anticipated functional outcomes upon discharge from inpatient rehab are supervision  with PT, supervision with OT, supervision with SLP. 8. Estimated rehab length of stay to reach the above functional goals is: 7-11 days. 9. Anticipated D/C setting: Home 10. Anticipated post D/C treatments: HH therapy and Home excercise program 11. Overall Rehab/Functional Prognosis: good  RECOMMENDATIONS: This patient's condition is appropriate for continued rehabilitative care in the following setting: Recommend reeval after procedure tomorrow.  If she has persistent deficits and cleared by Cards, recommend CIR. Patient has agreed to participate in recommended program. Potentially Note that insurance prior  authorization may be required for reimbursement for recommended care.  Comment: Rehab Admissions Coordinator to follow up.  Delice Lesch, MD, ABPMR Bary Leriche, PA-C 09/09/2017

## 2017-09-09 NOTE — Progress Notes (Signed)
PROGRESS NOTE    Brenda Sims  QPR:[id number] DOB: November 04, 1955 DOA: 08/29/2017 PCP: Patient, No Pcp Per   Chief Complaint  Patient presents with  . Shortness of Breath     Brief Narrative:  HPI On 08/29/2017 by Dr. Lawson Radar Brenda Sims is a 62 y.o. female with medical history significant of hypertension, type 2 diabetes on metformin and Lantus at home, obesity, mild intermittent asthma, knee surgery, presented with shortness of breath, dyspnea on exertion and coughing worsening for last to 3 days.  Patient was moved from [place] to [place] about a month ago.  Patient and her husband reported that the symptoms started before moving here.  She was having coughing and gradually worsening dyspnea on exertion.  The symptoms gradually worsened to the point that she was having difficulty breathing.  She went to the urgent care where she was found to be hypoxic did not improve with the breathing treatment therefore directed her to the ER.  Patient reported subjective fever, chills, nausea and overall not feeling well for last few weeks.  Denies headache, dizziness, abdomen pain, dysuria urgency, diarrhea or constipation.  Patient reports he is chest discomfort with coughing.  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  She retired in 2005 when she had knee surgery.  She lives with her husband.  Recently moved to [place] with the family.  Currently unemployed.  He smokes half a pack of cigarettes every day for a long time.  Interim history Day 5 of admission: Patient noted to be unresponsive and pulseless with subsequent code blue initiated. ROSC was achieved after approximately 1 minute but the patient was noted have agonal breathing. She was subsequently intubated.   Assessment & Plan   PEA arrest -Patient required CPR for approximately 1 minute and achieved RC -She was intubated and successfully extubated on 09/06/2016  Acute on chronic  systolic and diastolic heart failure/NSTEMI -Echocardiogram on 08/30/2017 showed EF of 9935%, grade 2 diastolic dysfunction -Limited Echocardiogram 1/22/2019showed an EF of 45-50%, akinesis of the basal mid inferior myocardium. Severe hypokinesis of the mid apical inferior lateral myocardium -Cardiology consulted and appreciated, plan for right and left heart catheterization with coronary angiogram- held today due to anemia, Hemoglobin 7.5 -Continue to monitor intake and output, daily weights -continue aspirin, statin  Acute hypoxic respiratory failure in the setting of pneumonia complicated by acute pulmonary edema with an element of acute lung injury/Asthma -As above, patient did require intubation, currently on nasal cannula -Continue to treat underlying conditions, nebs, singulair -Continue supplemental oxygen to maintain saturations above 70%  Acute metabolic encephalopathy -EEG with diffuse changes -Appears to be improving, patient currently alert and oriented 3 however does have tangential thinking  Fever -On 09/06/2017 -was on antibiotics however were discontinued on 09/05/2017  Acute kidney injury on chronic kidney disease, Brenda baseline-suspect stage III -Creatinine peaked to 2.38, currently 1.36 -Continue to monitor BMP  Hypokalemia  -Potassium 4.1, continue to replace  Hypernatremia -Resolved, continue to monitor BMP  Chronic anemia, microcytic -Hemoglobin down to 7.5, continue to monitor CBC -Anemia panel: Iron 14, ferritin 87  Diabetes mellitus, type II -Hemoglobin A1c 6.7 -Continue insulin sliding scale CBG monitoring -May consider SGLT2 on discharge  Tobacco abuse -Discussed cessation  Essential hypertension -Continue amlodipine, clonidine, hydralazine  Possible underlying psychological issue/? Paranoia -No history of psychiatric diagnosis however on 09/08/2017, patient was very talkative this morning and tangential. 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 -Currently alert and oriented 3, understands where she is why she is here. -Continue to monitor closely  DVT Prophylaxis  heparin  Code Status: Full  Family Communication: None at bedside  Disposition Plan: Admitted,   Consultants PCCM Cardiology  Procedures  EEG Echocardiogram Limited echocardiogram Intubation/extubation  Antibiotics   Anti-infectives (From admission, onward)   Start     Dose/Rate Route Frequency Ordered Stop   09/03/17 1600  Ampicillin-Sulbactam (UNASYN) 3 g in sodium chloride 0.9 % 100 mL IVPB     3 g 200 mL/hr over 30 Minutes Intravenous Every 12 hours 09/03/17 1034 09/05/17 1601   09/03/17 1500  ampicillin-sulbactam (UNASYN) 1.5 g in sodium chloride 0.9 % 50 mL IVPB  Status:  Discontinued     1.5 g 100 mL/hr over 30 Minutes Intravenous Every 8 hours 09/03/17 1031 09/03/17 1034   09/03/17 1000  amoxicillin-clavulanate (AUGMENTIN) 875-125 MG per tablet 1 tablet  Status:  Discontinued     1 tablet Per Tube Every 12 hours 09/03/17 0536 09/03/17 1027   09/01/17 1315  amoxicillin-clavulanate (AUGMENTIN) 875-125 MG per tablet 1 tablet  Status:  Discontinued     1 tablet Oral Every 12 hours 09/01/17 1302 09/03/17 0536   08/29/17 1900  levofloxacin (LEVAQUIN) IVPB 500 mg  Status:  Discontinued     500 mg 100 mL/hr over 60 Minutes Intravenous Every 24 hours 08/29/17 1827 09/01/17 0809      Subjective:   Brenda Sims seen and examined today.  No complaints today. Currently getting breathing treatment. Denies current chest pain, abdominal pain, nausea vomiting, diarrhea or constipation, headache or dizziness.  Objective:   Vitals:   09/09/17 0455 09/09/17 0734 09/09/17 0810 09/09/17 0811  BP: (!) 146/70 138/79    Pulse: 79 79    Resp: 20 16    Temp: 98.2 F (36.8 C) 99.4 F (37.4 C)    TempSrc: Oral Oral    SpO2: 98% 95% 99% 100%  Weight: 82.2 kg (181 lb 3.5 oz)     Height:         Intake/Output Summary (Last 24 hours) at 09/09/2017 1033 Last data filed at 09/08/2017 1824 Gross per 24 hour  Intake 483 ml  Output -  Net 483 ml   Filed Weights   09/07/17 2002 09/08/17 0526 09/09/17 0455  Weight: 75 kg (165 lb 5.5 oz) 81.2 kg (179 lb 0.2 oz) 82.2 kg (181 lb 3.5 oz)    Exam (no change from exam on 09/08/2017)  General: Well developed, well nourished, NAD, appears stated age   HEENT: NCAT,  mucous membranes moist.   Cardiovascular: S1 S2 auscultated, no rubs, murmurs or gallops. Regular rate and rhythm. +S4  Respiratory: Clear to auscultation bilaterally with equal chest rise  Abdomen: Soft, Obese, nontender, nondistended, + bowel sounds  Extremities: warm dry without cyanosis clubbing or edema  Neuro: AAOx3,  nonfocal  Psych:  strange affect however pleasant  Data Reviewed: I have personally reviewed following labs and imaging studies  CBC: Recent Labs  Lab 09/03/17 0523  09/05/17 0349 09/06/17 0440 09/07/17 0410 09/08/17 0736 09/09/17 0600  WBC 17.6*   < > 13.0* 11.6* 17.1* 13.5* 12.3*  NEUTROABS 15.5*  --   --   --   --   --   --   HGB 7.4*   < > 8.0* 8.2* 7.8* 8.2* 7.5*  HCT 23.7*   < > 24.7* 27.0* 25.6* 26.5* 24.0*  MCV 66.6*   < >  65.2* 67.2* 67.2* 66.6* 66.7*  PLT 317   < > 387 394 364 362 336   < > = values in this interval not displayed.   Basic Metabolic Panel: Recent Labs  Lab 09/04/17 0403 09/04/17 1205 09/04/17 1556 09/05/17 0349 09/05/17 1601 09/06/17 0440 09/07/17 0410 09/08/17 0736 09/09/17 0600  NA 143  --   --  148* 152* 154* 142 141 141  K 3.8  --   --  3.0* 2.7* 3.1* 2.8* 3.3* 4.1  CL 105  --   --  107 108 110 99* 100* 105  CO2 23  --   --  24 30 30 31 26 27   GLUCOSE 162*  --   --  156* 131* 173* 149* 117* 133*  BUN 50*  --   --  76* 62* 69* 47* 35* 30*  CREATININE 2.31*  --   --  2.38* 2.06* 1.96* 1.56* 1.46* 1.36*  CALCIUM 8.3*  --   --  8.4* 8.3* 8.8* 8.8* 9.0 8.7*  MG 2.3 2.4 2.5* 2.3 2.3  --   --   --    --   PHOS 8.1* 8.9* 8.5* 6.3* 6.6*  --   --   --   --    GFR: Estimated Creatinine Clearance: 45.1 mL/min (A) (by C-G formula based on SCr of 1.36 mg/dL (H)). Liver Function Tests: Recent Labs  Lab 09/03/17 0523 09/07/17 0410  AST 58* 30  ALT 30 21  ALKPHOS 84 64  BILITOT 0.6 0.8  PROT 6.4* 6.4*  ALBUMIN 2.6* 2.6*   No results for input(s): LIPASE, AMYLASE in the last 168 hours. No results for input(s): AMMONIA in the last 168 hours. Coagulation Profile: Recent Labs  Lab 09/03/17 0523  INR 1.30   Cardiac Enzymes: Recent Labs  Lab 09/04/17 0642 09/04/17 1556 09/04/17 2136 09/05/17 0349  TROPONINI 0.14* 0.20* 0.19* 0.18*   BNP (last 3 results) No results for input(s): PROBNP in the last 8760 hours. HbA1C: No results for input(s): HGBA1C in the last 72 hours. CBG: Recent Labs  Lab 09/08/17 1651 09/08/17 2146 09/09/17 0025 09/09/17 0500 09/09/17 0731  GLUCAP 203* 188* 124* 141* 123*   Lipid Profile: No results for input(s): CHOL, HDL, LDLCALC, TRIG, CHOLHDL, LDLDIRECT in the last 72 hours. Thyroid Function Tests: No results for input(s): TSH, T4TOTAL, FREET4, T3FREE, THYROIDAB in the last 72 hours. Anemia Panel: No results for input(s): VITAMINB12, FOLATE, FERRITIN, TIBC, IRON, RETICCTPCT in the last 72 hours. Urine analysis:    Component Value Date/Time   COLORURINE YELLOW 08/30/2017 0159   APPEARANCEUR CLEAR 08/30/2017 0159   LABSPEC 1.010 08/30/2017 0159   PHURINE 5.0 08/30/2017 0159   GLUCOSEU 50 (A) 08/30/2017 0159   HGBUR NEGATIVE 08/30/2017 0159   BILIRUBINUR NEGATIVE 08/30/2017 0159   KETONESUR NEGATIVE 08/30/2017 0159   PROTEINUR 100 (A) 08/30/2017 0159   NITRITE NEGATIVE 08/30/2017 0159   LEUKOCYTESUR NEGATIVE 08/30/2017 0159   Sepsis Labs: @LABRCNTIP (procalcitonin:4,lacticidven:4)  ) Recent Results (from the past 240 hour(s))  Respiratory Panel by PCR     Status: None   Collection Time: 09/01/17 11:10 AM  Result Value Ref Range Status    Adenovirus NOT DETECTED NOT DETECTED Final   Coronavirus 229E NOT DETECTED NOT DETECTED Final   Coronavirus HKU1 NOT DETECTED NOT DETECTED Final   Coronavirus NL63 NOT DETECTED NOT DETECTED Final   Coronavirus OC43 NOT DETECTED NOT DETECTED Final   Metapneumovirus NOT DETECTED NOT DETECTED Final   Rhinovirus / Enterovirus NOT DETECTED  NOT DETECTED Final   Influenza A NOT DETECTED NOT DETECTED Final   Influenza B NOT DETECTED NOT DETECTED Final   Parainfluenza Virus 1 NOT DETECTED NOT DETECTED Final   Parainfluenza Virus 2 NOT DETECTED NOT DETECTED Final   Parainfluenza Virus 3 NOT DETECTED NOT DETECTED Final   Parainfluenza Virus 4 NOT DETECTED NOT DETECTED Final   Respiratory Syncytial Virus NOT DETECTED NOT DETECTED Final   Bordetella pertussis NOT DETECTED NOT DETECTED Final   Chlamydophila pneumoniae NOT DETECTED NOT DETECTED Final   Mycoplasma pneumoniae NOT DETECTED NOT DETECTED Final  MRSA PCR Screening     Status: None   Collection Time: 09/03/17  4:42 AM  Result Value Ref Range Status   MRSA by PCR NEGATIVE NEGATIVE Final    Comment:        The GeneXpert MRSA Assay (FDA approved for NASAL specimens only), is one component of a comprehensive MRSA colonization surveillance program. It is not intended to diagnose MRSA infection nor to guide or monitor treatment for MRSA infections.   Culture, respiratory (NON-Expectorated)     Status: None   Collection Time: 09/06/17 10:47 AM  Result Value Ref Range Status   Specimen Description TRACHEAL ASPIRATE  Final   Special Requests NONE  Final   Gram Stain   Final    MODERATE WBC PRESENT, PREDOMINANTLY MONONUCLEAR RARE SQUAMOUS EPITHELIAL CELLS PRESENT NO ORGANISMS SEEN    Culture FEW CANDIDA ALBICANS  Final   Report Status 09/08/2017 FINAL  Final      Radiology Studies: No results found.   Scheduled Meds: . amLODipine  5 mg Oral Daily  . aspirin  81 mg Oral Daily  . atorvastatin  40 mg Oral q1800  . budesonide  (PULMICORT) nebulizer solution  0.25 mg Nebulization BID  . carvedilol  6.25 mg Oral BID WC  . Chlorhexidine Gluconate Cloth  6 each Topical Daily  . cloNIDine  0.2 mg Oral Q8H  . gabapentin  100 mg Oral TID  . heparin injection (subcutaneous)  5,000 Units Subcutaneous Q8H  . hydrALAZINE  100 mg Oral Q8H  . insulin aspart  0-15 Units Subcutaneous Q4H  . ipratropium-albuterol  3 mL Nebulization BID  . loratadine  10 mg Oral Daily  . montelukast  10 mg Oral QHS  . nitroGLYCERIN  0.2 mg Transdermal Q24H  . polyethylene glycol  17 g Oral Daily  . sodium chloride flush  10-40 mL Intracatheter Q12H  . sodium chloride flush  3 mL Intravenous Q12H  . zolpidem  5 mg Oral Once   Continuous Infusions: . sodium chloride Stopped (09/04/17 1741)  . sodium chloride    . sodium chloride 1 mL/kg/hr (09/09/17 6294)     LOS: 11 days   Time Spent in minutes   30 minutes  Larra Crunkleton D.O. on 09/09/2017 at 10:33 AM  Between 7am to 7pm - Pager - [phone]  After 7pm go to [email or web address] - password [email or web address]  And look for the night coverage person covering for me after hours  [facility] Office  [phone]

## 2017-09-09 NOTE — Progress Notes (Signed)
Progress Note  Patient Name: Brenda Sims Date of Encounter: 09/09/2017  Primary Cardiologist: Tamala Julian  Subjective   No chest pain or dyspnea  Inpatient Medications    Scheduled Meds: . amLODipine  5 mg Oral Daily  . aspirin  81 mg Oral Daily  . atorvastatin  40 mg Oral q1800  . budesonide (PULMICORT) nebulizer solution  0.25 mg Nebulization BID  . carvedilol  6.25 mg Oral BID WC  . Chlorhexidine Gluconate Cloth  6 each Topical Daily  . cloNIDine  0.2 mg Oral Q8H  . gabapentin  100 mg Oral TID  . heparin injection (subcutaneous)  5,000 Units Subcutaneous Q8H  . hydrALAZINE  100 mg Oral Q8H  . insulin aspart  0-15 Units Subcutaneous Q4H  . ipratropium-albuterol  3 mL Nebulization BID  . loratadine  10 mg Oral Daily  . montelukast  10 mg Oral QHS  . nitroGLYCERIN  0.2 mg Transdermal Q24H  . polyethylene glycol  17 g Oral Daily  . sodium chloride flush  10-40 mL Intracatheter Q12H  . sodium chloride flush  3 mL Intravenous Q12H  . zolpidem  5 mg Oral Once   Continuous Infusions: . sodium chloride Stopped (09/04/17 1741)  . sodium chloride    . sodium chloride 1 mL/kg/hr (09/09/17 0648)   PRN Meds: sodium chloride, acetaminophen (TYLENOL) oral liquid 160 mg/5 mL, albuterol, bisacodyl, docusate, hydrALAZINE, sodium chloride flush, sodium chloride flush, temazepam   Vital Signs    Vitals:   09/09/17 0455 09/09/17 0734 09/09/17 0810 09/09/17 0811  BP: (!) 146/70 138/79    Pulse: 79 79    Resp: 20 16    Temp: 98.2 F (36.8 C) 99.4 F (37.4 C)    TempSrc: Oral Oral    SpO2: 98% 95% 99% 100%  Weight: 181 lb 3.5 oz (82.2 kg)     Height:        Intake/Output Summary (Last 24 hours) at 09/09/2017 0835 Last data filed at 09/08/2017 1824 Gross per 24 hour  Intake 973 ml  Output -  Net 973 ml   Filed Weights   09/07/17 2002 09/08/17 0526 09/09/17 0455  Weight: 165 lb 5.5 oz (75 kg) 179 lb 0.2 oz (81.2 kg) 181 lb 3.5 oz (82.2 kg)    Telemetry    Sinus - Personally  Reviewed  ECG    No AM EKG  Physical Exam   General: Well developed, well nourished, NAD. Oriented to time, person and place.  HEENT: OP clear, mucus membranes moist  SKIN: warm, dry. No rashes. Neuro: No focal deficits  Musculoskeletal: Muscle strength 5/5 all ext  Psychiatric: Mood and affect normal  Neck: No JVD, no carotid bruits, no thyromegaly, no lymphadenopathy.  Lungs:Clear bilaterally, no wheezes, rhonci, crackles Cardiovascular: Regular rate and rhythm. No murmurs, gallops or rubs. Abdomen:Soft. Bowel sounds present. Non-tender.  Extremities: No lower extremity edema. Pulses are 2 + in the bilateral DP/PT.   Labs    Chemistry Recent Labs  Lab 09/03/17 0523  09/07/17 0410 09/08/17 0736 09/09/17 0600  NA 141   < > 142 141 141  K 6.5*   < > 2.8* 3.3* 4.1  CL 107   < > 99* 100* 105  CO2 23   < > 31 26 27   GLUCOSE 156*   < > 149* 117* 133*  BUN 37*   < > 47* 35* 30*  CREATININE 2.07*   < > 1.56* 1.46* 1.36*  CALCIUM 8.5*   < > 8.8*  9.0 8.7*  PROT 6.4*  --  6.4*  --   --   ALBUMIN 2.6*  --  2.6*  --   --   AST 58*  --  30  --   --   ALT 30  --  21  --   --   ALKPHOS 84  --  64  --   --   BILITOT 0.6  --  0.8  --   --   GFRNONAA 25*   < > 35* 38* 41*  GFRAA 29*   < > 40* 44* 48*  ANIONGAP 11   < > 12 15 9    < > = values in this interval not displayed.     Hematology Recent Labs  Lab 09/07/17 0410 09/08/17 0736 09/09/17 0600  WBC 17.1* 13.5* 12.3*  RBC 3.81* 3.98 3.60*  HGB 7.8* 8.2* 7.5*  HCT 25.6* 26.5* 24.0*  MCV 67.2* 66.6* 66.7*  MCH 20.5* 20.6* 20.8*  MCHC 30.5 30.9 31.3  RDW 21.7* 21.7* 22.6*  PLT 364 362 336    Cardiac Enzymes Recent Labs  Lab 09/04/17 0642 09/04/17 1556 09/04/17 2136 09/05/17 0349  TROPONINI 0.14* 0.20* 0.19* 0.18*   No results for input(s): TROPIPOC in the last 168 hours.   BNPNo results for input(s): BNP, PROBNP in the last 168 hours.   DDimer No results for input(s): DDIMER in the last 168 hours.    Radiology    No results found.  Cardiac Studies   Echocardiography, 09/03/2016: Study Conclusions  - Left ventricle: The cavity size was mildly dilated. There was mild concentric hypertrophy. Systolic function was mildly reduced. The estimated ejection fraction was in the range of 45% to 50%. There is akinesis of the basal-midinferior myocardium. There is severe hypokinesis of the mid-apicalinferolateral myocardium. - Aortic valve: Mildly to moderately calcified annulus. Trileaflet; mildly thickened, mildly calcified leaflets. - Mitral valve: Calcified annulus. Mild focal calcification of the anterior leaflet. There was trivial regurgitation. - Left atrium: The atrium was mildly dilated. - Tricuspid valve: There was trivial regurgitation. - Impressions: mildly reduced LVF with EF 45-50% with basal inferior AK and severe mid to apical inferolateral HK, mildly calcified anterior MV leaflet with mild MR, moderately calcified AV annulus with mild AVSC, mild LAE, trivial TR. Marland KitchenCompared to study 08/30/2017, the inferolateral wall is now severely hypokinetic and LVF appears to have declined some.  Impressions:  - mildly reduced LVF with EF 45-50% with basal inferior AK and severe mid to apical inferolateral HK, mildly calcified anterior MV leaflet with mild MR, moderately calcified AV annulus with mild AVSC, mild LAE, trivial TR. Marland KitchenCompared to study 08/30/2017, the inferolateral wall is now severely hypokinetic and LVF appears to have declined some.  Patient Profile     62 y.o. female with PMH of diastolic HF, IDDM, CKD, and HTN who presented on 08/29/17 with hypoxia, PNA/CHF.Had a PEA arrest on 09/03/17, was intubated and transferred to the ICU. Extubated 01/25/19According to common-law husband, only prior cardiac issue was hypertension.  Assessment & Plan    1. PEA/bradycardia asystolic cardiac arrest: This was a brief event without focal  neurological sequelae. Her arrest is not felt to be ischemia driven but there is a high probability of underlying CAD. Cardiac cath has been discussed by Dr. Recardo Evangelist but given her worsening anemia today, will not be able to perform cath today. Will follow H/H tomorrow and if improved, can consider cath. I will place her on the schedule for tomorrow. I will let  her eat today. Since there is a low suspicion for ischemic etiology of her arrest but high likelihood of CAD based on her echo findings and risk factors, I think coronary imaging is indicated before discharge. If she remains anemic, may need to consider Coronary CTA.   2. Cardiomyopathy/Acute on chronic combined systolic and diastolic heart failure:Volume status is ok today.   3. Acute renal failure: Creatinine is back at baseline.    For questions or updates, please contact [place] Please consult [email or web address] for contact info under Cardiology/STEMI.      Signed, Lauree Chandler, MD  09/09/2017, 8:35 AM

## 2017-09-09 NOTE — Progress Notes (Signed)
Physical Therapy Treatment Patient Details Name: Brenda Sims MRN: [id number] DOB: 16-Mar-1956 Today's Date: 09/09/2017    History of Present Illness Pt is a 62 y.o. female admitted  08/29/17 with acute respiratory failure with hypoxia in the setting of CHF; may have left lower lung CAP. Initial presentation with hypoxic respiratory failure due to pulmonary edema (+/- pneumonia) on 01/17, had sudden decompensation with brief PEA arrest on 1/22 and was intubated. Extubated on 09/06/2017. PMH includes HTN, DM.    PT Comments    Pt improved over last session.  Pt maintaining sats in the lower to mid 90's, steady on the RW for 200 feet.  Animated and participative.   Follow Up Recommendations  Home health PT     Equipment Recommendations  None recommended by PT    Recommendations for Other Services       Precautions / Restrictions Precautions Precaution Comments: watch SPO2    Mobility  Bed Mobility Overal bed mobility: Needs Assistance Bed Mobility: Supine to Sit     Supine to sit: Supervision     General bed mobility comments: smooth transition today  Transfers Overall transfer level: Needs assistance   Transfers: Sit to/from Stand Sit to Stand: Supervision            Ambulation/Gait Ambulation/Gait assistance: Min guard Ambulation Distance (Feet): 200 Feet   Gait Pattern/deviations: Step-through pattern   Gait velocity interpretation: at or above normal speed for age/gender General Gait Details: generally steady, much improved mentation and mobility   Stairs            Wheelchair Mobility    Modified Rankin (Stroke Patients Only)       Balance Overall balance assessment: Needs assistance   Sitting balance-Leahy Scale: Good     Standing balance support: During functional activity Standing balance-Leahy Scale: Fair                              Cognition Arousal/Alertness: Awake/alert   Overall Cognitive Status: Within  Functional Limits for tasks assessed(not formally tested)                                        Exercises      General Comments General comments (skin integrity, edema, etc.): vss stable overall      Pertinent Vitals/Pain Pain Assessment: No/denies pain    Home Living                      Prior Function            PT Goals (current goals can now be found in the care plan section) Acute Rehab PT Goals PT Goal Formulation: With patient Progress towards PT goals: Progressing toward goals    Frequency    Min 3X/week      PT Plan Current plan remains appropriate    Co-evaluation              AM-PAC PT "6 Clicks" Daily Activity  Outcome Measure  Difficulty turning over in bed (including adjusting bedclothes, sheets and blankets)?: None Difficulty moving from lying on back to sitting on the side of the bed? : None Difficulty sitting down on and standing up from a chair with arms (e.g., wheelchair, bedside commode, etc,.)?: None Help needed moving to and from a bed to chair (including a  wheelchair)?: A Little Help needed walking in hospital room?: A Little Help needed climbing 3-5 steps with a railing? : A Little 6 Click Score: 21    End of Session   Activity Tolerance: Patient tolerated treatment well Patient left: in chair;with call bell/phone within reach;with chair alarm set Nurse Communication: Mobility status PT Visit Diagnosis: Other abnormalities of gait and mobility (R26.89)     Time: 8887-5797 PT Time Calculation (min) (ACUTE ONLY): 20 min  Charges:  $Gait Training: 8-22 mins                    G Codes:       09-22-2017  Donnella Sham, PT [phone]  (pager)   Tessie Fass [place] Kadlec Sep 22, 2017, 5:05 PM

## 2017-09-10 LAB — GLUCOSE, CAPILLARY
GLUCOSE-CAPILLARY: 127 mg/dL — AB (ref 65–99)
GLUCOSE-CAPILLARY: 137 mg/dL — AB (ref 65–99)
GLUCOSE-CAPILLARY: 150 mg/dL — AB (ref 65–99)
GLUCOSE-CAPILLARY: 150 mg/dL — AB (ref 65–99)
GLUCOSE-CAPILLARY: 172 mg/dL — AB (ref 65–99)
Glucose-Capillary: 143 mg/dL — ABNORMAL HIGH (ref 65–99)

## 2017-09-10 LAB — BASIC METABOLIC PANEL
ANION GAP: 11 (ref 5–15)
BUN: 29 mg/dL — ABNORMAL HIGH (ref 6–20)
CALCIUM: 8.6 mg/dL — AB (ref 8.9–10.3)
CO2: 24 mmol/L (ref 22–32)
CREATININE: 1.6 mg/dL — AB (ref 0.44–1.00)
Chloride: 104 mmol/L (ref 101–111)
GFR, EST AFRICAN AMERICAN: 39 mL/min — AB (ref >60)
GFR, EST NON AFRICAN AMERICAN: 34 mL/min — AB (ref >60)
Glucose, Bld: 156 mg/dL — ABNORMAL HIGH (ref 65–99)
Potassium: 4.2 mmol/L (ref 3.5–5.1)
Sodium: 139 mmol/L (ref 135–145)

## 2017-09-10 LAB — CBC
HEMATOCRIT: 23.7 % — AB (ref 36.0–46.0)
Hemoglobin: 7.5 g/dL — ABNORMAL LOW (ref 12.0–15.0)
MCH: 21.2 pg — ABNORMAL LOW (ref 26.0–34.0)
MCHC: 31.6 g/dL (ref 30.0–36.0)
MCV: 66.9 fL — ABNORMAL LOW (ref 78.0–100.0)
PLATELETS: 324 10*3/uL (ref 150–400)
RBC: 3.54 MIL/uL — ABNORMAL LOW (ref 3.87–5.11)
RDW: 22.8 % — AB (ref 11.5–15.5)
WBC: 12.6 10*3/uL — AB (ref 4.0–10.5)

## 2017-09-10 LAB — ABO/RH: ABO/RH(D): A POS

## 2017-09-10 LAB — PREPARE RBC (CROSSMATCH)

## 2017-09-10 MED ORDER — SODIUM CHLORIDE 0.9 % WEIGHT BASED INFUSION
1.0000 mL/kg/h | INTRAVENOUS | Status: DC
  Administered 2017-09-10 – 2017-09-11 (×2): 1 mL/kg/h via INTRAVENOUS

## 2017-09-10 MED ORDER — SODIUM CHLORIDE 0.9% FLUSH
3.0000 mL | Freq: Two times a day (BID) | INTRAVENOUS | Status: DC
  Administered 2017-09-10: 3 mL via INTRAVENOUS

## 2017-09-10 MED ORDER — SODIUM CHLORIDE 0.9 % IV SOLN: 250.0000 mL | INTRAVENOUS | Status: DC | PRN

## 2017-09-10 MED ORDER — SODIUM CHLORIDE 0.9 % IV SOLN
Freq: Once | INTRAVENOUS | Status: AC
  Administered 2017-09-10: 13:00:00 via INTRAVENOUS

## 2017-09-10 MED ORDER — SODIUM CHLORIDE 0.9% FLUSH: 3.0000 mL | INTRAVENOUS | Status: DC | PRN

## 2017-09-10 NOTE — Progress Notes (Signed)
Patient received 1 unit of PRBC. No adverse reactions post-transfusion. Tolerated well. Will continue to monitor.

## 2017-09-10 NOTE — Progress Notes (Signed)
Rehab admissions - I met with patient and her fiance.  RN tells me that a cardiac cath is planned for tomorrow and patient needs a blood transfusion today.   Patient is doing well with therapies with PT now recommending home with [facility] therapies once patient is medically ready.  Doubt patient will need an acute inpatient rehab admission.  Call me for questions.  #[phone]

## 2017-09-10 NOTE — Plan of Care (Signed)
Per report pt ambulated in Brenda Sims without difficulty, maintained O2 sats, and denied CP/SOB. Pt has continued to be without complaints at this time, awaiting lab work for possible cardiac cath 1/29

## 2017-09-10 NOTE — Progress Notes (Addendum)
Progress Note  Patient Name: Brenda Sims Date of Encounter: 09/10/2017  Primary Cardiologist: Dr. Tamala Julian  Subjective   Patient reports feeling well this morning. Ambulated around the unit yesterday without issues. Denies chest pain, SOB, palpitations.   Inpatient Medications    Scheduled Meds: . amLODipine  5 mg Oral Daily  . aspirin  81 mg Oral Daily  . atorvastatin  40 mg Oral q1800  . budesonide (PULMICORT) nebulizer solution  0.25 mg Nebulization BID  . carvedilol  6.25 mg Oral BID WC  . Chlorhexidine Gluconate Cloth  6 each Topical Daily  . cloNIDine  0.2 mg Oral Q8H  . gabapentin  100 mg Oral TID  . heparin injection (subcutaneous)  5,000 Units Subcutaneous Q8H  . hydrALAZINE  100 mg Oral Q8H  . insulin aspart  0-15 Units Subcutaneous Q4H  . ipratropium-albuterol  3 mL Nebulization BID  . loratadine  10 mg Oral Daily  . montelukast  10 mg Oral QHS  . nitroGLYCERIN  0.2 mg Transdermal Q24H  . polyethylene glycol  17 g Oral Daily  . sodium chloride flush  10-40 mL Intracatheter Q12H  . sodium chloride flush  3 mL Intravenous Q12H  . zolpidem  5 mg Oral Once   Continuous Infusions: . sodium chloride Stopped (09/04/17 1741)  . sodium chloride    . sodium chloride 1 mL/kg/hr (09/09/17 0648)   PRN Meds: sodium chloride, acetaminophen (TYLENOL) oral liquid 160 mg/5 mL, albuterol, bisacodyl, docusate, hydrALAZINE, sodium chloride flush, sodium chloride flush, temazepam   Vital Signs    Vitals:   09/09/17 1451 09/09/17 1706 09/09/17 2041 09/10/17 0453  BP: (!) 147/79 (!) 154/95 137/78 (!) 145/66  Pulse:  78 85 79  Resp:   16 (!) 28  Temp:   99.3 F (37.4 C) 98.2 F (36.8 C)  TempSrc:   Oral Axillary  SpO2:   95% 96%  Weight:    187 lb 9.8 oz (85.1 kg)  Height:        Intake/Output Summary (Last 24 hours) at 09/10/2017 0920 Last data filed at 09/10/2017 0300 Gross per 24 hour  Intake 860 ml  Output -  Net 860 ml   Filed Weights   09/08/17 0526 09/09/17  0455 09/10/17 0453  Weight: 179 lb 0.2 oz (81.2 kg) 181 lb 3.5 oz (82.2 kg) 187 lb 9.8 oz (85.1 kg)    Telemetry    NSR with rate 70s-80s - Personally Reviewed   Physical Exam   GEN: Laying in bed in no acute distress.   Neck: No JVD, no carotid bruits Cardiac: RRR, no murmurs, rubs, or gallops.  Respiratory: Clear to auscultation bilaterally, no wheezes/ rales/ rhonchi; satting well on RA.  GI: NABS, Soft, nontender, non-distended  MS: No edema; No deformity. Neuro:  Nonfocal, moving all extremities spontaneously Psych: Pleasant  Labs    Chemistry Recent Labs  Lab 09/07/17 0410 09/08/17 0736 09/09/17 0600 09/10/17 0027  NA 142 141 141 139  K 2.8* 3.3* 4.1 4.2  CL 99* 100* 105 104  CO2 31 26 27 24   GLUCOSE 149* 117* 133* 156*  BUN 47* 35* 30* 29*  CREATININE 1.56* 1.46* 1.36* 1.60*  CALCIUM 8.8* 9.0 8.7* 8.6*  PROT 6.4*  --   --   --   ALBUMIN 2.6*  --   --   --   AST 30  --   --   --   ALT 21  --   --   --  ALKPHOS 64  --   --   --   BILITOT 0.8  --   --   --   GFRNONAA 35* 38* 41* 34*  GFRAA 40* 44* 48* 39*  ANIONGAP 12 15 9 11      Hematology Recent Labs  Lab 09/08/17 0736 09/09/17 0600 09/10/17 0027  WBC 13.5* 12.3* 12.6*  RBC 3.98 3.60* 3.54*  HGB 8.2* 7.5* 7.5*  HCT 26.5* 24.0* 23.7*  MCV 66.6* 66.7* 66.9*  MCH 20.6* 20.8* 21.2*  MCHC 30.9 31.3 31.6  RDW 21.7* 22.6* 22.8*  PLT 362 336 324    Cardiac Enzymes Recent Labs  Lab 09/04/17 0642 09/04/17 1556 09/04/17 2136 09/05/17 0349  TROPONINI 0.14* 0.20* 0.19* 0.18*   No results for input(s): TROPIPOC in the last 168 hours.   BNPNo results for input(s): BNP, PROBNP in the last 168 hours.   DDimer No results for input(s): DDIMER in the last 168 hours.   Radiology    No results found.  Cardiac Studies   Echocardiography, 09/03/2016: Study Conclusions  - Left ventricle: The cavity size was mildly dilated. There was mild concentric hypertrophy. Systolic function was  mildly reduced. The estimated ejection fraction was in the range of 45% to 50%. There is akinesis of the basal-midinferior myocardium. There is severe hypokinesis of the mid-apicalinferolateral myocardium. - Aortic valve: Mildly to moderately calcified annulus. Trileaflet; mildly thickened, mildly calcified leaflets. - Mitral valve: Calcified annulus. Mild focal calcification of the anterior leaflet. There was trivial regurgitation. - Left atrium: The atrium was mildly dilated. - Tricuspid valve: There was trivial regurgitation. - Impressions: mildly reduced LVF with EF 45-50% with basal inferior AK and severe mid to apical inferolateral HK, mildly calcified anterior MV leaflet with mild MR, moderately calcified AV annulus with mild AVSC, mild LAE, trivial TR. Marland KitchenCompared to study 08/30/2017, the inferolateral wall is now severely hypokinetic and LVF appears to have declined some.  Impressions:  - mildly reduced LVF with EF 45-50% with basal inferior AK and severe mid to apical inferolateral HK, mildly calcified anterior MV leaflet with mild MR, moderately calcified AV annulus with mild AVSC, mild LAE, trivial TR. Marland KitchenCompared to study 08/30/2017, the inferolateral wall is now severely hypokinetic and LVF appears to have declined some.   Patient Profile     62 y.o. female with PMH of diastolic HF, IDDM, CKD, and HTN who presented on 08/29/17 withhypoxia,PNA/CHF.Had a PEA arrest on 09/03/17, was intubated and transferred to the ICU. Extubated 01/25/19According to common-law husband, only prior cardiac issue was hypertension.  Assessment & Plan    1. PEA/Bradycardia systolic cardiac arrest: s/p PEA arrest 09/03/17 with CPR and RC within 1 minute. Intubated at that time for acute respiratory failure and successful extubation 09/06/17.  - Would benefit from further ischemic evaluation, however limited by anemia and CKD vs AKI (Hgb 7.5 and Cr 1.6 today).  Will continue to assess daily to determine timing of R/L heart cath. - Continue ASA, statin  2. Acute on chronic combined heart failure:  - Echo with EF 45-50% and grade 2 diastolic dysfunction; Severe hypokinesis of the mid apical inferior lateral myocardium - Diuresed well with IV lasix - net urine output -5L - Continue coreg  3. Acute renal failure vs CKD (presumably baseline stage 3) - Cr 1.6 today. Continue to monitor closely.  4. HTN: BP stable - continue amlodipine and hydralazine   5. Anemia: chronic microcytic anemia - Continue to monitor Hgb closely.   For questions or updates, please  contact [facility] Please consult [email or web address] for contact info under Cardiology/STEMI.      Signed, Abigail Butts, PA-C  09/10/2017, 9:20 AM   [phone]  Patient seen, examined. Available data reviewed. Agree with findings, assessment, and plan as outlined by Roby Lofts, PA-C.  On my exam today: Vitals:   09/10/17 0934 09/10/17 0948  BP:  (!) 149/69  Pulse:  77  Resp:    Temp:    SpO2: 96%    Pt is alert and oriented, NAD HEENT: normal Neck: JVP - normal Lungs: CTA bilaterally CV: RRR with 2/6 SEM at the LLSB Abd: soft, NT, Positive BS, no hepatomegaly Ext: no C/C/E, distal pulses intact and equal Skin: warm/dry no rash  The patient is doing well today with no chest pain or shortness of breath.  Her creatinine is up from 1.36-1.60 today.  Hemoglobin stable at 7.5.  I agree that definitive evaluation with cardiac cath is indicated.  Recommend diagnostic only limited dye cardiac catheterization via a radial approach if possible to minimize bleeding risk.  We will give gentle IV fluids starting tonight and anticipate cardiac catheterization tomorrow.  I have discussed the risks, indications, and alternatives with the patient.  She understands and agrees to proceed.  I do not see any strong indication to do a right heart catheterization.  Will set her up for a left heart  catheterization and coronary angiogram.  Sherren Mocha, M.D. 09/10/2017 10:32 AM

## 2017-09-10 NOTE — Progress Notes (Signed)
Nutrition Follow-up  DOCUMENTATION CODES:   Obesity unspecified  INTERVENTION:    Continue CHO modified diet, current intake is adequate to meet nutrition needs.  NUTRITION DIAGNOSIS:   Inadequate oral intake related to acute illness as evidenced by NPO status.  Resolved  No new nutrition diagnosis at this time.  GOAL:   Patient will meet greater than or equal to 90% of their needs  Met with intake of current PO diet.  MONITOR:   PO intake  ASSESSMENT:   62 yo admitted with acute respiratory failure in setting of acute on chronic CHF, LLL pneumonia. Pt with hx of CKD, DM, HTN  Extubated 1/25.  Diet has been advanced to CHO-modified. Patient is consuming 100% of meals.  Plans for cardiac cath tomorrow.  NUTRITION - FOCUSED PHYSICAL EXAM:    Most Recent Value  Orbital Region  No depletion  Upper Arm Region  No depletion  Thoracic and Lumbar Region  No depletion  Buccal Region  No depletion  Temple Region  No depletion  Clavicle Bone Region  No depletion  Clavicle and Acromion Bone Region  No depletion  Scapular Bone Region  No depletion  Dorsal Hand  No depletion  Patellar Region  No depletion  Anterior Thigh Region  No depletion  Posterior Calf Region  No depletion  Edema (RD Assessment)  None  Hair  Reviewed  Eyes  Reviewed  Mouth  Reviewed  Skin  Reviewed  Nails  Reviewed       Diet Order:  Diet NPO time specified Except for: Sips with Meds Diet Carb Modified Fluid consistency: Thin; Room service appropriate? Yes Diet NPO time specified Except for: Sips with Meds  EDUCATION NEEDS:   Not appropriate for education at this time  Skin:  Skin Assessment: Reviewed RN Assessment  Last BM:  1/19  Height:   Ht Readings from Last 1 Encounters:  08/29/17 _0  (1.626 m)    Weight:   Wt Readings from Last 1 Encounters:  09/10/17 187 lb 9.8 oz (85.1 kg)    Ideal Body Weight:  54.5 kg  BMI:  Body mass index is 32.2 kg/m.  Estimated  Nutritional Needs:   Kcal:  1500-1700  Protein:  80-95 gm  Fluid:  1.5-1.7 L   Molli Barrows, RD, LDN, [place] Pager [phone] After Hours Pager [phone]

## 2017-09-10 NOTE — Progress Notes (Signed)
PROGRESS NOTE    Brenda Sims  CHY:[id number] DOB: 1955-11-16 DOA: 08/29/2017 PCP: Patient, No Pcp Per   Chief Complaint  Patient presents with  . Shortness of Breath     Brief Narrative:  HPI On 08/29/2017 by Dr. Lawson Radar Brenda Sims is a 62 y.o. female with medical history significant of hypertension, type 2 diabetes on metformin and Lantus at home, obesity, mild intermittent asthma, knee surgery, presented with shortness of breath, dyspnea on exertion and coughing worsening for last to 3 days.  Patient was moved from [place] to [place] about a month ago.  Patient and her husband reported that the symptoms started before moving here.  She was having coughing and gradually worsening dyspnea on exertion.  The symptoms gradually worsened to the point that she was having difficulty breathing.  She went to the urgent care where she was found to be hypoxic did not improve with the breathing treatment therefore directed her to the ER.  Patient reported subjective fever, chills, nausea and overall not feeling well for last few weeks.  Denies headache, dizziness, abdomen pain, dysuria urgency, diarrhea or constipation.  Patient reports he is chest discomfort with coughing.  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  She retired in 2005 when she had knee surgery.  She lives with her husband.  Recently moved to [place] with the family.  Currently unemployed.  He smokes half a pack of cigarettes every day for a long time.  Interim history Day 5 of admission: Patient noted to be unresponsive and pulseless with subsequent code blue initiated. ROSC was achieved after approximately 1 minute but the patient was noted have agonal breathing. She was subsequently intubated. Extubated and [facility] assumed care on 09/08/17. Cardiology currently following. Will give 1u PRBC today.  If hemoglobin maintains and creatinine mildly improved, possible cath on  09/11/2017.  Assessment & Plan   PEA arrest -Patient required CPR for approximately 1 minute and achieved RC -She was intubated and successfully extubated on 09/06/2016  Acute on chronic systolic and diastolic heart failure/NSTEMI -Echocardiogram on 08/30/2017 showed EF of 8786%, grade 2 diastolic dysfunction -Limited Echocardiogram 1/22/2019showed an EF of 45-50%, akinesis of the basal mid inferior myocardium. Severe hypokinesis of the mid apical inferior lateral myocardium -Cardiology consulted and appreciated, plan for right and left heart catheterization with coronary angiogram- held today due to anemia, Hemoglobin 7.5 -Discussed with cardiology, holding off on CTA given Creatinine up to 1.6 -will transfuse 1uPRBC and monitor closely -Possibly cath on 09/11/2017 -Continue to monitor intake and output, daily weights -continue aspirin, statin  Acute hypoxic respiratory failure in the setting of pneumonia complicated by acute pulmonary edema with an element of acute lung injury/Asthma -As above, patient did require intubation, currently on nasal cannula -Continue to treat underlying conditions, nebs, singulair -Weaned off of supplemental oxygen.  Currently maintaining oxygen saturations 96% on room air  Acute metabolic encephalopathy -EEG with diffuse changes -Appears to be improving, patient currently alert and oriented 3 however does have tangential thinking  Fever -On 09/06/2017 -was on antibiotics however were discontinued on 09/05/2017  Acute kidney injury on chronic kidney disease, unknown baseline-suspect stage III -Creatinine peaked to 2.38, currently 1.60 -Continue to monitor BMP  Hypokalemia  -Potassium 4.2, continue to monitor and replace as eneded  Hypernatremia -Resolved, continue to monitor BMP  Chronic anemia, microcytic -Hemoglobin down to 7.5 (unknown baseline)  -Will give 1uPRBC (discussed with cardiology, goal Hb >8) -continue  to monitor CBC -Anemia  panel: Iron 14, ferritin 87  Diabetes mellitus, type II -Hemoglobin A1c 6.7 -Continue insulin sliding scale CBG monitoring -May consider SGLT2 on discharge  Tobacco abuse -Discussed cessation  Essential hypertension -Continue amlodipine, clonidine, hydralazine  Possible underlying psychological issue/? Paranoia -Resolved -No history of psychiatric diagnosis however on 09/08/2017, patient was very talkative this morning and tangential. 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 -Currently alert and oriented x4  Physical deconditioning -PT consulted, initially recommended CIR.  However patient does appear to be improving.  PT now recommending home health  DVT Prophylaxis  heparin  Code Status: Full  Family Communication: Fianc at bedside  Disposition Plan: Admitted, pending possible catheterization on 09/11/2017.  Consultants PCCM Cardiology Inpatient rehab  Procedures  EEG Echocardiogram Limited echocardiogram Intubation/extubation  Antibiotics   Anti-infectives (From admission, onward)   Start     Dose/Rate Route Frequency Ordered Stop   09/03/17 1600  Ampicillin-Sulbactam (UNASYN) 3 g in sodium chloride 0.9 % 100 mL IVPB     3 g 200 mL/hr over 30 Minutes Intravenous Every 12 hours 09/03/17 1034 09/05/17 1601   09/03/17 1500  ampicillin-sulbactam (UNASYN) 1.5 g in sodium chloride 0.9 % 50 mL IVPB  Status:  Discontinued     1.5 g 100 mL/hr over 30 Minutes Intravenous Every 8 hours 09/03/17 1031 09/03/17 1034   09/03/17 1000  amoxicillin-clavulanate (AUGMENTIN) 875-125 MG per tablet 1 tablet  Status:  Discontinued     1 tablet Per Tube Every 12 hours 09/03/17 0536 09/03/17 1027   09/01/17 1315  amoxicillin-clavulanate (AUGMENTIN) 875-125 MG per tablet 1 tablet  Status:  Discontinued     1 tablet Oral Every 12 hours 09/01/17 1302 09/03/17 0536   08/29/17 1900  levofloxacin (LEVAQUIN) IVPB 500 mg  Status:  Discontinued      500 mg 100 mL/hr over 60 Minutes Intravenous Every 24 hours 08/29/17 1827 09/01/17 0809      Subjective:   Brenda Sims seen and examined today.  Patient continues to feel better with each day.  Denies current chest pain or shortness of breath.  Denies abdominal pain, nausea vomiting, diarrhea or constipation, headache or dizziness.   Objective:   Vitals:   09/10/17 0453 09/10/17 0933 09/10/17 0934 09/10/17 0948  BP: (!) 145/66   (!) 149/69  Pulse: 79   77  Resp: (!) 28     Temp: 98.2 F (36.8 C)     TempSrc: Axillary     SpO2: 96% 94% 96%   Weight: 85.1 kg (187 lb 9.8 oz)     Height:        Intake/Output Summary (Last 24 hours) at 09/10/2017 1022 Last data filed at 09/10/2017 0300 Gross per 24 hour  Intake 860 ml  Output -  Net 860 ml   Filed Weights   09/08/17 0526 09/09/17 0455 09/10/17 0453  Weight: 81.2 kg (179 lb 0.2 oz) 82.2 kg (181 lb 3.5 oz) 85.1 kg (187 lb 9.8 oz)   Exam  General: Well developed, well nourished, NAD, appears stated age  HEENT: NCAT, mucous membranes moist.   Cardiovascular: S1 S2 auscultated, RRR, no murmur  Respiratory: Clear to auscultation bilaterally with equal chest rise  Abdomen: Soft, obese, nontender, nondistended, + bowel sounds  Extremities: warm dry without cyanosis clubbing or edema  Neuro: AAOx3, nonfocal  Psych: appropriate mood and affect, pleasant  Data Reviewed: I have personally reviewed following labs and imaging studies  CBC: Recent Labs  Lab 09/06/17 0440 09/07/17 0410 09/08/17 0736 09/09/17 0600 09/10/17 0027  WBC 11.6* 17.1* 13.5* 12.3* 12.6*  HGB 8.2* 7.8* 8.2* 7.5* 7.5*  HCT 27.0* 25.6* 26.5* 24.0* 23.7*  MCV 67.2* 67.2* 66.6* 66.7* 66.9*  PLT 394 364 362 336 638   Basic Metabolic Panel: Recent Labs  Lab 09/04/17 0403 09/04/17 1205 09/04/17 1556 09/05/17 0349 09/05/17 1601 09/06/17 0440 09/07/17 0410 09/08/17 0736 09/09/17 0600 09/10/17 0027  NA 143  --   --  148* 152* 154* 142 141  141 139  K 3.8  --   --  3.0* 2.7* 3.1* 2.8* 3.3* 4.1 4.2  CL 105  --   --  107 108 110 99* 100* 105 104  CO2 23  --   --  24 30 30 31 26 27 24   GLUCOSE 162*  --   --  156* 131* 173* 149* 117* 133* 156*  BUN 50*  --   --  76* 62* 69* 47* 35* 30* 29*  CREATININE 2.31*  --   --  2.38* 2.06* 1.96* 1.56* 1.46* 1.36* 1.60*  CALCIUM 8.3*  --   --  8.4* 8.3* 8.8* 8.8* 9.0 8.7* 8.6*  MG 2.3 2.4 2.5* 2.3 2.3  --   --   --   --   --   PHOS 8.1* 8.9* 8.5* 6.3* 6.6*  --   --   --   --   --    GFR: Estimated Creatinine Clearance: 39 mL/min (A) (by C-G formula based on SCr of 1.6 mg/dL (H)). Liver Function Tests: Recent Labs  Lab 09/07/17 0410  AST 30  ALT 21  ALKPHOS 64  BILITOT 0.8  PROT 6.4*  ALBUMIN 2.6*   No results for input(s): LIPASE, AMYLASE in the last 168 hours. No results for input(s): AMMONIA in the last 168 hours. Coagulation Profile: No results for input(s): INR, PROTIME in the last 168 hours. Cardiac Enzymes: Recent Labs  Lab 09/04/17 0642 09/04/17 1556 09/04/17 2136 09/05/17 0349  TROPONINI 0.14* 0.20* 0.19* 0.18*   BNP (last 3 results) No results for input(s): PROBNP in the last 8760 hours. HbA1C: No results for input(s): HGBA1C in the last 72 hours. CBG: Recent Labs  Lab 09/09/17 1612 09/09/17 2107 09/10/17 0036 09/10/17 0458 09/10/17 0736  GLUCAP 80 224* 150* 150* 127*   Lipid Profile: No results for input(s): CHOL, HDL, LDLCALC, TRIG, CHOLHDL, LDLDIRECT in the last 72 hours. Thyroid Function Tests: No results for input(s): TSH, T4TOTAL, FREET4, T3FREE, THYROIDAB in the last 72 hours. Anemia Panel: No results for input(s): VITAMINB12, FOLATE, FERRITIN, TIBC, IRON, RETICCTPCT in the last 72 hours. Urine analysis:    Component Value Date/Time   COLORURINE YELLOW 08/30/2017 0159   APPEARANCEUR CLEAR 08/30/2017 0159   LABSPEC 1.010 08/30/2017 0159   PHURINE 5.0 08/30/2017 0159   GLUCOSEU 50 (A) 08/30/2017 0159   HGBUR NEGATIVE 08/30/2017 0159    BILIRUBINUR NEGATIVE 08/30/2017 0159   KETONESUR NEGATIVE 08/30/2017 0159   PROTEINUR 100 (A) 08/30/2017 0159   NITRITE NEGATIVE 08/30/2017 0159   LEUKOCYTESUR NEGATIVE 08/30/2017 0159   Sepsis Labs: @LABRCNTIP (procalcitonin:4,lacticidven:4)  ) Recent Results (from the past 240 hour(s))  Respiratory Panel by PCR     Status: None   Collection Time: 09/01/17 11:10 AM  Result Value Ref Range Status   Adenovirus NOT DETECTED NOT DETECTED Final   Coronavirus 229E NOT DETECTED NOT DETECTED Final   Coronavirus HKU1 NOT DETECTED NOT DETECTED Final   Coronavirus  NL63 NOT DETECTED NOT DETECTED Final   Coronavirus OC43 NOT DETECTED NOT DETECTED Final   Metapneumovirus NOT DETECTED NOT DETECTED Final   Rhinovirus / Enterovirus NOT DETECTED NOT DETECTED Final   Influenza A NOT DETECTED NOT DETECTED Final   Influenza B NOT DETECTED NOT DETECTED Final   Parainfluenza Virus 1 NOT DETECTED NOT DETECTED Final   Parainfluenza Virus 2 NOT DETECTED NOT DETECTED Final   Parainfluenza Virus 3 NOT DETECTED NOT DETECTED Final   Parainfluenza Virus 4 NOT DETECTED NOT DETECTED Final   Respiratory Syncytial Virus NOT DETECTED NOT DETECTED Final   Bordetella pertussis NOT DETECTED NOT DETECTED Final   Chlamydophila pneumoniae NOT DETECTED NOT DETECTED Final   Mycoplasma pneumoniae NOT DETECTED NOT DETECTED Final  MRSA PCR Screening     Status: None   Collection Time: 09/03/17  4:42 AM  Result Value Ref Range Status   MRSA by PCR NEGATIVE NEGATIVE Final    Comment:        The GeneXpert MRSA Assay (FDA approved for NASAL specimens only), is one component of a comprehensive MRSA colonization surveillance program. It is not intended to diagnose MRSA infection nor to guide or monitor treatment for MRSA infections.   Culture, respiratory (NON-Expectorated)     Status: None   Collection Time: 09/06/17 10:47 AM  Result Value Ref Range Status   Specimen Description TRACHEAL ASPIRATE  Final   Special  Requests NONE  Final   Gram Stain   Final    MODERATE WBC PRESENT, PREDOMINANTLY MONONUCLEAR RARE SQUAMOUS EPITHELIAL CELLS PRESENT NO ORGANISMS SEEN    Culture FEW CANDIDA ALBICANS  Final   Report Status 09/08/2017 FINAL  Final      Radiology Studies: No results found.   Scheduled Meds: . amLODipine  5 mg Oral Daily  . aspirin  81 mg Oral Daily  . atorvastatin  40 mg Oral q1800  . budesonide (PULMICORT) nebulizer solution  0.25 mg Nebulization BID  . carvedilol  6.25 mg Oral BID WC  . Chlorhexidine Gluconate Cloth  6 each Topical Daily  . cloNIDine  0.2 mg Oral Q8H  . gabapentin  100 mg Oral TID  . heparin injection (subcutaneous)  5,000 Units Subcutaneous Q8H  . hydrALAZINE  100 mg Oral Q8H  . insulin aspart  0-15 Units Subcutaneous Q4H  . ipratropium-albuterol  3 mL Nebulization BID  . loratadine  10 mg Oral Daily  . montelukast  10 mg Oral QHS  . nitroGLYCERIN  0.2 mg Transdermal Q24H  . polyethylene glycol  17 g Oral Daily  . sodium chloride flush  10-40 mL Intracatheter Q12H  . sodium chloride flush  3 mL Intravenous Q12H  . zolpidem  5 mg Oral Once   Continuous Infusions: . sodium chloride Stopped (09/04/17 1741)  . sodium chloride    . sodium chloride 1 mL/kg/hr (09/09/17 4196)     LOS: 12 days   Time Spent in minutes   45 minutes  Jerritt Cardoza D.O. on 09/10/2017 at 10:22 AM  Between 7am to 7pm - Pager - [phone]  After 7pm go to [email or web address] - password [email or web address]  And look for the night coverage person covering for me after hours  [facility] Office  [phone]

## 2017-09-11 ENCOUNTER — Inpatient Hospital Stay (HOSPITAL_COMMUNITY)
Admission: EM | Disposition: A | Discharge status: Home / Self Care | Payer: Self-pay | Source: Home / Self Care | Attending: Critical Care Medicine

## 2017-09-11 DIAGNOSIS — I469 Cardiac arrest, cause unspecified: Secondary | ICD-10-CM

## 2017-09-11 HISTORY — PX: LEFT HEART CATH AND CORONARY ANGIOGRAPHY: CATH118249

## 2017-09-11 LAB — CBC
HEMATOCRIT: 28 % — AB (ref 36.0–46.0)
HEMATOCRIT: 28.5 % — AB (ref 36.0–46.0)
Hemoglobin: 8.9 g/dL — ABNORMAL LOW (ref 12.0–15.0)
Hemoglobin: 9 g/dL — ABNORMAL LOW (ref 12.0–15.0)
MCH: 21.9 pg — AB (ref 26.0–34.0)
MCH: 21.8 pg — ABNORMAL LOW (ref 26.0–34.0)
MCHC: 31.8 g/dL (ref 30.0–36.0)
MCHC: 31.6 g/dL (ref 30.0–36.0)
MCV: 69 fL — AB (ref 78.0–100.0)
MCV: 69 fL — ABNORMAL LOW (ref 78.0–100.0)
PLATELETS: 318 10*3/uL (ref 150–400)
Platelets: 339 10*3/uL (ref 150–400)
RBC: 4.06 MIL/uL (ref 3.87–5.11)
RBC: 4.13 MIL/uL (ref 3.87–5.11)
RDW: 23.5 % — AB (ref 11.5–15.5)
RDW: 23.7 % — AB (ref 11.5–15.5)
WBC: 11.9 10*3/uL — AB (ref 4.0–10.5)
WBC: 11.8 10*3/uL — AB (ref 4.0–10.5)

## 2017-09-11 LAB — BASIC METABOLIC PANEL
ANION GAP: 11 (ref 5–15)
BUN: 22 mg/dL — AB (ref 6–20)
CO2: 25 mmol/L (ref 22–32)
Calcium: 8.9 mg/dL (ref 8.9–10.3)
Chloride: 106 mmol/L (ref 101–111)
Creatinine, Ser: 1.49 mg/dL — ABNORMAL HIGH (ref 0.44–1.00)
GFR calc Af Amer: 43 mL/min — ABNORMAL LOW (ref >60)
GFR calc non Af Amer: 37 mL/min — ABNORMAL LOW (ref >60)
GLUCOSE: 134 mg/dL — AB (ref 65–99)
POTASSIUM: 3.8 mmol/L (ref 3.5–5.1)
Sodium: 142 mmol/L (ref 135–145)

## 2017-09-11 LAB — BPAM RBC
Blood Product Expiration Date: 201902042359
ISSUE DATE / TIME: 201901291306
Unit Type and Rh: 6200

## 2017-09-11 LAB — TYPE AND SCREEN
ABO/RH(D): A POS
Antibody Screen: NEGATIVE
UNIT DIVISION: 0

## 2017-09-11 LAB — GLUCOSE, CAPILLARY
GLUCOSE-CAPILLARY: 164 mg/dL — AB (ref 65–99)
GLUCOSE-CAPILLARY: 144 mg/dL — AB (ref 65–99)
Glucose-Capillary: 110 mg/dL — ABNORMAL HIGH (ref 65–99)
Glucose-Capillary: 134 mg/dL — ABNORMAL HIGH (ref 65–99)
Glucose-Capillary: 145 mg/dL — ABNORMAL HIGH (ref 65–99)
Glucose-Capillary: 153 mg/dL — ABNORMAL HIGH (ref 65–99)

## 2017-09-11 LAB — PROTIME-INR
INR: 1.16
Prothrombin Time: 14.7 seconds (ref 11.4–15.2)

## 2017-09-11 LAB — CREATININE, SERUM
CREATININE: 1.27 mg/dL — AB (ref 0.44–1.00)
GFR calc Af Amer: 52 mL/min — ABNORMAL LOW (ref >60)
GFR, EST NON AFRICAN AMERICAN: 45 mL/min — AB (ref >60)

## 2017-09-11 SURGERY — LEFT HEART CATH AND CORONARY ANGIOGRAPHY
Anesthesia: LOCAL

## 2017-09-11 MED ORDER — HEPARIN SODIUM (PORCINE) 1000 UNIT/ML IJ SOLN
INTRAMUSCULAR | Status: DC | PRN
  Administered 2017-09-11: 4000 [IU] via INTRAVENOUS

## 2017-09-11 MED ORDER — IOPAMIDOL (ISOVUE-370) INJECTION 76%
INTRAVENOUS | Status: AC
  Filled 2017-09-11: qty 100

## 2017-09-11 MED ORDER — HEPARIN SODIUM (PORCINE) 5000 UNIT/ML IJ SOLN
5000.0000 [IU] | Freq: Three times a day (TID) | INTRAMUSCULAR | Status: DC
  Administered 2017-09-11 – 2017-09-12 (×2): 5000 [IU] via SUBCUTANEOUS
  Filled 2017-09-11 (×2): qty 1

## 2017-09-11 MED ORDER — SODIUM CHLORIDE 0.9% FLUSH: 3.0000 mL | INTRAVENOUS | Status: DC | PRN

## 2017-09-11 MED ORDER — MIDAZOLAM HCL 2 MG/2ML IJ SOLN
INTRAMUSCULAR | Status: DC | PRN
  Administered 2017-09-11: 1 mg

## 2017-09-11 MED ORDER — HEPARIN SODIUM (PORCINE) 1000 UNIT/ML IJ SOLN
INTRAMUSCULAR | Status: AC
  Filled 2017-09-11: qty 1

## 2017-09-11 MED ORDER — MIDAZOLAM HCL 2 MG/2ML IJ SOLN
INTRAMUSCULAR | Status: AC
  Filled 2017-09-11: qty 2

## 2017-09-11 MED ORDER — IOPAMIDOL (ISOVUE-370) INJECTION 76%
INTRAVENOUS | Status: DC | PRN
  Administered 2017-09-11: 75 mL via INTRA_ARTERIAL

## 2017-09-11 MED ORDER — VERAPAMIL HCL 2.5 MG/ML IV SOLN
INTRAVENOUS | Status: DC | PRN
  Administered 2017-09-11: 15:00:00 via INTRA_ARTERIAL

## 2017-09-11 MED ORDER — LIDOCAINE HCL (PF) 1 % IJ SOLN
INTRAMUSCULAR | Status: DC | PRN
  Administered 2017-09-11: 5 mL

## 2017-09-11 MED ORDER — VERAPAMIL HCL 2.5 MG/ML IV SOLN
INTRAVENOUS | Status: AC
  Filled 2017-09-11: qty 2

## 2017-09-11 MED ORDER — HEPARIN (PORCINE) IN NACL 2-0.9 UNIT/ML-% IJ SOLN
INTRAMUSCULAR | Status: AC
  Filled 2017-09-11: qty 1000

## 2017-09-11 MED ORDER — SODIUM CHLORIDE 0.9% FLUSH
3.0000 mL | Freq: Two times a day (BID) | INTRAVENOUS | Status: DC
  Administered 2017-09-11 (×2): 3 mL via INTRAVENOUS

## 2017-09-11 MED ORDER — ASPIRIN 81 MG PO CHEW
CHEWABLE_TABLET | ORAL | Status: AC
  Administered 2017-09-11: 81 mg via ORAL
  Filled 2017-09-11: qty 1

## 2017-09-11 MED ORDER — SODIUM CHLORIDE 0.9 % IV SOLN: 250.0000 mL | INTRAVENOUS | Status: DC | PRN

## 2017-09-11 MED ORDER — HEPARIN (PORCINE) IN NACL 2-0.9 UNIT/ML-% IJ SOLN
INTRAMUSCULAR | Status: AC | PRN
  Administered 2017-09-11: 1000 mL

## 2017-09-11 MED ORDER — LIDOCAINE HCL 1 % IJ SOLN
INTRAMUSCULAR | Status: AC
  Filled 2017-09-11: qty 20

## 2017-09-11 SURGICAL SUPPLY — 10 items

## 2017-09-11 NOTE — Progress Notes (Signed)
Progress Note  Patient Name: Brenda Sims Date of Encounter: 09/11/2017  Primary Cardiologist: Tamala Julian  Subjective   Feeling well today. Plans for cath.   Inpatient Medications    Scheduled Meds: . amLODipine  5 mg Oral Daily  . aspirin  81 mg Oral Daily  . atorvastatin  40 mg Oral q1800  . budesonide (PULMICORT) nebulizer solution  0.25 mg Nebulization BID  . carvedilol  6.25 mg Oral BID WC  . Chlorhexidine Gluconate Cloth  6 each Topical Daily  . cloNIDine  0.2 mg Oral Q8H  . gabapentin  100 mg Oral TID  . heparin injection (subcutaneous)  5,000 Units Subcutaneous Q8H  . hydrALAZINE  100 mg Oral Q8H  . insulin aspart  0-15 Units Subcutaneous Q4H  . loratadine  10 mg Oral Daily  . montelukast  10 mg Oral QHS  . nitroGLYCERIN  0.2 mg Transdermal Q24H  . polyethylene glycol  17 g Oral Daily  . sodium chloride flush  10-40 mL Intracatheter Q12H  . sodium chloride flush  3 mL Intravenous Q12H  . sodium chloride flush  3 mL Intravenous Q12H  . zolpidem  5 mg Oral Once   Continuous Infusions: . sodium chloride Stopped (09/04/17 1741)  . sodium chloride    . sodium chloride    . sodium chloride 1 mL/kg/hr (09/09/17 3244)  . sodium chloride 1 mL/kg/hr (09/11/17 0102)   PRN Meds: sodium chloride, sodium chloride, acetaminophen (TYLENOL) oral liquid 160 mg/5 mL, albuterol, bisacodyl, docusate, hydrALAZINE, sodium chloride flush, sodium chloride flush, sodium chloride flush, temazepam   Vital Signs    Vitals:   09/11/17 0445 09/11/17 0500 09/11/17 0816 09/11/17 0819  BP: (!) 160/70     Pulse: 79     Resp: (!) 24     Temp: 98.2 F (36.8 C)     TempSrc: Axillary     SpO2: 93%  98% 98%  Weight:  187 lb 9.6 oz (85.1 kg)    Height:        Intake/Output Summary (Last 24 hours) at 09/11/2017 1019 Last data filed at 09/11/2017 0500 Gross per 24 hour  Intake 0 ml  Output 800 ml  Net -800 ml   Filed Weights   09/09/17 0455 09/10/17 0453 09/11/17 0500  Weight: 181 lb 3.5  oz (82.2 kg) 187 lb 9.8 oz (85.1 kg) 187 lb 9.6 oz (85.1 kg)    Telemetry    SR - Personally Reviewed  Physical Exam   General: Well developed, well nourished, AA female appearing in no acute distress. Head: Normocephalic, atraumatic.  Neck: Supple without bruits, JVD. Lungs:  Resp regular and unlabored, CTA. Heart: RRR, S1, S2, no S3, S4, or murmur; no rub. Abdomen: Soft, non-tender, non-distended with normoactive bowel sounds.  Extremities: No clubbing, cyanosis, edema. Distal pedal pulses are 2+ bilaterally. Neuro: Alert and oriented X 3. Moves all extremities spontaneously. Psych: Normal affect.  Labs    Chemistry Recent Labs  Lab 09/07/17 0410  09/09/17 0600 09/10/17 0027 09/11/17 0347  NA 142   < > 141 139 142  K 2.8*   < > 4.1 4.2 3.8  CL 99*   < > 105 104 106  CO2 31   < > 27 24 25   GLUCOSE 149*   < > 133* 156* 134*  BUN 47*   < > 30* 29* 22*  CREATININE 1.56*   < > 1.36* 1.60* 1.49*  CALCIUM 8.8*   < > 8.7* 8.6* 8.9  PROT  6.4*  --   --   --   --   ALBUMIN 2.6*  --   --   --   --   AST 30  --   --   --   --   ALT 21  --   --   --   --   ALKPHOS 64  --   --   --   --   BILITOT 0.8  --   --   --   --   GFRNONAA 35*   < > 41* 34* 37*  GFRAA 40*   < > 48* 39* 43*  ANIONGAP 12   < > 9 11 11    < > = values in this interval not displayed.     Hematology Recent Labs  Lab 09/09/17 0600 09/10/17 0027 09/11/17 0347  WBC 12.3* 12.6* 11.9*  RBC 3.60* 3.54* 4.06  HGB 7.5* 7.5* 8.9*  HCT 24.0* 23.7* 28.0*  MCV 66.7* 66.9* 69.0*  MCH 20.8* 21.2* 21.9*  MCHC 31.3 31.6 31.8  RDW 22.6* 22.8* 23.5*  PLT 336 324 339    Cardiac Enzymes Recent Labs  Lab 09/04/17 1556 09/04/17 2136 09/05/17 0349  TROPONINI 0.20* 0.19* 0.18*   No results for input(s): TROPIPOC in the last 168 hours.   BNPNo results for input(s): BNP, PROBNP in the last 168 hours.   DDimer No results for input(s): DDIMER in the last 168 hours.    Radiology    No results found.  Cardiac  Studies   Echocardiography, 09/03/2017:  Study Conclusions  - Left ventricle: The cavity size was mildly dilated. There was mild concentric hypertrophy. Systolic function was mildly reduced. The estimated ejection fraction was in the range of 45% to 50%. There is akinesis of the basal-midinferior myocardium. There is severe hypokinesis of the mid-apicalinferolateral myocardium. - Aortic valve: Mildly to moderately calcified annulus. Trileaflet; mildly thickened, mildly calcified leaflets. - Mitral valve: Calcified annulus. Mild focal calcification of the anterior leaflet. There was trivial regurgitation. - Left atrium: The atrium was mildly dilated. - Tricuspid valve: There was trivial regurgitation. - Impressions: mildly reduced LVF with EF 45-50% with basal inferior AK and severe mid to apical inferolateral HK, mildly calcified anterior MV leaflet with mild MR, moderately calcified AV annulus with mild AVSC, mild LAE, trivial TR. Marland KitchenCompared to study 08/30/2017, the inferolateral wall is now severely hypokinetic and LVF appears to have declined some.  Impressions:  - mildly reduced LVF with EF 45-50% with basal inferior AK and severe mid to apical inferolateral HK, mildly calcified anterior MV leaflet with mild MR, moderately calcified AV annulus with mild AVSC, mild LAE, trivial TR. Marland KitchenCompared to study 08/30/2017, the inferolateral wall is now severely hypokinetic and LVF appears to have declined some.  Patient Profile     62 y.o. female with PMH of diastolic HF, IDDM, CKD, and HTN who presented on 08/29/17 withhypoxia,PNA/CHF.Had a PEA arrest on 09/03/17, was intubated and transferred to the ICU. Extubated 01/25/19According to common-law husband, only prior cardiac issue was hypertension.  Assessment & Plan    1. PEA/Bradycardia systolic cardiac arrest: s/p PEA arrest 09/03/17 with CPR and RC within 1 minute. Intubated at that time for  acute respiratory failure and successful extubation 09/06/17.  - Labs improved this morning Hgb 8.9, Cr 1.4. Planned for cath.  - Continue ASA, statin  2. Acute on chronic combined heart failure:  - Echo with EF 45-50% and grade 2 diastolic dysfunction; Severe hypokinesis of the mid apical  inferior lateral myocardium - Diuresed well with IV lasix - net urine output -6L. Weight is trending up but doesn't appear overloaded on exam.  - Continue coreg  3. Acute renal failure vs CKD (presumably baseline stage 3) - Cr 1.4 today. Received IVFs yesterday. Continue to monitor closely.  4. HTN: BP stable - continue amlodipine, coreg, clonidine and hydralazine   5. Anemia: chronic microcytic anemia - s/p 1 unit PRBCs. Follow CBC  6. HL: on statin  Signed, Reino Bellis, NP  09/11/2017, 10:19 AM  Pager # [phone]   For questions or updates, please contact [place] Please consult [email or web address] for contact info under Cardiology/STEMI.  I have personally seen and examined this patient. I agree with the assessment and plan as outlined above.  Plans for cardiac cath today.   Lauree Chandler 09/11/2017 10:44 AM

## 2017-09-11 NOTE — Progress Notes (Signed)
[facility]                                                                              Patient Demographics  Brenda Sims, is a 62 y.o. female, DOB - 1955/08/24, KVQ:[id number]  Admit date - 08/29/2017   Admitting Physician Dron Tanna Furry, MD  Outpatient Primary MD for the patient is Patient, No Pcp Per  Outpatient specialists:   LOS - 13  days   Medical records reviewed and are as summarized below:    Chief Complaint  Patient presents with  . Shortness of Breath       Brief summary   HPI On 08/29/2017 by Dr. Neena Rhymes Rufusis a 62 y.o.femalewith medical history significant ofhypertension, type 2 diabetes on metformin and Lantus at home, obesity, mild intermittent asthma, knee surgery, presented with shortness of breath, dyspnea on exertion and coughing worsening for last to 3 days. Patient had moved from [place] to [place] about a month ago. Patient and her husband reported that the symptoms started before moving here. She was having coughing and gradually worsening dyspnea on exertion. The symptoms gradually worsened to the point that she was having difficulty breathing. She went to the urgent care where she was found to be hypoxic did not improve with the breathing treatment therefore directed her to the ER. Patient reported subjective fever, chills, nausea and overall not feeling well for last few weeks. Denies headache, dizziness, abdomen pain, dysuria urgency, diarrhea or constipation.    Assessment & Plan    Principal Problem:   Cardiopulmonary arrest (HCC)/PEA arrest with acute hypoxic respiratory failure -On 1/22 AM, patient was noted to be unresponsive, pulseless and code blue was called. ROSC achieved after approximately 1 minute, however patient was noticed to have respiratory failure and was subsequently intubated.  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)/ NSTEMI -2D echo 1/18 showed EF of 50-55% with grade 2 diastolic dysfunction -Limited echo 1/22 after the cardiac arrest showed EF of 45-50%, akinesis of basal mid inferior myocardium, severe hypokinesis of mid apical inferior lateral myocardium cardiology following, plan for -Right and left heart catheterization on 1/30 -Patient transfused 1 unit packed RBC on 1/29, hemoglobin improved to 8.9 today  Acute metabolic encephalopathy -EEG with diffuse background suppression but no electrographic seizures noted -Resolved, patient currently alert and oriented x3, confirmed by husband at the bedside  Acute hypoxic respiratory failure in the setting of pneumonia, acute pulmonary edema, cardiac arrest  -Patient was extubated on 1/25, currently improving, O2 sat 98% on 2 L Continue-nebs, Singulair  Essential hypertension -Currently stable, continue amlodipine, clonidine, hydralazine  Diabetes mellitus type 2 -HbA1c 6.7, continue sliding scale insulin -CBGs controlled   Chronic microcytic anemia -Anemia panel showed iron 14, ferritin 87, follow outpatient with PCP, GI workup - Transfused 1 unit packed RBCs on 1/29   Tobacco use -Counseled on smoking cessation  Generalized deconditioning -PT evaluation recommended home health  Code Status: full  DVT Prophylaxis:  Heparin Family Communication: Discussed in detail with the patient, all imaging results, lab results explained to the patient and husband at the bedside   Disposition Plan: Once cleared by cardiology  Time Spent in minutes   35 minutes  Procedures:  EEG Echocardiogram Limited echocardiogram Intubation/extubation    Consultants:   [facility] Inpatient rehab  Antimicrobials:      Medications  Scheduled Meds: . amLODipine  5 mg Oral Daily  . aspirin  81 mg Oral Daily  . atorvastatin  40 mg Oral q1800  .  budesonide (PULMICORT) nebulizer solution  0.25 mg Nebulization BID  . carvedilol  6.25 mg Oral BID WC  . Chlorhexidine Gluconate Cloth  6 each Topical Daily  . cloNIDine  0.2 mg Oral Q8H  . gabapentin  100 mg Oral TID  . heparin injection (subcutaneous)  5,000 Units Subcutaneous Q8H  . hydrALAZINE  100 mg Oral Q8H  . insulin aspart  0-15 Units Subcutaneous Q4H  . loratadine  10 mg Oral Daily  . montelukast  10 mg Oral QHS  . nitroGLYCERIN  0.2 mg Transdermal Q24H  . polyethylene glycol  17 g Oral Daily  . sodium chloride flush  10-40 mL Intracatheter Q12H  . sodium chloride flush  3 mL Intravenous Q12H  . sodium chloride flush  3 mL Intravenous Q12H  . zolpidem  5 mg Oral Once   Continuous Infusions: . sodium chloride Stopped (09/04/17 1741)  . sodium chloride    . sodium chloride    . sodium chloride 1 mL/kg/hr (09/09/17 9628)  . sodium chloride 1 mL/kg/hr (09/11/17 0621)   PRN Meds:.sodium chloride, sodium chloride, acetaminophen (TYLENOL) oral liquid 160 mg/5 mL, albuterol, bisacodyl, docusate, hydrALAZINE, sodium chloride flush, sodium chloride flush, sodium chloride flush, temazepam   Antibiotics   Anti-infectives (From admission, onward)   Start     Dose/Rate Route Frequency Ordered Stop   09/03/17 1600  Ampicillin-Sulbactam (UNASYN) 3 g in sodium chloride 0.9 % 100 mL IVPB     3 g 200 mL/hr over 30 Minutes Intravenous Every 12 hours 09/03/17 1034 09/05/17 1601   09/03/17 1500  ampicillin-sulbactam (UNASYN) 1.5 g in sodium chloride 0.9 % 50 mL IVPB  Status:  Discontinued     1.5 g 100 mL/hr over 30 Minutes Intravenous Every 8 hours 09/03/17 1031 09/03/17 1034   09/03/17 1000  amoxicillin-clavulanate (AUGMENTIN) 875-125 MG per tablet 1 tablet  Status:  Discontinued     1 tablet Per Tube Every 12 hours 09/03/17 0536 09/03/17 1027   09/01/17 1315  amoxicillin-clavulanate (AUGMENTIN) 875-125 MG per tablet 1 tablet  Status:  Discontinued     1 tablet Oral Every 12 hours  09/01/17 1302 09/03/17 0536   08/29/17 1900  levofloxacin (LEVAQUIN) IVPB 500 mg  Status:  Discontinued     500 mg 100 mL/hr over 60 Minutes Intravenous Every 24 hours 08/29/17 1827 09/01/17 0809        Subjective:   Breklyn Blomgren was seen and examined today. Patient denies dizziness, chest pain, shortness of breath, abdominal pain, N/V/D/C, new weakness, numbess, tingling. No acute events overnight.    Objective:   Vitals:   09/11/17 0445 09/11/17 0500 09/11/17 0816 09/11/17 0819  BP: (!) 160/70     Pulse: 79     Resp: (!) 24     Temp: 98.2 F (36.8 C)     TempSrc: Axillary     SpO2: 93%  98% 98%  Weight:  85.1 kg (187 lb 9.6 oz)  Height:        Intake/Output Summary (Last 24 hours) at 09/11/2017 1225 Last data filed at 09/11/2017 0500 Gross per 24 hour  Intake 0 ml  Output 800 ml  Net -800 ml     Wt Readings from Last 3 Encounters:  09/11/17 85.1 kg (187 lb 9.6 oz)     Exam  General: Alert and oriented x 3, NAD  Eyes:   HEENT:  Atraumatic, normocephalic, normal oropharynx  Cardiovascular: S1 S2 auscultated, no rubs, murmurs or gallops. Regular rate and rhythm.  Respiratory: Clear to auscultation bilaterally, no wheezing, rales or rhonchi  Gastrointestinal: Soft, nontender, nondistended, + bowel sounds  Ext: no pedal edema bilaterally  Neuro: AAOx3, Cr N's II- XII. Strength 5/5 upper and lower extremities bilaterally, speech clear  Musculoskeletal: No digital cyanosis, clubbing  Skin: No rashes  Psych: Normal affect and demeanor, alert and oriented x3    Data Reviewed:  I have personally reviewed following labs and imaging studies  Micro Results Recent Results (from the past 240 hour(s))  MRSA PCR Screening     Status: None   Collection Time: 09/03/17  4:42 AM  Result Value Ref Range Status   MRSA by PCR NEGATIVE NEGATIVE Final    Comment:        The GeneXpert MRSA Assay (FDA approved for NASAL specimens only), is one component of  a comprehensive MRSA colonization surveillance program. It is not intended to diagnose MRSA infection nor to guide or monitor treatment for MRSA infections.   Culture, respiratory (NON-Expectorated)     Status: None   Collection Time: 09/06/17 10:47 AM  Result Value Ref Range Status   Specimen Description TRACHEAL ASPIRATE  Final   Special Requests NONE  Final   Gram Stain   Final    MODERATE WBC PRESENT, PREDOMINANTLY MONONUCLEAR RARE SQUAMOUS EPITHELIAL CELLS PRESENT NO ORGANISMS SEEN    Culture FEW CANDIDA ALBICANS  Final   Report Status 09/08/2017 FINAL  Final    Radiology Reports Dg Chest 2 View  Result Date: 08/29/2017 CLINICAL DATA:  Cough and body ache EXAM: CHEST  2 VIEW COMPARISON:  None. FINDINGS: No large effusion. Cardiomegaly with vascular congestion. Diffuse interstitial and mild alveolar opacity with more confluent opacity at the right base. Aortic atherosclerosis. No pneumothorax. IMPRESSION: 1. Cardiomegaly with vascular congestion. Diffuse interstitial opacities suggests underlying edema. 2. More confluent opacity at the right greater than left lung base could reflect superimposed pneumonia Electronically Signed   By: Donavan Foil M.D.   On: 08/29/2017 15:52   Dg Abd 1 View  Result Date: 09/03/2017 CLINICAL DATA:  Feeding tube placement EXAM: ABDOMEN - 1 VIEW COMPARISON:  None. FINDINGS: Nasogastric tube tip and side-port in the stomach. Reticular opacities throughout the visualized lung bases. IMPRESSION: Enteric tube tip and side-port in the stomach. Electronically Signed   By: Ulyses Jarred M.D.   On: 09/03/2017 05:09   Ct Head Wo Contrast  Result Date: 09/03/2017 CLINICAL DATA:  Altered mental status. EXAM: CT HEAD WITHOUT CONTRAST TECHNIQUE: Contiguous axial images were obtained from the base of the skull through the vertex without intravenous contrast. COMPARISON:  None. FINDINGS: Brain: No evidence of acute infarction, hemorrhage, hydrocephalus, extra-axial  collection or mass lesion/mass effect. Mild basal ganglia calcifications. Vascular: No hyperdense vessel or unexpected calcification. Skull: Normal. Negative for fracture or focal lesion. Sinuses/Orbits: Paranasal sinuses are well developed and well aerated as there is subtle opacification over the left anterior ethmoid air cells. There is moderate opacification  over the right mastoid air cells. Other: None. IMPRESSION: No acute findings. Opacification over the right mastoid sinus and minimal chronic inflammatory change of the left ethmoid sinus. Electronically Signed   By: Marin Olp M.D.   On: 09/03/2017 12:08   Ct Chest Wo Contrast  Result Date: 09/01/2017 CLINICAL DATA:  Pt has SOB and a very bad cough. She is on 3 lt of oxygen and could still not breath well EXAM: CT CHEST WITHOUT CONTRAST TECHNIQUE: Multidetector CT imaging of the chest was performed following the standard protocol without IV contrast. COMPARISON:  Chest radiograph 08/31/2017 FINDINGS: Cardiovascular: Coronary artery calcification and aortic atherosclerotic calcification. Mediastinum/Nodes: No axillary or supraclavicular adenopathy. Enlarged mediastinal lymph nodes. RIGHT lower paratracheal lymph node measures 14 mm. Subcarinal lymph node measures 23 mm. Lungs/Pleura: Extensive ground-glass opacities and mosaic pattern in the upper lobes predominantly. No pleural fluid. No pneumothorax. Airways normal. Upper Abdomen: Limited view of the liver, kidneys, pancreas are unremarkable. Normal adrenal glands. Musculoskeletal: No aggressive osseous lesion. IMPRESSION: 1. Severe ground-glass opacities in upper lobe mosaic pattern most consistent with pulmonary edema. 2. Mediastinal lymphadenopathy is favored reactive. Electronically Signed   By: Suzy Bouchard M.D.   On: 09/01/2017 18:53   US Renal  Result Date: 08/29/2017 CLINICAL DATA:  62 year old female with chronic kidney disease. EXAM: RENAL / URINARY TRACT ULTRASOUND COMPLETE  COMPARISON:  None. FINDINGS: Right Kidney: Length: 9.1 cm. There is diffuse increased renal echogenicity. No hydronephrosis or shadowing stone. Mild renal pelviectasis. Left Kidney: Length: 11 cm. Minimally increased echogenicity. No hydronephrosis or shadowing stone. Bladder: Appears normal for degree of bladder distention. Trace left pleural effusion noted. IMPRESSION: Mildly atrophic and echogenic right kidney. Clinical correlation is recommended. No hydronephrosis or shadowing stone. Trace left pleural effusion. Electronically Signed   By: Anner Crete M.D.   On: 08/29/2017 22:39   Dg Chest Port 1 View  Result Date: 09/06/2017 CLINICAL DATA:  Pneumonia. EXAM: PORTABLE CHEST 1 VIEW COMPARISON:  09/04/2017. FINDINGS: Endotracheal tube, NG tube, right subclavian line in stable position. Cardiomegaly with pulmonary venous congestion bilateral interstitial prominence and small bilateral pleural effusions noted. Findings consistent CHF. Findings have slightly worsened from prior exam. Low lung volumes with basilar atelectasis. No pneumothorax. IMPRESSION: Lines and tubes in stable position cardiomegaly with bilateral from interstitial prominence of small pleural effusions consistent with CHF. Findings have slightly worsened from prior exam. Low lung volumes with basilar atelectasis. Electronically Signed   By: Marcello Moores  Register   On: 09/06/2017 08:38   Dg Chest Port 1 View  Addendum Date: 09/04/2017   ADDENDUM REPORT: 09/04/2017 21:59 ADDENDUM: Request made to comment on OG tube. As stated in the findings section of the report, the esophageal tube extends below the diaphragm but the tip is not included on the current image. Electronically Signed   By: Donavan Foil M.D.   On: 09/04/2017 21:59   Result Date: 09/04/2017 CLINICAL DATA:  History of ETT EXAM: PORTABLE CHEST 1 VIEW COMPARISON:  09/04/2017, 09/03/2017, 08/29/2017 FINDINGS: Endotracheal tube tip is about 3.7 cm superior to the carina. Esophageal  tube tip is below the diaphragm but non included. Slightly improved aeration of the lung bases. Cardiomegaly with vascular congestion. Mild diffuse interstitial opacities suggesting residual edema. Probable small pleural effusions. Aortic atherosclerosis. Right-sided central venous catheter tip overlies the distal SVC. Negative for a pneumothorax. IMPRESSION: 1. Endotracheal tube tip about 3.7 cm superior to carina 2. Slightly improved aeration of the lung bases. 3. Cardiomegaly with small pleural effusions, mild  vascular congestion and diffuse interstitial edema. Electronically Signed: By: Donavan Foil M.D. On: 09/04/2017 21:27   Dg Chest Port 1 View  Result Date: 09/04/2017 CLINICAL DATA:  central line placement EXAM: PORTABLE CHEST - 1 VIEW COMPARISON:  Earlier film of the same day FINDINGS: Interval placement of right subclavian central venous catheter to the cavoatrial junction. No pneumothorax. Endotracheal tube and nasogastric tube stable in position. Diffuse bilateral edema or infiltrates may be marginally improved. Heart size upper limits normal for technique. Aortic Atherosclerosis (ICD10-170.0) Blunting of right costophrenic angle suggesting effusion. Visualized bones unremarkable. IMPRESSION: 1. Central line to cavoatrial junction without pneumothorax. 2. Slight if any improvement in bilateral edema or infiltrates. Electronically Signed   By: Lucrezia Europe M.D.   On: 09/04/2017 12:16   Dg Chest Port 1 View  Result Date: 09/04/2017 CLINICAL DATA:  Acute congestive heart failure. Acute respiratory failure with hypoxia. Chronic kidney disease. EXAM: PORTABLE CHEST 1 VIEW COMPARISON:  09/03/2017 FINDINGS: Endotracheal tube and nasogastric tube remain in appropriate position. Patient is rotated to the left. Stable cardiomegaly. Bilateral diffuse pulmonary airspace disease shows some mild improvement allowing for differences in radiographic technique. No pneumothorax visualized. IMPRESSION: Stable  cardiomegaly. Mild improvement in diffuse bilateral pulmonary airspace disease. Electronically Signed   By: Earle Gell M.D.   On: 09/04/2017 08:54   Dg Chest Port 1 View  Result Date: 09/03/2017 CLINICAL DATA:  Endotracheal intubation EXAM: PORTABLE CHEST 1 VIEW COMPARISON:  Chest CT 09/01/2017 FINDINGS: Endotracheal tube tip is at the level of the clavicular heads. There diffuse reticular opacities throughout both lungs. Small right pleural effusion. IMPRESSION: 1. Radiographically appropriate position of endotracheal tube. 2. Diffuse interstitial opacities, most consistent with pulmonary edema. Electronically Signed   By: Ulyses Jarred M.D.   On: 09/03/2017 05:10   Dg Chest Port 1 View  Result Date: 08/31/2017 CLINICAL DATA:  62 year old female with increased shortness of breath for several days. Recent cough and body ache. EXAM: PORTABLE CHEST 1 VIEW COMPARISON:  08/29/2017 FINDINGS: Portable AP upright view at 1241 hours. Continued diffuse bilateral increased pulmonary interstitial opacity, obscuring the hilar contours. Underlying cardiomegaly. Trace pleural fluid again suspected in the fissures, but no other pleural effusion is evident. Calcified aortic atherosclerosis. No pneumothorax. Associated bibasilar hypo ventilation appears stable without definite consolidation. IMPRESSION: 1. Moderate to severe bilateral pulmonary interstitial opacity has not improved since 08/29/2017 and is nonspecific. Top differential considerations include acute pulmonary edema (slightly favored due to cardiomegaly and trace pleural fluid), viral/atypical respiratory infection, and chronic interstitial lung disease. 2. Cardiomegaly.  Aortic Atherosclerosis (ICD10-I70.0). Electronically Signed   By: Genevie Ann M.D.   On: 08/31/2017 14:40   Dg Abd Portable 1v  Result Date: 09/04/2017 CLINICAL DATA:  Pulled on orogastric tube.  Assess positioning. EXAM: PORTABLE ABDOMEN - 1 VIEW COMPARISON:  Abdominal radiograph September 03, 2016 FINDINGS: Nasogastric tube side port projects in distal stomach, unchanged with side port past GE junction. Including bowel gas pattern is nondilated and nonobstructive. Aortoiliac calcifications. Cardiomegaly and interstitial prominence included chest. IMPRESSION: Similarly position nasogastric tube with distal tip projecting in mid stomach. Aortic Atherosclerosis (ICD10-I70.0). Electronically Signed   By: Elon Alas M.D.   On: 09/04/2017 22:36    Lab Data:  CBC: Recent Labs  Lab 09/07/17 0410 09/08/17 0736 09/09/17 0600 09/10/17 0027 09/11/17 0347  WBC 17.1* 13.5* 12.3* 12.6* 11.9*  HGB 7.8* 8.2* 7.5* 7.5* 8.9*  HCT 25.6* 26.5* 24.0* 23.7* 28.0*  MCV 67.2* 66.6* 66.7* 66.9* 69.0*  PLT 364 362 336 324 784   Basic Metabolic Panel: Recent Labs  Lab 09/04/17 1556  09/05/17 0349 09/05/17 1601  09/07/17 0410 09/08/17 0736 09/09/17 0600 09/10/17 0027 09/11/17 0347  NA  --    < > 148* 152*   < > 142 141 141 139 142  K  --    < > 3.0* 2.7*   < > 2.8* 3.3* 4.1 4.2 3.8  CL  --    < > 107 108   < > 99* 100* 105 104 106  CO2  --    < > 24 30   < > 31 26 27 24 25   GLUCOSE  --    < > 156* 131*   < > 149* 117* 133* 156* 134*  BUN  --    < > 76* 62*   < > 47* 35* 30* 29* 22*  CREATININE  --    < > 2.38* 2.06*   < > 1.56* 1.46* 1.36* 1.60* 1.49*  CALCIUM  --    < > 8.4* 8.3*   < > 8.8* 9.0 8.7* 8.6* 8.9  MG 2.5*  --  2.3 2.3  --   --   --   --   --   --   PHOS 8.5*  --  6.3* 6.6*  --   --   --   --   --   --    < > = values in this interval not displayed.   GFR: Estimated Creatinine Clearance: 41.9 mL/min (A) (by C-G formula based on SCr of 1.49 mg/dL (H)). Liver Function Tests: Recent Labs  Lab 09/07/17 0410  AST 30  ALT 21  ALKPHOS 64  BILITOT 0.8  PROT 6.4*  ALBUMIN 2.6*   No results for input(s): LIPASE, AMYLASE in the last 168 hours. No results for input(s): AMMONIA in the last 168 hours. Coagulation Profile: Recent Labs  Lab 09/11/17 0347  INR 1.16    Cardiac Enzymes: Recent Labs  Lab 09/04/17 1556 09/04/17 2136 09/05/17 0349  TROPONINI 0.20* 0.19* 0.18*   BNP (last 3 results) No results for input(s): PROBNP in the last 8760 hours. HbA1C: No results for input(s): HGBA1C in the last 72 hours. CBG: Recent Labs  Lab 09/10/17 2110 09/11/17 0026 09/11/17 0401 09/11/17 0740 09/11/17 1146  GLUCAP 172* 145* 134* 153* 110*   Lipid Profile: No results for input(s): CHOL, HDL, LDLCALC, TRIG, CHOLHDL, LDLDIRECT in the last 72 hours. Thyroid Function Tests: No results for input(s): TSH, T4TOTAL, FREET4, T3FREE, THYROIDAB in the last 72 hours. Anemia Panel: No results for input(s): VITAMINB12, FOLATE, FERRITIN, TIBC, IRON, RETICCTPCT in the last 72 hours. Urine analysis:    Component Value Date/Time   COLORURINE YELLOW 08/30/2017 0159   APPEARANCEUR CLEAR 08/30/2017 0159   LABSPEC 1.010 08/30/2017 0159   PHURINE 5.0 08/30/2017 0159   GLUCOSEU 50 (A) 08/30/2017 0159   HGBUR NEGATIVE 08/30/2017 0159   BILIRUBINUR NEGATIVE 08/30/2017 0159   KETONESUR NEGATIVE 08/30/2017 0159   PROTEINUR 100 (A) 08/30/2017 0159   NITRITE NEGATIVE 08/30/2017 0159   LEUKOCYTESUR NEGATIVE 08/30/2017 0159     Ripudeep Rai M.D. [facility] 09/11/2017, 12:25 PM  Pager: [phone] Between 7am to 7pm - call Pager - [phone]  After 7pm go to [email or web address] - password [email or web address]  Call night coverage person covering after 7pm

## 2017-09-11 NOTE — Care Management Note (Addendum)
Case Management Note  Brenda Details  Name: Brenda Sims MRN: [id number] Date of Birth: 27-Dec-1955  Subjective/Objective: Pt presented as a transfer from [facility]. PEA Arrest- Intubated/ Extubated 09-06-17. Plan for Left Heart Cath 09-11-17. Recently moved from [place] to [place] with significant other.                   Action/Plan: PT/OT recommendations for Brenda Sims Services- Pt has Medicaid out of State- pt will need to apply for Medicaid of [place]. Pt will have an out of pocket cost for [place] at this time. CM will speak with pt in regards that she will have to pay for services.   Expected Discharge Date:                  Expected Discharge Plan:  Home/Self Care  In-House Referral:  NA  Discharge planning Services  CM Consult, Medication Assistance, Brenda Sims  Post Acute Care Choice:  NA Choice offered to:  NA  DME Arranged:  N/A DME Agency:  NA  HH Arranged:  NA HH Agency:  NA  Status of Service:  Completed, signed off  If discussed at Brenda Sims of Stay Meetings, dates discussed:    Additional Comments: 1046 09-12-17 Brenda Krauss, RN,BSN [phone] CM did speak with the Brenda in regards to [facility]- unable to pay out of pocket for services. Discussed PCP- CM was able to schedule an appointment with the Brenda Sims- appointment placed on AVS. Pt will be able to get medications from the Brenda Sims ranging in cost from $4.00-$10.00. Pt states she will be able to afford this cost. Pt has transportation via significant other. CM also discussed the need to fill out paperwork for Brenda Sims Medicaid and where to go to get started. Pt to be followed by Cardiology- may schedule sleep study if needed. No further needs from CM at this time.  Brenda Roys, RN 09/11/2017, 2:36 PM

## 2017-09-11 NOTE — Interval H&P Note (Signed)
History and Physical Interval Note:  09/11/2017 2:25 PM  Brenda Sims  has presented today for surgery, with the diagnosis of cp  The various methods of treatment have been discussed with the patient and family. After consideration of risks, benefits and other options for treatment, the patient has consented to  Procedure(s): LEFT HEART CATH AND CORONARY ANGIOGRAPHY (N/A) as a surgical intervention .  The patient's history has been reviewed, patient examined, no change in status, stable for surgery.  I have reviewed the patient's chart and labs.  Questions were answered to the patient's satisfaction.   Cath Lab Visit (complete for each Cath Lab visit)  Clinical Evaluation Leading to the Procedure:   ACS: Yes.    Non-ACS:    Anginal Classification: CCS IV  Anti-ischemic medical therapy: Maximal Therapy (2 or more classes of medications)  Non-Invasive Test Results: No non-invasive testing performed  Prior CABG: No previous CABG        Collier Salina [facility] 09/11/2017 2:25 PM

## 2017-09-11 NOTE — H&P (View-Only) (Signed)
Progress Note  Patient Name: Brenda Sims Date of Encounter: 09/11/2017  Primary Cardiologist: Tamala Julian  Subjective   Feeling well today. Plans for cath.   Inpatient Medications    Scheduled Meds: . amLODipine  5 mg Oral Daily  . aspirin  81 mg Oral Daily  . atorvastatin  40 mg Oral q1800  . budesonide (PULMICORT) nebulizer solution  0.25 mg Nebulization BID  . carvedilol  6.25 mg Oral BID WC  . Chlorhexidine Gluconate Cloth  6 each Topical Daily  . cloNIDine  0.2 mg Oral Q8H  . gabapentin  100 mg Oral TID  . heparin injection (subcutaneous)  5,000 Units Subcutaneous Q8H  . hydrALAZINE  100 mg Oral Q8H  . insulin aspart  0-15 Units Subcutaneous Q4H  . loratadine  10 mg Oral Daily  . montelukast  10 mg Oral QHS  . nitroGLYCERIN  0.2 mg Transdermal Q24H  . polyethylene glycol  17 g Oral Daily  . sodium chloride flush  10-40 mL Intracatheter Q12H  . sodium chloride flush  3 mL Intravenous Q12H  . sodium chloride flush  3 mL Intravenous Q12H  . zolpidem  5 mg Oral Once   Continuous Infusions: . sodium chloride Stopped (09/04/17 1741)  . sodium chloride    . sodium chloride    . sodium chloride 1 mL/kg/hr (09/09/17 7096)  . sodium chloride 1 mL/kg/hr (09/11/17 2836)   PRN Meds: sodium chloride, sodium chloride, acetaminophen (TYLENOL) oral liquid 160 mg/5 mL, albuterol, bisacodyl, docusate, hydrALAZINE, sodium chloride flush, sodium chloride flush, sodium chloride flush, temazepam   Vital Signs    Vitals:   09/11/17 0445 09/11/17 0500 09/11/17 0816 09/11/17 0819  BP: (!) 160/70     Pulse: 79     Resp: (!) 24     Temp: 98.2 F (36.8 C)     TempSrc: Axillary     SpO2: 93%  98% 98%  Weight:  187 lb 9.6 oz (85.1 kg)    Height:        Intake/Output Summary (Last 24 hours) at 09/11/2017 1019 Last data filed at 09/11/2017 0500 Gross per 24 hour  Intake 0 ml  Output 800 ml  Net -800 ml   Filed Weights   09/09/17 0455 09/10/17 0453 09/11/17 0500  Weight: 181 lb 3.5  oz (82.2 kg) 187 lb 9.8 oz (85.1 kg) 187 lb 9.6 oz (85.1 kg)    Telemetry    SR - Personally Reviewed  Physical Exam   General: Well developed, well nourished, AA female appearing in no acute distress. Head: Normocephalic, atraumatic.  Neck: Supple without bruits, JVD. Lungs:  Resp regular and unlabored, CTA. Heart: RRR, S1, S2, no S3, S4, or murmur; no rub. Abdomen: Soft, non-tender, non-distended with normoactive bowel sounds.  Extremities: No clubbing, cyanosis, edema. Distal pedal pulses are 2+ bilaterally. Neuro: Alert and oriented X 3. Moves all extremities spontaneously. Psych: Normal affect.  Labs    Chemistry Recent Labs  Lab 09/07/17 0410  09/09/17 0600 09/10/17 0027 09/11/17 0347  NA 142   < > 141 139 142  K 2.8*   < > 4.1 4.2 3.8  CL 99*   < > 105 104 106  CO2 31   < > 27 24 25   GLUCOSE 149*   < > 133* 156* 134*  BUN 47*   < > 30* 29* 22*  CREATININE 1.56*   < > 1.36* 1.60* 1.49*  CALCIUM 8.8*   < > 8.7* 8.6* 8.9  PROT  6.4*  --   --   --   --   ALBUMIN 2.6*  --   --   --   --   AST 30  --   --   --   --   ALT 21  --   --   --   --   ALKPHOS 64  --   --   --   --   BILITOT 0.8  --   --   --   --   GFRNONAA 35*   < > 41* 34* 37*  GFRAA 40*   < > 48* 39* 43*  ANIONGAP 12   < > 9 11 11    < > = values in this interval not displayed.     Hematology Recent Labs  Lab 09/09/17 0600 09/10/17 0027 09/11/17 0347  WBC 12.3* 12.6* 11.9*  RBC 3.60* 3.54* 4.06  HGB 7.5* 7.5* 8.9*  HCT 24.0* 23.7* 28.0*  MCV 66.7* 66.9* 69.0*  MCH 20.8* 21.2* 21.9*  MCHC 31.3 31.6 31.8  RDW 22.6* 22.8* 23.5*  PLT 336 324 339    Cardiac Enzymes Recent Labs  Lab 09/04/17 1556 09/04/17 2136 09/05/17 0349  TROPONINI 0.20* 0.19* 0.18*   No results for input(s): TROPIPOC in the last 168 hours.   BNPNo results for input(s): BNP, PROBNP in the last 168 hours.   DDimer No results for input(s): DDIMER in the last 168 hours.    Radiology    No results found.  Cardiac  Studies   Echocardiography, 09/03/2017:  Study Conclusions  - Left ventricle: The cavity size was mildly dilated. There was mild concentric hypertrophy. Systolic function was mildly reduced. The estimated ejection fraction was in the range of 45% to 50%. There is akinesis of the basal-midinferior myocardium. There is severe hypokinesis of the mid-apicalinferolateral myocardium. - Aortic valve: Mildly to moderately calcified annulus. Trileaflet; mildly thickened, mildly calcified leaflets. - Mitral valve: Calcified annulus. Mild focal calcification of the anterior leaflet. There was trivial regurgitation. - Left atrium: The atrium was mildly dilated. - Tricuspid valve: There was trivial regurgitation. - Impressions: mildly reduced LVF with EF 45-50% with basal inferior AK and severe mid to apical inferolateral HK, mildly calcified anterior MV leaflet with mild MR, moderately calcified AV annulus with mild AVSC, mild LAE, trivial TR. Marland KitchenCompared to study 08/30/2017, the inferolateral wall is now severely hypokinetic and LVF appears to have declined some.  Impressions:  - mildly reduced LVF with EF 45-50% with basal inferior AK and severe mid to apical inferolateral HK, mildly calcified anterior MV leaflet with mild MR, moderately calcified AV annulus with mild AVSC, mild LAE, trivial TR. Marland KitchenCompared to study 08/30/2017, the inferolateral wall is now severely hypokinetic and LVF appears to have declined some.  Patient Profile     62 y.o. female with PMH of diastolic HF, IDDM, CKD, and HTN who presented on 08/29/17 withhypoxia,PNA/CHF.Had a PEA arrest on 09/03/17, was intubated and transferred to the ICU. Extubated 01/25/19According to common-law husband, only prior cardiac issue was hypertension.  Assessment & Plan    1. PEA/Bradycardia systolic cardiac arrest: s/p PEA arrest 09/03/17 with CPR and RC within 1 minute. Intubated at that time for  acute respiratory failure and successful extubation 09/06/17.  - Labs improved this morning Hgb 8.9, Cr 1.4. Planned for cath.  - Continue ASA, statin  2. Acute on chronic combined heart failure:  - Echo with EF 45-50% and grade 2 diastolic dysfunction; Severe hypokinesis of the mid apical  inferior lateral myocardium - Diuresed well with IV lasix - net urine output -6L. Weight is trending up but doesn't appear overloaded on exam.  - Continue coreg  3. Acute renal failure vs CKD (presumably baseline stage 3) - Cr 1.4 today. Received IVFs yesterday. Continue to monitor closely.  4. HTN: BP stable - continue amlodipine, coreg, clonidine and hydralazine   5. Anemia: chronic microcytic anemia - s/p 1 unit PRBCs. Follow CBC  6. HL: on statin  Signed, Reino Bellis, NP  09/11/2017, 10:19 AM  Pager # [phone]   For questions or updates, please contact [place] Please consult [email or web address] for contact info under Cardiology/STEMI.  I have personally seen and examined this patient. I agree with the assessment and plan as outlined above.  Plans for cardiac cath today.   Lauree Chandler 09/11/2017 10:44 AM

## 2017-09-12 ENCOUNTER — Encounter (HOSPITAL_COMMUNITY): Payer: Self-pay | Admitting: Cardiology

## 2017-09-12 LAB — CBC
HEMATOCRIT: 27.1 % — AB (ref 36.0–46.0)
HEMOGLOBIN: 8.7 g/dL — AB (ref 12.0–15.0)
MCH: 22.1 pg — ABNORMAL LOW (ref 26.0–34.0)
MCHC: 32.1 g/dL (ref 30.0–36.0)
MCV: 68.8 fL — ABNORMAL LOW (ref 78.0–100.0)
Platelets: 341 10*3/uL (ref 150–400)
RBC: 3.94 MIL/uL (ref 3.87–5.11)
RDW: 23.9 % — AB (ref 11.5–15.5)
WBC: 13.1 10*3/uL — AB (ref 4.0–10.5)

## 2017-09-12 LAB — GLUCOSE, CAPILLARY
GLUCOSE-CAPILLARY: 104 mg/dL — AB (ref 65–99)
GLUCOSE-CAPILLARY: 158 mg/dL — AB (ref 65–99)
GLUCOSE-CAPILLARY: 176 mg/dL — AB (ref 65–99)

## 2017-09-12 LAB — BASIC METABOLIC PANEL
ANION GAP: 9 (ref 5–15)
BUN: 16 mg/dL (ref 6–20)
CALCIUM: 9.1 mg/dL (ref 8.9–10.3)
CO2: 25 mmol/L (ref 22–32)
Chloride: 107 mmol/L (ref 101–111)
Creatinine, Ser: 1.29 mg/dL — ABNORMAL HIGH (ref 0.44–1.00)
GFR calc Af Amer: 51 mL/min — ABNORMAL LOW (ref >60)
GFR, EST NON AFRICAN AMERICAN: 44 mL/min — AB (ref >60)
Glucose, Bld: 104 mg/dL — ABNORMAL HIGH (ref 65–99)
POTASSIUM: 3.9 mmol/L (ref 3.5–5.1)
SODIUM: 141 mmol/L (ref 135–145)

## 2017-09-12 MED ORDER — LOSARTAN POTASSIUM 25 MG PO TABS: 25.0000 mg | ORAL_TABLET | Freq: Every day | ORAL | 3 refills | Status: DC

## 2017-09-12 MED ORDER — UNABLE TO FIND: 0 refills | Status: DC

## 2017-09-12 MED ORDER — POLYETHYLENE GLYCOL 3350 17 G PO PACK: 17.0000 g | PACK | Freq: Every day | ORAL | 0 refills | Status: DC | PRN

## 2017-09-12 MED ORDER — HYDRALAZINE HCL 100 MG PO TABS: 100.0000 mg | ORAL_TABLET | Freq: Three times a day (TID) | ORAL | 3 refills | Status: DC

## 2017-09-12 MED ORDER — ATORVASTATIN CALCIUM 40 MG PO TABS: 40.0000 mg | ORAL_TABLET | Freq: Every day | ORAL | 3 refills | Status: DC

## 2017-09-12 MED ORDER — LOSARTAN POTASSIUM 25 MG PO TABS
25.0000 mg | ORAL_TABLET | Freq: Every day | ORAL | Status: DC
  Administered 2017-09-12: 25 mg via ORAL
  Filled 2017-09-12: qty 1

## 2017-09-12 MED ORDER — GABAPENTIN 100 MG PO CAPS: 100.0000 mg | ORAL_CAPSULE | Freq: Three times a day (TID) | ORAL | 1 refills | Status: DC

## 2017-09-12 MED ORDER — AMLODIPINE BESYLATE 5 MG PO TABS: 5.0000 mg | ORAL_TABLET | Freq: Every day | ORAL | 3 refills | Status: DC

## 2017-09-12 MED ORDER — CARVEDILOL 6.25 MG PO TABS: 6.2500 mg | ORAL_TABLET | Freq: Two times a day (BID) | ORAL | 3 refills | Status: DC

## 2017-09-12 MED ORDER — INFLUENZA VAC SPLIT QUAD 0.5 ML IM SUSY: 0.5000 mL | PREFILLED_SYRINGE | INTRAMUSCULAR | Status: DC

## 2017-09-12 MED ORDER — FUROSEMIDE 20 MG PO TABS: 20.0000 mg | ORAL_TABLET | Freq: Every day | ORAL | 2 refills | Status: DC

## 2017-09-12 MED ORDER — INFLUENZA VAC SPLIT QUAD 0.5 ML IM SUSY
0.5000 mL | PREFILLED_SYRINGE | Freq: Once | INTRAMUSCULAR | Status: AC
  Administered 2017-09-12: 0.5 mL via INTRAMUSCULAR

## 2017-09-12 MED FILL — hydrALAZINE HCL 100 MG TABS: 100 | 30 days supply | Qty: 90 | Fill #0

## 2017-09-12 MED FILL — ?AMLODIPINE BESYLATE 5 MG T: 5 MG | 30 days supply | Qty: 30 | Fill #0

## 2017-09-12 MED FILL — ?FUROSEMIDE 20 MG TABLET: 20 | 30 days supply | Qty: 30 | Fill #0

## 2017-09-12 MED FILL — ?ATORVASTATIN 40MG TAB: 40 | 30 days supply | Qty: 30 | Fill #0

## 2017-09-12 MED FILL — LOSARTAN POTASSIUM 25 MG TA: 25 | 30 days supply | Qty: 30 | Fill #0

## 2017-09-12 MED FILL — ?CARVEDILOL 6.25 MG TABLET: 6.25 | 30 days supply | Qty: 60 | Fill #0

## 2017-09-12 MED FILL — GABAPENTIN 300 MG CAPSULE: 300 | 30 days supply | Qty: 90 | Fill #0

## 2017-09-12 MED FILL — Lidocaine HCl Local Inj 1%: INTRAMUSCULAR | Qty: 20 | Status: AC

## 2017-09-12 NOTE — Progress Notes (Signed)
Progress Note  Patient Name: Brenda Sims Date of Encounter: 09/12/2017  Primary Cardiologist: Tamala Julian  Subjective   Feels great. Ready to go.   Inpatient Medications    Scheduled Meds: . amLODipine  5 mg Oral Daily  . aspirin  81 mg Oral Daily  . atorvastatin  40 mg Oral q1800  . budesonide (PULMICORT) nebulizer solution  0.25 mg Nebulization BID  . carvedilol  6.25 mg Oral BID WC  . cloNIDine  0.2 mg Oral Q8H  . gabapentin  100 mg Oral TID  . heparin  5,000 Units Subcutaneous Q8H  . hydrALAZINE  100 mg Oral Q8H  . insulin aspart  0-15 Units Subcutaneous Q4H  . loratadine  10 mg Oral Daily  . montelukast  10 mg Oral QHS  . nitroGLYCERIN  0.2 mg Transdermal Q24H  . polyethylene glycol  17 g Oral Daily  . sodium chloride flush  10-40 mL Intracatheter Q12H  . sodium chloride flush  3 mL Intravenous Q12H  . zolpidem  5 mg Oral Once   Continuous Infusions: . sodium chloride Stopped (09/04/17 1741)  . sodium chloride     PRN Meds: sodium chloride, acetaminophen (TYLENOL) oral liquid 160 mg/5 mL, albuterol, bisacodyl, docusate, hydrALAZINE, sodium chloride flush, sodium chloride flush, temazepam   Vital Signs    Vitals:   09/11/17 2028 09/11/17 2145 09/12/17 0421 09/12/17 0738  BP:  (!) 162/85 (!) 182/79 (!) 159/80  Pulse:   71 71  Resp:   19 (!) 21  Temp:   97.9 F (36.6 C)   TempSrc:   Axillary   SpO2: 99%  96% 97%  Weight:   181 lb 8 oz (82.3 kg)   Height:        Intake/Output Summary (Last 24 hours) at 09/12/2017 0858 Last data filed at 09/12/2017 0456 Gross per 24 hour  Intake 360 ml  Output -  Net 360 ml   Filed Weights   09/10/17 0453 09/11/17 0500 09/12/17 0421  Weight: 187 lb 9.8 oz (85.1 kg) 187 lb 9.6 oz (85.1 kg) 181 lb 8 oz (82.3 kg)    Telemetry    SR - Personally Reviewed  Physical Exam   General: Well developed, well nourished, older AA female appearing in no acute distress. Head: Normocephalic, atraumatic.  Neck: Supple without  bruits, JVD. Lungs:  Resp regular and unlabored, CTA. Heart: RRR, S1, S2, no S3, S4, or murmur; no rub. Abdomen: Soft, non-tender, non-distended with normoactive bowel sounds. Extremities: No clubbing, cyanosis, edema. Distal pedal pulses are 2+ bilaterally. Right radial cath site stable.  Neuro: Alert and oriented X 3. Moves all extremities spontaneously. Psych: Normal affect.  Labs    Chemistry Recent Labs  Lab 09/07/17 0410  09/10/17 0027 09/11/17 0347 09/11/17 1826 09/12/17 0710  NA 142   < > 139 142  --  141  K 2.8*   < > 4.2 3.8  --  3.9  CL 99*   < > 104 106  --  107  CO2 31   < > 24 25  --  25  GLUCOSE 149*   < > 156* 134*  --  104*  BUN 47*   < > 29* 22*  --  16  CREATININE 1.56*   < > 1.60* 1.49* 1.27* 1.29*  CALCIUM 8.8*   < > 8.6* 8.9  --  9.1  PROT 6.4*  --   --   --   --   --   ALBUMIN 2.6*  --   --   --   --   --  AST 30  --   --   --   --   --   ALT 21  --   --   --   --   --   ALKPHOS 64  --   --   --   --   --   BILITOT 0.8  --   --   --   --   --   GFRNONAA 35*   < > 34* 37* 45* 44*  GFRAA 40*   < > 39* 43* 52* 51*  ANIONGAP 12   < > 11 11  --  9   < > = values in this interval not displayed.     Hematology Recent Labs  Lab 09/11/17 0347 09/11/17 1826 09/12/17 0710  WBC 11.9* 11.8* 13.1*  RBC 4.06 4.13 3.94  HGB 8.9* 9.0* 8.7*  HCT 28.0* 28.5* 27.1*  MCV 69.0* 69.0* 68.8*  MCH 21.9* 21.8* 22.1*  MCHC 31.8 31.6 32.1  RDW 23.5* 23.7* 23.9*  PLT 339 318 341    Cardiac EnzymesNo results for input(s): TROPONINI in the last 168 hours. No results for input(s): TROPIPOC in the last 168 hours.   BNPNo results for input(s): BNP, PROBNP in the last 168 hours.   DDimer No results for input(s): DDIMER in the last 168 hours.    Radiology    No results found.  Cardiac Studies   Cath: 09/11/17  Conclusion     Ost Ramus to Ramus lesion is 70% stenosed.  Prox Cx to Mid Cx lesion is 65% stenosed.  Mid RCA to Dist RCA lesion is 50%  stenosed.  LV end diastolic pressure is mildly elevated.  There is moderate left ventricular systolic dysfunction.  The left ventricular ejection fraction is 35-45% by visual estimate.   1. Moderate nonobstructive CAD 2. Moderate LV dysfunction. 3. Mildly elevated LVEDP  Plan: medical management.    Patient Profile     62 y.o. female with PMH of diastolic HF, IDDM, CKD, and HTN who presented on 08/29/17 withhypoxia,PNA/CHF.Had a PEA arrest on 09/03/17, was intubated and transferred to the ICU. Extubated 01/25/19According to common-law husband, only prior cardiac issue was hypertension. Underwent cardiac 2/2 to abnormal echo.   Assessment & Plan    1. PEA/Bradycardia systolic cardiac arrest:s/p PEA arrest 09/03/17 with CPR and RC within 1 minute. Intubated at that time for acute respiratory failure and successful extubation 09/06/17. Underwent cardiac cath with moderate non-obstructive CAD.   - Continue ASA, statin  2. Acute on chronic combined heart failure:  - Echo with EF 45-50% and grade 2 diastolic dysfunction;Severe hypokinesis of the mid apical inferior lateral myocardium - Continue coreg, amlodipine, clonidine, hydralazine - add losartan given NICM, resume lasix 20mg  daily - discussed need for low sodium diet, and daily weights  3. Acute renal failure vs CKD (presumably baseline stage 3)- Cr 1.2 today.  -- recheck BMET at outpatient follow up appt.   4. HTN: BP stable - recommendations as above  5. Anemia:chronic microcytic anemia - s/p 1 unit PRBCs. H/H improved  6. HL: on statin  - Will arrange outpatient follow up in the office. Consider rechecking echo in 6 weeks to follow up LV function.   Signed, Reino Bellis, NP  09/12/2017, 8:58 AM  Pager # [phone]   For questions or updates, please contact [place] Please consult [email or web address] for contact info under Cardiology/STEMI.  I have personally seen and examined this patient. I agree with  the assessment and plan as  outlined above.  Moderate non-obstructive CAD by cath yesterday. Will medically manage her CAD with ASA, statin and beta blocker. Will add Losartan given reduced LVEF.  She can be discharged today.  Will arrange follow up with Dr. Tamala Julian after d/c  Lauree Chandler 09/12/2017 10:07 AM

## 2017-09-12 NOTE — Discharge Summary (Addendum)
Physician Discharge Summary   Patient ID: Brenda Sims MRN: [id number] DOB/AGE: October 07, 1955 62 y.o.  Admit date: 08/29/2017 Discharge date: 09/12/2017  Primary Care Physician:  Patient, No Pcp Per  Discharge Diagnoses:    . Cardiac arrest ([place]) Acute hypoxic respiratory failure Acute on chronic combined systolic and diastolic CHF N STEMI Acute metabolic encephalopathy Acute pulmonary edema with pneumonia Essential hypertension Chronic microcytic anemia Tobacco use Generalized deconditioning Type 2 diabetes mellitus   Consults: Cardiology Critical care CIR  Recommendations for Outpatient Follow-up:  1. Home health PT, OT, RN to be arranged by case management 2. Please repeat CBC/BMET at next visit 3. Please note multiple medication changes, citalopram, Adalat, labetalol discontinued 4. Gabapentin dose decreased to 100 mg 3 times daily   DIET: Carb modified    Allergies:   Allergies  Allergen Reactions  . Azithromycin Swelling  . Eggs Or Egg-Derived Products     Per pt allergy to egg. Makes her break out and have difficulty breathing.     DISCHARGE MEDICATIONS: Allergies as of 09/12/2017      Reactions   Azithromycin Swelling   Eggs Or Egg-derived Products    Per pt allergy to egg. Makes her break out and have difficulty breathing.      Medication List    STOP taking these medications   citalopram 20 MG tablet Commonly known as:  CELEXA   labetalol 200 MG tablet Commonly known as:  NORMODYNE   NIFEdipine 90 MG 24 hr tablet Commonly known as:  ADALAT CC     TAKE these medications   amLODipine 5 MG tablet Commonly known as:  NORVASC Take 1 tablet (5 mg total) by mouth daily. Start taking on:  09/13/2017   aspirin EC 81 MG tablet Take 81 mg by mouth daily.   atorvastatin 40 MG tablet Commonly known as:  LIPITOR Take 1 tablet (40 mg total) by mouth at bedtime.   carvedilol 6.25 MG tablet Commonly known as:  COREG Take 1 tablet (6.25 mg total) by  mouth 2 (two) times daily with a meal.   cetirizine 10 MG tablet Commonly known as:  ZYRTEC Take 10 mg by mouth daily.   cloNIDine 0.2 mg/24hr patch Commonly known as:  CATAPRES - Dosed in mg/24 hr Place 0.2 mg onto the skin once a week.   furosemide 20 MG tablet Commonly known as:  LASIX Take 1 tablet (20 mg total) by mouth daily.   gabapentin 100 MG capsule Commonly known as:  NEURONTIN Take 1 capsule (100 mg total) by mouth 3 (three) times daily. What changed:    medication strength  how much to take   hydrALAZINE 100 MG tablet Commonly known as:  APRESOLINE Take 1 tablet (100 mg total) by mouth 3 (three) times daily.   insulin aspart 100 UNIT/ML injection Commonly known as:  novoLOG Inject 20 Units into the skin 3 (three) times daily before meals.   losartan 25 MG tablet Commonly known as:  COZAAR Take 1 tablet (25 mg total) by mouth daily.   metFORMIN 1000 MG tablet Commonly known as:  GLUCOPHAGE Take 1,000 mg by mouth 2 (two) times daily with a meal.   montelukast 10 MG tablet Commonly known as:  SINGULAIR Take 10 mg by mouth at bedtime.   polyethylene glycol packet Commonly known as:  MIRALAX / GLYCOLAX Take 17 g by mouth daily as needed for moderate constipation.        Brief H and P: For complete details please refer to  admission H and P, but in brief HPI On 08/29/2017 by Dr. Neena Rhymes Rufusis a 62 y.o.femalewith medical history significant ofhypertension, type 2 diabetes on metformin and Lantus at home, obesity, mild intermittent asthma, knee surgery, presented with shortness of breath, dyspnea on exertion and coughing worsening for last to 3 days. Patient had moved from [place] to [place] about a month ago. Patient and her husband reported that the symptoms started before moving here. She was having coughing and gradually worsening dyspnea on exertion. The symptoms gradually worsened to the point that she was having difficulty  breathing. She went to the urgent care where she was found to be hypoxic did not improve with the breathing treatment therefore directed her to the ER. Patient reported subjective fever, chills, nausea and overall not feeling well for last few weeks. Denies headache, dizziness, abdomen pain, dysuria urgency, diarrhea or constipation.    Hospital Course:  Cardiopulmonary arrest (HCC)/PEA arrest with acute hypoxic respiratory failure -On 1/22 AM, patient was noted to be unresponsive, pulseless and code blue was called. ROSC achieved after approximately 1 minute, however patient was noticed to have respiratory failure and was subsequently intubated.  Patient was extubated on 1/25, transferred by CCM service to hospitalist on 1/27 -Significantly improved, O2 sat 99% on room air at the time of discharge.    Acute on chronic combined systolic and diastolic CHF (HCC)/ NSTEMI -2D echo 1/18 showed EF of 50-55% with grade 2 diastolic dysfunction -Limited echo 1/22 after the cardiac arrest showed EF of 45-50%, akinesis of basal mid inferior myocardium, severe hypokinesis of mid apical inferior lateral myocardium cardiology following, plan for --Patient transfused 1 unit packed RBC on 1/29,  globin 8.7 at the time of discharge -Patient underwent cardiac cath on 1/30 which showed moderate nonobstructive CAD, moderate LV dysfunction, EF 35-45% by visual estimate, recommended medical management -Patient started on losartan 25 mg daily -Discussed with cardiology PA, they will make follow-up appointment with Dr. Tamala Julian  Acute metabolic encephalopathy -EEG with diffuse background suppression but no electrographic seizures noted -Resolved, patient currently alert and oriented x3, confirmed by husband at the bedside  Acute hypoxic respiratory failure in the setting of pneumonia, acute pulmonary edema, cardiac arrest  -Patient was extubated on 1/25, significantly improved, O2 sats 91% on room air at the time  of discharge   Essential hypertension -Currently stable, continue amlodipine, clonidine patch, Coreg, Lasix -Labetalol discontinued  Diabetes mellitus type 2 -HbA1c 6.7, patient was placed on sliding scale insulin while inpatient  Chronic microcytic anemia -Anemia panel showed iron 14, ferritin 87, follow outpatient with PCP, GI workup - Transfused 1 unit packed RBCs on 1/29   Tobacco use -Counseled on smoking cessation  Generalized deconditioning -PT evaluation recommended home health, arranged by case management    Day of Discharge BP (!) 159/80 (BP Location: Left Arm)   Pulse 71   Temp 97.9 F (36.6 C) (Axillary)   Resp (!) 22   Ht 5\' 4"  (1.626 m)   Wt 82.3 kg (181 lb 8 oz)   SpO2 99%   BMI 31.15 kg/m   Physical Exam: General: Alert and awake oriented x3 not in any acute distress. HEENT: anicteric sclera, pupils reactive to light and accommodation CVS: S1-S2 clear no murmur rubs or gallops Chest: clear to auscultation bilaterally, no wheezing rales or rhonchi Abdomen: soft nontender, nondistended, normal bowel sounds Extremities: no cyanosis, clubbing or edema noted bilaterally Neuro: Cranial nerves II-XII intact, no focal neurological deficits   The  results of significant diagnostics from this hospitalization (including imaging, microbiology, ancillary and laboratory) are listed below for reference.    LAB RESULTS: Basic Metabolic Panel: Recent Labs  Lab 09/05/17 1601  09/11/17 0347 09/11/17 1826 09/12/17 0710  NA 152*   < > 142  --  141  K 2.7*   < > 3.8  --  3.9  CL 108   < > 106  --  107  CO2 30   < > 25  --  25  GLUCOSE 131*   < > 134*  --  104*  BUN 62*   < > 22*  --  16  CREATININE 2.06*   < > 1.49* 1.27* 1.29*  CALCIUM 8.3*   < > 8.9  --  9.1  MG 2.3  --   --   --   --   PHOS 6.6*  --   --   --   --    < > = values in this interval not displayed.   Liver Function Tests: Recent Labs  Lab 09/07/17 0410  AST 30  ALT 21  ALKPHOS 64   BILITOT 0.8  PROT 6.4*  ALBUMIN 2.6*   No results for input(s): LIPASE, AMYLASE in the last 168 hours. No results for input(s): AMMONIA in the last 168 hours. CBC: Recent Labs  Lab 09/11/17 1826 09/12/17 0710  WBC 11.8* 13.1*  HGB 9.0* 8.7*  HCT 28.5* 27.1*  MCV 69.0* 68.8*  PLT 318 341   Cardiac Enzymes: No results for input(s): CKTOTAL, CKMB, CKMBINDEX, TROPONINI in the last 168 hours. BNP: Invalid input(s): POCBNP CBG: Recent Labs  Lab 09/12/17 0419 09/12/17 0734  GLUCAP 158* 104*    Significant Diagnostic Studies:  Dg Chest 2 View  Result Date: 08/29/2017 CLINICAL DATA:  Cough and body ache EXAM: CHEST  2 VIEW COMPARISON:  None. FINDINGS: No large effusion. Cardiomegaly with vascular congestion. Diffuse interstitial and mild alveolar opacity with more confluent opacity at the right base. Aortic atherosclerosis. No pneumothorax. IMPRESSION: 1. Cardiomegaly with vascular congestion. Diffuse interstitial opacities suggests underlying edema. 2. More confluent opacity at the right greater than left lung base could reflect superimposed pneumonia Electronically Signed   By: Donavan Foil M.D.   On: 08/29/2017 15:52   US Renal  Result Date: 08/29/2017 CLINICAL DATA:  62 year old female with chronic kidney disease. EXAM: RENAL / URINARY TRACT ULTRASOUND COMPLETE COMPARISON:  None. FINDINGS: Right Kidney: Length: 9.1 cm. There is diffuse increased renal echogenicity. No hydronephrosis or shadowing stone. Mild renal pelviectasis. Left Kidney: Length: 11 cm. Minimally increased echogenicity. No hydronephrosis or shadowing stone. Bladder: Appears normal for degree of bladder distention. Trace left pleural effusion noted. IMPRESSION: Mildly atrophic and echogenic right kidney. Clinical correlation is recommended. No hydronephrosis or shadowing stone. Trace left pleural effusion. Electronically Signed   By: Anner Crete M.D.   On: 08/29/2017 22:39    2D ECHO: Impressions:  -  mildly reduced LVF with EF 45-50% with basal inferior AK and   severe mid to apical inferolateral HK, mildly calcified anterior   MV leaflet with mild MR, moderately calcified AV annulus with   mild AVSC, mild LAE, trivial TR. Marland KitchenCompared to study 08/30/2017,   the inferolateral wall is now severely hypokinetic and LVF   appears to have declined some.  Cardiac cath 09/11/2017  conclusion     Ost Ramus to Ramus lesion is 70% stenosed.  Prox Cx to Mid Cx lesion is 65% stenosed.  Mid RCA to  Dist RCA lesion is 50% stenosed.  LV end diastolic pressure is mildly elevated.  There is moderate left ventricular systolic dysfunction.  The left ventricular ejection fraction is 35-45% by visual estimate.   1. Moderate nonobstructive CAD 2. Moderate LV dysfunction. 3. Mildly elevated LVEDP  Plan: medical management.      Disposition and Follow-up: Discharge Instructions    (HEART FAILURE PATIENTS) Call MD:  Anytime you have any of the following symptoms: 1) 3 pound weight gain in 24 hours or 5 pounds in 1 week 2) shortness of breath, with or without a dry hacking cough 3) swelling in the hands, feet or stomach 4) if you have to sleep on extra pillows at night in order to breathe.   Complete by:  As directed    Diet Carb Modified   Complete by:  As directed    Increase activity slowly   Complete by:  As directed        DISPOSITION: [facility]. Go on 09/09/2017.   Why:  @8 :30am Contact information: [address] [place] [address] [phone]           Time spent on Discharge: 37mins   Signed:   Estill Cotta M.D. [facility] 09/12/2017, 10:27 AM Pager: [phone]    Coding query addendum Acute on chronic kidney disease stage III Creatinine 1.2 at the time of discharge. Plateaued at 2.33, admitted with creatinine of 1.43. Baseline creatinine  unknown.    Estill Cotta M.D. [facility] 09/18/2017, 12:29 PM  Pager: [phone]

## 2017-09-12 NOTE — Progress Notes (Addendum)
Discharge instruction was given to pt and SO. Belongings were sent home with pt.  Administered a flu shot prior to discharge pr pt request. Brenda Sims

## 2017-09-12 NOTE — Plan of Care (Signed)
  Cardiovascular: Vascular access site(s) Level 0-1 will be maintained 09/12/2017 0128 - Progressing by Jacqlyn Larsen, RN Note Site level "0". No complications noted.

## 2017-09-19 ENCOUNTER — Ambulatory Visit: Payer: Medicaid - Out of State | Admitting: Family Medicine

## 2017-09-23 ENCOUNTER — Ambulatory Visit: Payer: Self-pay | Admitting: Nurse Practitioner

## 2017-09-23 NOTE — Progress Notes (Deleted)
CARDIOLOGY OFFICE NOTE  Date:  09/23/2017    [name] Date of Birth: [birth year] Medical Record #[id number]  PCP:  Patient, No Pcp Per  Cardiologist:  Servando Snare & ***    No chief complaint on file.   History of Present Illness: Brenda Sims is a 62 y.o. female who presents today for a ***  femalewith medical history significant ofhypertension, type 2 diabetes on metformin and Lantus at home, obesity, mild intermittent asthma, knee surgery, presented with shortness of breath, dyspnea on exertion and coughing worsening for last to 3 days. Patient hadmoved from [place] to [place] about a month ago. Patient and her husband reported that the symptoms started before moving here. She was having coughing and gradually worsening dyspnea on exertion. The symptoms gradually worsened to the point that she was having difficulty breathing. She went to the urgent care where she was found to be hypoxic did not improve with the breathing treatment therefore directed her to the ER. Patient reported subjective fever, chills, nausea and overall not feeling well for last few weeks. Denies headache, dizziness, abdomen pain, dysuria urgency, diarrhea or constipation.    Hospital Course:  Cardiopulmonary arrest (HCC)/PEA arrest with acute hypoxic respiratory failure -On 1/22AM,patient was noted to be unresponsive, pulselessand code blue was called. ROSCachieved after approximately 1 minute, however patient was noticed to have respiratory failure and was subsequently intubated. Patient was extubated on 1/25, transferred by [facility] to hospitalist on 1/27 -Significantly improved, O2 sat 99% on room air at the time of discharge.  Acute on chronic combined systolic and diastolic CHF (HCC)/ NSTEMI -2D echo 1/18 showed EF of 50-55% with grade 2 diastolic dysfunction -Limited echo 1/22 after the cardiac arrest showed EF of 45-50%, akinesis of basal mid inferior myocardium, severe  hypokinesis of mid apical inferior lateral myocardium cardiology following, plan for --Patient transfused 1 unit packed RBC on 1/29, globin 8.7 at the time of discharge -Patient underwent cardiac cath on 1/30 which showed moderate nonobstructive CAD, moderate LV dysfunction, EF 35-45% by visual estimate, recommended medical management -Patient started on losartan 25 mg daily -Discussed with cardiology PA, they will make follow-up appointment with Dr. Tamala Julian  Acute metabolic encephalopathy -EEG with diffuse background suppression but no electrographic seizures noted -Resolved, patient currently alert and oriented x3, confirmed by husband at the bedside  Acute hypoxic respiratory failure in the setting of pneumonia, acute pulmonary edema, cardiac arrest -Patient was extubated on 1/25, significantly improved, O2 sats 91% on room air at the time of discharge   Essential hypertension -Currently stable, continue amlodipine, clonidine patch, Coreg, Lasix -Labetalol discontinued  Diabetes mellitus type 2 -HbA1c 6.7, patient was placed on sliding scale insulin while inpatient  Chronic microcytic anemia -Anemia panel showed iron 14, ferritin 87, follow outpatient with PCP, GI workup -Transfused1 unit packed RBCs on 1/29   Tobacco use -Counseled on smoking cessation  Generalized deconditioning -PT evaluation recommended home health, arranged by case management    Comes in today. Here with   Past Medical History:  Diagnosis Date  . CKD (chronic kidney disease)   . Diabetes mellitus without complication ([place])   . Hypertension     Past Surgical History:  Procedure Laterality Date  . APPENDECTOMY    . BACK SURGERY    . LEFT HEART CATH AND CORONARY ANGIOGRAPHY N/A 09/11/2017   Procedure: LEFT HEART CATH AND CORONARY ANGIOGRAPHY;  Surgeon: Martinique, Peter M, MD;  Location: [facility];  Service: Cardiovascular;  Laterality: N/A;  .  TOTAL KNEE ARTHROPLASTY Right 2007      Medications: No outpatient medications have been marked as taking for the 09/23/17 encounter (Appointment) with Burtis Junes, NP.     Allergies: Allergies  Allergen Reactions  . Azithromycin Swelling  . Eggs Or Egg-Derived Products     Per pt allergy to egg. Makes her break out and have difficulty breathing.    Social History: The patient  reports that  has never smoked. she has never used smokeless tobacco. She reports that she does not drink alcohol or use drugs.   Family History: The patient's ***family history is not on file.   Review of Systems: Please see the history of present illness.   Otherwise, the review of systems is positive for {NONE DEFAULTED:18576::"none"}.   All other systems are reviewed and negative.   Physical Exam: VS:  There were no vitals taken for this visit. Marland Kitchen  BMI There is no height or weight on file to calculate BMI.  Wt Readings from Last 3 Encounters:  09/12/17 181 lb 8 oz (82.3 kg)    General: Pleasant. Well developed, well nourished and in no acute distress.   HEENT: Normal.  Neck: Supple, no JVD, carotid bruits, or masses noted.  Cardiac: ***Regular rate and rhythm. No murmurs, rubs, or gallops. No edema.  Respiratory:  Lungs are clear to auscultation bilaterally with normal work of breathing.  GI: Soft and nontender.  MS: No deformity or atrophy. Gait and ROM intact.  Skin: Warm and dry. Color is normal.  Neuro:  Strength and sensation are intact and no gross focal deficits noted.  Psych: Alert, appropriate and with normal affect.   LABORATORY DATA:  EKG:  EKG {ACTION; IS/IS JIR:[id number]} ordered today. This demonstrates ***.  Lab Results  Component Value Date   WBC 13.1 (H) 09/12/2017   HGB 8.7 (L) 09/12/2017   HCT 27.1 (L) 09/12/2017   PLT 341 09/12/2017   GLUCOSE 104 (H) 09/12/2017   CHOL 153 09/05/2017   TRIG 234 (H) 09/06/2017   HDL 16 (L) 09/05/2017   LDLCALC 90 09/05/2017   ALT 21 09/07/2017   AST 30  09/07/2017   NA 141 09/12/2017   K 3.9 09/12/2017   CL 107 09/12/2017   CREATININE 1.29 (H) 09/12/2017   BUN 16 09/12/2017   CO2 25 09/12/2017   TSH 2.308 09/03/2017   INR 1.16 09/11/2017   HGBA1C 6.7 (H) 09/05/2017     BNP (last 3 results) Recent Labs    08/29/17 1530  BNP 1,016.4*    ProBNP (last 3 results) No results for input(s): PROBNP in the last 8760 hours.   Other Studies Reviewed Today: Cath: 09/11/17  Conclusion     Ost Ramus to Ramus lesion is 70% stenosed.  Prox Cx to Mid Cx lesion is 65% stenosed.  Mid RCA to Dist RCA lesion is 50% stenosed.  LV end diastolic pressure is mildly elevated.  There is moderate left ventricular systolic dysfunction.  The left ventricular ejection fraction is 35-45% by visual estimate.  1. Moderate nonobstructive CAD 2. Moderate LV dysfunction. 3. Mildly elevated LVEDP  Plan: medical management.       Assessment/Plan: 1. PEA/Bradycardia systolic cardiac arrest:s/p PEA arrest 09/03/17 with CPR and RC within 1 minute. Intubated at that time for acute respiratory failure and successful extubation 09/06/17. Underwent cardiac cath with moderate non-obstructive CAD.   - Continue ASA, statin  2. Acute on chronic combined heart failure:  - Echo with EF 45-50% and grade  2 diastolic dysfunction;Severe hypokinesis of the mid apical inferior lateral myocardium - Continue coreg, amlodipine, clonidine, hydralazine - add losartan given NICM, resume lasix 20mg  daily - discussed need for low sodium diet, and daily weights  3. Acute renal failure vs CKD (presumably baseline stage 3)- Cr 1.2 today.  -- recheck BMET at outpatient follow up appt.   4. HTN: BP stable - recommendations as above  5. Anemia:chronic microcytic anemia -s/p 1 unit PRBCs. H/H improved  6. HL:on statin  - Will arrange outpatient follow up in the office. Consider rechecking echo in 6 weeks to follow up LV function.     Current  medicines are reviewed with the patient today.  The patient does not have concerns regarding medicines other than what has been noted above.  The following changes have been made:  See above.  Labs/ tests ordered today include:   No orders of the defined types were placed in this encounter.    Disposition:   FU with *** in {gen number 4-43:154008} {Days to years:10300}.   Patient is agreeable to this plan and will call if any problems develop in the interim.   SignedTruitt Merle, NP  09/23/2017 7:37 AM  [facility] [address] [place], [place]  [address] Phone: [phone] Fax: [phone]

## 2017-09-30 ENCOUNTER — Encounter: Payer: Self-pay | Admitting: Nurse Practitioner

## 2017-10-02 MED FILL — ?CARVEDILOL 6.25 MG TABLET: 6.25 | 30 days supply | Qty: 60 | Fill #1

## 2017-10-07 ENCOUNTER — Encounter (HOSPITAL_COMMUNITY): Payer: Self-pay | Admitting: *Deleted

## 2017-10-07 ENCOUNTER — Observation Stay (HOSPITAL_COMMUNITY)
Admission: EM | Admit: 2017-10-07 | Discharge: 2017-10-08 | Disposition: A | Discharge status: Home / Self Care | Payer: Medicaid Other | Attending: Internal Medicine | Admitting: Internal Medicine

## 2017-10-07 ENCOUNTER — Emergency Department (HOSPITAL_COMMUNITY): Payer: Medicaid Other

## 2017-10-07 ENCOUNTER — Other Ambulatory Visit: Payer: Self-pay

## 2017-10-07 DIAGNOSIS — I252 Old myocardial infarction: Secondary | ICD-10-CM | POA: Insufficient documentation

## 2017-10-07 DIAGNOSIS — I130 Hypertensive heart and chronic kidney disease with heart failure and stage 1 through stage 4 chronic kidney disease, or unspecified chronic kidney disease: Secondary | ICD-10-CM | POA: Insufficient documentation

## 2017-10-07 DIAGNOSIS — J9601 Acute respiratory failure with hypoxia: Principal | ICD-10-CM | POA: Insufficient documentation

## 2017-10-07 DIAGNOSIS — Z7982 Long term (current) use of aspirin: Secondary | ICD-10-CM | POA: Insufficient documentation

## 2017-10-07 DIAGNOSIS — I5042 Chronic combined systolic (congestive) and diastolic (congestive) heart failure: Secondary | ICD-10-CM | POA: Diagnosis not present

## 2017-10-07 DIAGNOSIS — Z794 Long term (current) use of insulin: Secondary | ICD-10-CM | POA: Diagnosis not present

## 2017-10-07 DIAGNOSIS — Z881 Allergy status to other antibiotic agents status: Secondary | ICD-10-CM | POA: Diagnosis not present

## 2017-10-07 DIAGNOSIS — J40 Bronchitis, not specified as acute or chronic: Secondary | ICD-10-CM | POA: Diagnosis present

## 2017-10-07 DIAGNOSIS — E785 Hyperlipidemia, unspecified: Secondary | ICD-10-CM | POA: Diagnosis not present

## 2017-10-07 DIAGNOSIS — I700 Atherosclerosis of aorta: Secondary | ICD-10-CM | POA: Diagnosis not present

## 2017-10-07 DIAGNOSIS — R0902 Hypoxemia: Secondary | ICD-10-CM | POA: Diagnosis present

## 2017-10-07 DIAGNOSIS — E11649 Type 2 diabetes mellitus with hypoglycemia without coma: Secondary | ICD-10-CM | POA: Insufficient documentation

## 2017-10-07 DIAGNOSIS — J45909 Unspecified asthma, uncomplicated: Secondary | ICD-10-CM | POA: Insufficient documentation

## 2017-10-07 DIAGNOSIS — D72829 Elevated white blood cell count, unspecified: Secondary | ICD-10-CM | POA: Diagnosis not present

## 2017-10-07 DIAGNOSIS — Z91018 Allergy to other foods: Secondary | ICD-10-CM | POA: Diagnosis not present

## 2017-10-07 DIAGNOSIS — E1122 Type 2 diabetes mellitus with diabetic chronic kidney disease: Secondary | ICD-10-CM | POA: Diagnosis not present

## 2017-10-07 DIAGNOSIS — Z96652 Presence of left artificial knee joint: Secondary | ICD-10-CM | POA: Diagnosis not present

## 2017-10-07 DIAGNOSIS — E876 Hypokalemia: Secondary | ICD-10-CM | POA: Diagnosis present

## 2017-10-07 DIAGNOSIS — F17200 Nicotine dependence, unspecified, uncomplicated: Secondary | ICD-10-CM | POA: Insufficient documentation

## 2017-10-07 DIAGNOSIS — E162 Hypoglycemia, unspecified: Secondary | ICD-10-CM

## 2017-10-07 DIAGNOSIS — E119 Type 2 diabetes mellitus without complications: Secondary | ICD-10-CM | POA: Diagnosis not present

## 2017-10-07 DIAGNOSIS — G9340 Encephalopathy, unspecified: Secondary | ICD-10-CM | POA: Diagnosis not present

## 2017-10-07 DIAGNOSIS — I25119 Atherosclerotic heart disease of native coronary artery with unspecified angina pectoris: Secondary | ICD-10-CM | POA: Insufficient documentation

## 2017-10-07 DIAGNOSIS — N183 Chronic kidney disease, stage 3 (moderate): Secondary | ICD-10-CM | POA: Diagnosis not present

## 2017-10-07 DIAGNOSIS — M79621 Pain in right upper arm: Secondary | ICD-10-CM | POA: Insufficient documentation

## 2017-10-07 DIAGNOSIS — R569 Unspecified convulsions: Secondary | ICD-10-CM | POA: Diagnosis not present

## 2017-10-07 DIAGNOSIS — I10 Essential (primary) hypertension: Secondary | ICD-10-CM | POA: Diagnosis present

## 2017-10-07 DIAGNOSIS — Z79899 Other long term (current) drug therapy: Secondary | ICD-10-CM | POA: Diagnosis not present

## 2017-10-07 DIAGNOSIS — D631 Anemia in chronic kidney disease: Secondary | ICD-10-CM | POA: Insufficient documentation

## 2017-10-07 DIAGNOSIS — E1129 Type 2 diabetes mellitus with other diabetic kidney complication: Secondary | ICD-10-CM

## 2017-10-07 DIAGNOSIS — IMO0001 Reserved for inherently not codable concepts without codable children: Secondary | ICD-10-CM

## 2017-10-07 DIAGNOSIS — D649 Anemia, unspecified: Secondary | ICD-10-CM | POA: Diagnosis present

## 2017-10-07 DIAGNOSIS — J209 Acute bronchitis, unspecified: Secondary | ICD-10-CM | POA: Diagnosis not present

## 2017-10-07 DIAGNOSIS — I5032 Chronic diastolic (congestive) heart failure: Secondary | ICD-10-CM | POA: Diagnosis present

## 2017-10-07 DIAGNOSIS — N179 Acute kidney failure, unspecified: Secondary | ICD-10-CM | POA: Diagnosis present

## 2017-10-07 LAB — I-STAT CG4 LACTIC ACID, ED
LACTIC ACID, VENOUS: 1.15 mmol/L (ref 0.5–1.9)
Lactic Acid, Venous: 1.25 mmol/L (ref 0.5–1.9)

## 2017-10-07 LAB — BASIC METABOLIC PANEL
ANION GAP: 14 (ref 5–15)
BUN: 12 mg/dL (ref 6–20)
CALCIUM: 9 mg/dL (ref 8.9–10.3)
CHLORIDE: 101 mmol/L (ref 101–111)
CO2: 28 mmol/L (ref 22–32)
Creatinine, Ser: 1.29 mg/dL — ABNORMAL HIGH (ref 0.44–1.00)
GFR calc non Af Amer: 44 mL/min — ABNORMAL LOW (ref >60)
GFR, EST AFRICAN AMERICAN: 51 mL/min — AB (ref >60)
Glucose, Bld: 47 mg/dL — ABNORMAL LOW (ref 65–99)
POTASSIUM: 2.5 mmol/L — AB (ref 3.5–5.1)
Sodium: 143 mmol/L (ref 135–145)

## 2017-10-07 LAB — CBC
HCT: 33 % — ABNORMAL LOW (ref 36.0–46.0)
HEMOGLOBIN: 10.5 g/dL — AB (ref 12.0–15.0)
MCH: 21.6 pg — AB (ref 26.0–34.0)
MCHC: 31.8 g/dL (ref 30.0–36.0)
MCV: 67.9 fL — AB (ref 78.0–100.0)
Platelets: 327 10*3/uL (ref 150–400)
RBC: 4.86 MIL/uL (ref 3.87–5.11)
RDW: 23.7 % — ABNORMAL HIGH (ref 11.5–15.5)
WBC: 10.1 10*3/uL (ref 4.0–10.5)

## 2017-10-07 LAB — I-STAT TROPONIN, ED: TROPONIN I, POC: 0.02 ng/mL (ref 0.00–0.08)

## 2017-10-07 LAB — MAGNESIUM: Magnesium: 2 mg/dL (ref 1.7–2.4)

## 2017-10-07 LAB — GLUCOSE, CAPILLARY: GLUCOSE-CAPILLARY: 212 mg/dL — AB (ref 65–99)

## 2017-10-07 LAB — CBG MONITORING, ED
GLUCOSE-CAPILLARY: 72 mg/dL (ref 65–99)
GLUCOSE-CAPILLARY: 213 mg/dL — AB (ref 65–99)

## 2017-10-07 LAB — BRAIN NATRIURETIC PEPTIDE: B Natriuretic Peptide: 419.8 pg/mL — ABNORMAL HIGH (ref 0.0–100.0)

## 2017-10-07 MED ORDER — LOSARTAN POTASSIUM 25 MG PO TABS
25.0000 mg | ORAL_TABLET | Freq: Every day | ORAL | Status: DC
  Administered 2017-10-08: 25 mg via ORAL
  Filled 2017-10-07: qty 1

## 2017-10-07 MED ORDER — ALBUTEROL SULFATE (2.5 MG/3ML) 0.083% IN NEBU: 2.5000 mg | INHALATION_SOLUTION | RESPIRATORY_TRACT | Status: DC | PRN

## 2017-10-07 MED ORDER — INSULIN ASPART 100 UNIT/ML ~~LOC~~ SOLN
0.0000 [IU] | Freq: Three times a day (TID) | SUBCUTANEOUS | Status: DC
  Administered 2017-10-08: 9 [IU] via SUBCUTANEOUS
  Administered 2017-10-08: 3 [IU] via SUBCUTANEOUS

## 2017-10-07 MED ORDER — ASPIRIN EC 81 MG PO TBEC
81.0000 mg | DELAYED_RELEASE_TABLET | Freq: Every day | ORAL | Status: DC
  Administered 2017-10-08: 81 mg via ORAL
  Filled 2017-10-07: qty 1

## 2017-10-07 MED ORDER — MORPHINE SULFATE (PF) 2 MG/ML IV SOLN
0.5000 mg | INTRAVENOUS | Status: DC | PRN
  Administered 2017-10-08: 0.5 mg via INTRAVENOUS
  Filled 2017-10-07: qty 1

## 2017-10-07 MED ORDER — BUDESONIDE 0.25 MG/2ML IN SUSP
0.2500 mg | Freq: Two times a day (BID) | RESPIRATORY_TRACT | Status: DC
  Administered 2017-10-08: 0.25 mg via RESPIRATORY_TRACT
  Filled 2017-10-07: qty 2

## 2017-10-07 MED ORDER — PREDNISONE 20 MG PO TABS
60.0000 mg | ORAL_TABLET | Freq: Once | ORAL | Status: AC
  Administered 2017-10-07: 60 mg via ORAL
  Filled 2017-10-07: qty 3

## 2017-10-07 MED ORDER — ENOXAPARIN SODIUM 40 MG/0.4ML ~~LOC~~ SOLN
40.0000 mg | SUBCUTANEOUS | Status: DC
  Filled 2017-10-07: qty 0.4

## 2017-10-07 MED ORDER — IOPAMIDOL (ISOVUE-370) INJECTION 76%
INTRAVENOUS | Status: AC
  Administered 2017-10-07: 100 mL via INTRAVENOUS
  Filled 2017-10-07: qty 100

## 2017-10-07 MED ORDER — ALBUTEROL SULFATE (2.5 MG/3ML) 0.083% IN NEBU
5.0000 mg | INHALATION_SOLUTION | Freq: Once | RESPIRATORY_TRACT | Status: AC
  Administered 2017-10-07: 5 mg via RESPIRATORY_TRACT
  Filled 2017-10-07: qty 6

## 2017-10-07 MED ORDER — METHYLPREDNISOLONE SODIUM SUCC 40 MG IJ SOLR
40.0000 mg | Freq: Two times a day (BID) | INTRAMUSCULAR | Status: DC
  Filled 2017-10-07: qty 1

## 2017-10-07 MED ORDER — POTASSIUM CHLORIDE 10 MEQ/100ML IV SOLN
10.0000 meq | Freq: Once | INTRAVENOUS | Status: AC
  Administered 2017-10-07: 10 meq via INTRAVENOUS
  Filled 2017-10-07: qty 100

## 2017-10-07 MED ORDER — MORPHINE SULFATE (PF) 4 MG/ML IV SOLN
4.0000 mg | Freq: Once | INTRAVENOUS | Status: AC
  Administered 2017-10-07: 4 mg via INTRAVENOUS
  Filled 2017-10-07: qty 1

## 2017-10-07 MED ORDER — ATORVASTATIN CALCIUM 40 MG PO TABS
40.0000 mg | ORAL_TABLET | Freq: Every day | ORAL | Status: DC
  Administered 2017-10-07: 40 mg via ORAL
  Filled 2017-10-07: qty 1

## 2017-10-07 MED ORDER — IPRATROPIUM BROMIDE 0.02 % IN SOLN
0.5000 mg | RESPIRATORY_TRACT | Status: DC
  Administered 2017-10-08: 0.5 mg via RESPIRATORY_TRACT
  Filled 2017-10-07: qty 2.5

## 2017-10-07 MED ORDER — MONTELUKAST SODIUM 10 MG PO TABS
10.0000 mg | ORAL_TABLET | Freq: Every day | ORAL | Status: DC
  Administered 2017-10-07: 10 mg via ORAL
  Filled 2017-10-07: qty 1

## 2017-10-07 MED ORDER — GUAIFENESIN-DM 100-10 MG/5ML PO SYRP
5.0000 mL | ORAL_SOLUTION | ORAL | Status: DC | PRN
  Administered 2017-10-07: 5 mL via ORAL
  Filled 2017-10-07: qty 5

## 2017-10-07 MED ORDER — CARVEDILOL 6.25 MG PO TABS
6.2500 mg | ORAL_TABLET | Freq: Two times a day (BID) | ORAL | Status: DC
  Administered 2017-10-08 (×2): 6.25 mg via ORAL
  Filled 2017-10-07 (×2): qty 1

## 2017-10-07 MED ORDER — ALBUTEROL SULFATE (2.5 MG/3ML) 0.083% IN NEBU
2.5000 mg | INHALATION_SOLUTION | RESPIRATORY_TRACT | Status: DC
  Administered 2017-10-08: 2.5 mg via RESPIRATORY_TRACT
  Filled 2017-10-07: qty 3

## 2017-10-07 MED ORDER — POTASSIUM CHLORIDE CRYS ER 20 MEQ PO TBCR
40.0000 meq | EXTENDED_RELEASE_TABLET | Freq: Once | ORAL | Status: AC
  Administered 2017-10-07: 40 meq via ORAL
  Filled 2017-10-07: qty 2

## 2017-10-07 MED ORDER — GABAPENTIN 300 MG PO CAPS
300.0000 mg | ORAL_CAPSULE | Freq: Three times a day (TID) | ORAL | Status: DC
  Administered 2017-10-07 – 2017-10-08 (×3): 300 mg via ORAL
  Filled 2017-10-07 (×2): qty 1
  Filled 2017-10-07: qty 3

## 2017-10-07 MED ORDER — ACETAMINOPHEN 325 MG PO TABS: 650.0000 mg | ORAL_TABLET | Freq: Four times a day (QID) | ORAL | Status: DC | PRN

## 2017-10-07 MED ORDER — IPRATROPIUM BROMIDE 0.02 % IN SOLN
0.5000 mg | Freq: Once | RESPIRATORY_TRACT | Status: AC
  Administered 2017-10-07: 0.5 mg via RESPIRATORY_TRACT
  Filled 2017-10-07: qty 2.5

## 2017-10-07 MED ORDER — ACETAMINOPHEN 650 MG RE SUPP: 650.0000 mg | Freq: Four times a day (QID) | RECTAL | Status: DC | PRN

## 2017-10-07 MED ORDER — HYDRALAZINE HCL 20 MG/ML IJ SOLN
10.0000 mg | INTRAMUSCULAR | Status: DC | PRN
  Administered 2017-10-08 (×2): 10 mg via INTRAVENOUS
  Filled 2017-10-07 (×2): qty 1

## 2017-10-07 MED ORDER — INSULIN GLARGINE 100 UNIT/ML ~~LOC~~ SOLN
5.0000 [IU] | Freq: Two times a day (BID) | SUBCUTANEOUS | Status: DC
  Administered 2017-10-08 (×2): 5 [IU] via SUBCUTANEOUS
  Filled 2017-10-07 (×3): qty 0.05

## 2017-10-07 MED ORDER — POTASSIUM CHLORIDE CRYS ER 20 MEQ PO TBCR
60.0000 meq | EXTENDED_RELEASE_TABLET | Freq: Once | ORAL | Status: AC
  Administered 2017-10-07: 60 meq via ORAL
  Filled 2017-10-07: qty 3

## 2017-10-07 MED ORDER — AMLODIPINE BESYLATE 5 MG PO TABS
5.0000 mg | ORAL_TABLET | Freq: Every day | ORAL | Status: DC
  Administered 2017-10-08: 5 mg via ORAL
  Filled 2017-10-07: qty 1

## 2017-10-07 MED ORDER — HYDRALAZINE HCL 25 MG PO TABS
100.0000 mg | ORAL_TABLET | Freq: Three times a day (TID) | ORAL | Status: DC
  Administered 2017-10-07 – 2017-10-08 (×3): 100 mg via ORAL
  Filled 2017-10-07 (×3): qty 4

## 2017-10-07 NOTE — Progress Notes (Signed)
Pt admitted from the ED per stretcher assisted by the NT. Alert oriented x 4, denies chest pain but complains of upper arm pain more of shoulder pain with pain scale 8/10 , BP was 202/96, denies nausea and vomiting, on 4L of O2. MD updated and ordered 10 mg IV hydralazine and Morphine IV. Pt was placed on telemetry box 30 and was verified another Therapist, sports. Pt oriented to call bell and room, educated about patient fall prevention safety plan with understanding. Will continue to monitor pt.   10/07/17 2308  Vitals  Temp 98.1 F (36.7 C)  Temp Source Oral  BP (!) 202/96  BP Location Left Arm  BP Method Automatic  Patient Position (if appropriate) Sitting  Resp 18  Oxygen Therapy  SpO2 97 %  O2 Device Nasal Cannula  O2 Flow Rate (L/min) 4 L/min  Pain Assessment  Pain Assessment 0-10  Pain Score 8  Faces Pain Scale 2  Pain Type Acute pain  Pain Location Arm  Pain Orientation Right  Pain Descriptors / Indicators Sore  Pain Frequency Constant  Pain Onset On-going  Pain Intervention(s) RN made aware  Multiple Pain Sites No  Complaints & Interventions  Complains of Anxiety  Height and Weight  Height 5' 4.5" (1.638 m)  Weight 79.3 kg (174 lb 14.4 oz)  Type of Scale Used Standing  Type of Weight Actual  BSA (Calculated - sq m) 1.9 sq meters  BMI (Calculated) 29.57  Weight in (lb) to have BMI = 25 147.6

## 2017-10-07 NOTE — ED Notes (Signed)
Patient eating at this time. Will ambulate patient once she is done. Also waiting to give pain med once she is ambulated in case it makes patient drowsy.

## 2017-10-07 NOTE — ED Notes (Signed)
Carb modified plate ordered for patient.

## 2017-10-07 NOTE — ED Provider Notes (Signed)
[facility] EMERGENCY DEPARTMENT Provider Note   CSN: [id number] Arrival date & time: 10/07/17  1512     History   Chief Complaint Chief Complaint  Patient presents with  . Cough  . Shortness of Breath  . Fever    HPI Brenda Sims is a 62 y.o. female.  HPI   62 year old female with history of hypertension, diabetes, chronic kidney disease presenting complaining of cough and shortness of breath. Patient initially admitted to the hospital on January 17 for complaints of shortness of breath and cough.  She was admitted for the symptoms and developed cardiopulmonary arrest with acute hypoxic respiratory failure as well as an NSTEMI.  She underwent cardiac cath on 1/30 that shows moderate nonobstructive CAD, moderate LV dysfunction and an EF of 35-45%.  Patient subsequently discharged on 1/31.  She was told that she has pneumonia however she was not discharged home with any kind of antibiotic.  She continues to endorse having shortness of breath worse with exertion, wheezing, and cough productive with white foam.  Symptom has been persistent and has not improved.  Symptoms got progressively worse prompting her to come here today.  She endorsed subjective fever at home.  She denies nausea vomiting diarrhea, diaphoresis, but does endorse feeling weak and dizzy.  She is not on home oxygen.  She report remote history of asthma.  She denies any recent leg swelling or calf pain denies any prior history of PE.  Past Medical History:  Diagnosis Date  . CKD (chronic kidney disease)   . Diabetes mellitus without complication ([place])   . Hypertension     Patient Active Problem List   Diagnosis Date Noted  . Diabetes mellitus type 2 in nonobese (HCC)   . Leukocytosis   . Acute blood loss anemia   . Seizures ([place])   . Coronary artery disease involving native coronary artery of native heart with angina pectoris ([place]) 09/05/2017  . Cardiac arrest ([place]) 09/03/2017  . Cardiopulmonary  arrest ([place])   . Acute encephalopathy   . Acute respiratory failure with hypoxia ([place])   . Acute on chronic combined systolic and diastolic CHF (congestive heart failure) ([place]) 08/29/2017  . Essential hypertension 08/29/2017  . Type 1 diabetes mellitus without complication ([place]) 96/28/3662  . Tobacco abuse 08/29/2017  . Acute pulmonary edema (HCC)   . CKD (chronic kidney disease)   . Dyspnea on exertion     Past Surgical History:  Procedure Laterality Date  . APPENDECTOMY    . BACK SURGERY    . LEFT HEART CATH AND CORONARY ANGIOGRAPHY N/A 09/11/2017   Procedure: LEFT HEART CATH AND CORONARY ANGIOGRAPHY;  Surgeon: Martinique, Peter M, MD;  Location: [facility];  Service: Cardiovascular;  Laterality: N/A;  . TOTAL KNEE ARTHROPLASTY Right 2007    OB History    No data available       Home Medications    Prior to Admission medications   Medication Sig Start Date End Date Taking? Authorizing Provider  acetaminophen (TYLENOL) 500 MG tablet Take 1,000 mg by mouth every 6 (six) hours as needed for headache (pain).   Yes [provider]  amLODipine (NORVASC) 5 MG tablet Take 1 tablet (5 mg total) by mouth daily. 09/13/17  Yes Rai, Ripudeep K, MD  atorvastatin (LIPITOR) 40 MG tablet Take 1 tablet (40 mg total) by mouth at bedtime. 09/12/17  Yes Rai, Ripudeep K, MD  carvedilol (COREG) 6.25 MG tablet Take 1 tablet (6.25 mg total) by  mouth 2 (two) times daily with a meal. 09/12/17  Yes Rai, Ripudeep K, MD  furosemide (LASIX) 20 MG tablet Take 1 tablet (20 mg total) by mouth daily. 09/12/17  Yes Rai, Ripudeep K, MD  gabapentin (NEURONTIN) 300 MG capsule Take 300 mg by mouth 3 (three) times daily. 09/12/17  Yes [provider]  hydrALAZINE (APRESOLINE) 100 MG tablet Take 1 tablet (100 mg total) by mouth 3 (three) times daily. 09/12/17  Yes Rai, Ripudeep K, MD  insulin glargine (LANTUS) 100 unit/mL SOPN Inject 20 Units into the skin 2 (two) times daily.   Yes [provider]  losartan (COZAAR) 25 MG tablet Take 1 tablet (25 mg total) by mouth daily. 09/12/17  Yes Rai, Ripudeep K, MD  aspirin EC 81 MG tablet Take 81 mg by mouth daily.    [provider]  cetirizine (ZYRTEC) 10 MG tablet Take 10 mg by mouth daily.    [provider]  cloNIDine (CATAPRES - DOSED IN MG/24 HR) 0.2 mg/24hr patch Place 0.2 mg onto the skin once a week.    [provider]  gabapentin (NEURONTIN) 100 MG capsule Take 1 capsule (100 mg total) by mouth 3 (three) times daily. Patient not taking: Reported on 10/07/2017 09/12/17   Rai, Vernelle Emerald, MD  insulin aspart (NOVOLOG) 100 UNIT/ML injection Inject 20 Units into the skin 3 (three) times daily before meals.     [provider]  metFORMIN (GLUCOPHAGE) 1000 MG tablet Take 1,000 mg by mouth 2 (two) times daily with a meal.    [provider]  montelukast (SINGULAIR) 10 MG tablet Take 10 mg by mouth at bedtime.    [provider]  polyethylene glycol (MIRALAX / GLYCOLAX) packet Take 17 g by mouth daily as needed for moderate constipation. 09/12/17   Mendel Corning, MD    Family History History reviewed. No pertinent family history.  Social History Social History   Tobacco Use  . Smoking status: Never Smoker  . Smokeless tobacco: Never Used  Substance Use Topics  . Alcohol use: No    Frequency: Never  . Drug use: No     Allergies   Eggs or egg-derived products; Azithromycin; and Cherry   Review of Systems Review of Systems  All other systems reviewed and are negative.    Physical Exam Updated Vital Signs BP (!) 181/87 (BP Location: Left Arm)   Pulse 80   Temp 98.5 F (36.9 C) (Oral)   Resp 18   Ht 5' 4.5" (1.638 m)   SpO2 95%   BMI 30.67 kg/m   Physical Exam  Constitutional: She appears well-developed and well-nourished. No distress.  HENT:  Head: Atraumatic.  Mouth/Throat: No oropharyngeal exudate or posterior oropharyngeal edema.  Eyes: Conjunctivae are  normal.  Neck: Neck supple. No JVD present.  Cardiovascular: Normal rate and regular rhythm.  Pulmonary/Chest: Effort normal. She has wheezes. She has no rhonchi. She has no rales.  Abdominal: Soft. There is no tenderness.  Musculoskeletal:       Right lower leg: She exhibits no edema.       Left lower leg: She exhibits no edema.  Neurological: She is alert.  Skin: No rash noted.  Psychiatric: She has a normal mood and affect.  Nursing note and vitals reviewed.    ED Treatments / Results  Labs (all labs ordered are listed, but only abnormal results are displayed) Labs Reviewed  BASIC METABOLIC PANEL - Abnormal; Notable for the following components:  Result Value   Potassium 2.5 (*)    Glucose, Bld 47 (*)    Creatinine, Ser 1.29 (*)    GFR calc non Af Amer 44 (*)    GFR calc Af Amer 51 (*)    All other components within normal limits  CBC - Abnormal; Notable for the following components:   Hemoglobin 10.5 (*)    HCT 33.0 (*)    MCV 67.9 (*)    MCH 21.6 (*)    RDW 23.7 (*)    All other components within normal limits  BRAIN NATRIURETIC PEPTIDE  MAGNESIUM  I-STAT TROPONIN, ED  I-STAT CG4 LACTIC ACID, ED  I-STAT CG4 LACTIC ACID, ED  CBG MONITORING, ED    EKG  EKG Interpretation  Date/Time:  Monday October 07 2017 15:17:45 EST Ventricular Rate:  82 PR Interval:  136 QRS Duration: 90 QT Interval:  340 QTC Calculation: 397 R Axis:   -73 Text Interpretation:  Normal sinus rhythm Left axis deviation Anterior infarct , age undetermined Abnormal ECG No significant change since last tracing Confirmed by Dorie Rank [phone]) on 10/07/2017 6:32:23 PM       Radiology Dg Chest 2 View  Result Date: 10/07/2017 CLINICAL DATA:  Shortness of breath and cough EXAM: CHEST  2 VIEW COMPARISON:  09/06/2017 FINDINGS: Moderate cardiac enlargement. There is aortic atherosclerosis. No pleural effusion or edema. No airspace opacities. Mild spondylosis noted within the thoracic spine.  IMPRESSION: 1. No airspace consolidation identified. 2. Cardiac enlargement and Aortic Atherosclerosis (ICD10-I70.0). Electronically Signed   By: Kerby Moors M.D.   On: 10/07/2017 16:10   Ct Angio Chest Pe W And/or Wo Contrast  Result Date: 10/07/2017 CLINICAL DATA:  Shortness of breath EXAM: CT ANGIOGRAPHY CHEST WITH CONTRAST TECHNIQUE: Multidetector CT imaging of the chest was performed using the standard protocol during bolus administration of intravenous contrast. Multiplanar CT image reconstructions and MIPs were obtained to evaluate the vascular anatomy. CONTRAST:  73 ml ISOVUE-370 IOPAMIDOL (ISOVUE-370) INJECTION 76% COMPARISON:  Radiograph 10/07/2017, CT chest 09/01/2016 FINDINGS: Cardiovascular: Satisfactory opacification of the pulmonary arteries to the segmental level. No evidence of pulmonary embolism. Nonaneurysmal aorta. Moderate-to-marked aortic atherosclerosis. Coronary artery calcification. Cardiomegaly. No large pericardial effusion Mediastinum/Nodes: Midline trachea. Hypodensity at the right lobe of thyroid thought artifactual given normal appearance on recent comparison. Surgical clips at the left lobe. Prominent right paratracheal lymph node measuring up to 15 mm. Lungs/Pleura: Decreased ground-glass density. Mild residual ground-glass opacity within the bilateral lungs. No pleural effusion. No pneumothorax. Upper Abdomen: No acute abnormality. Nodular fullness of the left adrenal gland. Musculoskeletal: No chest wall abnormality. No acute or significant osseous findings. Review of the MIP images confirms the above findings. IMPRESSION: 1. Negative for acute pulmonary embolus 2. Overall decreased ground-glass density within the lung fields. There is residual mild mosaic/ground-glass density throughout the lung suspected to represent residual pulmonary edema. There is cardiomegaly. Aortic Atherosclerosis (ICD10-I70.0). Electronically Signed   By: Donavan Foil M.D.   On: 10/07/2017 19:28      Procedures Procedures (including critical care time)  Medications Ordered in ED Medications  potassium chloride SA (K-DUR,KLOR-CON) CR tablet 60 mEq (not administered)  albuterol (PROVENTIL) (2.5 MG/3ML) 0.083% nebulizer solution 5 mg (5 mg Nebulization Given 10/07/17 1913)  ipratropium (ATROVENT) nebulizer solution 0.5 mg (0.5 mg Nebulization Given 10/07/17 1913)  potassium chloride SA (K-DUR,KLOR-CON) CR tablet 40 mEq (40 mEq Oral Given 10/07/17 1912)  potassium chloride 10 mEq in 100 mL IVPB (0 mEq Intravenous Stopped 10/07/17 2024)  iopamidol (ISOVUE-370) 76 % injection (100 mLs Intravenous Contrast Given 10/07/17 1847)  morphine 4 MG/ML injection 4 mg (4 mg Intravenous Given 10/07/17 2029)  predniSONE (DELTASONE) tablet 60 mg (60 mg Oral Given 10/07/17 2029)     Initial Impression / Assessment and Plan / ED Course  I have reviewed the triage vital signs and the nursing notes.  Pertinent labs & imaging results that were available during my care of the patient were reviewed by me and considered in my medical decision making (see chart for details).     BP (!) 185/87   Pulse 80   Temp 98.5 F (36.9 C) (Oral)   Resp 18   Ht 5' 4.5" (1.638 m)   SpO2 100%   BMI 30.67 kg/m    Final Clinical Impressions(s) / ED Diagnoses   Final diagnoses:  Hypoglycemia  Hypoxia    ED Discharge Orders    None     6:28 PM Patient report persistent shortness of breath and productive cough as well as wheezing since being discharged from the hospital 3 weeks ago for similar symptoms.  She did suffer a cardiopulmonary arrest during hospitalization require a code activation and did regain ROSC after approximately 1 minute.  She was also intubated afterward.  She endorsed subjective fever however she is currently afebrile initial chest x-ray without evidence of pneumonia, normal lactic acid, and normal WBC.  She is hypokalemic with a potassium of 2.5, low U waves on EKG and EKG is similar to prior.   She has a normal troponin.  Potassium segmentation given.  She is wheezing, and is hypoxic currently set as 88% on room air.  Oxygen level improved to 95% with 2 L of supplemental oxygen.  Will provide duo nebs.  Given recent hospitalization, I will obtain chest CT angiogram to assess for potential PE.  7:55 PM Chest CT angiogram shown no evidence of PE.  Patient has normal lactic acid and chest x-ray without signs of pneumonia.  When ambulating, her O2 sats dropped down to 86% on room air.  She was symptomatic.  Patient will benefit from admission for further management of her condition.  Patient received steroid suspect COPD/CHF exacerbation.  9:00 PM Appreciate consultation from [facility] Dr. Hal Hope who agrees to see and admit pt.  He recommend giving patient an additional 37mEq of potassium and check magnesium level.     Domenic Moras, PA-C 10/07/17 2101    Dorie Rank, MD 10/08/17 [phone]

## 2017-10-07 NOTE — H&P (Signed)
History and Physical    Brenda Sims VEH:[id number] DOB: Sep 09, 1955 DOA: 10/07/2017  PCP: Patient, No Pcp Per  Patient coming from: Home.  Chief Complaint: Shortness of breath.  HPI: Brenda Sims is a 62 y.o. female with history of recent admission for cardiac arrest in the setting of acute hypoxic respiratory failure likely from pulmonary edema and possible pneumonia cardiac cath showed EF of 35-45% with history of hypertension, diabetes mellitus type 2 presents to the ER with complaints of increasing shortness of breath over the last few days.  Shortness of breath at present even at rest with some productive cough and wheezing.  Denies any chest pain.  Denies any nausea or vomiting.  ED Course: In the ER patient is found to be diffusely wheezing.  BNP was around 400.  Troponin was negative.  EKG shows normal sinus rhythm with poor R wave progression.  CT angiogram of the chest was negative for PE shows improving mosaic pattern with possible congestion.  Patient was started on nebulizer treatment and admitted for further management of acute bronchitis.  The ER patient's blood sugar was found to be low and was given juice following which blood sugar increased to 200.  Patient complains of increasing right upper extremity pain since discharge.  Has had cardiac cath in the past.  No obvious swelling.    Review of Systems: As per HPI, rest all negative.   Past Medical History:  Diagnosis Date  . CKD (chronic kidney disease)   . Diabetes mellitus without complication ([place])   . Hypertension     Past Surgical History:  Procedure Laterality Date  . APPENDECTOMY    . BACK SURGERY    . LEFT HEART CATH AND CORONARY ANGIOGRAPHY N/A 09/11/2017   Procedure: LEFT HEART CATH AND CORONARY ANGIOGRAPHY;  Surgeon: Martinique, Peter M, MD;  Location: [facility];  Service: Cardiovascular;  Laterality: N/A;  . TOTAL KNEE ARTHROPLASTY Right 2007     reports that  has never smoked. she has never used  smokeless tobacco. She reports that she does not drink alcohol or use drugs.  Allergies  Allergen Reactions  . Eggs Or Egg-Derived Products Shortness Of Breath and Rash  . Azithromycin Swelling    Facial/throat swelling  . Cherry Swelling    Facial swelling    Family History  Problem Relation Age of Onset  . Hypertension Other     Prior to Admission medications   Medication Sig Start Date End Date Taking? Authorizing Provider  acetaminophen (TYLENOL) 500 MG tablet Take 1,000 mg by mouth every 6 (six) hours as needed for headache (pain).   Yes [provider]  amLODipine (NORVASC) 5 MG tablet Take 1 tablet (5 mg total) by mouth daily. 09/13/17  Yes Rai, Ripudeep K, MD  atorvastatin (LIPITOR) 40 MG tablet Take 1 tablet (40 mg total) by mouth at bedtime. 09/12/17  Yes Rai, Ripudeep K, MD  carvedilol (COREG) 6.25 MG tablet Take 1 tablet (6.25 mg total) by mouth 2 (two) times daily with a meal. 09/12/17  Yes Rai, Ripudeep K, MD  CLONIDINE HCL PO Take 1 tablet by mouth 2 (two) times daily.   Yes [provider]  furosemide (LASIX) 20 MG tablet Take 1 tablet (20 mg total) by mouth daily. 09/12/17  Yes Rai, Ripudeep K, MD  gabapentin (NEURONTIN) 300 MG capsule Take 300 mg by mouth 3 (three) times daily. 09/12/17  Yes [provider]  hydrALAZINE (APRESOLINE) 100 MG tablet Take 1 tablet (100  mg total) by mouth 3 (three) times daily. 09/12/17  Yes Rai, Ripudeep K, MD  insulin glargine (LANTUS) 100 unit/mL SOPN Inject 20 Units into the skin 2 (two) times daily.   Yes [provider]  losartan (COZAAR) 25 MG tablet Take 1 tablet (25 mg total) by mouth daily. 09/12/17  Yes Rai, Ripudeep K, MD  METFORMIN HCL PO Take 1 tablet by mouth 2 (two) times daily.   Yes [provider]  POTASSIUM PO Take 1 tablet by mouth daily.   Yes [provider]  aspirin EC 81 MG tablet Take 81 mg by mouth daily.    [provider]  gabapentin (NEURONTIN) 100 MG  capsule Take 1 capsule (100 mg total) by mouth 3 (three) times daily. Patient not taking: Reported on 10/07/2017 09/12/17   Rai, Vernelle Emerald, MD  montelukast (SINGULAIR) 10 MG tablet Take 10 mg by mouth at bedtime.    [provider]  polyethylene glycol (MIRALAX / GLYCOLAX) packet Take 17 g by mouth daily as needed for moderate constipation. Patient not taking: Reported on 10/07/2017 09/12/17   Mendel Corning, MD    Physical Exam: Vitals:   10/07/17 2115 10/07/17 2130 10/07/17 2145 10/07/17 2200  BP: (!) 152/68 (!) 177/84 (!) 168/70 (!) 159/53  Pulse: 85 81 79 78  Resp: (!) 25 (!) 24 (!) 24 (!) 22  Temp:      TempSrc:      SpO2: 99% 98% 97% 97%  Height:          Constitutional: Moderately built and nourished. Vitals:   10/07/17 2115 10/07/17 2130 10/07/17 2145 10/07/17 2200  BP: (!) 152/68 (!) 177/84 (!) 168/70 (!) 159/53  Pulse: 85 81 79 78  Resp: (!) 25 (!) 24 (!) 24 (!) 22  Temp:      TempSrc:      SpO2: 99% 98% 97% 97%  Height:       Eyes: Anicteric no pallor. ENMT: No discharge from the ears eyes nose or mouth. Neck: No JVD appreciated no mass felt. Respiratory: Bilateral expiratory wheeze and no crepitations. Cardiovascular: S1-S2 heard no murmurs appreciated. Abdomen: Soft nontender bowel sounds present. Musculoskeletal: No edema.  No joint effusion. Skin: No rash.  Skin appears warm. Neurologic: Alert awake oriented to time place and person.  Moves all extremities. Psychiatric: Appears normal.  Normal affect.   Labs on Admission: I have personally reviewed following labs and imaging studies  CBC: Recent Labs  Lab 10/07/17 1525  WBC 10.1  HGB 10.5*  HCT 33.0*  MCV 67.9*  PLT 409   Basic Metabolic Panel: Recent Labs  Lab 10/07/17 1525  NA 143  K 2.5*  CL 101  CO2 28  GLUCOSE 47*  BUN 12  CREATININE 1.29*  CALCIUM 9.0  MG 2.0   GFR: CrCl cannot be calculated (Unknown ideal weight.). Liver Function Tests: No results for input(s): AST,  ALT, ALKPHOS, BILITOT, PROT, ALBUMIN in the last 168 hours. No results for input(s): LIPASE, AMYLASE in the last 168 hours. No results for input(s): AMMONIA in the last 168 hours. Coagulation Profile: No results for input(s): INR, PROTIME in the last 168 hours. Cardiac Enzymes: No results for input(s): CKTOTAL, CKMB, CKMBINDEX, TROPONINI in the last 168 hours. BNP (last 3 results) No results for input(s): PROBNP in the last 8760 hours. HbA1C: No results for input(s): HGBA1C in the last 72 hours. CBG: Recent Labs  Lab 10/07/17 1758 10/07/17 2159  GLUCAP 72 213*   Lipid  Profile: No results for input(s): CHOL, HDL, LDLCALC, TRIG, CHOLHDL, LDLDIRECT in the last 72 hours. Thyroid Function Tests: No results for input(s): TSH, T4TOTAL, FREET4, T3FREE, THYROIDAB in the last 72 hours. Anemia Panel: No results for input(s): VITAMINB12, FOLATE, FERRITIN, TIBC, IRON, RETICCTPCT in the last 72 hours. Urine analysis:    Component Value Date/Time   COLORURINE YELLOW 08/30/2017 0159   APPEARANCEUR CLEAR 08/30/2017 0159   LABSPEC 1.010 08/30/2017 0159   PHURINE 5.0 08/30/2017 0159   GLUCOSEU 50 (A) 08/30/2017 0159   HGBUR NEGATIVE 08/30/2017 0159   BILIRUBINUR NEGATIVE 08/30/2017 0159   KETONESUR NEGATIVE 08/30/2017 0159   PROTEINUR 100 (A) 08/30/2017 0159   NITRITE NEGATIVE 08/30/2017 0159   LEUKOCYTESUR NEGATIVE 08/30/2017 0159   Sepsis Labs: @LABRCNTIP (procalcitonin:4,lacticidven:4) )No results found for this or any previous visit (from the past 240 hour(s)).   Radiological Exams on Admission: Dg Chest 2 View  Result Date: 10/07/2017 CLINICAL DATA:  Shortness of breath and cough EXAM: CHEST  2 VIEW COMPARISON:  09/06/2017 FINDINGS: Moderate cardiac enlargement. There is aortic atherosclerosis. No pleural effusion or edema. No airspace opacities. Mild spondylosis noted within the thoracic spine. IMPRESSION: 1. No airspace consolidation identified. 2. Cardiac enlargement and Aortic  Atherosclerosis (ICD10-I70.0). Electronically Signed   By: Kerby Moors M.D.   On: 10/07/2017 16:10   Ct Angio Chest Pe W And/or Wo Contrast  Result Date: 10/07/2017 CLINICAL DATA:  Shortness of breath EXAM: CT ANGIOGRAPHY CHEST WITH CONTRAST TECHNIQUE: Multidetector CT imaging of the chest was performed using the standard protocol during bolus administration of intravenous contrast. Multiplanar CT image reconstructions and MIPs were obtained to evaluate the vascular anatomy. CONTRAST:  73 ml ISOVUE-370 IOPAMIDOL (ISOVUE-370) INJECTION 76% COMPARISON:  Radiograph 10/07/2017, CT chest 09/01/2016 FINDINGS: Cardiovascular: Satisfactory opacification of the pulmonary arteries to the segmental level. No evidence of pulmonary embolism. Nonaneurysmal aorta. Moderate-to-marked aortic atherosclerosis. Coronary artery calcification. Cardiomegaly. No large pericardial effusion Mediastinum/Nodes: Midline trachea. Hypodensity at the right lobe of thyroid thought artifactual given normal appearance on recent comparison. Surgical clips at the left lobe. Prominent right paratracheal lymph node measuring up to 15 mm. Lungs/Pleura: Decreased ground-glass density. Mild residual ground-glass opacity within the bilateral lungs. No pleural effusion. No pneumothorax. Upper Abdomen: No acute abnormality. Nodular fullness of the left adrenal gland. Musculoskeletal: No chest wall abnormality. No acute or significant osseous findings. Review of the MIP images confirms the above findings. IMPRESSION: 1. Negative for acute pulmonary embolus 2. Overall decreased ground-glass density within the lung fields. There is residual mild mosaic/ground-glass density throughout the lung suspected to represent residual pulmonary edema. There is cardiomegaly. Aortic Atherosclerosis (ICD10-I70.0). Electronically Signed   By: Donavan Foil M.D.   On: 10/07/2017 19:28    EKG: Independently reviewed.  Normal sinus rhythm with nonspecific ST-T  changes.  Assessment/Plan Principal Problem:   Acute respiratory failure with hypoxia (HCC) Active Problems:   Essential hypertension   Diabetes mellitus type 2 in nonobese (HCC)   Acute bronchitis   Chronic combined systolic and diastolic CHF (congestive heart failure) (HCC)   CKD (chronic kidney disease) stage 3, GFR 30-59 ml/min (HCC)   Hypokalemia   Normocytic normochromic anemia    1. Acute respiratory failure with hypoxia likely from acute bronchitis -check influenza PCR.  We will also check sputum cultures and urine for Legionella strep antigen for now we will keep patient on doxycycline and continue with steroids nebulizer treatment and Pulmicort. 2. Severe hypokalemia replace and recheck.  Check magnesium levels.  Hold Lasix  until potassium is corrected. 3. History of combined systolic and diastolic dysfunction recent cardiac cath showed EF of 35-45% -will resume Lasix once potassium is corrected.  Continue Cozaar. 4. Diabetes mellitus type 2 -was hypoglycemic in the ER.  Blood sugar improved with giving juice and eating supper.  Blood sugars increased to 200.  At this time I am decreasing patient's Lantus insulin dose from 20 units to 5 units.  Closely follow CBGs.  Patient is on sliding scale coverage.  Hold metformin while inpatient. 5. Right upper extremity pain has had recent cardiac cath in that hand.  No obvious swelling.  Will check Dopplers and also discussed with cardiologist. 6. Hypertension on Cozaar and amlodipine clonidine hydralazine and Coreg. 7. Nonobstructive CAD on statins aspirin and Coreg. 8. Chronic kidney disease stage III creatinine appears to be at baseline. 9. Anemia likely from chronic disease had recent transfusion.  Follow CBC.   DVT prophylaxis: Lovenox. Code Status: Full code. Family Communication: Discussed with patient. Disposition Plan: Home. Consults called: None. Admission status: Observation.   Rise Patience MD [facility] Pager [phone].  If 7PM-7AM, please contact night-coverage [email or web address] Password [facility]  10/07/2017, 10:34 PM

## 2017-10-07 NOTE — ED Notes (Signed)
Patient ambulated around nurses' station.  Oxygen starting off at 91% RA and dropped to 86% RA as patient continued to walk.

## 2017-10-07 NOTE — ED Notes (Signed)
Unsuccessful attempt at giving report to 3E. 

## 2017-10-07 NOTE — ED Notes (Signed)
Patient given turkey sandwich to eat

## 2017-10-07 NOTE — ED Triage Notes (Signed)
Pt reports recent hospital discharge following MI/cardiac arrest. Pt reports never feeling better since being discharged. Has productive cough with white sputum, sob and fever. spo2 89% on room air at triage.

## 2017-10-07 NOTE — ED Notes (Signed)
Orange juice given to patient per EDP

## 2017-10-08 ENCOUNTER — Observation Stay (HOSPITAL_BASED_OUTPATIENT_CLINIC_OR_DEPARTMENT_OTHER): Payer: Medicaid Other

## 2017-10-08 DIAGNOSIS — E162 Hypoglycemia, unspecified: Secondary | ICD-10-CM

## 2017-10-08 DIAGNOSIS — M79621 Pain in right upper arm: Secondary | ICD-10-CM

## 2017-10-08 DIAGNOSIS — I10 Essential (primary) hypertension: Secondary | ICD-10-CM | POA: Diagnosis not present

## 2017-10-08 DIAGNOSIS — J209 Acute bronchitis, unspecified: Secondary | ICD-10-CM

## 2017-10-08 DIAGNOSIS — E119 Type 2 diabetes mellitus without complications: Secondary | ICD-10-CM | POA: Diagnosis not present

## 2017-10-08 DIAGNOSIS — M79609 Pain in unspecified limb: Secondary | ICD-10-CM

## 2017-10-08 DIAGNOSIS — D649 Anemia, unspecified: Secondary | ICD-10-CM

## 2017-10-08 DIAGNOSIS — J9601 Acute respiratory failure with hypoxia: Secondary | ICD-10-CM | POA: Diagnosis not present

## 2017-10-08 DIAGNOSIS — N183 Chronic kidney disease, stage 3 (moderate): Secondary | ICD-10-CM | POA: Diagnosis not present

## 2017-10-08 DIAGNOSIS — R0902 Hypoxemia: Secondary | ICD-10-CM

## 2017-10-08 DIAGNOSIS — E876 Hypokalemia: Secondary | ICD-10-CM | POA: Diagnosis not present

## 2017-10-08 LAB — CBC
HEMATOCRIT: 32.7 % — AB (ref 36.0–46.0)
HEMOGLOBIN: 10.3 g/dL — AB (ref 12.0–15.0)
MCH: 21.6 pg — ABNORMAL LOW (ref 26.0–34.0)
MCHC: 31.5 g/dL (ref 30.0–36.0)
MCV: 68.6 fL — AB (ref 78.0–100.0)
Platelets: 296 10*3/uL (ref 150–400)
RBC: 4.77 MIL/uL (ref 3.87–5.11)
RDW: 23.5 % — AB (ref 11.5–15.5)
WBC: 10.2 10*3/uL (ref 4.0–10.5)

## 2017-10-08 LAB — GLUCOSE, CAPILLARY
GLUCOSE-CAPILLARY: 208 mg/dL — AB (ref 65–99)
GLUCOSE-CAPILLARY: 406 mg/dL — AB (ref 65–99)
GLUCOSE-CAPILLARY: 178 mg/dL — AB (ref 65–99)
Glucose-Capillary: 225 mg/dL — ABNORMAL HIGH (ref 65–99)

## 2017-10-08 LAB — BASIC METABOLIC PANEL
ANION GAP: 12 (ref 5–15)
BUN: 17 mg/dL (ref 6–20)
CHLORIDE: 104 mmol/L (ref 101–111)
CO2: 26 mmol/L (ref 22–32)
Calcium: 9 mg/dL (ref 8.9–10.3)
Creatinine, Ser: 1.17 mg/dL — ABNORMAL HIGH (ref 0.44–1.00)
GFR calc Af Amer: 57 mL/min — ABNORMAL LOW (ref >60)
GFR, EST NON AFRICAN AMERICAN: 49 mL/min — AB (ref >60)
GLUCOSE: 234 mg/dL — AB (ref 65–99)
POTASSIUM: 3 mmol/L — AB (ref 3.5–5.1)
Sodium: 142 mmol/L (ref 135–145)

## 2017-10-08 LAB — MAGNESIUM: Magnesium: 2 mg/dL (ref 1.7–2.4)

## 2017-10-08 LAB — INFLUENZA PANEL BY PCR (TYPE A & B)
INFLAPCR: NEGATIVE
INFLBPCR: NEGATIVE

## 2017-10-08 LAB — POTASSIUM: POTASSIUM: 3.3 mmol/L — AB (ref 3.5–5.1)

## 2017-10-08 LAB — TROPONIN I: Troponin I: <0.03 ng/mL (ref <0.03)

## 2017-10-08 LAB — STREP PNEUMONIAE URINARY ANTIGEN: Strep Pneumo Urinary Antigen: NEGATIVE

## 2017-10-08 MED ORDER — POTASSIUM CHLORIDE CRYS ER 20 MEQ PO TBCR
40.0000 meq | EXTENDED_RELEASE_TABLET | Freq: Once | ORAL | Status: AC
  Administered 2017-10-08: 40 meq via ORAL

## 2017-10-08 MED ORDER — LOSARTAN POTASSIUM 50 MG PO TABS: 50.0000 mg | ORAL_TABLET | Freq: Every day | ORAL | 0 refills | Status: DC

## 2017-10-08 MED ORDER — ALBUTEROL SULFATE (2.5 MG/3ML) 0.083% IN NEBU
2.5000 mg | INHALATION_SOLUTION | Freq: Two times a day (BID) | RESPIRATORY_TRACT | Status: DC
  Administered 2017-10-08: 2.5 mg via RESPIRATORY_TRACT
  Filled 2017-10-08: qty 3

## 2017-10-08 MED ORDER — DOXYCYCLINE HYCLATE 100 MG PO TABS: 100.0000 mg | ORAL_TABLET | Freq: Two times a day (BID) | ORAL | 0 refills | Status: DC

## 2017-10-08 MED ORDER — GUAIFENESIN-DM 100-10 MG/5ML PO SYRP: 5.0000 mL | ORAL_SOLUTION | ORAL | 0 refills | Status: DC | PRN

## 2017-10-08 MED ORDER — METHYLPREDNISOLONE SODIUM SUCC 40 MG IJ SOLR: 40.0000 mg | INTRAMUSCULAR | Status: DC

## 2017-10-08 MED ORDER — LOSARTAN POTASSIUM 50 MG PO TABS: 50.0000 mg | ORAL_TABLET | Freq: Every day | ORAL | Status: DC

## 2017-10-08 MED ORDER — POTASSIUM CHLORIDE CRYS ER 20 MEQ PO TBCR
40.0000 meq | EXTENDED_RELEASE_TABLET | Freq: Once | ORAL | Status: AC
  Administered 2017-10-08: 40 meq via ORAL
  Filled 2017-10-08: qty 2

## 2017-10-08 MED ORDER — DOXYCYCLINE HYCLATE 100 MG PO TABS: 100.0000 mg | ORAL_TABLET | Freq: Two times a day (BID) | ORAL | Status: DC

## 2017-10-08 MED ORDER — PREDNISONE 10 MG PO TABS: 10.0000 mg | ORAL_TABLET | Freq: Every day | ORAL | 0 refills | Status: DC

## 2017-10-08 MED ORDER — IPRATROPIUM BROMIDE 0.02 % IN SOLN
0.5000 mg | Freq: Two times a day (BID) | RESPIRATORY_TRACT | Status: DC
  Administered 2017-10-08: 0.5 mg via RESPIRATORY_TRACT
  Filled 2017-10-08: qty 2.5

## 2017-10-08 MED ORDER — SODIUM CHLORIDE 0.9 % IV SOLN
100.0000 mg | Freq: Two times a day (BID) | INTRAVENOUS | Status: DC
  Administered 2017-10-08: 100 mg via INTRAVENOUS
  Filled 2017-10-08 (×2): qty 100

## 2017-10-08 MED ORDER — POTASSIUM CHLORIDE 20 MEQ/15ML (10%) PO SOLN
40.0000 meq | Freq: Every day | ORAL | Status: DC
  Administered 2017-10-08: 40 meq via ORAL
  Filled 2017-10-08 (×2): qty 30

## 2017-10-08 MED ORDER — POTASSIUM CHLORIDE CRYS ER 20 MEQ PO TBCR: 20.0000 meq | EXTENDED_RELEASE_TABLET | Freq: Every day | ORAL | 0 refills | Status: DC

## 2017-10-08 MED FILL — ?DOXYCYCLINE 100MG TABLET: 100 | 5 days supply | Qty: 10 | Fill #0

## 2017-10-08 MED FILL — ROBAFEN-DM SYRUP: 100-10 | 5 days supply | Qty: 118 | Fill #0

## 2017-10-08 MED FILL — predniSONE 10 MG TABS: 10 | 4 days supply | Qty: 10 | Fill #0

## 2017-10-08 MED FILL — POTASSIUM CL ER 20 MEQ TAB: 20 | 5 days supply | Qty: 5 | Fill #0

## 2017-10-08 MED FILL — LOSARTAN POTASSIUM 50 MG TA: 50 | 30 days supply | Qty: 30 | Fill #0

## 2017-10-08 NOTE — Progress Notes (Signed)
Inpatient Diabetes Program Recommendations  AACE/ADA: New Consensus Statement on Inpatient Glycemic Control (2015)  Target Ranges:  Prepandial:   less than 140 mg/dL      Peak postprandial:   less than 180 mg/dL (1-2 hours)      Critically ill patients:  140 - 180 mg/dL   Lab Results  Component Value Date   GLUCAP 406 (H) 10/08/2017   HGBA1C 6.7 (H) 09/05/2017    Review of Glycemic Control  Needs insulin adjustment.  Inpatient Diabetes Program Recommendations:     Increase Lantus to 10 units bid Add Novolog 4 units tidwc for meal coverage insulin if post-prandials remain > 180 mg/dL.  Continue to follow.  Thank you. Lorenda Peck, RD, LDN, CDE Inpatient Diabetes Coordinator [phone]

## 2017-10-08 NOTE — Consult Note (Addendum)
Cardiology Consultation:   Patient ID: Brenda Sims; [id number]; 1956-01-24   Admit date: 10/07/2017 Date of Consult: 10/08/2017  Primary Care Provider: Patient, No Pcp Per Primary Cardiologist: Sinclair Grooms, MD  Primary Electrophysiologist:  N/A   Patient Profile:   Brenda Sims is a 62 y.o. female with a hx of nonobstructive CAD, HTN, HLD, DM II on insulin and CKD stage III who is being seen today for the evaluation of R arm pain after cath at the request of Dr. Hal Hope.  History of Present Illness:   [name] is a 62 yo female with PMH of nonobstructive CAD, HTN, DM II on insulin and CKD stage III.  Patient was admitted in January 2019 with cardiac arrest in the setting of acute respiratory failure.  She was treated for both pneumonia and heart failure.  Initial echocardiogram obtained on 08/30/2017 showed a normal EF 50-55%, grade 2 DD.  Follow-up limited echo obtained on 09/03/2017 however showed EF 45-50%, akinesis of the basal mid inferior myocardium, severe hypokinesis of the mid apical inferolateral myocardium.  Eventually underwent cardiac catheterization on 09/11/2017 which showed 70% ostial ramus lesion, 65% proximal left circumflex lesion, EF 35-45%, mildly elevated LVEDP.  She was eventually discharged on 20 mg daily of Lasix.  Patient returned to the hospital on 10/07/2017 with shortness of breath.  CT of the chest showed overall decreased groundglass density within the lung fields, residual mild mosaic/groundglass density throughout the lung suspected to represent residual pulmonary edema.  Patient received breathing treatment with improvement in the symptoms.  However she continued to complain of right upper extremity pain which has been ongoing and persistent since the previous cardiac catheterization.    Past Medical History:  Diagnosis Date  . CKD (chronic kidney disease)   . Diabetes mellitus without complication ([place])   . Hypertension     Past Surgical History:    Procedure Laterality Date  . APPENDECTOMY    . BACK SURGERY    . LEFT HEART CATH AND CORONARY ANGIOGRAPHY N/A 09/11/2017   Procedure: LEFT HEART CATH AND CORONARY ANGIOGRAPHY;  Surgeon: Martinique, Peter M, MD;  Location: [facility];  Service: Cardiovascular;  Laterality: N/A;  . TOTAL KNEE ARTHROPLASTY Right 2007     Home Medications:  Prior to Admission medications   Medication Sig Start Date End Date Taking? Authorizing Provider  acetaminophen (TYLENOL) 500 MG tablet Take 1,000 mg by mouth every 6 (six) hours as needed for headache (pain).   Yes [provider]  amLODipine (NORVASC) 5 MG tablet Take 1 tablet (5 mg total) by mouth daily. 09/13/17  Yes Rai, Ripudeep K, MD  atorvastatin (LIPITOR) 40 MG tablet Take 1 tablet (40 mg total) by mouth at bedtime. 09/12/17  Yes Rai, Ripudeep K, MD  carvedilol (COREG) 6.25 MG tablet Take 1 tablet (6.25 mg total) by mouth 2 (two) times daily with a meal. 09/12/17  Yes Rai, Ripudeep K, MD  CLONIDINE HCL PO Take 1 tablet by mouth 2 (two) times daily.   Yes [provider]  furosemide (LASIX) 20 MG tablet Take 1 tablet (20 mg total) by mouth daily. 09/12/17  Yes Rai, Ripudeep K, MD  gabapentin (NEURONTIN) 300 MG capsule Take 300 mg by mouth 3 (three) times daily. 09/12/17  Yes [provider]  hydrALAZINE (APRESOLINE) 100 MG tablet Take 1 tablet (100 mg total) by mouth 3 (three) times daily. 09/12/17  Yes Rai, Ripudeep K, MD  insulin glargine (LANTUS) 100 unit/mL SOPN Inject 20  Units into the skin 2 (two) times daily.   Yes [provider]  losartan (COZAAR) 25 MG tablet Take 1 tablet (25 mg total) by mouth daily. 09/12/17  Yes Rai, Ripudeep K, MD  metFORMIN (GLUCOPHAGE) 1000 MG tablet Take 1,000 mg by mouth 2 (two) times daily with a meal.   Yes [provider]  aspirin EC 81 MG tablet Take 81 mg by mouth daily.    [provider]  gabapentin (NEURONTIN) 100 MG capsule Take 1 capsule (100 mg total) by  mouth 3 (three) times daily. Patient not taking: Reported on 10/07/2017 09/12/17   Rai, Vernelle Emerald, MD  montelukast (SINGULAIR) 10 MG tablet Take 10 mg by mouth at bedtime.    [provider]  polyethylene glycol (MIRALAX / GLYCOLAX) packet Take 17 g by mouth daily as needed for moderate constipation. Patient not taking: Reported on 10/07/2017 09/12/17   Mendel Corning, MD    Inpatient Medications: Scheduled Meds: . albuterol  2.5 mg Nebulization BID  . amLODipine  5 mg Oral Daily  . aspirin EC  81 mg Oral Daily  . atorvastatin  40 mg Oral QHS  . budesonide (PULMICORT) nebulizer solution  0.25 mg Nebulization BID  . carvedilol  6.25 mg Oral BID WC  . enoxaparin (LOVENOX) injection  40 mg Subcutaneous Q24H  . gabapentin  300 mg Oral TID  . hydrALAZINE  100 mg Oral TID  . insulin aspart  0-9 Units Subcutaneous TID WC  . insulin glargine  5 Units Subcutaneous BID  . ipratropium  0.5 mg Nebulization BID  . losartan  25 mg Oral Daily  . [START ON 10/09/2017] methylPREDNISolone (SOLU-MEDROL) injection  40 mg Intravenous Q24H  . montelukast  10 mg Oral QHS  . potassium chloride  40 mEq Oral Daily   Continuous Infusions: . doxycycline (VIBRAMYCIN) IV Stopped (10/08/17 0830)   PRN Meds: acetaminophen **OR** acetaminophen, albuterol, guaiFENesin-dextromethorphan, hydrALAZINE, morphine injection  Allergies:    Allergies  Allergen Reactions  . Eggs Or Egg-Derived Products Shortness Of Breath and Rash  . Azithromycin Swelling    Facial/throat swelling  . Cherry Swelling    Facial swelling    Social History:   Social History   Socioeconomic History  . Marital status: Unknown    Spouse name: Not on file  . Number of children: Not on file  . Years of education: Not on file  . Highest education level: Not on file  Social Needs  . Financial resource strain: Not on file  . Food insecurity - worry: Not on file  . Food insecurity - inability: Not on file  . Transportation needs  - medical: Not on file  . Transportation needs - non-medical: Not on file  Occupational History  . Not on file  Tobacco Use  . Smoking status: Never Smoker  . Smokeless tobacco: Never Used  Substance and Sexual Activity  . Alcohol use: No    Frequency: Never  . Drug use: No  . Sexual activity: Not on file  Other Topics Concern  . Not on file  Social History Narrative  . Not on file    Family History:    Family History  Problem Relation Age of Onset  . Hypertension Other      ROS:  Please see the history of present illness.   All other ROS reviewed and negative.     Physical Exam/Data:   Vitals:   10/08/17 0905 10/08/17 0909 10/08/17 0950 10/08/17 1201  BP:   Marland Kitchen)  184/90 (!) 143/59  Pulse:    90  Resp:      Temp:    98.2 F (36.8 C)  TempSrc:    Oral  SpO2: 99% 99%  96%  Weight:      Height:        Intake/Output Summary (Last 24 hours) at 10/08/2017 1347 Last data filed at 10/08/2017 1314 Gross per 24 hour  Intake 1060 ml  Output 500 ml  Net 560 ml   Filed Weights   10/07/17 2300 10/07/17 2308 10/08/17 0639  Weight: 181 lb 7 oz (82.3 kg) 174 lb 14.4 oz (79.3 kg) 172 lb 6.4 oz (78.2 kg)   Body mass index is 29.14 kg/m.  General:  Well nourished, well developed, in no acute distress HEENT: normal Lymph: no adenopathy Neck: no JVD Endocrine:  No thryomegaly Vascular: No carotid bruits; FA pulses 2+ bilaterally without bruits  Cardiac:  normal S1, S2; RRR; no murmur Lungs:  Diminished breath sound bilaterally, no wheezing, rale on rhonchi Abd: soft, nontender, no hepatomegaly  Ext: no edema Musculoskeletal:  No deformities, BUE and BLE strength normal and equal Skin: warm and dry  Neuro:  CNs 2-12 intact, no focal abnormalities noted Psych:  Normal affect   EKG:  The EKG was personally reviewed and demonstrates:  NSR without significant ST-T wave changes Telemetry:  Telemetry was personally reviewed and demonstrates:  NSR without significant  ventricular ectopy  Relevant CV Studies:  Echo 09/03/2017 - Left ventricle: The cavity size was mildly dilated. There was   mild concentric hypertrophy. Systolic function was mildly   reduced. The estimated ejection fraction was in the range of 45%   to 50%. There is akinesis of the basal-midinferior myocardium.   There is severe hypokinesis of the mid-apicalinferolateral   myocardium. - Aortic valve: Mildly to moderately calcified annulus. Trileaflet;   mildly thickened, mildly calcified leaflets. - Mitral valve: Calcified annulus. Mild focal calcification of the   anterior leaflet. There was trivial regurgitation. - Left atrium: The atrium was mildly dilated. - Tricuspid valve: There was trivial regurgitation. - Impressions: mildly reduced LVF with EF 45-50% with basal   inferior AK and severe mid to apical inferolateral HK, mildly   calcified anterior MV leaflet with mild MR, moderately calcified   AV annulus with mild AVSC, mild LAE, trivial TR. Marland KitchenCompared to   study 08/30/2017, the inferolateral wall is now severely   hypokinetic and LVF appears to have declined some.  Impressions:  - mildly reduced LVF with EF 45-50% with basal inferior AK and   severe mid to apical inferolateral HK, mildly calcified anterior   MV leaflet with mild MR, moderately calcified AV annulus with   mild AVSC, mild LAE, trivial TR. Marland KitchenCompared to study 08/30/2017,   the inferolateral wall is now severely hypokinetic and LVF   appears to have declined some.    Cath 09/11/2017 Conclusion     Ost Ramus to Ramus lesion is 70% stenosed.  Prox Cx to Mid Cx lesion is 65% stenosed.  Mid RCA to Dist RCA lesion is 50% stenosed.  LV end diastolic pressure is mildly elevated.  There is moderate left ventricular systolic dysfunction.  The left ventricular ejection fraction is 35-45% by visual estimate.   1. Moderate nonobstructive CAD 2. Moderate LV dysfunction. 3. Mildly elevated LVEDP      Laboratory Data:  Chemistry Recent Labs  Lab 10/07/17 1525 10/08/17 0524  NA 143 142  K 2.5* 3.0*  CL 101 104  CO2 28 26  GLUCOSE 47* 234*  BUN 12 17  CREATININE 1.29* 1.17*  CALCIUM 9.0 9.0  GFRNONAA 44* 49*  GFRAA 51* 57*  ANIONGAP 14 12    No results for input(s): PROT, ALBUMIN, AST, ALT, ALKPHOS, BILITOT in the last 168 hours. Hematology Recent Labs  Lab 10/07/17 1525 10/08/17 0524  WBC 10.1 10.2  RBC 4.86 4.77  HGB 10.5* 10.3*  HCT 33.0* 32.7*  MCV 67.9* 68.6*  MCH 21.6* 21.6*  MCHC 31.8 31.5  RDW 23.7* 23.5*  PLT 327 296   Cardiac Enzymes Recent Labs  Lab 10/08/17 0524  TROPONINI <0.03    Recent Labs  Lab 10/07/17 1547  TROPIPOC 0.02    BNP Recent Labs  Lab 10/07/17 2030  BNP 419.8*    DDimer No results for input(s): DDIMER in the last 168 hours.  Radiology/Studies:  Dg Chest 2 View  Result Date: 10/07/2017 CLINICAL DATA:  Shortness of breath and cough EXAM: CHEST  2 VIEW COMPARISON:  09/06/2017 FINDINGS: Moderate cardiac enlargement. There is aortic atherosclerosis. No pleural effusion or edema. No airspace opacities. Mild spondylosis noted within the thoracic spine. IMPRESSION: 1. No airspace consolidation identified. 2. Cardiac enlargement and Aortic Atherosclerosis (ICD10-I70.0). Electronically Signed   By: Kerby Moors M.D.   On: 10/07/2017 16:10   Ct Angio Chest Pe W And/or Wo Contrast  Result Date: 10/07/2017 CLINICAL DATA:  Shortness of breath EXAM: CT ANGIOGRAPHY CHEST WITH CONTRAST TECHNIQUE: Multidetector CT imaging of the chest was performed using the standard protocol during bolus administration of intravenous contrast. Multiplanar CT image reconstructions and MIPs were obtained to evaluate the vascular anatomy. CONTRAST:  73 ml ISOVUE-370 IOPAMIDOL (ISOVUE-370) INJECTION 76% COMPARISON:  Radiograph 10/07/2017, CT chest 09/01/2016 FINDINGS: Cardiovascular: Satisfactory opacification of the pulmonary arteries to the segmental  level. No evidence of pulmonary embolism. Nonaneurysmal aorta. Moderate-to-marked aortic atherosclerosis. Coronary artery calcification. Cardiomegaly. No large pericardial effusion Mediastinum/Nodes: Midline trachea. Hypodensity at the right lobe of thyroid thought artifactual given normal appearance on recent comparison. Surgical clips at the left lobe. Prominent right paratracheal lymph node measuring up to 15 mm. Lungs/Pleura: Decreased ground-glass density. Mild residual ground-glass opacity within the bilateral lungs. No pleural effusion. No pneumothorax. Upper Abdomen: No acute abnormality. Nodular fullness of the left adrenal gland. Musculoskeletal: No chest wall abnormality. No acute or significant osseous findings. Review of the MIP images confirms the above findings. IMPRESSION: 1. Negative for acute pulmonary embolus 2. Overall decreased ground-glass density within the lung fields. There is residual mild mosaic/ground-glass density throughout the lung suspected to represent residual pulmonary edema. There is cardiomegaly. Aortic Atherosclerosis (ICD10-I70.0). Electronically Signed   By: Donavan Foil M.D.   On: 10/07/2017 19:28    Assessment and Plan:   1. Right upper extremity pain: Her thick arm tissue prevented me from feel her artery more clearly, however I do not hear any bruit to represent pseudoaneurysm.  Her tenderness seems to be very focal, although patient says it is swollen, I do not see any difference when compared to the other shoulder.  I think this is more likely musculoskeletal issue instead of complication from cardiac catheterization.  She is pending right upper extremity arterial ultrasound for definitive evaluation.  However with her symptoms being this mild, this can be done either as inpatient or as outpatient.  I doubt that there is any urgency to the study given the fact that patient has been having this pain for the past month.  2. Acute respiratory failure: improved after  breathing treatment, no sign of volume overload  3. CAD: Seen on recent cardiac catheterization on 09/11/2017.  Continue medical therapy include aspirin, statin and carvedilol.  4. Hypertension: Blood pressure remain elevated, increase losartan to 50 mg daily.  5. Hyperlipidemia: Continue statin, Lipitor 40 mg daily.  Recheck fasting lipid panel and LFT in 4 weeks.  6. DM 2 on insulin  7. Hypokalemia: Improving, arrived with potassium of 2.5.  Now 3.0.  Will need basic metabolic panel in 1 week.   For questions or updates, please contact [place] Please consult [email or web address] for contact info under Cardiology/STEMI.   Hilbert Corrigan, [place]  10/08/2017 1:47 PM   I have examined the patient and reviewed assessment and plan and discussed with patient.  Agree with above as stated.  Exam shows brisk pulses in the right brachial and radial arteries.  I do not think there is any arterial circulation compromise.  She reports swelling, but it is difficult to discern right upper extremity swelling.  Larae Grooms

## 2017-10-08 NOTE — Discharge Instructions (Signed)
Acute Respiratory Distress Syndrome, Adult °Acute respiratory distress syndrome is a life-threatening condition in which fluid collects in the lungs. This prevents the lungs from filling with air and passing oxygen into the blood. This can cause the lungs and other vital organs to fail. The condition usually develops following an infection, illness, surgery, or injury. °What are the causes? °This condition may be caused by: °· An infection, such as sepsis or pneumonia. °· A serious injury to the head or chest. °· Severe bleeding from an injury. °· A major surgery. °· Breathing in harmful chemicals or smoke. °· Blood transfusions. °· A blood clot in the lungs. °· Breathing in vomit (aspiration). °· Near-drowning. °· Inflammation of the pancreas (pancreatitis). °· A drug overdose. °What are the signs or symptoms? °Sudden shortness of breath and rapid breathing are the main symptoms of this condition. Other symptoms may include: °· A fast or irregular heartbeat. °· Skin, lips, or fingernails that look blue (cyanosis). °· Confusion. °· Tiredness or loss of energy. °· Chest pain, particularly while taking a breath. °· Coughing. °· Restlessness or anxiety. °· Fever. This is usually present if there is an underlying infection, such as pneumonia. °How is this diagnosed? °This condition is diagnosed based on: °· Your symptoms. °· Medical history. °· A physical exam. During the exam, your health care provider will listen to your heart and check for crackling or wheezing sounds in your lungs. °You may also have other tests to confirm the diagnosis and measure how well your lungs are working. These may include: °· Measuring the amount of oxygen in your blood. Your health care provider will use two methods to do this procedure: °¨ A small device (pulse oximeter) that is placed on your finger, earlobe, or toe. °¨ An arterial blood gas test. A sample of blood is taken from an artery and tested for oxygen levels. °· Blood  tests. °· Chest X-rays or CT scans to look for fluid in the lungs. °· Taking a sample of your sputum to test for infection. °· Heart test, such as an echocardiogram or electrocardiogram. This is done to rule out any heart problems (such as heart failure) that may be causing your symptoms. °· Bronchoscopy. During this test, a thin, flexible tube with a light is passed into the mouth or nose, down the windpipe, and into the lungs. °How is this treated? °Treatment depends on the cause of your condition. The goal is to support you while your lungs heal and the underlying cause is treated. Treatment may include: °· Oxygen therapy. This may be done through: °¨ A tube in your nose or a face mask. °¨ A ventilator. This device helps move air into and out of your lungs through a breathing tube that is inserted into your mouth or nose. °· Continuous positive airway pressure (CPAP). This treatment uses mild air pressure to keep the airways open. A mask or other device will be placed over your nose or mouth. °· Tracheostomy. During this procedure, a small cut is made in your neck to create an opening to your windpipe. A breathing tube is placed directly into your windpipe. The breathing tube is connected to a ventilator. This is done if you have problems with your airway or if you need a ventilator for a long period of time. °· Positioning you to lie on your stomach (prone position). °· Medicines, such as: °¨ Sedatives to help you relax. °¨ Blood pressure medicines. °¨ Antibiotics to treat infection. °¨ Blood thinners   to prevent blood clots. °¨ Diuretics to help prevent excess fluid. °· Fluids and nutrients given through an IV tube. °· Wearing compression stockings on your legs to prevent blood clots. °· Extra corporeal membrane oxygenation (ECMO). This treatment takes blood outside your body, adds oxygen, and removes carbon dioxide. The blood is then returned to your body. This treatment is only used in severe cases. °Follow  these instructions at home: °· Take over-the-counter and prescription medicines only as told by your health care provider. °· Do not use any products that contain nicotine or tobacco, such as cigarettes and e-cigarettes. If you need help quitting, ask your health care provider. °· Limit alcohol intake to no more than 1 drink per day for nonpregnant women and 2 drinks per day for men. One drink equals 12 oz of beer, 5 oz of wine, or 1½ oz of hard liquor. °· Ask friends and family to help you if daily activities make you tired. °· Attend any pulmonary rehabilitation as told by your health care provider. This may include: °¨ Education about your condition. °¨ Exercises. °¨ Breathing training. °¨ Counseling. °¨ Learning techniques to conserve energy. °¨ Nutrition counseling. °· Keep all follow-up visits as told by your health care provider. This is important. °Contact a health care provider if: °· You become short of breath during activity or while resting. °· You develop a cough that does not go away. °· You have a fever. °· Your symptoms do not get better or they get worse. °· You become anxious or depressed. °Get help right away if: °· You have sudden shortness of breath. °· You develop sudden chest pain that does not go away. °· You develop a rapid heart rate. °· You develop swelling or pain in one of your legs. °· You cough up blood. °· You have trouble breathing. °· Your skin, lips, or fingernails turn blue. °These symptoms may represent a serious problem that is an emergency. Do not wait to see if the symptoms will go away. Get medical help right away. Call your local emergency services (911 in the [place] Do not drive yourself to the hospital. °Summary °· Acute respiratory distress syndrome is a life-threatening condition in which fluid collects in the lungs, which leads the lungs and other vital organs to fail. °· This condition usually develops following an infection, illness, surgery, or injury. °· Sudden  shortness of breath and rapid breathing are the main symptoms of acute respiratory distress syndrome. °· Treatment may include oxygen therapy, continuous positive airway pressure (CPAP), tracheostomy, lying on your stomach (prone position), medicines, fluids and nutrients given through an IV tube, compression stockings, and extra corporeal membrane oxygenation (ECMO). °This information is not intended to replace advice given to you by your health care provider. Make sure you discuss any questions you have with your health care provider. °Document Released: 07/30/2005 Document Revised: 07/16/2016 Document Reviewed: 07/16/2016 °Elsevier Interactive Patient Education © 2017 [employer] ° °

## 2017-10-08 NOTE — Care Management Note (Signed)
Case Management Note  Patient Details  Name: Tequlia Gonsalves MRN: [id number] Date of Birth: 29-Jun-1956  Subjective/Objective:    Acute Resp Failure with Hypoxia              Action/Plan: Patient is independent at home; [facility]pt [facility] at the [facility] for October 16, 2017 at 9 am; has private insurance with Medicaid with prescription drug coverage; pharmacy of choice is the [facility]. CM will continue to follow for progression of care.  Expected Discharge Date:    possibly 10/09/2017              Expected Discharge Plan:  Home/Self Care  Discharge planning Services  CM Consult  Status of Service:  In process, will continue to follow  Sherrilyn Rist [phone] 10/08/2017, 2:57 PM

## 2017-10-08 NOTE — Discharge Summary (Addendum)
Physician Discharge Summary  Brenda Sims [id number] DOB: 02-20-1956 DOA: 10/07/2017  PCP: Patient, No Pcp Per  Admit date: 10/07/2017 Discharge date: 10/08/2017  Time spent: 45 minutes  Recommendations for Outpatient Follow-up:  Patient will be discharged to home.  Patient will need to follow up with primary care provider within one week of discharge.  Patient should continue medications as prescribed.  Patient should follow a heart healthy/carb modified diet.   Discharge Diagnoses:  Acute respiratory failure with hypoxia likely secondary to acute bronchitis Severe hypokalemia Chronic combined systolic and diastolic heart failure Diabetes mellitus, type II Right upper extremity pain Essential hypertension Chronic kidney disease, stage III Anemia of chronic disease  Discharge Condition: Stable  Diet recommendation: Carb modified/heart healthy  Filed Weights   10/07/17 2300 10/07/17 2308 10/08/17 0639  Weight: 82.3 kg (181 lb 7 oz) 79.3 kg (174 lb 14.4 oz) 78.2 kg (172 lb 6.4 oz)    History of present illness:  On 10/07/2017 by Dr. Gean Birchwood Brenda Sims is a 62 y.o. female with history of recent admission for cardiac arrest in the setting of acute hypoxic respiratory failure likely from pulmonary edema and possible pneumonia cardiac cath showed EF of 35-45% with history of hypertension, diabetes mellitus type 2 presents to the ER with complaints of increasing shortness of breath over the last few days.  Shortness of breath at present even at rest with some productive cough and wheezing.  Denies any chest pain.  Denies any nausea or vomiting.  Hospital Course:  Acute respiratory failure with hypoxia likely secondary to acute bronchitis -Patient was noted to have hypoxia in the emergency department prior to admission -CXR shows no infection -CTA negative for acute pulmonary embolism.  Overall decreased groundglass density within the lung fields. -Influenza PCR  unremarkable -Strep pneumonia urine antigen negative -Currently on room air with oxygen saturations of 92% -will discharge patient with doxycycline and prednisone taper  Severe hypokalemia -Potassium on admission was 2.5, was given supplementation -magnesium 2 -Repeat potassium up to 3.3, given additional dose of potassium prior to discharge  -Repeat BMP in 1 week  CAD -currently no chest pain -recently had cardiac cath 09/11/2017 -continue statin, aspirin, coreg  Chronic combined systolic and diastolic heart failure -Recent heart catheterization showed an EF of 35-45% -Continue Cozaar -Lasix was held due to hypokalemia, may resume upon discharge -Currently appears to be euvolemic and compensated  Diabetes mellitus, type II -Continue Lantus, metformin upon discharge   Right upper extremity pain -Has been occurring for over 1 month  -No obvious swelling noted  -R Upper ext doppler negative for DVT or SVT  Essential hypertension -Continue cozaar, amlodipine, clonidine, hydralazine, coreg  Chronic kidney disease, stage III -creatinine appears to be at baseline  Anemia of chronic disease -Hemoglobin appears to be stable  Procedures: Upper extremity doppler   Consultations: Cardiology  Discharge Exam: Vitals:   10/08/17 1201 10/08/17 1449  BP: (!) 143/59   Pulse: 90   Resp:    Temp: 98.2 F (36.8 C)   SpO2: 96% 92%   Patient seen and examined.  Currently denies any further shortness of breath.  Feels her cough is improved.  Denies any chest pain, abdominal pain, nausea vomiting, diarrhea constipation, dizziness or headache.   General: Well developed, well nourished, NAD, appears stated age  HEENT: NCAT, PERRLA, EOMI, Anicteic Sclera, mucous membranes moist.  Neck: Supple  Cardiovascular: S1 S2 auscultated, no rubs, murmurs or gallops. Regular rate and rhythm.  Respiratory: Clear to auscultation  bilaterally with equal chest rise  Abdomen: Soft, nontender,  nondistended, + bowel sounds  Extremities: warm dry without cyanosis clubbing or edema  Neuro: AAOx3, nonfocal  Psych: mildly anxious, however appropriate  Discharge Instructions Discharge Instructions    Discharge instructions   Complete by:  As directed    Patient will be discharged to home.  Patient will need to follow up with primary care provider within one week of discharge.  Patient should continue medications as prescribed.  Patient should follow a heart healthy/carb modified diet.     Allergies as of 10/08/2017      Reactions   Eggs Or Egg-derived Products Shortness Of Breath, Rash   Azithromycin Swelling   Facial/throat swelling   Cherry Swelling   Facial swelling      Medication List    STOP taking these medications   CLONIDINE HCL PO   polyethylene glycol packet Commonly known as:  MIRALAX / GLYCOLAX     TAKE these medications   acetaminophen 500 MG tablet Commonly known as:  TYLENOL Take 1,000 mg by mouth every 6 (six) hours as needed for headache (pain).   amLODipine 5 MG tablet Commonly known as:  NORVASC Take 1 tablet (5 mg total) by mouth daily.   aspirin EC 81 MG tablet Take 81 mg by mouth daily.   atorvastatin 40 MG tablet Commonly known as:  LIPITOR Take 1 tablet (40 mg total) by mouth at bedtime.   carvedilol 6.25 MG tablet Commonly known as:  COREG Take 1 tablet (6.25 mg total) by mouth 2 (two) times daily with a meal.   doxycycline 100 MG tablet Commonly known as:  VIBRA-TABS Take 1 tablet (100 mg total) by mouth 2 (two) times daily.   furosemide 20 MG tablet Commonly known as:  LASIX Take 1 tablet (20 mg total) by mouth daily.   gabapentin 300 MG capsule Commonly known as:  NEURONTIN Take 300 mg by mouth 3 (three) times daily. What changed:  Another medication with the same name was removed. Continue taking this medication, and follow the directions you see here.   guaiFENesin-dextromethorphan 100-10 MG/5ML syrup Commonly known  as:  ROBITUSSIN DM Take 5 mLs by mouth every 4 (four) hours as needed for cough.   hydrALAZINE 100 MG tablet Commonly known as:  APRESOLINE Take 1 tablet (100 mg total) by mouth 3 (three) times daily.   insulin glargine 100 unit/mL Sopn Commonly known as:  LANTUS Inject 20 Units into the skin 2 (two) times daily.   losartan 50 MG tablet Commonly known as:  COZAAR Take 1 tablet (50 mg total) by mouth daily. Start taking on:  10/09/2017 What changed:    medication strength  how much to take   metFORMIN 1000 MG tablet Commonly known as:  GLUCOPHAGE Take 1,000 mg by mouth 2 (two) times daily with a meal.   montelukast 10 MG tablet Commonly known as:  SINGULAIR Take 10 mg by mouth at bedtime.   potassium chloride SA 20 MEQ tablet Commonly known as:  K-DUR,KLOR-CON Take 1 tablet (20 mEq total) by mouth daily.   predniSONE 10 MG tablet Commonly known as:  DELTASONE Take 1 tablet (10 mg total) by mouth daily. Take 4 tabs x 1 day, then 3 tabs x 1 day, then 2 tabs x 1 day, then 1 tab x 1day      Allergies  Allergen Reactions  . Eggs Or Egg-Derived Products Shortness Of Breath and Rash  . Azithromycin Swelling  Facial/throat swelling  . Cherry Swelling    Facial swelling   Follow-up Information    [facility] Follow up on 10/16/2017.   Why:  @9 :00am Contact information: [place] [address] [phone]       Belva Crome, MD. Schedule an appointment as soon as possible for a visit in 1 week(s).   Specialty:  Cardiology Why:  Hospital follow up Contact information: [address] [place] [address] [phone]            The results of significant diagnostics from this hospitalization (including imaging, microbiology, ancillary and laboratory) are listed below for reference.    Significant Diagnostic Studies: Dg Chest 2 View  Result Date: 10/07/2017 CLINICAL DATA:   Shortness of breath and cough EXAM: CHEST  2 VIEW COMPARISON:  09/06/2017 FINDINGS: Moderate cardiac enlargement. There is aortic atherosclerosis. No pleural effusion or edema. No airspace opacities. Mild spondylosis noted within the thoracic spine. IMPRESSION: 1. No airspace consolidation identified. 2. Cardiac enlargement and Aortic Atherosclerosis (ICD10-I70.0). Electronically Signed   By: Kerby Moors M.D.   On: 10/07/2017 16:10   Ct Angio Chest Pe W And/or Wo Contrast  Result Date: 10/07/2017 CLINICAL DATA:  Shortness of breath EXAM: CT ANGIOGRAPHY CHEST WITH CONTRAST TECHNIQUE: Multidetector CT imaging of the chest was performed using the standard protocol during bolus administration of intravenous contrast. Multiplanar CT image reconstructions and MIPs were obtained to evaluate the vascular anatomy. CONTRAST:  73 ml ISOVUE-370 IOPAMIDOL (ISOVUE-370) INJECTION 76% COMPARISON:  Radiograph 10/07/2017, CT chest 09/01/2016 FINDINGS: Cardiovascular: Satisfactory opacification of the pulmonary arteries to the segmental level. No evidence of pulmonary embolism. Nonaneurysmal aorta. Moderate-to-marked aortic atherosclerosis. Coronary artery calcification. Cardiomegaly. No large pericardial effusion Mediastinum/Nodes: Midline trachea. Hypodensity at the right lobe of thyroid thought artifactual given normal appearance on recent comparison. Surgical clips at the left lobe. Prominent right paratracheal lymph node measuring up to 15 mm. Lungs/Pleura: Decreased ground-glass density. Mild residual ground-glass opacity within the bilateral lungs. No pleural effusion. No pneumothorax. Upper Abdomen: No acute abnormality. Nodular fullness of the left adrenal gland. Musculoskeletal: No chest wall abnormality. No acute or significant osseous findings. Review of the MIP images confirms the above findings. IMPRESSION: 1. Negative for acute pulmonary embolus 2. Overall decreased ground-glass density within the lung fields.  There is residual mild mosaic/ground-glass density throughout the lung suspected to represent residual pulmonary edema. There is cardiomegaly. Aortic Atherosclerosis (ICD10-I70.0). Electronically Signed   By: Donavan Foil M.D.   On: 10/07/2017 19:28    Microbiology: No results found for this or any previous visit (from the past 240 hour(s)).   Labs: Basic Metabolic Panel: Recent Labs  Lab 10/07/17 1525 10/08/17 0524 10/08/17 1416  NA 143 142  --   K 2.5* 3.0* 3.3*  CL 101 104  --   CO2 28 26  --   GLUCOSE 47* 234*  --   BUN 12 17  --   CREATININE 1.29* 1.17*  --   CALCIUM 9.0 9.0  --   MG 2.0 2.0  --    Liver Function Tests: No results for input(s): AST, ALT, ALKPHOS, BILITOT, PROT, ALBUMIN in the last 168 hours. No results for input(s): LIPASE, AMYLASE in the last 168 hours. No results for input(s): AMMONIA in the last 168 hours. CBC: Recent Labs  Lab 10/07/17 1525 10/08/17 0524  WBC 10.1 10.2  HGB 10.5* 10.3*  HCT 33.0* 32.7*  MCV 67.9* 68.6*  PLT 327 296   Cardiac Enzymes: Recent Labs  Lab 10/08/17 0524  TROPONINI <0.03   BNP: BNP (last 3 results) Recent Labs    08/29/17 1530 10/07/17 2030  BNP 1,016.4* 419.8*    ProBNP (last 3 results) No results for input(s): PROBNP in the last 8760 hours.  CBG: Recent Labs  Lab 10/07/17 2330 10/08/17 0526 10/08/17 0726 10/08/17 1123 10/08/17 1604  GLUCAP 212* 225* 208* 406* 178*       Signed:  Graysyn Bache  [facility] 10/08/2017, 4:37 PM

## 2017-10-08 NOTE — Progress Notes (Signed)
RUE venous duplex prelim: negative for DVT and SVT. Brenda Sims Eunice, RDMS, RVT  

## 2017-10-09 LAB — LEGIONELLA PNEUMOPHILA SEROGP 1 UR AG: L. PNEUMOPHILA SEROGP 1 UR AG: NEGATIVE

## 2017-10-14 MED FILL — hydrALAZINE HCL 100 MG TABS: 100 | 30 days supply | Qty: 90 | Fill #1

## 2017-10-14 MED FILL — FUROSEMIDE 20 MG TABLET: 20 | 30 days supply | Qty: 30 | Fill #1

## 2017-10-16 ENCOUNTER — Telehealth: Payer: Self-pay | Admitting: General Practice

## 2017-10-16 ENCOUNTER — Ambulatory Visit: Payer: Medicaid Other | Attending: Internal Medicine | Admitting: Physician Assistant

## 2017-10-16 VITALS — BP 189/85 | HR 75 | Temp 98.1°F | Resp 16 | Ht 64.5 in | Wt 179.0 lb

## 2017-10-16 DIAGNOSIS — Z794 Long term (current) use of insulin: Secondary | ICD-10-CM | POA: Diagnosis not present

## 2017-10-16 DIAGNOSIS — M25511 Pain in right shoulder: Secondary | ICD-10-CM | POA: Diagnosis not present

## 2017-10-16 DIAGNOSIS — N189 Chronic kidney disease, unspecified: Secondary | ICD-10-CM | POA: Diagnosis not present

## 2017-10-16 DIAGNOSIS — Z91018 Allergy to other foods: Secondary | ICD-10-CM | POA: Diagnosis not present

## 2017-10-16 DIAGNOSIS — F17200 Nicotine dependence, unspecified, uncomplicated: Secondary | ICD-10-CM | POA: Insufficient documentation

## 2017-10-16 DIAGNOSIS — I469 Cardiac arrest, cause unspecified: Secondary | ICD-10-CM | POA: Diagnosis not present

## 2017-10-16 DIAGNOSIS — Z96651 Presence of right artificial knee joint: Secondary | ICD-10-CM | POA: Diagnosis not present

## 2017-10-16 DIAGNOSIS — E109 Type 1 diabetes mellitus without complications: Secondary | ICD-10-CM | POA: Diagnosis not present

## 2017-10-16 DIAGNOSIS — I1310 Hypertensive heart and chronic kidney disease without heart failure, with stage 1 through stage 4 chronic kidney disease, or unspecified chronic kidney disease: Secondary | ICD-10-CM | POA: Diagnosis not present

## 2017-10-16 DIAGNOSIS — J449 Chronic obstructive pulmonary disease, unspecified: Secondary | ICD-10-CM | POA: Insufficient documentation

## 2017-10-16 DIAGNOSIS — E1122 Type 2 diabetes mellitus with diabetic chronic kidney disease: Secondary | ICD-10-CM | POA: Insufficient documentation

## 2017-10-16 DIAGNOSIS — I160 Hypertensive urgency: Secondary | ICD-10-CM | POA: Insufficient documentation

## 2017-10-16 DIAGNOSIS — E876 Hypokalemia: Secondary | ICD-10-CM

## 2017-10-16 DIAGNOSIS — Z91012 Allergy to eggs: Secondary | ICD-10-CM | POA: Diagnosis not present

## 2017-10-16 DIAGNOSIS — J9601 Acute respiratory failure with hypoxia: Secondary | ICD-10-CM | POA: Insufficient documentation

## 2017-10-16 DIAGNOSIS — Z79899 Other long term (current) drug therapy: Secondary | ICD-10-CM | POA: Diagnosis not present

## 2017-10-16 DIAGNOSIS — F419 Anxiety disorder, unspecified: Secondary | ICD-10-CM | POA: Insufficient documentation

## 2017-10-16 DIAGNOSIS — Z881 Allergy status to other antibiotic agents status: Secondary | ICD-10-CM | POA: Diagnosis not present

## 2017-10-16 DIAGNOSIS — M79601 Pain in right arm: Secondary | ICD-10-CM | POA: Insufficient documentation

## 2017-10-16 DIAGNOSIS — I10 Essential (primary) hypertension: Secondary | ICD-10-CM

## 2017-10-16 LAB — GLUCOSE, POCT (MANUAL RESULT ENTRY): POC GLUCOSE: 99 mg/dL (ref 70–99)

## 2017-10-16 MED ORDER — TRAMADOL HCL 50 MG PO TABS: 50.0000 mg | ORAL_TABLET | Freq: Three times a day (TID) | ORAL | 0 refills | Status: DC | PRN

## 2017-10-16 MED ORDER — CARVEDILOL 6.25 MG PO TABS: 6.2500 mg | ORAL_TABLET | Freq: Two times a day (BID) | ORAL | 3 refills | Status: DC

## 2017-10-16 MED ORDER — GABAPENTIN 300 MG PO CAPS: 300.0000 mg | ORAL_CAPSULE | Freq: Three times a day (TID) | ORAL | 3 refills | Status: DC

## 2017-10-16 MED ORDER — GLUCOSE BLOOD VI STRP: ORAL_STRIP | 12 refills | Status: DC

## 2017-10-16 MED ORDER — ATORVASTATIN CALCIUM 40 MG PO TABS: 40.0000 mg | ORAL_TABLET | Freq: Every day | ORAL | 3 refills | Status: DC

## 2017-10-16 MED ORDER — FLUOXETINE HCL 20 MG PO TABS: 20.0000 mg | ORAL_TABLET | Freq: Every day | ORAL | 3 refills | Status: DC

## 2017-10-16 MED ORDER — METFORMIN HCL 1000 MG PO TABS: 1000.0000 mg | ORAL_TABLET | Freq: Two times a day (BID) | ORAL | 3 refills | Status: DC

## 2017-10-16 MED ORDER — MONTELUKAST SODIUM 10 MG PO TABS: 10.0000 mg | ORAL_TABLET | Freq: Every day | ORAL | 3 refills | Status: DC

## 2017-10-16 MED ORDER — TRUEPLUS LANCETS 28G MISC: 28.0000 g | Freq: Four times a day (QID) | 3 refills | Status: DC

## 2017-10-16 MED ORDER — POTASSIUM CHLORIDE CRYS ER 20 MEQ PO TBCR: 20.0000 meq | EXTENDED_RELEASE_TABLET | Freq: Every day | ORAL | 3 refills | Status: DC

## 2017-10-16 MED ORDER — ASPIRIN EC 81 MG PO TBEC
81.0000 mg | DELAYED_RELEASE_TABLET | Freq: Every day | ORAL | 0 refills | Status: AC
Start: 2017-10-16 — End: ?

## 2017-10-16 MED ORDER — AMLODIPINE BESYLATE 10 MG PO TABS: 10.0000 mg | ORAL_TABLET | Freq: Every day | ORAL | 3 refills | Status: DC

## 2017-10-16 MED ORDER — ACCU-CHEK SOFTCLIX LANCETS MISC: 12 refills | Status: DC

## 2017-10-16 MED ORDER — LOSARTAN POTASSIUM 100 MG PO TABS: 100.0000 mg | ORAL_TABLET | Freq: Every day | ORAL | 3 refills | Status: DC

## 2017-10-16 MED ORDER — CLONIDINE HCL 0.1 MG PO TABS
0.2000 mg | ORAL_TABLET | Freq: Once | ORAL | Status: AC
  Administered 2017-10-16: 0.2 mg via ORAL

## 2017-10-16 MED ORDER — FUROSEMIDE 20 MG PO TABS: 20.0000 mg | ORAL_TABLET | Freq: Every day | ORAL | 3 refills | Status: DC

## 2017-10-16 MED ORDER — TRUE METRIX METER DEVI: 1.0000 | Freq: Four times a day (QID) | 0 refills | Status: DC

## 2017-10-16 MED ORDER — HYDRALAZINE HCL 100 MG PO TABS: 100.0000 mg | ORAL_TABLET | Freq: Three times a day (TID) | ORAL | 3 refills | Status: DC

## 2017-10-16 MED ORDER — ACCU-CHEK AVIVA DEVI: 0 refills | Status: AC

## 2017-10-16 MED FILL — CARVEDILOL 6.25 MG TABLET: 6.25 | 30 days supply | Qty: 60 | Fill #0

## 2017-10-16 MED FILL — ACCU-CHEK SOFTCLIX LANCETS: 25 days supply | Qty: 100 | Fill #0

## 2017-10-16 MED FILL — AMLODIPINE BESYLATE 10 MG T: 10 | 30 days supply | Qty: 30 | Fill #0

## 2017-10-16 MED FILL — MONTELUKAST SOD 10 MG TAB: 10 | 30 days supply | Qty: 30 | Fill #0

## 2017-10-16 MED FILL — metFORMIN HCL 1000 MG TABS: 1000 | 30 days supply | Qty: 60 | Fill #0

## 2017-10-16 MED FILL — GABAPENTIN 300 MG CAPSULE: 300 | 30 days supply | Qty: 90 | Fill #0

## 2017-10-16 MED FILL — LOSARTAN POTASSIUM 100 MG T: 100 | 30 days supply | Qty: 30 | Fill #0

## 2017-10-16 MED FILL — POTASSIUM CL ER 20 MEQ TAB: 20 | 30 days supply | Qty: 30 | Fill #0

## 2017-10-16 MED FILL — ACCU-CHEK AVIVA PLUS METER: W/DEVICE | 30 days supply | Qty: 1 | Fill #0

## 2017-10-16 MED FILL — ACCU-CHEK AVIVA PLUS TEST S: 25 days supply | Qty: 100 | Fill #0

## 2017-10-16 MED FILL — traMADol HCL 50 MG TABS: 50 | 7 days supply | Qty: 21 | Fill #0

## 2017-10-16 NOTE — Progress Notes (Signed)
Ta Fair  [id number]  [id number]  DOB - 1955/12/05  Chief Complaint  Patient presents with  . Hospitalization Follow-up       Subjective:   Brenda Sims is a 62 y.o. female here today for establishment of care. She has a past medical history of hypertension, diabetes mellitus type 2, asthma/COPD, knee surgery and is a smoker. She recently moved here from [place] less than 3 months ago. She presented to the emergency department via urgent care on 08/29/2017 with shortness of breath, dyspnea, cough and intermittent fevers. She was found to be hypoxic. A chest x-ray showed some cardiomegaly with pulmonary edema and suggested pneumonia as well. She was hospitalized by the internal medicine team. Her hospital course was complicated by a PA arrest where she required intubation. She was activated 09/06/2017. Also in her hospital stay there was diastolic heart failure with exacerbation, chronic anemia, metabolic encephalopathy and acute kidney injury. Her creatinine peaked at 2.3. Cardiology was consult it for the ejection fraction. She underwent cardiac catheterization and was found to have moderate nonobstructive disease. She is being treated medically. Her initial echo showed an EF of 45-50%. Her LV gram during cardiac catheterization suggested lower 35-45%. Nevertheless she is on guideline directed medical therapy. She is supposed to see cardiology within 1 week. She was discharged 09/12/2017.  She re presented to the hospital on 10/07/2017 with more shortness of breath, wheezing and cough. She was diagnosed with acute bronchitis because her chest x-ray showed no evidence of infection. A CTPA showed no evidence of pulmonary emboli. She was fluid negative. She was hypokalemic with a potassium of 2.5. She stayed overnight for observation and was discharged on 10/08/2017 with doxycycline.  She states that multiple medications have been missed. He doesn't sound like she was clear on what to  take when she went home. I know that she has not taken Norvasc. She has not had any antihypertensives today. She is out of metformin. We did an extensive review of medications.  She is also asking for something for pain. Her right upper extremity has cause pain. It sounds like she probably has some swans Ganz catheterization for some type of access in the right neck. She also had right brachial artery access for cardiac catheterization.  She also has a great deal of anxiety. Has difficulty sleeping at night because she suffered the PA arrest and believes that she won't wake up.  Needs refills on multiple medications. Husband accompanied her today.  ROS: GEN: denies fever or chills, denies change in weight Skin: denies lesions or rashes HEENT: denies headache, earache, epistaxis, sore throat, or neck pain LUNGS: +SHOB but better, dyspnea, PND, orthopnea CV: denies CP or palpitations ABD: denies abd pain, N or V EXT: denies muscle spasms or swelling; no pain in lower ext, no weakness NEURO: denies numbness or tingling, denies sz, stroke or TIA  ALLERGIES: Allergies  Allergen Reactions  . Eggs Or Egg-Derived Products Shortness Of Breath and Rash  . Azithromycin Swelling    Facial/throat swelling  . Cherry Swelling    Facial swelling    PAST MEDICAL HISTORY: Past Medical History:  Diagnosis Date  . CKD (chronic kidney disease)   . Diabetes mellitus without complication ([facility] [place])   . Hypertension     PAST SURGICAL HISTORY: Past Surgical History:  Procedure Laterality Date  . APPENDECTOMY    . BACK SURGERY    . LEFT HEART CATH AND CORONARY ANGIOGRAPHY N/A 09/11/2017   Procedure: LEFT HEART CATH AND  CORONARY ANGIOGRAPHY;  Surgeon: Martinique, Peter M, MD;  Location: [facility];  Service: Cardiovascular;  Laterality: N/A;  . TOTAL KNEE ARTHROPLASTY Right 2007    MEDICATIONS AT HOME: Prior to Admission medications   Medication Sig Start Date End Date Taking? Authorizing Provider   atorvastatin (LIPITOR) 40 MG tablet Take 1 tablet (40 mg total) by mouth at bedtime. 10/16/17  Yes Ena Dawley, Rukia Mcgillivray S, PA-C  carvedilol (COREG) 6.25 MG tablet Take 1 tablet (6.25 mg total) by mouth 2 (two) times daily with a meal. 10/16/17  Yes Ena Dawley, Emaree Chiu S, PA-C  furosemide (LASIX) 20 MG tablet Take 1 tablet (20 mg total) by mouth daily. 10/16/17  Yes Ena Dawley, Nikola Marone S, PA-C  gabapentin (NEURONTIN) 300 MG capsule Take 1 capsule (300 mg total) by mouth 3 (three) times daily. 10/16/17  Yes Legion Discher S, PA-C  guaiFENesin-dextromethorphan (ROBITUSSIN DM) 100-10 MG/5ML syrup Take 5 mLs by mouth every 4 (four) hours as needed for cough. 10/08/17  Yes Mikhail, Velta Addison, DO  hydrALAZINE (APRESOLINE) 100 MG tablet Take 1 tablet (100 mg total) by mouth 3 (three) times daily. 10/16/17  Yes Ena Dawley, Mayley Lish S, PA-C  insulin glargine (LANTUS) 100 unit/mL SOPN Inject 20 Units into the skin 2 (two) times daily.   Yes [provider]  losartan (COZAAR) 100 MG tablet Take 1 tablet (100 mg total) by mouth daily. 10/16/17  Yes Ena Dawley, Onesty Clair S, PA-C  montelukast (SINGULAIR) 10 MG tablet Take 1 tablet (10 mg total) by mouth at bedtime. 10/16/17  Yes Ena Dawley, Azie Mcconahy S, PA-C  acetaminophen (TYLENOL) 500 MG tablet Take 1,000 mg by mouth every 6 (six) hours as needed for headache (pain).    [provider]  amLODipine (NORVASC) 10 MG tablet Take 1 tablet (10 mg total) by mouth daily. 10/16/17   Brayton Caves, PA-C  aspirin EC 81 MG tablet Take 1 tablet (81 mg total) by mouth daily. 10/16/17   Brayton Caves, PA-C  Blood Glucose Monitoring Suppl (TRUE METRIX METER) DEVI 1 kit by Does not apply route 4 (four) times daily. 10/16/17   Brayton Caves, PA-C  glucose blood (TRUE METRIX BLOOD GLUCOSE TEST) test strip Use as instructed 10/16/17   Brayton Caves, PA-C  metFORMIN (GLUCOPHAGE) 1000 MG tablet Take 1 tablet (1,000 mg total) by mouth 2 (two) times daily with a meal. 10/16/17   Ena Dawley, Jenni Thew S, PA-C  potassium chloride SA  (K-DUR,KLOR-CON) 20 MEQ tablet Take 1 tablet (20 mEq total) by mouth daily. 10/16/17   Brayton Caves, PA-C  traMADol (ULTRAM) 50 MG tablet Take 1 tablet (50 mg total) by mouth every 8 (eight) hours as needed. 10/16/17   Brayton Caves, PA-C  TRUEPLUS LANCETS 28G MISC 28 g by Does not apply route 4 (four) times daily. 10/16/17   Brayton Caves, PA-C    Family History  Problem Relation Age of Onset  . Hypertension Other    Social-married, recently moved here from [place], smoker, does not work  Objective:   Vitals:   10/16/17 0921  BP: (!) 221/100  Pulse: 75  Resp: 16  Temp: 98.1 F (36.7 C)  TempSrc: Oral  SpO2: 94%  Weight: 179 lb (81.2 kg)  Height: 5' 4.5" (1.638 m)    Exam General appearance : Awake, alert, not in any distress. Speech Clear. Not toxic looking HEENT: Atraumatic and Normocephalic, pupils equally reactive to light and accomodation Neck: supple, no JVD. No cervical lymphadenopathy.  Chest:fw rhonchi, no crackles CVS: S1  S2 regular, no murmurs.  Abdomen: Bowel sounds present, Non tender and not distended with no guarding, rigidity or rebound. Extremities: B/L Lower Ext shows no edema, both legs are warm to touch Neurology: Awake alert, and oriented X 3, CN II-XII intact, Non focal Skin:No Rash Wounds:N/A  Data Review Lab Results  Component Value Date   HGBA1C 6.7 (H) 09/05/2017   HGBA1C 6.6 (H) 08/30/2017     Assessment & Plan  1. Acute hypoxic Resp Failure  -no med changes  -avoid triggers 2. HTN urgency  -does not want to go back to ED, did not have meds today  -Clonidine here  -Inc Norvasc and Cozaar; cont Hydral and Lasix and Coreg  -low salt diet  -RN visit in 1 week to reassess 3. DM 2  -refilled Metformin; on Lantus  -meter provided  -keep log TID-QID and bring to appt 4. CM EF 35-45%  -appt with [facility] Dr. Tamala Julian soon  -Cont GDMT  -low salt diet 5. Anxiety  -Fluoxetine 6. Right arm/shoulder pain  -Tramadol   Return in about 1  week (around 10/23/2017).  The patient was given clear instructions to go to ER or return to medical center if symptoms don't improve, worsen or new problems develop. The patient verbalized understanding. The patient was told to call to get lab results if they haven't heard anything in the next week.   Total time spent with patient was 46 min. Greater than 50 % of this visit was spent face to face counseling and coordinating care regarding risk factor modification, compliance importance and encouragement, education related to multi complaints and hospitalization review.  This note has been created with Surveyor, quantity. Any transcriptional errors are unintentional.    Zettie Pho, PA-C [facility] and [facility], [place]   10/16/2017, 10:02 AM

## 2017-10-16 NOTE — Telephone Encounter (Signed)
Returned pt call and left a detailed vm informing pt that [facility] sent a rx for prozac and she can pick it up from the pharmacy and if she has any questions or concerns to give me a call

## 2017-10-16 NOTE — Telephone Encounter (Signed)
Pt called to request a descriptions of each medication given to her, she is confused on weather they gave her an anxiety medication. Please follow up

## 2017-10-17 ENCOUNTER — Other Ambulatory Visit: Payer: Self-pay | Admitting: Pharmacist

## 2017-10-17 ENCOUNTER — Encounter: Payer: Self-pay | Admitting: Pharmacist

## 2017-10-17 LAB — BASIC METABOLIC PANEL
BUN/Creatinine Ratio: 13 (ref 12–28)
BUN: 17 mg/dL (ref 8–27)
CHLORIDE: 104 mmol/L (ref 96–106)
CO2: 25 mmol/L (ref 20–29)
CREATININE: 1.35 mg/dL — AB (ref 0.57–1.00)
Calcium: 8.8 mg/dL (ref 8.7–10.3)
GFR calc Af Amer: 49 mL/min/{1.73_m2} — ABNORMAL LOW (ref >59)
GFR calc non Af Amer: 42 mL/min/{1.73_m2} — ABNORMAL LOW (ref >59)
GLUCOSE: 100 mg/dL — AB (ref 65–99)
POTASSIUM: 3.2 mmol/L — AB (ref 3.5–5.2)
SODIUM: 146 mmol/L — AB (ref 134–144)

## 2017-10-17 MED ORDER — FLUOXETINE HCL 20 MG PO CAPS: 20.0000 mg | ORAL_CAPSULE | Freq: Every day | ORAL | 3 refills | Status: DC

## 2017-10-17 MED FILL — FLUoxetine HCL 20 MG CAPS: 20 | 30 days supply | Qty: 30 | Fill #0

## 2017-10-17 NOTE — Progress Notes (Signed)
PA completed and approved for tramadol. Approval #[id number]  Fluoxetine tablets not covered by [place] Medicaid. Switched to capsules.

## 2017-10-18 ENCOUNTER — Telehealth: Payer: Self-pay | Admitting: *Deleted

## 2017-10-18 NOTE — Telephone Encounter (Signed)
Pt to be informed:  Tommie Sams, RN        Confirm that she is taking her K+. Otherwise encourage her to keep follow up appt and will need additional blood work that visit. Thanks   Left message on voicemail to return call.

## 2017-10-19 ENCOUNTER — Encounter (HOSPITAL_COMMUNITY): Payer: Self-pay | Admitting: Emergency Medicine

## 2017-10-19 ENCOUNTER — Emergency Department (HOSPITAL_COMMUNITY): Payer: Medicaid Other

## 2017-10-19 ENCOUNTER — Inpatient Hospital Stay (HOSPITAL_COMMUNITY)
Admission: EM | Admit: 2017-10-19 | Discharge: 2017-10-23 | DRG: 291 | Disposition: A | Discharge status: Home Health | Payer: Medicaid Other | Attending: Internal Medicine | Admitting: Internal Medicine

## 2017-10-19 ENCOUNTER — Other Ambulatory Visit: Payer: Self-pay

## 2017-10-19 DIAGNOSIS — G4733 Obstructive sleep apnea (adult) (pediatric): Secondary | ICD-10-CM | POA: Diagnosis present

## 2017-10-19 DIAGNOSIS — E876 Hypokalemia: Secondary | ICD-10-CM | POA: Diagnosis present

## 2017-10-19 DIAGNOSIS — T502X5A Adverse effect of carbonic-anhydrase inhibitors, benzothiadiazides and other diuretics, initial encounter: Secondary | ICD-10-CM | POA: Diagnosis present

## 2017-10-19 DIAGNOSIS — Z96651 Presence of right artificial knee joint: Secondary | ICD-10-CM | POA: Diagnosis present

## 2017-10-19 DIAGNOSIS — D72829 Elevated white blood cell count, unspecified: Secondary | ICD-10-CM | POA: Diagnosis present

## 2017-10-19 DIAGNOSIS — R0602 Shortness of breath: Secondary | ICD-10-CM | POA: Diagnosis present

## 2017-10-19 DIAGNOSIS — F329 Major depressive disorder, single episode, unspecified: Secondary | ICD-10-CM | POA: Diagnosis present

## 2017-10-19 DIAGNOSIS — N179 Acute kidney failure, unspecified: Secondary | ICD-10-CM | POA: Diagnosis present

## 2017-10-19 DIAGNOSIS — D631 Anemia in chronic kidney disease: Secondary | ICD-10-CM | POA: Diagnosis present

## 2017-10-19 DIAGNOSIS — N183 Chronic kidney disease, stage 3 unspecified: Secondary | ICD-10-CM | POA: Diagnosis present

## 2017-10-19 DIAGNOSIS — D649 Anemia, unspecified: Secondary | ICD-10-CM | POA: Diagnosis not present

## 2017-10-19 DIAGNOSIS — I5043 Acute on chronic combined systolic (congestive) and diastolic (congestive) heart failure: Secondary | ICD-10-CM | POA: Diagnosis present

## 2017-10-19 DIAGNOSIS — J40 Bronchitis, not specified as acute or chronic: Secondary | ICD-10-CM | POA: Diagnosis present

## 2017-10-19 DIAGNOSIS — E1129 Type 2 diabetes mellitus with other diabetic kidney complication: Secondary | ICD-10-CM

## 2017-10-19 DIAGNOSIS — I509 Heart failure, unspecified: Secondary | ICD-10-CM

## 2017-10-19 DIAGNOSIS — I472 Ventricular tachycardia: Secondary | ICD-10-CM | POA: Diagnosis not present

## 2017-10-19 DIAGNOSIS — Z881 Allergy status to other antibiotic agents status: Secondary | ICD-10-CM

## 2017-10-19 DIAGNOSIS — I130 Hypertensive heart and chronic kidney disease with heart failure and stage 1 through stage 4 chronic kidney disease, or unspecified chronic kidney disease: Secondary | ICD-10-CM | POA: Diagnosis present

## 2017-10-19 DIAGNOSIS — Z794 Long term (current) use of insulin: Secondary | ICD-10-CM

## 2017-10-19 DIAGNOSIS — Z91018 Allergy to other foods: Secondary | ICD-10-CM | POA: Diagnosis not present

## 2017-10-19 DIAGNOSIS — J9601 Acute respiratory failure with hypoxia: Secondary | ICD-10-CM | POA: Diagnosis present

## 2017-10-19 DIAGNOSIS — E785 Hyperlipidemia, unspecified: Secondary | ICD-10-CM | POA: Diagnosis present

## 2017-10-19 DIAGNOSIS — Z79899 Other long term (current) drug therapy: Secondary | ICD-10-CM

## 2017-10-19 DIAGNOSIS — Z8249 Family history of ischemic heart disease and other diseases of the circulatory system: Secondary | ICD-10-CM | POA: Diagnosis not present

## 2017-10-19 DIAGNOSIS — E1122 Type 2 diabetes mellitus with diabetic chronic kidney disease: Secondary | ICD-10-CM | POA: Diagnosis present

## 2017-10-19 DIAGNOSIS — Z8674 Personal history of sudden cardiac arrest: Secondary | ICD-10-CM | POA: Diagnosis not present

## 2017-10-19 DIAGNOSIS — Z7982 Long term (current) use of aspirin: Secondary | ICD-10-CM | POA: Diagnosis not present

## 2017-10-19 DIAGNOSIS — R0902 Hypoxemia: Secondary | ICD-10-CM

## 2017-10-19 DIAGNOSIS — F419 Anxiety disorder, unspecified: Secondary | ICD-10-CM | POA: Diagnosis present

## 2017-10-19 DIAGNOSIS — I340 Nonrheumatic mitral (valve) insufficiency: Secondary | ICD-10-CM | POA: Diagnosis not present

## 2017-10-19 DIAGNOSIS — I5033 Acute on chronic diastolic (congestive) heart failure: Secondary | ICD-10-CM | POA: Diagnosis not present

## 2017-10-19 DIAGNOSIS — Z91012 Allergy to eggs: Secondary | ICD-10-CM

## 2017-10-19 DIAGNOSIS — I2510 Atherosclerotic heart disease of native coronary artery without angina pectoris: Secondary | ICD-10-CM | POA: Diagnosis present

## 2017-10-19 DIAGNOSIS — J441 Chronic obstructive pulmonary disease with (acute) exacerbation: Secondary | ICD-10-CM | POA: Diagnosis present

## 2017-10-19 DIAGNOSIS — I10 Essential (primary) hypertension: Secondary | ICD-10-CM | POA: Diagnosis not present

## 2017-10-19 DIAGNOSIS — R079 Chest pain, unspecified: Secondary | ICD-10-CM | POA: Diagnosis present

## 2017-10-19 DIAGNOSIS — IMO0001 Reserved for inherently not codable concepts without codable children: Secondary | ICD-10-CM | POA: Diagnosis present

## 2017-10-19 HISTORY — DX: Heart failure, unspecified: I50.9

## 2017-10-19 LAB — CBC
HCT: 28.4 % — ABNORMAL LOW (ref 36.0–46.0)
Hemoglobin: 8.8 g/dL — ABNORMAL LOW (ref 12.0–15.0)
MCH: 21.2 pg — ABNORMAL LOW (ref 26.0–34.0)
MCHC: 31 g/dL (ref 30.0–36.0)
MCV: 68.4 fL — AB (ref 78.0–100.0)
PLATELETS: 330 10*3/uL (ref 150–400)
RBC: 4.15 MIL/uL (ref 3.87–5.11)
RDW: 22.9 % — AB (ref 11.5–15.5)
WBC: 13.2 10*3/uL — ABNORMAL HIGH (ref 4.0–10.5)

## 2017-10-19 LAB — BASIC METABOLIC PANEL
Anion gap: 13 (ref 5–15)
BUN: 7 mg/dL (ref 6–20)
CALCIUM: 8.5 mg/dL — AB (ref 8.9–10.3)
CHLORIDE: 104 mmol/L (ref 101–111)
CO2: 24 mmol/L (ref 22–32)
CREATININE: 1.24 mg/dL — AB (ref 0.44–1.00)
GFR calc Af Amer: 53 mL/min — ABNORMAL LOW (ref >60)
GFR, EST NON AFRICAN AMERICAN: 46 mL/min — AB (ref >60)
Glucose, Bld: 174 mg/dL — ABNORMAL HIGH (ref 65–99)
Potassium: 3 mmol/L — ABNORMAL LOW (ref 3.5–5.1)
SODIUM: 141 mmol/L (ref 135–145)

## 2017-10-19 LAB — I-STAT TROPONIN, ED: TROPONIN I, POC: 0.01 ng/mL (ref 0.00–0.08)

## 2017-10-19 LAB — BRAIN NATRIURETIC PEPTIDE: B NATRIURETIC PEPTIDE 5: 1539.6 pg/mL — AB (ref 0.0–100.0)

## 2017-10-19 MED ORDER — ONDANSETRON HCL 4 MG/2ML IJ SOLN: 4.0000 mg | Freq: Four times a day (QID) | INTRAMUSCULAR | Status: DC | PRN

## 2017-10-19 MED ORDER — GUAIFENESIN-DM 100-10 MG/5ML PO SYRP: 5.0000 mL | ORAL_SOLUTION | ORAL | Status: DC | PRN

## 2017-10-19 MED ORDER — CARVEDILOL 6.25 MG PO TABS
6.2500 mg | ORAL_TABLET | Freq: Two times a day (BID) | ORAL | Status: DC
  Administered 2017-10-20 – 2017-10-22 (×5): 6.25 mg via ORAL
  Filled 2017-10-19 (×5): qty 1

## 2017-10-19 MED ORDER — HYDRALAZINE HCL 50 MG PO TABS
100.0000 mg | ORAL_TABLET | Freq: Three times a day (TID) | ORAL | Status: DC
  Administered 2017-10-20 – 2017-10-23 (×9): 100 mg via ORAL
  Filled 2017-10-19 (×10): qty 2

## 2017-10-19 MED ORDER — AMLODIPINE BESYLATE 10 MG PO TABS
10.0000 mg | ORAL_TABLET | Freq: Every day | ORAL | Status: DC
  Administered 2017-10-20 – 2017-10-23 (×4): 10 mg via ORAL
  Filled 2017-10-19 (×2): qty 1
  Filled 2017-10-19: qty 2
  Filled 2017-10-19: qty 1

## 2017-10-19 MED ORDER — OXYCODONE-ACETAMINOPHEN 5-325 MG PO TABS
1.0000 | ORAL_TABLET | Freq: Once | ORAL | Status: AC
  Administered 2017-10-19: 1 via ORAL
  Filled 2017-10-19: qty 1

## 2017-10-19 MED ORDER — FUROSEMIDE 10 MG/ML IJ SOLN
40.0000 mg | Freq: Once | INTRAMUSCULAR | Status: AC
  Administered 2017-10-19: 40 mg via INTRAVENOUS
  Filled 2017-10-19: qty 4

## 2017-10-19 MED ORDER — SODIUM CHLORIDE 0.9% FLUSH
3.0000 mL | Freq: Two times a day (BID) | INTRAVENOUS | Status: DC
  Administered 2017-10-20 – 2017-10-23 (×8): 3 mL via INTRAVENOUS

## 2017-10-19 MED ORDER — SODIUM CHLORIDE 0.9 % IV SOLN: 250.0000 mL | INTRAVENOUS | Status: DC | PRN

## 2017-10-19 MED ORDER — ACETAMINOPHEN 500 MG PO TABS
500.0000 mg | ORAL_TABLET | Freq: Four times a day (QID) | ORAL | Status: DC | PRN
  Administered 2017-10-21 – 2017-10-22 (×4): 500 mg via ORAL
  Filled 2017-10-19 (×4): qty 1

## 2017-10-19 MED ORDER — ATORVASTATIN CALCIUM 40 MG PO TABS
40.0000 mg | ORAL_TABLET | Freq: Every day | ORAL | Status: DC
  Administered 2017-10-20 – 2017-10-22 (×3): 40 mg via ORAL
  Filled 2017-10-19 (×4): qty 1

## 2017-10-19 MED ORDER — ALBUTEROL SULFATE (2.5 MG/3ML) 0.083% IN NEBU: 2.5000 mg | INHALATION_SOLUTION | RESPIRATORY_TRACT | Status: DC | PRN

## 2017-10-19 MED ORDER — ASPIRIN EC 81 MG PO TBEC: 81.0000 mg | DELAYED_RELEASE_TABLET | Freq: Every day | ORAL | Status: DC

## 2017-10-19 MED ORDER — ASPIRIN EC 81 MG PO TBEC
81.0000 mg | DELAYED_RELEASE_TABLET | Freq: Every day | ORAL | Status: DC
  Administered 2017-10-20 – 2017-10-23 (×4): 81 mg via ORAL
  Filled 2017-10-19 (×4): qty 1

## 2017-10-19 MED ORDER — POTASSIUM CHLORIDE CRYS ER 20 MEQ PO TBCR
40.0000 meq | EXTENDED_RELEASE_TABLET | Freq: Once | ORAL | Status: AC
Start: 2017-10-19 — End: 2017-10-19
  Administered 2017-10-19: 40 meq via ORAL
  Filled 2017-10-19: qty 2

## 2017-10-19 MED ORDER — TRAMADOL HCL 50 MG PO TABS: 50.0000 mg | ORAL_TABLET | Freq: Three times a day (TID) | ORAL | Status: DC | PRN

## 2017-10-19 MED ORDER — MONTELUKAST SODIUM 10 MG PO TABS
10.0000 mg | ORAL_TABLET | Freq: Every day | ORAL | Status: DC
  Administered 2017-10-20 – 2017-10-22 (×3): 10 mg via ORAL
  Filled 2017-10-19 (×4): qty 1

## 2017-10-19 MED ORDER — INSULIN ASPART 100 UNIT/ML ~~LOC~~ SOLN
0.0000 [IU] | Freq: Three times a day (TID) | SUBCUTANEOUS | Status: DC
  Administered 2017-10-20: 2 [IU] via SUBCUTANEOUS
  Administered 2017-10-20: 3 [IU] via SUBCUTANEOUS
  Administered 2017-10-21: 7 [IU] via SUBCUTANEOUS
  Administered 2017-10-21: 3 [IU] via SUBCUTANEOUS
  Administered 2017-10-21: 2 [IU] via SUBCUTANEOUS
  Administered 2017-10-22: 3 [IU] via SUBCUTANEOUS
  Administered 2017-10-22 – 2017-10-23 (×3): 1 [IU] via SUBCUTANEOUS
  Filled 2017-10-19 (×2): qty 1

## 2017-10-19 MED ORDER — FLUOXETINE HCL 20 MG PO CAPS
20.0000 mg | ORAL_CAPSULE | Freq: Every day | ORAL | Status: DC
  Administered 2017-10-22 – 2017-10-23 (×2): 20 mg via ORAL
  Filled 2017-10-19 (×3): qty 1

## 2017-10-19 MED ORDER — GABAPENTIN 300 MG PO CAPS
300.0000 mg | ORAL_CAPSULE | Freq: Three times a day (TID) | ORAL | Status: DC
  Administered 2017-10-20 – 2017-10-23 (×10): 300 mg via ORAL
  Filled 2017-10-19 (×10): qty 1

## 2017-10-19 MED ORDER — IPRATROPIUM BROMIDE 0.02 % IN SOLN: 0.5000 mg | RESPIRATORY_TRACT | Status: DC

## 2017-10-19 MED ORDER — FUROSEMIDE 10 MG/ML IJ SOLN
40.0000 mg | Freq: Once | INTRAMUSCULAR | Status: AC
  Administered 2017-10-20: 40 mg via INTRAVENOUS
  Filled 2017-10-19: qty 4

## 2017-10-19 MED ORDER — NITROGLYCERIN 0.4 MG SL SUBL
0.4000 mg | SUBLINGUAL_TABLET | SUBLINGUAL | Status: DC | PRN
  Administered 2017-10-19 (×2): 0.4 mg via SUBLINGUAL
  Filled 2017-10-19: qty 1

## 2017-10-19 MED ORDER — ASPIRIN 81 MG PO CHEW
324.0000 mg | CHEWABLE_TABLET | Freq: Once | ORAL | Status: AC
  Administered 2017-10-19: 324 mg via ORAL
  Filled 2017-10-19: qty 4

## 2017-10-19 MED ORDER — MORPHINE SULFATE (PF) 4 MG/ML IV SOLN
1.0000 mg | INTRAVENOUS | Status: DC | PRN
  Administered 2017-10-20: 1 mg via INTRAVENOUS
  Filled 2017-10-19: qty 1

## 2017-10-19 MED ORDER — ZOLPIDEM TARTRATE 5 MG PO TABS
5.0000 mg | ORAL_TABLET | Freq: Every evening | ORAL | Status: DC | PRN
  Administered 2017-10-20: 5 mg via ORAL
  Filled 2017-10-19: qty 1

## 2017-10-19 MED ORDER — INSULIN GLARGINE 100 UNIT/ML ~~LOC~~ SOLN
14.0000 [IU] | Freq: Two times a day (BID) | SUBCUTANEOUS | Status: DC
Start: 2017-10-19 — End: 2017-10-23
  Administered 2017-10-20 – 2017-10-23 (×6): 14 [IU] via SUBCUTANEOUS
  Filled 2017-10-19 (×7): qty 0.14

## 2017-10-19 MED ORDER — LOSARTAN POTASSIUM 50 MG PO TABS
100.0000 mg | ORAL_TABLET | Freq: Every day | ORAL | Status: DC
  Administered 2017-10-21 – 2017-10-23 (×3): 100 mg via ORAL
  Filled 2017-10-19 (×3): qty 2

## 2017-10-19 MED ORDER — ALPRAZOLAM 0.25 MG PO TABS
0.2500 mg | ORAL_TABLET | Freq: Two times a day (BID) | ORAL | Status: DC | PRN
  Administered 2017-10-20 – 2017-10-22 (×4): 0.25 mg via ORAL
  Filled 2017-10-19 (×4): qty 1

## 2017-10-19 MED ORDER — METHYLPREDNISOLONE SODIUM SUCC 125 MG IJ SOLR
60.0000 mg | Freq: Two times a day (BID) | INTRAMUSCULAR | Status: DC
  Administered 2017-10-20 (×3): 60 mg via INTRAVENOUS
  Filled 2017-10-19 (×4): qty 2

## 2017-10-19 MED ORDER — SODIUM CHLORIDE 0.9% FLUSH: 3.0000 mL | INTRAVENOUS | Status: DC | PRN

## 2017-10-19 MED ORDER — LEVOFLOXACIN 500 MG PO TABS
500.0000 mg | ORAL_TABLET | Freq: Every day | ORAL | Status: DC
  Administered 2017-10-20: 500 mg via ORAL
  Filled 2017-10-19 (×2): qty 1

## 2017-10-19 MED ORDER — ENOXAPARIN SODIUM 40 MG/0.4ML ~~LOC~~ SOLN
40.0000 mg | SUBCUTANEOUS | Status: DC
  Administered 2017-10-20 – 2017-10-23 (×4): 40 mg via SUBCUTANEOUS
  Filled 2017-10-19 (×5): qty 0.4

## 2017-10-19 MED ORDER — LEVOFLOXACIN 500 MG PO TABS
500.0000 mg | ORAL_TABLET | Freq: Once | ORAL | Status: AC
  Administered 2017-10-20: 500 mg via ORAL
  Filled 2017-10-19: qty 1

## 2017-10-19 NOTE — H&P (Addendum)
History and Physical    Brenda Sims SVX:[id number] DOB: 1956/07/18 DOA: 10/19/2017  Referring MD/NP/PA:   PCP: Tresa Garter, MD   Patient coming from:  The patient is coming from home.  At baseline, pt is independent for most of ADL.   Chief Complaint: Cough, chest pain, shortness breath, fever, chills  HPI: Brenda Sims is a 62 y.o. female with medical history significant of hypertension, hyperlipidemia, diabetes mellitus, depression, ICD, CKD-III, seizure, CHF with EF 45%, recent cardiac arrest, who presents with cough, chest pain, SOB, fever and chills.  Patient is a poor historian, history is limited. Patient reports that she has been having cough, shortness of breath, chest pain, fever and chills for 2 or 3 days. She had temperature 100.5 at one time point. She coughs up foamy mucus. Her chest pain is located in the substernal area, intermittent, dull, moderate, nonradiating. She states that she had one loose stool bowel movement today, but currently no nausea, vomiting, diarrhea or abdominal pain. Patient denies symptoms of UTI or unilateral weakness. Of note, patient was recently hospitalized from 02/25-2/26 due to acute bronchitis. Patient complicated a course of doxycycline after went home home.  ED Course: pt was found to have WBC 13.2 BNP 1539, negative troponin, potassium 3.0, stable renal function, temperature 99.1, no tachycardia, has tachypnea, oxygen saturation to 80% on room air, chest x-ray showed pulmonary edema. Patient is admitted to stepdown as inpatient. BiPAP was started.  Review of Systems:   General: has fevers, chills, has poor appetite, has fatigue HEENT: no blurry vision, hearing changes or sore throat Respiratory: has dyspnea, coughing, no wheezing CV: has chest pain, no palpitations GI: currently no nausea, vomiting, abdominal pain, diarrhea, constipation GU: no dysuria, burning on urination, increased urinary frequency, hematuria  Ext: no leg  edema Neuro: no unilateral weakness, numbness, or tingling, no vision change or hearing loss Skin: no rash, no skin tear. MSK: No muscle spasm, no deformity, no limitation of range of movement in spin Heme: No easy bruising.  Travel history: No recent long distant travel.  Allergy:  Allergies  Allergen Reactions  . Eggs Or Egg-Derived Products Shortness Of Breath and Rash  . Azithromycin Swelling    Facial/throat swelling  . Cherry Swelling    Facial swelling    Past Medical History:  Diagnosis Date  . CKD (chronic kidney disease)   . Diabetes mellitus without complication ([place])   . Hypertension     Past Surgical History:  Procedure Laterality Date  . APPENDECTOMY    . BACK SURGERY    . LEFT HEART CATH AND CORONARY ANGIOGRAPHY N/A 09/11/2017   Procedure: LEFT HEART CATH AND CORONARY ANGIOGRAPHY;  Surgeon: Martinique, Peter M, MD;  Location: [facility];  Service: Cardiovascular;  Laterality: N/A;  . TOTAL KNEE ARTHROPLASTY Right 2007    Social History:  reports that  has never smoked. she has never used smokeless tobacco. She reports that she does not drink alcohol or use drugs.  Family History:  Family History  Problem Relation Age of Onset  . Hypertension Other      Prior to Admission medications   Medication Sig Start Date End Date Taking? Authorizing Provider  acetaminophen (TYLENOL) 500 MG tablet Take 1,000 mg by mouth every 6 (six) hours as needed for headache (pain).   Yes [provider]  amLODipine (NORVASC) 10 MG tablet Take 1 tablet (10 mg total) by mouth daily. 10/16/17  Yes Brayton Caves, PA-C  aspirin EC 81  MG tablet Take 1 tablet (81 mg total) by mouth daily. 10/16/17  Yes Ena Dawley, Tiffany S, PA-C  atorvastatin (LIPITOR) 40 MG tablet Take 1 tablet (40 mg total) by mouth at bedtime. 10/16/17  Yes Ena Dawley, Tiffany S, PA-C  carvedilol (COREG) 6.25 MG tablet Take 1 tablet (6.25 mg total) by mouth 2 (two) times daily with a meal. 10/16/17  Yes Ena Dawley, Tiffany S,  PA-C  FLUoxetine (PROZAC) 20 MG capsule Take 1 capsule (20 mg total) by mouth daily. 10/17/17  Yes Hammer, Aurora Mask, RPH-CPP  furosemide (LASIX) 20 MG tablet Take 1 tablet (20 mg total) by mouth daily. 10/16/17  Yes Ena Dawley, Tiffany S, PA-C  gabapentin (NEURONTIN) 300 MG capsule Take 1 capsule (300 mg total) by mouth 3 (three) times daily. 10/16/17  Yes Ena Dawley, Tiffany S, PA-C  hydrALAZINE (APRESOLINE) 100 MG tablet Take 1 tablet (100 mg total) by mouth 3 (three) times daily. 10/16/17  Yes Ena Dawley, Tiffany S, PA-C  insulin glargine (LANTUS) 100 unit/mL SOPN Inject 20 Units into the skin 2 (two) times daily.   Yes [provider]  losartan (COZAAR) 100 MG tablet Take 1 tablet (100 mg total) by mouth daily. 10/16/17  Yes Ena Dawley, Tiffany S, PA-C  metFORMIN (GLUCOPHAGE) 1000 MG tablet Take 1 tablet (1,000 mg total) by mouth 2 (two) times daily with a meal. 10/16/17  Yes Ena Dawley, Tiffany S, PA-C  montelukast (SINGULAIR) 10 MG tablet Take 1 tablet (10 mg total) by mouth at bedtime. 10/16/17  Yes Ena Dawley, Tiffany S, PA-C  potassium chloride SA (K-DUR,KLOR-CON) 20 MEQ tablet Take 1 tablet (20 mEq total) by mouth daily. 10/16/17  Yes Ena Dawley, Tiffany S, PA-C  traMADol (ULTRAM) 50 MG tablet Take 1 tablet (50 mg total) by mouth every 8 (eight) hours as needed. Patient taking differently: Take 50 mg by mouth every 8 (eight) hours as needed (pain).  10/16/17  Yes Brayton Caves, PA-C  ACCU-CHEK SOFTCLIX LANCETS lancets Use as instructed 10/16/17   Brayton Caves, PA-C  Blood Glucose Monitoring Suppl (ACCU-CHEK AVIVA) device Use as instructed 10/16/17 10/16/18  Brayton Caves, PA-C  Blood Glucose Monitoring Suppl (TRUE METRIX METER) DEVI 1 kit by Does not apply route 4 (four) times daily. 10/16/17   Brayton Caves, PA-C  glucose blood (ACCU-CHEK AVIVA) test strip Use as instructed 10/16/17   Brayton Caves, PA-C  glucose blood (TRUE METRIX BLOOD GLUCOSE TEST) test strip Use as instructed 10/16/17   Brayton Caves, PA-C   guaiFENesin-dextromethorphan (ROBITUSSIN DM) 100-10 MG/5ML syrup Take 5 mLs by mouth every 4 (four) hours as needed for cough. Patient not taking: Reported on 10/19/2017 10/08/17   Cristal Ford, DO  TRUEPLUS LANCETS 28G MISC 28 g by Does not apply route 4 (four) times daily. 10/16/17   Brayton Caves, PA-C    Physical Exam: Vitals:   10/19/17 2130 10/19/17 2230 10/19/17 2245 10/19/17 2300  BP: (!) 155/77 (!) 156/82 (!) 156/82 (!) 163/83  Pulse: 87 86 81 81  Resp:  20 (!) 27 (!) 26  Temp:      TempSrc:      SpO2: 92% 90% 96% 95%  Weight:      Height:       General: Not in acute distress HEENT:       Eyes: PERRL, EOMI, no scleral icterus.       ENT: No discharge from the ears and nose, no pharynx injection, no tonsillar enlargement.        Neck: has positive  JVD, no bruit, no mass felt. Heme: No neck lymph node enlargement. Cardiac: S1/S2, RRR, No murmurs, No gallops or rubs. Respiratory: has rhonchi bilaterally GI: Soft, nondistended, nontender, no rebound pain, no organomegaly, BS present. GU: No hematuria Ext: No pitting leg edema bilaterally. 2+DP/PT pulse bilaterally. Musculoskeletal: No joint deformities, No joint redness or warmth, no limitation of ROM in spin. Skin: No rashes.  Neuro: Alert, oriented X3, cranial nerves II-XII grossly intact, moves all extremities normally.  Psych: Patient is not psychotic, no suicidal or hemocidal ideation.  Labs on Admission: I have personally reviewed following labs and imaging studies  CBC: Recent Labs  Lab 10/19/17 2016  WBC 13.2*  HGB 8.8*  HCT 28.4*  MCV 68.4*  PLT 016   Basic Metabolic Panel: Recent Labs  Lab 10/16/17 0954 10/19/17 2016  NA 146* 141  K 3.2* 3.0*  CL 104 104  CO2 25 24  GLUCOSE 100* 174*  BUN 17 7  CREATININE 1.35* 1.24*  CALCIUM 8.8 8.5*   GFR: Estimated Creatinine Clearance: 47.1 mL/min (A) (by C-G formula based on SCr of 1.24 mg/dL (H)). Liver Function Tests: No results for input(s): AST,  ALT, ALKPHOS, BILITOT, PROT, ALBUMIN in the last 168 hours. No results for input(s): LIPASE, AMYLASE in the last 168 hours. No results for input(s): AMMONIA in the last 168 hours. Coagulation Profile: No results for input(s): INR, PROTIME in the last 168 hours. Cardiac Enzymes: No results for input(s): CKTOTAL, CKMB, CKMBINDEX, TROPONINI in the last 168 hours. BNP (last 3 results) No results for input(s): PROBNP in the last 8760 hours. HbA1C: No results for input(s): HGBA1C in the last 72 hours. CBG: No results for input(s): GLUCAP in the last 168 hours. Lipid Profile: No results for input(s): CHOL, HDL, LDLCALC, TRIG, CHOLHDL, LDLDIRECT in the last 72 hours. Thyroid Function Tests: No results for input(s): TSH, T4TOTAL, FREET4, T3FREE, THYROIDAB in the last 72 hours. Anemia Panel: No results for input(s): VITAMINB12, FOLATE, FERRITIN, TIBC, IRON, RETICCTPCT in the last 72 hours. Urine analysis:    Component Value Date/Time   COLORURINE YELLOW 08/30/2017 0159   APPEARANCEUR CLEAR 08/30/2017 0159   LABSPEC 1.010 08/30/2017 0159   PHURINE 5.0 08/30/2017 0159   GLUCOSEU 50 (A) 08/30/2017 0159   HGBUR NEGATIVE 08/30/2017 0159   BILIRUBINUR NEGATIVE 08/30/2017 0159   KETONESUR NEGATIVE 08/30/2017 0159   PROTEINUR 100 (A) 08/30/2017 0159   NITRITE NEGATIVE 08/30/2017 0159   LEUKOCYTESUR NEGATIVE 08/30/2017 0159   Sepsis Labs: _0 (procalcitonin:4,lacticidven:4) )No results found for this or any previous visit (from the past 240 hour(s)).   Radiological Exams on Admission: Dg Chest 2 View  Result Date: 10/19/2017 CLINICAL DATA:  Chest pain EXAM: CHEST - 2 VIEW COMPARISON:  CT 09/17/2017, 10/07/2017, 09/06/2017, 09/04/2017 radiographs FINDINGS: Increased diffuse bilateral ground-glass opacity. Small pleural effusions. Cardiomegaly with vascular congestion. Patchy confluent airspace disease at the bilateral lung bases. Aortic atherosclerosis. No pneumothorax. IMPRESSION: 1.  Cardiomegaly with vascular congestion and diffuse increased ground-glass opacities suspect for pulmonary edema 2. Small pleural effusions. More confluent airspace disease at the bases may reflect superimposed atelectasis or pneumonia Electronically Signed   By: Donavan Foil M.D.   On: 10/19/2017 20:36     EKG: Independently reviewed. Sinus rhythm, QTC 488, LAD, biphasic T-wave in precordial leads, LAE.  Assessment/Plan Principal Problem:   Acute respiratory failure with hypoxia (HCC) Active Problems:   Acute on chronic combined systolic and diastolic CHF (congestive heart failure) (HCC)   Essential hypertension   Type II  diabetes mellitus with renal manifestations (HCC)   Bronchitis   CKD (chronic kidney disease) stage 3, GFR 30-59 ml/min (HCC)   Hypokalemia   Normocytic normochromic anemia   HLD (hyperlipidemia)   Depression   Chest pain   Acute respiratory failure with hypoxia: Likely due to combination of CHF exacerbation and bronchitis. Pt does not have leg edema, but she has positive JVD, elevated BNP and pulmonary edema on chest x-ray, clinically consistent with mild CHF exacerbation. Patient had fever of 100.5, chills, leukocytosis, and rhonchi on auscultation, consistent with possible bronchitis.  -will admit patient to SDU as inpt - continue BiPAP - get ABG -Nasal cannula oxygen as needed to maintain O2 saturation 92% or greater -treat CHF and bronchitis as below  Bronchitis: -Nebulizers: scheduled Atrovent and prn Xopenex Nebs -Solu-Medrol 60 mg IV bid -Levaquin - continue Singulair -Mucinex for cough  -Urine S. pneumococcal antigen -Follow up blood culture x2, sputum culture, Flu pcr  Acute on chronic combined systolic and diastolic CHF: 2-D Echo on 09/03/17 showed EF of 45-50 percent. Patient has mild CHF exacerbation. -Lasix 40 mg daily by IV -Daily weights -strict I/O's -Low salt diet  Chest pain: Likely due to demand ischemia secondary to CHF exacerbation.  Initial troponin negative. - cycle CE q6 x3 and repeat EKG in the am  - prn Nitroglycerin, Morphine, and aspirin, lipitor, coreg - Risk factor stratification: will check FLP, UDS and A1C  - 2d echo  HTN:  -Continue home medications: Amlodipine, Coreg, oral hydralazine, Cozaar -IV hydralazine prn  HLD: -lipitor  Type II diabetes mellitus with renal manifestations ([place]): Last A1c 6.7 on 09/05/17, controled. Patient is taking metformin and Lantus at home -will decrease Lantus dose from  20-14 units daily -SSI  Hypokalemia: K=3.0 on admission. - Repleted - Check Mg level  Addendum: Mg=1.5 -will give 1 g of magnesium sulfate  CKD-III: stable. Baseline creatinine 1.2-1.3, her creatinine is 1.24, BUN 70 -Follow-up renal function. BMP  Normocytic normochromic anemia: Hemoglobin 8.8, which was a 10.3 on 10/08/17, slight rectal bleeding. -Follow-up by CBC  Depression: Stable, no suicidal or homicidal ideations. -Continue home medications: Prilosec   DVT ppx: SQ Lovenox Code Status: Full code Family Communication: None at bed side.  Disposition Plan:  Anticipate discharge back to previous home environment Consults called:  none Admission status:  SDU/inpation       Date of Service 10/19/2017    Ivor Costa [facility] Pager [phone]  If 7PM-7AM, please contact night-coverage [email or web address] Password [email or web address] 10/19/2017, 11:51 PM

## 2017-10-19 NOTE — ED Triage Notes (Signed)
Pt reports chest pain, nausea, subjective fevers, and cough with clear sputum for a few days. States tylenol not effective for pain. SP02 83% on room air in triage. Hx recent cardiac arrest. Pt screaming at RN in triage.

## 2017-10-20 ENCOUNTER — Inpatient Hospital Stay (HOSPITAL_COMMUNITY): Payer: Medicaid Other

## 2017-10-20 ENCOUNTER — Encounter (HOSPITAL_COMMUNITY): Payer: Self-pay

## 2017-10-20 ENCOUNTER — Other Ambulatory Visit: Payer: Self-pay

## 2017-10-20 DIAGNOSIS — I340 Nonrheumatic mitral (valve) insufficiency: Secondary | ICD-10-CM

## 2017-10-20 DIAGNOSIS — F329 Major depressive disorder, single episode, unspecified: Secondary | ICD-10-CM

## 2017-10-20 DIAGNOSIS — E785 Hyperlipidemia, unspecified: Secondary | ICD-10-CM

## 2017-10-20 DIAGNOSIS — I509 Heart failure, unspecified: Secondary | ICD-10-CM

## 2017-10-20 DIAGNOSIS — I10 Essential (primary) hypertension: Secondary | ICD-10-CM

## 2017-10-20 DIAGNOSIS — R079 Chest pain, unspecified: Secondary | ICD-10-CM

## 2017-10-20 DIAGNOSIS — J40 Bronchitis, not specified as acute or chronic: Secondary | ICD-10-CM

## 2017-10-20 DIAGNOSIS — I5033 Acute on chronic diastolic (congestive) heart failure: Secondary | ICD-10-CM

## 2017-10-20 LAB — INFLUENZA PANEL BY PCR (TYPE A & B)
INFLAPCR: NEGATIVE
INFLBPCR: NEGATIVE

## 2017-10-20 LAB — BASIC METABOLIC PANEL
ANION GAP: 13 (ref 5–15)
BUN: 8 mg/dL (ref 6–20)
CALCIUM: 8.4 mg/dL — AB (ref 8.9–10.3)
CHLORIDE: 105 mmol/L (ref 101–111)
CO2: 24 mmol/L (ref 22–32)
CREATININE: 1.29 mg/dL — AB (ref 0.44–1.00)
GFR calc non Af Amer: 44 mL/min — ABNORMAL LOW (ref >60)
GFR, EST AFRICAN AMERICAN: 51 mL/min — AB (ref >60)
GLUCOSE: 128 mg/dL — AB (ref 65–99)
Potassium: 3.3 mmol/L — ABNORMAL LOW (ref 3.5–5.1)
Sodium: 142 mmol/L (ref 135–145)

## 2017-10-20 LAB — MRSA PCR SCREENING: MRSA BY PCR: NEGATIVE

## 2017-10-20 LAB — CBG MONITORING, ED
GLUCOSE-CAPILLARY: 288 mg/dL — AB (ref 65–99)
Glucose-Capillary: 203 mg/dL — ABNORMAL HIGH (ref 65–99)
Glucose-Capillary: 192 mg/dL — ABNORMAL HIGH (ref 65–99)

## 2017-10-20 LAB — TROPONIN I
Troponin I: <0.03 ng/mL (ref <0.03)
Troponin I: <0.03 ng/mL (ref <0.03)

## 2017-10-20 LAB — GLUCOSE, CAPILLARY
GLUCOSE-CAPILLARY: 243 mg/dL — AB (ref 65–99)
Glucose-Capillary: 257 mg/dL — ABNORMAL HIGH (ref 65–99)

## 2017-10-20 LAB — LIPID PANEL
Cholesterol: 130 mg/dL (ref 0–200)
HDL: 34 mg/dL — AB (ref >40)
LDL CALC: 81 mg/dL (ref 0–99)
TRIGLYCERIDES: 74 mg/dL (ref <150)
Total CHOL/HDL Ratio: 3.8 RATIO
VLDL: 15 mg/dL (ref 0–40)

## 2017-10-20 LAB — RAPID URINE DRUG SCREEN, HOSP PERFORMED
AMPHETAMINES: NOT DETECTED
BENZODIAZEPINES: NOT DETECTED
Barbiturates: NOT DETECTED
COCAINE: NOT DETECTED
OPIATES: NOT DETECTED
Tetrahydrocannabinol: NOT DETECTED

## 2017-10-20 LAB — MAGNESIUM: MAGNESIUM: 1.5 mg/dL — AB (ref 1.7–2.4)

## 2017-10-20 LAB — ECHOCARDIOGRAM COMPLETE
Height: 64.5 in
Weight: 2560 oz

## 2017-10-20 LAB — HEMOGLOBIN A1C
Hgb A1c MFr Bld: 6.4 % — ABNORMAL HIGH (ref 4.8–5.6)
Mean Plasma Glucose: 136.98 mg/dL

## 2017-10-20 LAB — STREP PNEUMONIAE URINARY ANTIGEN: STREP PNEUMO URINARY ANTIGEN: NEGATIVE

## 2017-10-20 MED ORDER — IPRATROPIUM BROMIDE 0.02 % IN SOLN: 0.5000 mg | RESPIRATORY_TRACT | Status: DC | PRN

## 2017-10-20 MED ORDER — SPIRONOLACTONE 25 MG PO TABS
25.0000 mg | ORAL_TABLET | Freq: Every day | ORAL | Status: DC
  Administered 2017-10-20 – 2017-10-23 (×4): 25 mg via ORAL
  Filled 2017-10-20 (×4): qty 1

## 2017-10-20 MED ORDER — FUROSEMIDE 20 MG PO TABS
20.0000 mg | ORAL_TABLET | Freq: Every day | ORAL | Status: DC
  Administered 2017-10-20 – 2017-10-22 (×3): 20 mg via ORAL
  Filled 2017-10-20 (×3): qty 1

## 2017-10-20 MED ORDER — MAGNESIUM SULFATE IN D5W 1-5 GM/100ML-% IV SOLN
1.0000 g | Freq: Once | INTRAVENOUS | Status: AC
  Administered 2017-10-20: 1 g via INTRAVENOUS
  Filled 2017-10-20: qty 100

## 2017-10-20 NOTE — ED Notes (Addendum)
Pt nonrebreather decreased from 15L to 10 L. Per admitting, pt NPO until able to be on approx 5-6 L [place].

## 2017-10-20 NOTE — Progress Notes (Signed)
PROGRESS NOTE  Brenda Sims ERD:[id number] DOB: 1956/01/05 DOA: 10/19/2017 PCP: Tresa Garter, MD  HPI/Recap of past 24 hours: Brenda Sims is a 62 y.o. female with medical history significant of hypertension, hyperlipidemia, diabetes mellitus, depression, ICD, CKD-III, seizure, CHF with EF 45%, recent cardiac arrest, who presents with cough, chest pain, SOB, fever and chills.  10/20/2017: Patient seen and examined at her bedside in the ED she is on BiPAP with FiO2 40% and saturating mid 90's. Denies any chest pain, palpitations or dyspnea at rest.  Assessment/Plan: Principal Problem:   Acute respiratory failure with hypoxia (HCC) Active Problems:   Acute on chronic combined systolic and diastolic CHF (congestive heart failure) (HCC)   Essential hypertension   Type II diabetes mellitus with renal manifestations (HCC)   Bronchitis   CKD (chronic kidney disease) stage 3, GFR 30-59 ml/min (HCC)   Hypokalemia   Normocytic normochromic anemia   HLD (hyperlipidemia)   Depression   Chest pain  Acute hypoxic respiratory failure secondary to acute exacerbation of chronic diastolic heart failure and acute COPD exacerbation -Continue Lasix -Monitor I's and O's, daily weights -Limit sodium intake and continue fluid restriction -BNP elevated -Cardiology consult -last echo done today 10/20/17 revealed LVEF 50% with grade 2 diastolic dysfunction  Acute pulmonary edema most likely cardiogenic from acute on chronic systolic heart failure exacerbation -Management as stated above -Continue cardiac medications  Acute COPD exacerbation -Continue Levaquin 500 mg p.o. daily times 5 days Continue IV Solu-Medrol 60 mg IV twice daily Continue Atrovent neb every 4 hours as needed  Hypomagnesemia/hypokalemia -Repleted with potassium chloride supplement and magnesium supplement  Sleep disorder Ambien nightly  DM 2 -A1c 6.4  Previous A1c 6.7 -Continue ISS -Heart healthy diabetic diet- -on  metformin at home  Depression/anxiety -Continue Prozac  Hypertension Blood pressure stable Continue amlodipine   Code Status: Full  Family Communication: Normal bedside  Disposition Plan: Home when hemodynamically stable   Consultants: None  Procedures:  None  Antimicrobials:  None  DVT prophylaxis: SCDs   Objective: Vitals:   10/20/17 0745 10/20/17 0947 10/20/17 1000 10/20/17 1001  BP: (!) 167/79  (!) 157/80   Pulse: 82  69   Resp: (!) 27  19   Temp:      TempSrc:      SpO2: 98% 100% 100% 100%  Weight:      Height:        Intake/Output Summary (Last 24 hours) at 10/20/2017 1011 Last data filed at 10/20/2017 5631 Gross per 24 hour  Intake 100 ml  Output -  Net 100 ml   Filed Weights   10/19/17 2009  Weight: 72.6 kg (160 lb)    Exam:   General: 62 year old African-American female well-developed well-nourished no acute distress on BiPAP.  JVD present on the left side.  Cardiovascular: Regular rate and rhythm with no rubs or gallops  Respiratory: Diffuse rales bilaterally.  Abdomen: Soft nontender nondistended normal bowel sounds x4  Musculoskeletal: No focal abnormalities. No LE edema.  Skin: No rash  Psychiatry: Mood is appropriate for condition and setting   Data Reviewed: CBC: Recent Labs  Lab 10/19/17 2016  WBC 13.2*  HGB 8.8*  HCT 28.4*  MCV 68.4*  PLT 497   Basic Metabolic Panel: Recent Labs  Lab 10/16/17 0954 10/19/17 2016 10/19/17 2352 10/20/17 0444  NA 146* 141  --  142  K 3.2* 3.0*  --  3.3*  CL 104 104  --  105  CO2 25 24  --  24  GLUCOSE 100* 174*  --  128*  BUN 17 7  --  8  CREATININE 1.35* 1.24*  --  1.29*  CALCIUM 8.8 8.5*  --  8.4*  MG  --   --  1.5*  --    GFR: Estimated Creatinine Clearance: 45.3 mL/min (A) (by C-G formula based on SCr of 1.29 mg/dL (H)). Liver Function Tests: No results for input(s): AST, ALT, ALKPHOS, BILITOT, PROT, ALBUMIN in the last 168 hours. No results for input(s): LIPASE,  AMYLASE in the last 168 hours. No results for input(s): AMMONIA in the last 168 hours. Coagulation Profile: No results for input(s): INR, PROTIME in the last 168 hours. Cardiac Enzymes: Recent Labs  Lab 10/19/17 2352 10/20/17 0444  TROPONINI <0.03 <0.03   BNP (last 3 results) No results for input(s): PROBNP in the last 8760 hours. HbA1C: Recent Labs    10/20/17 0444  HGBA1C 6.4*   CBG: Recent Labs  Lab 10/20/17 0935  GLUCAP 203*   Lipid Profile: Recent Labs    10/20/17 0444  CHOL 130  HDL 34*  LDLCALC 81  TRIG 74  CHOLHDL 3.8   Thyroid Function Tests: No results for input(s): TSH, T4TOTAL, FREET4, T3FREE, THYROIDAB in the last 72 hours. Anemia Panel: No results for input(s): VITAMINB12, FOLATE, FERRITIN, TIBC, IRON, RETICCTPCT in the last 72 hours. Urine analysis:    Component Value Date/Time   COLORURINE YELLOW 08/30/2017 0159   APPEARANCEUR CLEAR 08/30/2017 0159   LABSPEC 1.010 08/30/2017 0159   PHURINE 5.0 08/30/2017 0159   GLUCOSEU 50 (A) 08/30/2017 0159   HGBUR NEGATIVE 08/30/2017 0159   BILIRUBINUR NEGATIVE 08/30/2017 0159   KETONESUR NEGATIVE 08/30/2017 0159   PROTEINUR 100 (A) 08/30/2017 0159   NITRITE NEGATIVE 08/30/2017 0159   LEUKOCYTESUR NEGATIVE 08/30/2017 0159   Sepsis Labs: @LABRCNTIP (procalcitonin:4,lacticidven:4)  ) Recent Results (from the past 240 hour(s))  Culture, respiratory (NON-Expectorated)     Status: None (Preliminary result)   Collection Time: 10/20/17 12:31 AM  Result Value Ref Range Status   Specimen Description SPUTUM  Final   Special Requests NONE  Final   Gram Stain   Final    FEW WBC PRESENT,BOTH PMN AND MONONUCLEAR MODERATE SQUAMOUS EPITHELIAL CELLS PRESENT ABUNDANT GRAM POSITIVE COCCI IN CHAINS IN CLUSTERS FEW GRAM NEGATIVE COCCI IN PAIRS RARE GRAM POSITIVE RODS Performed at [facility], [place] [address], [place], [place] [address]    Culture PENDING  Incomplete   Report Status PENDING  Incomplete       Studies: Dg Chest 2 View  Result Date: 10/19/2017 CLINICAL DATA:  Chest pain EXAM: CHEST - 2 VIEW COMPARISON:  CT 09/17/2017, 10/07/2017, 09/06/2017, 09/04/2017 radiographs FINDINGS: Increased diffuse bilateral ground-glass opacity. Small pleural effusions. Cardiomegaly with vascular congestion. Patchy confluent airspace disease at the bilateral lung bases. Aortic atherosclerosis. No pneumothorax. IMPRESSION: 1. Cardiomegaly with vascular congestion and diffuse increased ground-glass opacities suspect for pulmonary edema 2. Small pleural effusions. More confluent airspace disease at the bases may reflect superimposed atelectasis or pneumonia Electronically Signed   By: Donavan Foil M.D.   On: 10/19/2017 20:36    Scheduled Meds: . amLODipine  10 mg Oral Daily  . aspirin EC  81 mg Oral Daily  . atorvastatin  40 mg Oral QHS  . carvedilol  6.25 mg Oral BID WC  . enoxaparin (LOVENOX) injection  40 mg Subcutaneous Q24H  . FLUoxetine  20 mg Oral Daily  . gabapentin  300 mg Oral TID  . hydrALAZINE  100 mg Oral TID  . insulin aspart  0-9 Units Subcutaneous TID WC  . insulin glargine  14 Units Subcutaneous BID  . levofloxacin  500 mg Oral Daily  . losartan  100 mg Oral Daily  . methylPREDNISolone (SOLU-MEDROL) injection  60 mg Intravenous Q12H  . montelukast  10 mg Oral QHS  . sodium chloride flush  3 mL Intravenous Q12H    Continuous Infusions: . sodium chloride       LOS: 1 day     Kayleen Memos, MD [facility] Pager [phone]  If 7PM-7AM, please contact night-coverage [email or web address] Password [email or web address] 10/20/2017, 10:11 AM

## 2017-10-20 NOTE — ED Notes (Signed)
Pt refusing to use purewick at this time. Pt reports she still wets the bed despite the purewick being in proper position. Pt able to sit on chair at the side of the bed with O2 sat remaining at 98%. Will place bedside commode at the bedside for future use. Pt verbalized understanding that she is to call staff if she has to get up to void.

## 2017-10-20 NOTE — Progress Notes (Signed)
Took pt off BIPAP.  Pt tolerating well.  SPO2 100% on NRB.  Will titrate FIO2 as tolerated.  Pt wanting to eat.  Able to complete sentences with no distress.

## 2017-10-20 NOTE — Consult Note (Signed)
Cardiology Consultation:   Patient ID: Brenda Sims; [id number]; August 30, 1955   Admit date: 10/19/2017 Date of Consult: 10/20/2017  Primary Care Provider: Tresa Garter, MD Primary Cardiologist: Sinclair Grooms, MD  Primary Electrophysiologist:  n/a   Patient Profile:   Brenda Sims is a 62 y.o. female with a hx of  who is being seen today for the evaluation of  CAD, HTN, HL, DM2, CKD III, chronioc diasotlic HF, low normal to mildly decreased LVEF 45-50%. History of cardaic arrest Jan 2019 in setting of pneumonia and CHF with respiratory failure. Cath Jan 2019 shows 70% ostial ramus, 65% prox LCX treated medically. Seen for SOB at the request of Dr Nevada Crane.  History of Present Illness:   Brenda Sims 62 yo female history of nonobstructive CAD, HTN, HL, DM2, CKD III, chronioc diasotlic HF, low normal to mildly decreased LVEF 45-50%. History of cardaic arrest Jan 2019 in setting of pneumonia and CHF with respiratory failure. Cath Jan 2019 shows 70% ostial ramus, 65% prox LCX treated medically.   Reports several days of progression SOB/DOE. Denies any LE edema, no abdominal distension. +orthopnea. Fullness feeling in her chest, no specific chest pain.    K 3, Cr 1.24, BUN 7 WBC 13.2 Plt 330 BNP 1500 (up from 420) Mg 1.5  Trop neg x 3 CXR vasc  Congestion, pulm edema    Past Medical History:  Diagnosis Date  . CKD (chronic kidney disease)   . Diabetes mellitus without complication ([place])   . Hypertension     Past Surgical History:  Procedure Laterality Date  . APPENDECTOMY    . BACK SURGERY    . LEFT HEART CATH AND CORONARY ANGIOGRAPHY N/A 09/11/2017   Procedure: LEFT HEART CATH AND CORONARY ANGIOGRAPHY;  Surgeon: Martinique, Peter M, MD;  Location: [facility];  Service: Cardiovascular;  Laterality: N/A;  . TOTAL KNEE ARTHROPLASTY Right 2007       Inpatient Medications: Scheduled Meds: . amLODipine  10 mg Oral Daily  . aspirin EC  81 mg Oral Daily  . atorvastatin  40 mg  Oral QHS  . carvedilol  6.25 mg Oral BID WC  . enoxaparin (LOVENOX) injection  40 mg Subcutaneous Q24H  . FLUoxetine  20 mg Oral Daily  . furosemide  20 mg Oral Daily  . gabapentin  300 mg Oral TID  . hydrALAZINE  100 mg Oral TID  . insulin aspart  0-9 Units Subcutaneous TID WC  . insulin glargine  14 Units Subcutaneous BID  . levofloxacin  500 mg Oral Daily  . losartan  100 mg Oral Daily  . methylPREDNISolone (SOLU-MEDROL) injection  60 mg Intravenous Q12H  . montelukast  10 mg Oral QHS  . sodium chloride flush  3 mL Intravenous Q12H   Continuous Infusions: . sodium chloride     PRN Meds: sodium chloride, acetaminophen, albuterol, ALPRAZolam, guaiFENesin-dextromethorphan, ipratropium, morphine injection, nitroGLYCERIN, ondansetron (ZOFRAN) IV, sodium chloride flush, traMADol, zolpidem  Allergies:    Allergies  Allergen Reactions  . Eggs Or Egg-Derived Products Shortness Of Breath and Rash  . Azithromycin Swelling    Facial/throat swelling  . Cherry Swelling    Facial swelling    Social History:   Social History   Socioeconomic History  . Marital status: Unknown    Spouse name: Not on file  . Number of children: Not on file  . Years of education: Not on file  . Highest education level: Not on file  Social Needs  . Financial  resource strain: Not on file  . Food insecurity - worry: Not on file  . Food insecurity - inability: Not on file  . Transportation needs - medical: Not on file  . Transportation needs - non-medical: Not on file  Occupational History  . Not on file  Tobacco Use  . Smoking status: Never Smoker  . Smokeless tobacco: Never Used  Substance and Sexual Activity  . Alcohol use: No    Frequency: Never  . Drug use: No  . Sexual activity: Not on file  Other Topics Concern  . Not on file  Social History Narrative  . Not on file    Family History:    Family History  Problem Relation Age of Onset  . Hypertension Other      ROS:  Please see  the history of present illness.  All other ROS reviewed and negative.     Physical Exam/Data:   Vitals:   10/20/17 1230 10/20/17 1315 10/20/17 1330 10/20/17 1400  BP: (!) 157/80  (!) 176/76 (!) 150/67  Pulse: 78 73 78 75  Resp: (!) 24 17 (!) 22 20  Temp:      TempSrc:      SpO2: 96% 95% 95% 96%  Weight:      Height:        Intake/Output Summary (Last 24 hours) at 10/20/2017 1626 Last data filed at 10/20/2017 1056 Gross per 24 hour  Intake 103 ml  Output 600 ml  Net -497 ml   Filed Weights   10/19/17 2009  Weight: 160 lb (72.6 kg)   Body mass index is 27.04 kg/m.  General:  Well nourished, well developed, in no acute distress HEENT: normal Lymph: no adenopathy Neck: mildly elevated jvd Cardiac:  RRR, 2/6 systolic murmur  Lungs:  clear to auscultation bilaterally, no wheezing, rhonchi or rales  Abd: soft, nontender, no hepatomegaly  Ext: no edema Musculoskeletal:  No deformities, BUE and BLE strength normal and equal Skin: warm and dry  Neuro:  CNs 2-12 intact, no focal abnormalities noted Psych:  Normal affect     Laboratory Data:  Chemistry Recent Labs  Lab 10/16/17 0954 10/19/17 2016 10/20/17 0444  NA 146* 141 142  K 3.2* 3.0* 3.3*  CL 104 104 105  CO2 25 24 24   GLUCOSE 100* 174* 128*  BUN 17 7 8   CREATININE 1.35* 1.24* 1.29*  CALCIUM 8.8 8.5* 8.4*  GFRNONAA 42* 46* 44*  GFRAA 49* 53* 51*  ANIONGAP  --  13 13    No results for input(s): PROT, ALBUMIN, AST, ALT, ALKPHOS, BILITOT in the last 168 hours. Hematology Recent Labs  Lab 10/19/17 2016  WBC 13.2*  RBC 4.15  HGB 8.8*  HCT 28.4*  MCV 68.4*  MCH 21.2*  MCHC 31.0  RDW 22.9*  PLT 330   Cardiac Enzymes Recent Labs  Lab 10/19/17 2352 10/20/17 0444  TROPONINI <0.03 <0.03    Recent Labs  Lab 10/19/17 2024  TROPIPOC 0.01    BNP Recent Labs  Lab 10/19/17 2016  BNP 1,539.6*    DDimer No results for input(s): DDIMER in the last 168 hours.  Radiology/Studies:  Dg Chest 2  View  Result Date: 10/19/2017 CLINICAL DATA:  Chest pain EXAM: CHEST - 2 VIEW COMPARISON:  CT 09/17/2017, 10/07/2017, 09/06/2017, 09/04/2017 radiographs FINDINGS: Increased diffuse bilateral ground-glass opacity. Small pleural effusions. Cardiomegaly with vascular congestion. Patchy confluent airspace disease at the bilateral lung bases. Aortic atherosclerosis. No pneumothorax. IMPRESSION: 1. Cardiomegaly with vascular congestion and  diffuse increased ground-glass opacities suspect for pulmonary edema 2. Small pleural effusions. More confluent airspace disease at the bases may reflect superimposed atelectasis or pneumonia Electronically Signed   By: Donavan Foil M.D.   On: 10/19/2017 20:36   Jan 2019 cath  Cath 09/11/2017 Conclusion     Ost Ramus to Ramus lesion is 70% stenosed.  Prox Cx to Mid Cx lesion is 65% stenosed.  Mid RCA to Dist RCA lesion is 50% stenosed.  LV end diastolic pressure is mildly elevated.  There is moderate left ventricular systolic dysfunction.  The left ventricular ejection fraction is 35-45% by visual estimate.  1. Moderate nonobstructive CAD 2. Moderate LV dysfunction. 3. Mildly elevated LVEDP    10/2017 echo Study Conclusions  - Left ventricle: The cavity size was mildly dilated. Wall   thickness was increased in a pattern of mild LVH. Basal inferior   akinesis. The estimated ejection fraction was 50%. Features are   consistent with a pseudonormal left ventricular filling pattern,   with concomitant abnormal relaxation and increased filling   pressure (grade 2 diastolic dysfunction). - Aortic valve: There was no stenosis. - Mitral valve: Mildly calcified annulus. Mildly calcified leaflets   . There was mild to moderate regurgitation. - Left atrium: The atrium was severely dilated. - Right ventricle: The cavity size was normal. Systolic function   was normal. - Tricuspid valve: Peak RV-RA gradient (S): 25 mm Hg. - Pulmonary arteries: PA peak  pressure: 33 mm Hg (S). - Systemic veins: IVC measured 2.4 cm with > 50% respirophasic   variation, suggesting RA pressure 8 mmHg.  Impressions:  - Mildly dilated LV with mild LV hypertrophy. EF 50% with basal   inferior akinesis. Moderate diastolic dysfunction. Normal RV size   and systolic function. Severe left atrial enlargement. Mild to   moderate MR.  Assessment and Plan:   1. Acute on chronic diastolic - grade II diastoilc dysfunction by echo, low normal to mildly decreased LVEF by her recent series of echos - admitted with severe SOB, initially on NRB - CXR with pulm edema, significant increase in BNP - I/Os are incomplete. She received lasix 40mg  IV last night and this AM, symptoms improving. Overall stable renal function - due for additional lasix dose this evening, reevaluate in AM for continue diuresis needs.   2. COPD exacerbation - per primary team  3. HTN - significantly elevated bp's at times.  - she is on multiple agents. Could be exacerbated by solumedrol - start aldactone in setting of resistsant HTN.    For questions or updates, please contact [facility] Please consult [email or web address] for contact info under Cardiology/STEMI.   Merrily Pew, MD  10/20/2017 4:26 PM

## 2017-10-20 NOTE — ED Notes (Signed)
Pt given meal tray.

## 2017-10-20 NOTE — ED Provider Notes (Signed)
[facility] EMERGENCY DEPARTMENT Provider Note   CSN: [id number] Arrival date & time: 10/19/17  2004     History   Chief Complaint Chief Complaint  Patient presents with  . Chest Pain    HPI Brenda Sims is a 62 y.o. female.  HPI   62 year old female with a history of diabetes, hypertension, bronchitis, chronic combined systolic and diastolic heart failure, chronic kidney disease stage III, recent admission with concern for acute respiratory failure, presents with concern for worsening shortness of breath.  Patient reports that since she was discharged on 26 February, she has had continuing shortness of breath and cough.  Reports it has been worsening over the course of the last week.  Reports she has a cough productive of white foamy sputum.  Reports she has sharp central chest pain that is worsened with cough.  Reports she has had some increased leg swelling as well.  Reports that she has had fevers on and off, but is unable to say exactly how long that has been going on, reports they have been at 100.0.  Reports fatigue.  Reports she is taking lasix but it is not helping.   Past Medical History:  Diagnosis Date  . CKD (chronic kidney disease)   . Diabetes mellitus without complication ([place])   . Hypertension     Patient Active Problem List   Diagnosis Date Noted  . HLD (hyperlipidemia) 10/19/2017  . Depression 10/19/2017  . Chest pain 10/19/2017  . Hypoglycemia   . Hypoxia   . Bronchitis 10/07/2017  . Chronic combined systolic and diastolic CHF (congestive heart failure) ([place]) 10/07/2017  . CKD (chronic kidney disease) stage 3, GFR 30-59 ml/min (H[facility]) 10/07/2017  . Hypokalemia 10/07/2017  . Normocytic normochromic anemia 10/07/2017  . Type II diabetes mellitus with renal manifestations ([place])   . Leukocytosis   . Acute blood loss anemia   . Seizures ([place])   . Coronary artery disease involving native coronary artery of native heart with angina pectoris  ([place]) 09/05/2017  . Cardiac arrest ([place]) 09/03/2017  . Cardiopulmonary arrest ([place])   . Acute encephalopathy   . Acute respiratory failure with hypoxia ([place])   . Acute on chronic combined systolic and diastolic CHF (congestive heart failure) ([place]) 08/29/2017  . Essential hypertension 08/29/2017  . Type 1 diabetes mellitus without complication ([place]) 65/99/3570  . Tobacco abuse 08/29/2017  . Acute pulmonary edema ([facility])   . CKD (chronic kidney disease)   . Dyspnea on exertion     Past Surgical History:  Procedure Laterality Date  . APPENDECTOMY    . BACK SURGERY    . LEFT HEART CATH AND CORONARY ANGIOGRAPHY N/A 09/11/2017   Procedure: LEFT HEART CATH AND CORONARY ANGIOGRAPHY;  Surgeon: Martinique, Peter M, MD;  Location: [facility];  Service: Cardiovascular;  Laterality: N/A;  . TOTAL KNEE ARTHROPLASTY Right 2007    OB History    No data available       Home Medications    Prior to Admission medications   Medication Sig Start Date End Date Taking? Authorizing Provider  acetaminophen (TYLENOL) 500 MG tablet Take 1,000 mg by mouth every 6 (six) hours as needed for headache (pain).   Yes [provider]  amLODipine (NORVASC) 10 MG tablet Take 1 tablet (10 mg total) by mouth daily. 10/16/17  Yes Brayton Caves, PA-C  aspirin EC 81 MG tablet Take 1 tablet (81 mg total) by mouth daily. 10/16/17  Yes Brayton Caves, PA-C  atorvastatin (LIPITOR) 40 MG tablet Take 1 tablet (40 mg total) by mouth at bedtime. 10/16/17  Yes Ena Dawley, Tiffany S, PA-C  carvedilol (COREG) 6.25 MG tablet Take 1 tablet (6.25 mg total) by mouth 2 (two) times daily with a meal. 10/16/17  Yes Ena Dawley, Tiffany S, PA-C  FLUoxetine (PROZAC) 20 MG capsule Take 1 capsule (20 mg total) by mouth daily. 10/17/17  Yes Hammer, Aurora Mask, RPH-CPP  furosemide (LASIX) 20 MG tablet Take 1 tablet (20 mg total) by mouth daily. 10/16/17  Yes Ena Dawley, Tiffany S, PA-C  gabapentin (NEURONTIN) 300 MG capsule Take 1 capsule (300 mg total) by  mouth 3 (three) times daily. 10/16/17  Yes Ena Dawley, Tiffany S, PA-C  hydrALAZINE (APRESOLINE) 100 MG tablet Take 1 tablet (100 mg total) by mouth 3 (three) times daily. 10/16/17  Yes Ena Dawley, Tiffany S, PA-C  insulin glargine (LANTUS) 100 unit/mL SOPN Inject 20 Units into the skin 2 (two) times daily.   Yes [provider]  losartan (COZAAR) 100 MG tablet Take 1 tablet (100 mg total) by mouth daily. 10/16/17  Yes Ena Dawley, Tiffany S, PA-C  metFORMIN (GLUCOPHAGE) 1000 MG tablet Take 1 tablet (1,000 mg total) by mouth 2 (two) times daily with a meal. 10/16/17  Yes Ena Dawley, Tiffany S, PA-C  montelukast (SINGULAIR) 10 MG tablet Take 1 tablet (10 mg total) by mouth at bedtime. 10/16/17  Yes Ena Dawley, Tiffany S, PA-C  potassium chloride SA (K-DUR,KLOR-CON) 20 MEQ tablet Take 1 tablet (20 mEq total) by mouth daily. 10/16/17  Yes Ena Dawley, Tiffany S, PA-C  traMADol (ULTRAM) 50 MG tablet Take 1 tablet (50 mg total) by mouth every 8 (eight) hours as needed. Patient taking differently: Take 50 mg by mouth every 8 (eight) hours as needed (pain).  10/16/17  Yes Brayton Caves, PA-C  ACCU-CHEK SOFTCLIX LANCETS lancets Use as instructed 10/16/17   Brayton Caves, PA-C  Blood Glucose Monitoring Suppl (ACCU-CHEK AVIVA) device Use as instructed 10/16/17 10/16/18  Brayton Caves, PA-C  Blood Glucose Monitoring Suppl (TRUE METRIX METER) DEVI 1 kit by Does not apply route 4 (four) times daily. 10/16/17   Brayton Caves, PA-C  glucose blood (ACCU-CHEK AVIVA) test strip Use as instructed 10/16/17   Brayton Caves, PA-C  glucose blood (TRUE METRIX BLOOD GLUCOSE TEST) test strip Use as instructed 10/16/17   Brayton Caves, PA-C  guaiFENesin-dextromethorphan (ROBITUSSIN DM) 100-10 MG/5ML syrup Take 5 mLs by mouth every 4 (four) hours as needed for cough. Patient not taking: Reported on 10/19/2017 10/08/17   Cristal Ford, DO  TRUEPLUS LANCETS 28G MISC 28 g by Does not apply route 4 (four) times daily. 10/16/17   Brayton Caves, PA-C    Family  History Family History  Problem Relation Age of Onset  . Hypertension Other     Social History Social History   Tobacco Use  . Smoking status: Never Smoker  . Smokeless tobacco: Never Used  Substance Use Topics  . Alcohol use: No    Frequency: Never  . Drug use: No     Allergies   Eggs or egg-derived products; Azithromycin; and Cherry   Review of Systems Review of Systems  Constitutional: Positive for fatigue. Negative for fever.  HENT: Negative for sore throat.   Eyes: Negative for visual disturbance.  Respiratory: Positive for shortness of breath. Negative for cough.   Cardiovascular: Positive for chest pain and leg swelling.  Gastrointestinal: Negative for abdominal pain.  Genitourinary: Negative for difficulty urinating.  Musculoskeletal: Negative for  back pain and neck pain.  Skin: Negative for rash.  Neurological: Negative for syncope and headaches.     Physical Exam Updated Vital Signs BP (!) 161/82   Pulse 75   Temp 99.1 F (37.3 C) (Oral)   Resp 15   Ht 5' 4.5" (1.638 m)   Wt 72.6 kg (160 lb)   SpO2 94%   BMI 27.04 kg/m   Physical Exam  Constitutional: She is oriented to person, place, and time. She appears well-developed and well-nourished. No distress.  HENT:  Head: Normocephalic and atraumatic.  Eyes: Conjunctivae and EOM are normal.  Neck: Normal range of motion.  Cardiovascular: Normal rate, regular rhythm, normal heart sounds and intact distal pulses. Exam reveals no gallop and no friction rub.  No murmur heard. Pulmonary/Chest: Effort normal. Tachypnea noted. No respiratory distress. She has no wheezes. She has rales.  Abdominal: Soft. She exhibits no distension. There is no tenderness. There is no guarding.  Musculoskeletal: She exhibits no tenderness.       Right lower leg: She exhibits edema (mild).       Left lower leg: She exhibits edema (mild).  Neurological: She is alert and oriented to person, place, and time.  Skin: Skin is  warm and dry. No rash noted. She is not diaphoretic. No erythema.  Nursing note and vitals reviewed.    ED Treatments / Results  Labs (all labs ordered are listed, but only abnormal results are displayed) Labs Reviewed  BASIC METABOLIC PANEL - Abnormal; Notable for the following components:      Result Value   Potassium 3.0 (*)    Glucose, Bld 174 (*)    Creatinine, Ser 1.24 (*)    Calcium 8.5 (*)    GFR calc non Af Amer 46 (*)    GFR calc Af Amer 53 (*)    All other components within normal limits  CBC - Abnormal; Notable for the following components:   WBC 13.2 (*)    Hemoglobin 8.8 (*)    HCT 28.4 (*)    MCV 68.4 (*)    MCH 21.2 (*)    RDW 22.9 (*)    All other components within normal limits  BRAIN NATRIURETIC PEPTIDE - Abnormal; Notable for the following components:   B Natriuretic Peptide 1,539.6 (*)    All other components within normal limits  MAGNESIUM - Abnormal; Notable for the following components:   Magnesium 1.5 (*)    All other components within normal limits  CULTURE, BLOOD (ROUTINE X 2)  CULTURE, BLOOD (ROUTINE X 2)  CULTURE, EXPECTORATED SPUTUM-ASSESSMENT  INFLUENZA PANEL BY PCR (TYPE A & B)  RAPID URINE DRUG SCREEN, HOSP PERFORMED  TROPONIN I  BLOOD GAS, ARTERIAL  HEMOGLOBIN A1C  LIPID PANEL  TROPONIN I  TROPONIN I  HIV ANTIBODY (ROUTINE TESTING)  STREP PNEUMONIAE URINARY ANTIGEN  BASIC METABOLIC PANEL  I-STAT TROPONIN, ED    EKG  EKG Interpretation  Date/Time:  Saturday October 19 2017 20:11:49 EST Ventricular Rate:  88 PR Interval:  134 QRS Duration: 94 QT Interval:  404 QTC Calculation: 488 R Axis:   -64 Text Interpretation:  Normal sinus rhythm Left axis deviation Possible Anterior infarct , age undetermined Abnormal ECG No significant change since last tracing Confirmed by Gareth Morgan [phone]) on 10/19/2017 9:20:06 PM       Radiology Dg Chest 2 View  Result Date: 10/19/2017 CLINICAL DATA:  Chest pain EXAM: CHEST - 2 VIEW  COMPARISON:  CT 09/17/2017, 10/07/2017, 09/06/2017, 09/04/2017  radiographs FINDINGS: Increased diffuse bilateral ground-glass opacity. Small pleural effusions. Cardiomegaly with vascular congestion. Patchy confluent airspace disease at the bilateral lung bases. Aortic atherosclerosis. No pneumothorax. IMPRESSION: 1. Cardiomegaly with vascular congestion and diffuse increased ground-glass opacities suspect for pulmonary edema 2. Small pleural effusions. More confluent airspace disease at the bases may reflect superimposed atelectasis or pneumonia Electronically Signed   By: Donavan Foil M.D.   On: 10/19/2017 20:36    Procedures .Critical Care Performed by: Gareth Morgan, MD Authorized by: Gareth Morgan, MD   Critical care provider statement:    Critical care time (minutes):  30   Critical care was necessary to treat or prevent imminent or life-threatening deterioration of the following conditions:  Respiratory failure   Critical care was time spent personally by me on the following activities:  Ordering and review of laboratory studies, ordering and review of radiographic studies, examination of patient and evaluation of patient's response to treatment   (including critical care time)  Medications Ordered in ED Medications  nitroGLYCERIN (NITROSTAT) SL tablet 0.4 mg (0.4 mg Sublingual Given 10/19/17 2315)  furosemide (LASIX) injection 40 mg (not administered)  guaiFENesin-dextromethorphan (ROBITUSSIN DM) 100-10 MG/5ML syrup 5 mL (not administered)  acetaminophen (TYLENOL) tablet 500 mg (not administered)  amLODipine (NORVASC) tablet 10 mg (not administered)  atorvastatin (LIPITOR) tablet 40 mg (not administered)  carvedilol (COREG) tablet 6.25 mg (not administered)  FLUoxetine (PROZAC) capsule 20 mg (not administered)  gabapentin (NEURONTIN) capsule 300 mg (not administered)  hydrALAZINE (APRESOLINE) tablet 100 mg (not administered)  insulin glargine (LANTUS) Solostar Pen 14 Units (not  administered)  losartan (COZAAR) tablet 100 mg (not administered)  montelukast (SINGULAIR) tablet 10 mg (not administered)  traMADol (ULTRAM) tablet 50 mg (not administered)  morphine 4 MG/ML injection 1 mg (1 mg Intravenous Given 10/20/17 0032)  ipratropium (ATROVENT) nebulizer solution 0.5 mg (not administered)  albuterol (PROVENTIL) (2.5 MG/3ML) 0.083% nebulizer solution 2.5 mg (not administered)  methylPREDNISolone sodium succinate (SOLU-MEDROL) 125 mg/2 mL injection 60 mg (60 mg Intravenous Given 10/20/17 0033)  sodium chloride flush (NS) 0.9 % injection 3 mL (3 mLs Intravenous Given 10/20/17 0038)  sodium chloride flush (NS) 0.9 % injection 3 mL (not administered)  0.9 %  sodium chloride infusion (not administered)  ondansetron (ZOFRAN) injection 4 mg (not administered)  enoxaparin (LOVENOX) injection 40 mg (not administered)  aspirin EC tablet 81 mg (not administered)  zolpidem (AMBIEN) tablet 5 mg (5 mg Oral Given 10/20/17 0033)  ALPRAZolam (XANAX) tablet 0.25 mg (not administered)  insulin aspart (novoLOG) injection 0-9 Units (not administered)  levofloxacin (LEVAQUIN) tablet 500 mg (not administered)  oxyCODONE-acetaminophen (PERCOCET/ROXICET) 5-325 MG per tablet 1 tablet (1 tablet Oral Given 10/19/17 2101)  aspirin chewable tablet 324 mg (324 mg Oral Given 10/19/17 2310)  furosemide (LASIX) injection 40 mg (40 mg Intravenous Given 10/19/17 2309)  potassium chloride SA (K-DUR,KLOR-CON) CR tablet 40 mEq (40 mEq Oral Given 10/19/17 2310)  levofloxacin (LEVAQUIN) tablet 500 mg (500 mg Oral Given 10/20/17 0033)     Initial Impression / Assessment and Plan / ED Course  I have reviewed the triage vital signs and the nursing notes.  Pertinent labs & imaging results that were available during my care of the patient were reviewed by me and considered in my medical decision making (see chart for details).     62 year old female with a history of diabetes, hypertension, bronchitis, chronic  combined systolic and diastolic heart failure, chronic kidney disease stage III, NSTEMI, hx of cardiac arrest, recent admission  with concern for acute respiratory failure, presents with concern for worsening shortness of breath.  On arrival to the emergency department, her saturations are 82% on room air.  Chest x-ray shows signs of pulmonary edema.  EKG by me and shows no significant changes.  Concerns on history and physical exam is for worsening congestive heart failure.  BNP returned elevated to 1539.  Overall, suspect congestive heart failure more than pneumonia, but given history of possible fevers at home, did give a dose of Levaquin.  Ordered potassium, Lasix.  Patient's oxygen saturations initially were improved on nasal cannula, however then decreased while she is on 5-6 L down to 87-90%, and she was initiated on BiPAP.  Will admit to the stepdown unit for further care of acute congestive heart failure exacerbation.  Final Clinical Impressions(s) / ED Diagnoses   Final diagnoses:  Acute on chronic congestive heart failure, unspecified heart failure type [facility])    ED Discharge Orders    None       Gareth Morgan, MD 10/20/17 [phone]

## 2017-10-20 NOTE — ED Notes (Signed)
Echo at the bedside °

## 2017-10-20 NOTE — ED Notes (Signed)
Pt asking if she can eat after echo. Admitting paged by this RN

## 2017-10-20 NOTE — Progress Notes (Signed)
Pt admitted to 2W16 from ED, report received from [facility], [place]. Upon arrival, pt able to transfer self from stretcher to bed.  Pt placed on telemetry monitor.  Skin, cardiorespiratory, and admission assessment complete.  Pt arrived prior to receiving meal tray in ED.  Assisted pt with ordering meal tray.  As of this time, tray has not arrived.  Skin intact. PIV x1 to left AC flushed, no blood return, pt denies pain to site or with flush.  Pt aware of belonging policy. Belongings in room included in admission assessment documentation.  Pt in no acute distress.

## 2017-10-20 NOTE — Progress Notes (Signed)
  Echocardiogram 2D Echocardiogram has been performed.  Glendene Wyer G Akram Kissick 10/20/2017, 10:26 AM

## 2017-10-20 NOTE — ED Notes (Signed)
Ordered dinner to be delivered to 2w16

## 2017-10-20 NOTE — ED Notes (Signed)
Pt adamant about taking mask off to get something to eat or drink. This RN attempted to explain to pt that she is unable to tolerate being off the BiPAP for long, but pt still insists on having mask off.

## 2017-10-20 NOTE — ED Notes (Addendum)
Pt placed on 6L [place], O2 sat remained at 94-96%. Will order meal tray and continue to assess patient to prior to meal tray arriving to ensure O2 sat remains appropriate on [place].

## 2017-10-21 DIAGNOSIS — N183 Chronic kidney disease, stage 3 (moderate): Secondary | ICD-10-CM

## 2017-10-21 DIAGNOSIS — E876 Hypokalemia: Secondary | ICD-10-CM

## 2017-10-21 DIAGNOSIS — R0902 Hypoxemia: Secondary | ICD-10-CM

## 2017-10-21 DIAGNOSIS — I5043 Acute on chronic combined systolic (congestive) and diastolic (congestive) heart failure: Secondary | ICD-10-CM

## 2017-10-21 DIAGNOSIS — D649 Anemia, unspecified: Secondary | ICD-10-CM

## 2017-10-21 DIAGNOSIS — J9601 Acute respiratory failure with hypoxia: Secondary | ICD-10-CM

## 2017-10-21 DIAGNOSIS — E1122 Type 2 diabetes mellitus with diabetic chronic kidney disease: Secondary | ICD-10-CM

## 2017-10-21 LAB — BASIC METABOLIC PANEL
Anion gap: 11 (ref 5–15)
BUN: 17 mg/dL (ref 6–20)
CO2: 26 mmol/L (ref 22–32)
CREATININE: 1.35 mg/dL — AB (ref 0.44–1.00)
Calcium: 8.8 mg/dL — ABNORMAL LOW (ref 8.9–10.3)
Chloride: 102 mmol/L (ref 101–111)
GFR, EST AFRICAN AMERICAN: 48 mL/min — AB (ref >60)
GFR, EST NON AFRICAN AMERICAN: 41 mL/min — AB (ref >60)
Glucose, Bld: 280 mg/dL — ABNORMAL HIGH (ref 65–99)
Potassium: 3.2 mmol/L — ABNORMAL LOW (ref 3.5–5.1)
SODIUM: 139 mmol/L (ref 135–145)

## 2017-10-21 LAB — GLUCOSE, CAPILLARY
GLUCOSE-CAPILLARY: 212 mg/dL — AB (ref 65–99)
Glucose-Capillary: 310 mg/dL — ABNORMAL HIGH (ref 65–99)
Glucose-Capillary: 192 mg/dL — ABNORMAL HIGH (ref 65–99)
Glucose-Capillary: 318 mg/dL — ABNORMAL HIGH (ref 65–99)

## 2017-10-21 MED ORDER — POTASSIUM CHLORIDE CRYS ER 20 MEQ PO TBCR
40.0000 meq | EXTENDED_RELEASE_TABLET | Freq: Three times a day (TID) | ORAL | Status: AC
  Administered 2017-10-21 (×3): 40 meq via ORAL
  Filled 2017-10-21 (×3): qty 2

## 2017-10-21 MED ORDER — SODIUM CHLORIDE 3 % IN NEBU
4.0000 mL | INHALATION_SOLUTION | Freq: Three times a day (TID) | RESPIRATORY_TRACT | Status: DC
  Filled 2017-10-21: qty 4

## 2017-10-21 MED ORDER — INSULIN ASPART 100 UNIT/ML ~~LOC~~ SOLN
4.0000 [IU] | Freq: Three times a day (TID) | SUBCUTANEOUS | Status: DC
  Administered 2017-10-21: 4 [IU] via SUBCUTANEOUS

## 2017-10-21 MED ORDER — MAGNESIUM SULFATE 2 GM/50ML IV SOLN
2.0000 g | Freq: Once | INTRAVENOUS | Status: AC
  Administered 2017-10-21: 2 g via INTRAVENOUS
  Filled 2017-10-21: qty 50

## 2017-10-21 MED ORDER — SODIUM CHLORIDE 3 % IN NEBU
4.0000 mL | INHALATION_SOLUTION | Freq: Three times a day (TID) | RESPIRATORY_TRACT | Status: DC
  Administered 2017-10-21 – 2017-10-23 (×6): 4 mL via RESPIRATORY_TRACT
  Filled 2017-10-21 (×7): qty 4

## 2017-10-21 MED ORDER — ALBUTEROL SULFATE (2.5 MG/3ML) 0.083% IN NEBU: 2.5000 mg | INHALATION_SOLUTION | RESPIRATORY_TRACT | Status: DC | PRN

## 2017-10-21 MED ORDER — IPRATROPIUM-ALBUTEROL 0.5-2.5 (3) MG/3ML IN SOLN: 3.0000 mL | Freq: Four times a day (QID) | RESPIRATORY_TRACT | Status: DC

## 2017-10-21 MED ORDER — SODIUM CHLORIDE 0.9 % IV SOLN
100.0000 mg | Freq: Two times a day (BID) | INTRAVENOUS | Status: DC
  Administered 2017-10-21: 100 mg via INTRAVENOUS
  Filled 2017-10-21 (×2): qty 100

## 2017-10-21 MED ORDER — FUROSEMIDE 10 MG/ML IJ SOLN
60.0000 mg | Freq: Once | INTRAMUSCULAR | Status: AC
  Administered 2017-10-21: 60 mg via INTRAVENOUS
  Filled 2017-10-21: qty 6

## 2017-10-21 MED ORDER — IPRATROPIUM-ALBUTEROL 0.5-2.5 (3) MG/3ML IN SOLN
3.0000 mL | Freq: Four times a day (QID) | RESPIRATORY_TRACT | Status: DC
  Administered 2017-10-21 (×2): 3 mL via RESPIRATORY_TRACT
  Filled 2017-10-21 (×2): qty 3

## 2017-10-21 MED ORDER — IPRATROPIUM-ALBUTEROL 0.5-2.5 (3) MG/3ML IN SOLN
3.0000 mL | Freq: Three times a day (TID) | RESPIRATORY_TRACT | Status: DC
  Administered 2017-10-22 – 2017-10-23 (×4): 3 mL via RESPIRATORY_TRACT
  Filled 2017-10-21 (×4): qty 3

## 2017-10-21 MED ORDER — METHYLPREDNISOLONE SODIUM SUCC 125 MG IJ SOLR
60.0000 mg | Freq: Every day | INTRAMUSCULAR | Status: DC
  Administered 2017-10-21: 60 mg via INTRAVENOUS

## 2017-10-21 NOTE — Progress Notes (Addendum)
Inpatient Diabetes Program Recommendations  AACE/ADA: New Consensus Statement on Inpatient Glycemic Control (2015)  Target Ranges:  Prepandial:   less than 140 mg/dL      Peak postprandial:   less than 180 mg/dL (1-2 hours)      Critically ill patients:  140 - 180 mg/dL   Lab Results  Component Value Date   GLUCAP 310 (H) 10/21/2017   HGBA1C 6.4 (H) 10/20/2017    Review of Glycemic ControlResults for LINA, HITCH (MRN [id number]) as of 10/21/2017 15:10  Ref. Range 10/20/2017 17:15 10/20/2017 18:41 10/20/2017 22:09 10/21/2017 07:44 10/21/2017 11:16  Glucose-Capillary Latest Ref Range: 65 - 99 mg/dL 288 (H) 243 (H) 257 (H) 212 (H) 310 (H)    Diabetes history: Type 2 DM Outpatient Diabetes medications: Lantus 20 units bid, Metformin 1000 mg bid Current orders for Inpatient glycemic control:  Novolog sensitive tid with meals, Lantus 14 units bid Solumedrol 60 mg IV daily Inpatient Diabetes Program Recommendations:    Consider adding Novolog 4 units tid with meals (hold if patient eats less than 50%).  Will text page MD.   Thanks,  Adah Perl, RN, BC-ADM Inpatient Diabetes Coordinator Pager [phone] (8a-5p)

## 2017-10-21 NOTE — Progress Notes (Signed)
PROGRESS NOTE  Brenda Sims NOB:[id number] DOB: 04/28/1956 DOA: 10/19/2017 PCP: Tresa Garter, MD  HPI/Recap of past 24 hours: Brenda Sims is a 62 y.o. female with medical history significant of hypertension, hyperlipidemia, diabetes mellitus, depression, ICD, CKD-III, seizure, CHF with EF 45%, recent cardiac arrest, who presents with cough, chest pain, SOB, fever and chills.  10/20/2017: Patient seen and examined at her bedside in the ED she is on BiPAP with FiO2 40% and saturating mid 90's. Denies any chest pain, palpitations or dyspnea at rest.  10/21/2017: Patient seen and examined at her bedside.  States she wore her CPAP last night.  She is lying in the bed without any oxygen by nasal cannula.  With O2 saturation dropping in the low 80s.  Put back on O2 supplementation 3 L.  With improvement to 90-93%.  Patient denies chest pain dyspnea at rest or palpitation.  RN reports nonsustained V. tach 4 beats and 6 beats this morning, asymptomatic.  Cardiology following and made aware by RN.  Assessment/Plan: Principal Problem:   Acute respiratory failure with hypoxia (HCC) Active Problems:   Acute on chronic combined systolic and diastolic CHF (congestive heart failure) (HCC)   Essential hypertension   Type II diabetes mellitus with renal manifestations (HCC)   Bronchitis   CKD (chronic kidney disease) stage 3, GFR 30-59 ml/min (HCC)   Hypokalemia   Normocytic normochromic anemia   HLD (hyperlipidemia)   Depression   Chest pain  Acute hypoxic respiratory failure secondary to acute exacerbation of chronic diastolic heart failure and acute COPD exacerbation, improving -Continue Lasix p.o. 20 mg daily -Given 1 dose Lasix 60 mg -Monitor I's and O's, daily weights -Limit sodium intake and continue fluid restriction -BNP elevated on admission -Cardiology consulted and following -last echo done 10/20/17 revealed LVEF 50% with grade 2 diastolic dysfunction -Home O2 evaluation ordered  today -Repeat chest x-ray in the morning  Acute on chronic combined diastolic systolic heart failure with EF 50% -Management as stated above -Cardiology following.  Greatly appreciated -Continue cardiac meds -Patient on Coreg, Lasix, losartan, spironolactone  Hypertension, uncontrolled -On amlodipine, Coreg, losartan, Lasix, spironolactone -Cardiology following, will defer to cardiology -Hydralazine IV as needed for systolic blood pressure greater than 947 or diastolic blood pressure less than 105  Acute pulmonary edema most likely cardiogenic from acute on chronic systolic heart failure exacerbation, improving -Management as stated above -Continue cardiac medications -Continue diuresis  Self-reported history of OSA -States she left her CPAP in [place] recently moved in the area 6 months ago -Pulmonology outpatient follow-up after discharge -CPAP at night.  Acute COPD exacerbation -Continue Levaquin 500 mg p.o. daily times 5 days, day 1 out of 5  - wean off IV Solu-Medrol from 60 mg IV twice daily to daily -Continue Atrovent neb every 4 hours as needed -Added pulmonary toilet with hypersaline neb, chest PT, duo nebs every 6 hours and every 2 hours as needed  Hypomagnesemia/hypokalemia  -Potassium 3.2 -Repleted with potassium chloride supplement and magnesium supplement  CKD 3 -Avoid nephrotoxic agents/hypotension -Monitor urine output -If worsen will order renal ultrasound -Creatinine 1.35 from 1.29 -Baseline creatinine 1.2-1.3  Sleep disorder Ambien nightly  DM 2, well controlled -A1c 6.4  Previous A1c 6.7 -Continue ISS -Heart healthy diabetic diet- -on metformin at home  Depression/anxiety -Continue Prozac -Xanax 0.25 twice daily as needed  Hypertension Blood pressure stable Continue amlodipine   Code Status: Full  Family Communication: Normal bedside  Disposition Plan: Home possibly tomorrow 10/22/2017 or when cleared  by cardiology and passes home O2  evaluation  Consultants: Cardiology  Procedures:  None  Antimicrobials:  P.o. Levaquin  DVT prophylaxis: SCDs   Objective: Vitals:   10/21/17 0200 10/21/17 0300 10/21/17 0440 10/21/17 0740  BP:   (!) 182/99 (!) 161/56  Pulse: 91 88 82 79  Resp: 16 (!) 24 18 19   Temp:   (!) 97.2 F (36.2 C) 98.3 F (36.8 C)  TempSrc:   Axillary Oral  SpO2: 94% 93% 98%   Weight:      Height:        Intake/Output Summary (Last 24 hours) at 10/21/2017 1011 Last data filed at 10/21/2017 0904 Gross per 24 hour  Intake 1199 ml  Output 600 ml  Net 599 ml   Filed Weights   10/19/17 2009 10/20/17 1809 10/21/17 0111  Weight: 72.6 kg (160 lb) 81.6 kg (179 lb 14.3 oz) 80.7 kg (177 lb 14.6 oz)    Exam: 10/21/2017.  Patient seen and examined.  Physical exam is essentially unchanged  except for what is mentioned below.   General: 62 year old African-American female well-developed well-nourished no acute distress on BiPAP.  JVD present on the left side.  Cardiovascular: Regular rate and rhythm with no rubs or gallops  Respiratory: Mild diffuse rales bilaterally.  Abdomen: Soft nontender nondistended normal bowel sounds x4  Musculoskeletal: No focal abnormalities. No LE edema.  Skin: No rash  Psychiatry: Mood is appropriate for condition and setting   Data Reviewed: CBC: Recent Labs  Lab 10/19/17 2016  WBC 13.2*  HGB 8.8*  HCT 28.4*  MCV 68.4*  PLT 354   Basic Metabolic Panel: Recent Labs  Lab 10/16/17 0954 10/19/17 2016 10/19/17 2352 10/20/17 0444 10/21/17 0424  NA 146* 141  --  142 139  K 3.2* 3.0*  --  3.3* 3.2*  CL 104 104  --  105 102  CO2 25 24  --  24 26  GLUCOSE 100* 174*  --  128* 280*  BUN 17 7  --  8 17  CREATININE 1.35* 1.24*  --  1.29* 1.35*  CALCIUM 8.8 8.5*  --  8.4* 8.8*  MG  --   --  1.5*  --   --    GFR: Estimated Creatinine Clearance: 45 mL/min (A) (by C-G formula based on SCr of 1.35 mg/dL (H)). Liver Function Tests: No results for input(s):  AST, ALT, ALKPHOS, BILITOT, PROT, ALBUMIN in the last 168 hours. No results for input(s): LIPASE, AMYLASE in the last 168 hours. No results for input(s): AMMONIA in the last 168 hours. Coagulation Profile: No results for input(s): INR, PROTIME in the last 168 hours. Cardiac Enzymes: Recent Labs  Lab 10/19/17 2352 10/20/17 0444 10/20/17 1854  TROPONINI <0.03 <0.03 <0.03   BNP (last 3 results) No results for input(s): PROBNP in the last 8760 hours. HbA1C: Recent Labs    10/20/17 0444  HGBA1C 6.4*   CBG: Recent Labs  Lab 10/20/17 1155 10/20/17 1715 10/20/17 1841 10/20/17 2209 10/21/17 0744  GLUCAP 192* 288* 243* 257* 212*   Lipid Profile: Recent Labs    10/20/17 0444  CHOL 130  HDL 34*  LDLCALC 81  TRIG 74  CHOLHDL 3.8   Thyroid Function Tests: No results for input(s): TSH, T4TOTAL, FREET4, T3FREE, THYROIDAB in the last 72 hours. Anemia Panel: No results for input(s): VITAMINB12, FOLATE, FERRITIN, TIBC, IRON, RETICCTPCT in the last 72 hours. Urine analysis:    Component Value Date/Time   COLORURINE YELLOW 08/30/2017 0159   APPEARANCEUR  CLEAR 08/30/2017 0159   LABSPEC 1.010 08/30/2017 0159   PHURINE 5.0 08/30/2017 0159   GLUCOSEU 50 (A) 08/30/2017 0159   HGBUR NEGATIVE 08/30/2017 0159   BILIRUBINUR NEGATIVE 08/30/2017 0159   KETONESUR NEGATIVE 08/30/2017 0159   PROTEINUR 100 (A) 08/30/2017 0159   NITRITE NEGATIVE 08/30/2017 0159   LEUKOCYTESUR NEGATIVE 08/30/2017 0159   Sepsis Labs: @LABRCNTIP (procalcitonin:4,lacticidven:4)  ) Recent Results (from the past 240 hour(s))  Culture, respiratory (NON-Expectorated)     Status: None (Preliminary result)   Collection Time: 10/20/17 12:31 AM  Result Value Ref Range Status   Specimen Description SPUTUM  Final   Special Requests NONE  Final   Gram Stain   Final    FEW WBC PRESENT,BOTH PMN AND MONONUCLEAR MODERATE SQUAMOUS EPITHELIAL CELLS PRESENT ABUNDANT GRAM POSITIVE COCCI IN CHAINS IN CLUSTERS FEW GRAM  NEGATIVE COCCI IN PAIRS RARE GRAM POSITIVE RODS Performed at [facility], [place] [address] [place], [place] [address]    Culture PENDING  Incomplete   Report Status PENDING  Incomplete  MRSA PCR Screening     Status: None   Collection Time: 10/20/17  6:13 PM  Result Value Ref Range Status   MRSA by PCR NEGATIVE NEGATIVE Final    Comment:        The GeneXpert MRSA Assay (FDA approved for NASAL specimens only), is one component of a comprehensive MRSA colonization surveillance program. It is not intended to diagnose MRSA infection nor to guide or monitor treatment for MRSA infections. Performed at [facility], [place] [address] [place], [place] [address]       Studies: No results found.  Scheduled Meds: . amLODipine  10 mg Oral Daily  . aspirin EC  81 mg Oral Daily  . atorvastatin  40 mg Oral QHS  . carvedilol  6.25 mg Oral BID WC  . enoxaparin (LOVENOX) injection  40 mg Subcutaneous Q24H  . FLUoxetine  20 mg Oral Daily  . furosemide  60 mg Intravenous Once  . furosemide  20 mg Oral Daily  . gabapentin  300 mg Oral TID  . hydrALAZINE  100 mg Oral TID  . insulin aspart  0-9 Units Subcutaneous TID WC  . insulin glargine  14 Units Subcutaneous BID  . levofloxacin  500 mg Oral Daily  . losartan  100 mg Oral Daily  . methylPREDNISolone (SOLU-MEDROL) injection  60 mg Intravenous Q12H  . montelukast  10 mg Oral QHS  . potassium chloride  40 mEq Oral TID  . sodium chloride flush  3 mL Intravenous Q12H  . spironolactone  25 mg Oral Daily    Continuous Infusions: . sodium chloride    . magnesium sulfate 1 - 4 g bolus IVPB       LOS: 2 days     Kayleen Memos, MD [facility] Pager [phone]  If 7PM-7AM, please contact night-coverage [email or web address] Password [facility] 10/21/2017, 10:11 AM

## 2017-10-21 NOTE — Progress Notes (Signed)
Spoke w/ Cardiology NP re: 60mg  IV lasix not given this AM due to IV access loss, do we still want to give?/pts BP has still been high, despite scheduled meds. Per NP, hold lasix for now as MD will be by shortly to reasses pt/meds

## 2017-10-21 NOTE — Progress Notes (Signed)
Pt w/ 4 beat run of vtach followed by 6 beat run @0743 /0744. Asymptomatic. Will give AM cardiac meds and cont to monitor. Msged covering cardiologist, Radford Pax, and attending MD, Nevada Crane

## 2017-10-21 NOTE — Plan of Care (Signed)
Discussed care of plan with patient.  We covered proper diet for HTN and diabetes.  Patient verbalized she should cook her own food to control sodium levels.

## 2017-10-21 NOTE — Progress Notes (Signed)
Pt never received 0700 1x dose of 60mg  lasix as IV access lost. Messaged attending MD re: do we still want to give?

## 2017-10-21 NOTE — Progress Notes (Signed)
CSW consulted for patient housing concerns.  Pt interested in local low in come housing options since she is new to the area.  CSW provided with contact info for [place] to get information about applying for section 8 voucher or housing programs- also provided with list of local senior low in come housing sites.  Pt also inquiring about need for home O2 and home CPAP- CSW informed RNCM   Jorge Ny, [place] Social Worker [phone]

## 2017-10-21 NOTE — Progress Notes (Signed)
CM will continue to follow for possible home oxygen; B Pennie Rushing [phone]

## 2017-10-21 NOTE — Progress Notes (Addendum)
Progress Note  Patient Name: Brenda Sims Date of Encounter: 10/21/2017  Primary Cardiologist: Dr. Belva Crome   Subjective   Pt states that her breathing is better today. Still has some rhonchi and crackles on exam.    Inpatient Medications    Scheduled Meds: . amLODipine  10 mg Oral Daily  . aspirin EC  81 mg Oral Daily  . atorvastatin  40 mg Oral QHS  . carvedilol  6.25 mg Oral BID WC  . enoxaparin (LOVENOX) injection  40 mg Subcutaneous Q24H  . FLUoxetine  20 mg Oral Daily  . furosemide  60 mg Intravenous Once  . furosemide  20 mg Oral Daily  . gabapentin  300 mg Oral TID  . hydrALAZINE  100 mg Oral TID  . insulin aspart  0-9 Units Subcutaneous TID WC  . insulin glargine  14 Units Subcutaneous BID  . ipratropium-albuterol  3 mL Nebulization Q6H  . losartan  100 mg Oral Daily  . methylPREDNISolone (SOLU-MEDROL) injection  60 mg Intravenous Daily  . montelukast  10 mg Oral QHS  . potassium chloride  40 mEq Oral TID  . sodium chloride flush  3 mL Intravenous Q12H  . sodium chloride HYPERTONIC  4 mL Nebulization TID  . spironolactone  25 mg Oral Daily   Continuous Infusions: . sodium chloride    . doxycycline (VIBRAMYCIN) IV    . magnesium sulfate 1 - 4 g bolus IVPB 2 g (10/21/17 1226)   PRN Meds: sodium chloride, acetaminophen, albuterol, ALPRAZolam, guaiFENesin-dextromethorphan, ipratropium, morphine injection, nitroGLYCERIN, ondansetron (ZOFRAN) IV, sodium chloride flush, traMADol, zolpidem   Vital Signs    Vitals:   10/21/17 0740 10/21/17 1116 10/21/17 1150 10/21/17 1157  BP: (!) 161/56 (!) 193/98 (!) 189/99 (!) 171/82  Pulse: 79 88 84 83  Resp: 19 (!) 22 (!) 21 20  Temp: 98.3 F (36.8 C) 98.4 F (36.9 C)    TempSrc: Oral Oral    SpO2:  97% 91% 94%  Weight:      Height:        Intake/Output Summary (Last 24 hours) at 10/21/2017 1236 Last data filed at 10/21/2017 1226 Gross per 24 hour  Intake 1676 ml  Output 450 ml  Net 1226 ml   Filed  Weights   10/19/17 2009 10/20/17 1809 10/21/17 0111  Weight: 160 lb (72.6 kg) 179 lb 14.3 oz (81.6 kg) 177 lb 14.6 oz (80.7 kg)    Physical Exam   General: Well developed, well nourished, NAD Skin: Warm, dry, intact  Head: Normocephalic, atraumatic, clear, moist mucus membranes. Neck: Negative for carotid bruits. No JVD Lungs: Bilateral crackles in the bases, anterior and upper lung field rhonchi. No wheezes. Breathing is unlabored. Pt maintaining o2 saturations in the low 90's on RA.  Cardiovascular: RRR with S1 S2. No murmurs, rubs, or gallops Abdomen: Soft, non-tender, non-distended with normoactive bowel sounds. No obvious abdominal masses. MSK: Strength and tone appear normal for age. 5/5 in all extremities Extremities: No edema. No clubbing or cyanosis. DP/PT pulses 2+ bilaterally Neuro: Alert and oriented. No focal deficits. No facial asymmetry. MAE spontaneously. Psych: Responds to questions appropriately with normal affect.    Labs    Chemistry Recent Labs  Lab 10/19/17 2016 10/20/17 0444 10/21/17 0424  NA 141 142 139  K 3.0* 3.3* 3.2*  CL 104 105 102  CO2 24 24 26   GLUCOSE 174* 128* 280*  BUN 7 8 17   CREATININE 1.24* 1.29* 1.35*  CALCIUM 8.5*  8.4* 8.8*  GFRNONAA 46* 44* 41*  GFRAA 53* 51* 48*  ANIONGAP 13 13 11      Hematology Recent Labs  Lab 10/19/17 2016  WBC 13.2*  RBC 4.15  HGB 8.8*  HCT 28.4*  MCV 68.4*  MCH 21.2*  MCHC 31.0  RDW 22.9*  PLT 330    Cardiac Enzymes Recent Labs  Lab 10/19/17 2352 10/20/17 0444 10/20/17 1854  TROPONINI <0.03 <0.03 <0.03    Recent Labs  Lab 10/19/17 2024  TROPIPOC 0.01     BNP Recent Labs  Lab 10/19/17 2016  BNP 1,539.6*     DDimer No results for input(s): DDIMER in the last 168 hours.   Radiology    Dg Chest 2 View  Result Date: 10/19/2017 CLINICAL DATA:  Chest pain EXAM: CHEST - 2 VIEW COMPARISON:  CT 09/17/2017, 10/07/2017, 09/06/2017, 09/04/2017 radiographs FINDINGS: Increased diffuse  bilateral ground-glass opacity. Small pleural effusions. Cardiomegaly with vascular congestion. Patchy confluent airspace disease at the bilateral lung bases. Aortic atherosclerosis. No pneumothorax. IMPRESSION: 1. Cardiomegaly with vascular congestion and diffuse increased ground-glass opacities suspect for pulmonary edema 2. Small pleural effusions. More confluent airspace disease at the bases may reflect superimposed atelectasis or pneumonia Electronically Signed   By: Donavan Foil M.D.   On: 10/19/2017 20:36    Telemetry    10/21/17 NSR HR 85 - Personally Reviewed  ECG    10/21/17 NSR with HR 79 - Personally Reviewed  Cardiac Studies   Echo 10/20/17:  Study Conclusions  - Left ventricle: The cavity size was mildly dilated. Wall   thickness was increased in a pattern of mild LVH. Basal inferior   akinesis. The estimated ejection fraction was 50%. Features are   consistent with a pseudonormal left ventricular filling pattern,   with concomitant abnormal relaxation and increased filling   pressure (grade 2 diastolic dysfunction). - Aortic valve: There was no stenosis. - Mitral valve: Mildly calcified annulus. Mildly calcified leaflets   . There was mild to moderate regurgitation. - Left atrium: The atrium was severely dilated. - Right ventricle: The cavity size was normal. Systolic function   was normal. - Tricuspid valve: Peak RV-RA gradient (S): 25 mm Hg. - Pulmonary arteries: PA peak pressure: 33 mm Hg (S). - Systemic veins: IVC measured 2.4 cm with > 50% respirophasic   variation, suggesting RA pressure 8 mmHg.  Impressions:  - Mildly dilated LV with mild LV hypertrophy. EF 50% with basal   inferior akinesis. Moderate diastolic dysfunction. Normal RV size   and systolic function. Severe left atrial enlargement. Mild to   moderate MR.    Cath 09/11/2017 Conclusion     Ost Ramus to Ramus lesion is 70% stenosed.  Prox Cx to Mid Cx lesion is 65%  stenosed.  Mid RCA to Dist RCA lesion is 50% stenosed.  LV end diastolic pressure is mildly elevated.  There is moderate left ventricular systolic dysfunction.  The left ventricular ejection fraction is 35-45% by visual estimate.  1. Moderate nonobstructive CAD 2. Moderate LV dysfunction. 3. Mildly elevated LVEDP     Patient Profile     62 y.o. female history of nonobstructive CAD, HTN, HL, DM2, CKD III, chronioc diasotlic HF, low normal to mildly decreased LVEF 45-50%. History of cardaic arrest Jan 2019 in setting of pneumonia and CHF with respiratory failure. Cath Jan 2019 shows 70% ostial ramus, 65% prox LCX treated medically.   Assessment & Plan    1. Acute on chronic diastolic heart failure with mild  respiratory failure: -Patient admitted with severe shortness of breath> initially placed on NRB/Bipap with plans for diuresis -S/S of fluid volume overload on lung examination  -Troponin levels, <0.03, <0.03, <0.03 -Echocardiogram, 10/20/17 with low normal LVEF at 50% and grade 2 diastolic dysfunction -Chest x-ray consistent with pulmonary edema with elevated BNP=1,539 -Weight, 177lb, 160lb on admission (unclear of this admission weight is accurate) -I&O, net positive 729 ml, total output 450 ml today  -IV Lasix 40 mg ONCE, then IV Lasix 60 mg (not given secondary to lost IV access) ONCE>>transistioned to Lasix PO 20 mg on 10/20/17. Given her ongoing respiratory symptoms, CXR, and BNP, she would likely benefit from more aggressive diuresing. Consider IV therapy for one additional day, then PO ?  -Creatinine, 1.29>1.39 today. This appears to be an actual improvement from her baseline of 1.5-2.0 -Strict I&O, daily weights -CXR to be repeated in the AM  2.  COPD exacerbation: -Per primary team -No hx of tobacco use -Intermittent Bipap, CPAP last night  3.  HTN: -Elevated, 171/82>189/99>193/98>161/56 -Aldactone 25mg  started 10/20/2017 given resistant HTN, If BP's continue to  remain elevated, consider increasing. -Amlodipine 10 mg, carvedilol 6.25 mg, hydralazine 100 mg, losartan 100 mg>>she has been on clonidine in the past  -Unsure of further options>>will discuss care with MD -K+=3.2>>replaced per primary   4. DM II: -Per primary team -SSI, HbA1c=6.4   5. HLD: Stable, CHO-130, HDL-34, LDL-81, Trig-74 -Lipitor  6. OSA: -Per primary team -Will need home CPAP>>pulm to follow OP   Signed, Kathyrn Drown NP-C [facility] Pager: [phone] 10/21/2017, 12:36 PM     I have seen and examined the patient along with Kathyrn Drown NP-C.  I have reviewed the chart, notes and new data.  I agree with NP's note.  Key new complaints: feeling much better, able to lie almost fully flat in bed. Key examination changes: still appears a little tachypneic at rest. Hard to evaluate JVP. No edema. Key new findings / data: K low, but has been receiving KCl supplements throughout the day  PLAN: She now has IV access and will give her the dose of diuretic ordered earlier today. Suspect we can just place on oral diuretics tomorrow.  She reports that she weighed 160 lb a month ago (177 today). No sure how accurate that report is , but will try to get fluid down to weight of around 170.  Sanda Klein, MD, [place] [phone] 10/21/2017, 5:09 PM   For questions or updates, please contact   Please consult [email or web address] for contact info under Cardiology/STEMI.

## 2017-10-22 ENCOUNTER — Telehealth: Payer: Self-pay

## 2017-10-22 ENCOUNTER — Telehealth: Payer: Self-pay | Admitting: *Deleted

## 2017-10-22 ENCOUNTER — Inpatient Hospital Stay (HOSPITAL_COMMUNITY): Payer: Medicaid Other

## 2017-10-22 LAB — BASIC METABOLIC PANEL
ANION GAP: 9 (ref 5–15)
BUN: 24 mg/dL — AB (ref 6–20)
CO2: 29 mmol/L (ref 22–32)
Calcium: 8.5 mg/dL — ABNORMAL LOW (ref 8.9–10.3)
Chloride: 103 mmol/L (ref 101–111)
Creatinine, Ser: 1.22 mg/dL — ABNORMAL HIGH (ref 0.44–1.00)
GFR calc Af Amer: 54 mL/min — ABNORMAL LOW (ref >60)
GFR calc non Af Amer: 47 mL/min — ABNORMAL LOW (ref >60)
GLUCOSE: 137 mg/dL — AB (ref 65–99)
POTASSIUM: 3.2 mmol/L — AB (ref 3.5–5.1)
Sodium: 141 mmol/L (ref 135–145)

## 2017-10-22 LAB — GLUCOSE, CAPILLARY
GLUCOSE-CAPILLARY: 229 mg/dL — AB (ref 65–99)
GLUCOSE-CAPILLARY: 249 mg/dL — AB (ref 65–99)
Glucose-Capillary: 87 mg/dL (ref 65–99)
Glucose-Capillary: 126 mg/dL — ABNORMAL HIGH (ref 65–99)

## 2017-10-22 LAB — CULTURE, RESPIRATORY

## 2017-10-22 LAB — HIV ANTIBODY (ROUTINE TESTING W REFLEX): HIV Screen 4th Generation wRfx: NONREACTIVE

## 2017-10-22 LAB — CULTURE, RESPIRATORY W GRAM STAIN: Culture: NORMAL

## 2017-10-22 MED ORDER — FUROSEMIDE 40 MG PO TABS: 40.0000 mg | ORAL_TABLET | Freq: Every day | ORAL | Status: DC

## 2017-10-22 MED ORDER — HYDRALAZINE HCL 20 MG/ML IJ SOLN
5.0000 mg | Freq: Once | INTRAMUSCULAR | Status: AC
  Administered 2017-10-22: 5 mg via INTRAVENOUS
  Filled 2017-10-22: qty 1

## 2017-10-22 MED ORDER — DOXYCYCLINE HYCLATE 100 MG PO TABS
100.0000 mg | ORAL_TABLET | Freq: Two times a day (BID) | ORAL | Status: DC
  Administered 2017-10-22 – 2017-10-23 (×3): 100 mg via ORAL
  Filled 2017-10-22 (×3): qty 1

## 2017-10-22 MED ORDER — PREDNISONE 20 MG PO TABS
20.0000 mg | ORAL_TABLET | Freq: Every day | ORAL | Status: DC
  Administered 2017-10-22 – 2017-10-23 (×2): 20 mg via ORAL
  Filled 2017-10-22 (×2): qty 1

## 2017-10-22 MED ORDER — INSULIN ASPART 100 UNIT/ML ~~LOC~~ SOLN
4.0000 [IU] | Freq: Three times a day (TID) | SUBCUTANEOUS | Status: DC
  Administered 2017-10-22 – 2017-10-23 (×4): 4 [IU] via SUBCUTANEOUS

## 2017-10-22 MED ORDER — MAGNESIUM SULFATE 2 GM/50ML IV SOLN
2.0000 g | Freq: Once | INTRAVENOUS | Status: AC
  Administered 2017-10-22: 2 g via INTRAVENOUS
  Filled 2017-10-22: qty 50

## 2017-10-22 MED ORDER — CARVEDILOL 25 MG PO TABS
25.0000 mg | ORAL_TABLET | Freq: Two times a day (BID) | ORAL | Status: DC
  Administered 2017-10-23: 25 mg via ORAL
  Filled 2017-10-22: qty 1

## 2017-10-22 MED ORDER — SENNA 8.6 MG PO TABS
1.0000 | ORAL_TABLET | Freq: Two times a day (BID) | ORAL | Status: DC
  Administered 2017-10-22 – 2017-10-23 (×2): 8.6 mg via ORAL
  Filled 2017-10-22 (×2): qty 1

## 2017-10-22 MED ORDER — HYDRALAZINE HCL 20 MG/ML IJ SOLN
10.0000 mg | Freq: Once | INTRAMUSCULAR | Status: AC
  Administered 2017-10-22: 10 mg via INTRAVENOUS
  Filled 2017-10-22: qty 1

## 2017-10-22 MED ORDER — FUROSEMIDE 40 MG PO TABS
40.0000 mg | ORAL_TABLET | Freq: Two times a day (BID) | ORAL | Status: DC
  Administered 2017-10-22 – 2017-10-23 (×2): 40 mg via ORAL
  Filled 2017-10-22 (×2): qty 1

## 2017-10-22 MED ORDER — HYDRALAZINE HCL 20 MG/ML IJ SOLN
10.0000 mg | Freq: Four times a day (QID) | INTRAMUSCULAR | Status: DC | PRN
  Administered 2017-10-22: 10 mg via INTRAVENOUS
  Filled 2017-10-22: qty 1

## 2017-10-22 MED ORDER — CARVEDILOL 12.5 MG PO TABS
12.5000 mg | ORAL_TABLET | Freq: Two times a day (BID) | ORAL | Status: DC
  Administered 2017-10-22: 12.5 mg via ORAL
  Filled 2017-10-22: qty 1

## 2017-10-22 MED ORDER — POTASSIUM CHLORIDE CRYS ER 20 MEQ PO TBCR
40.0000 meq | EXTENDED_RELEASE_TABLET | Freq: Two times a day (BID) | ORAL | Status: AC
  Administered 2017-10-22 – 2017-10-23 (×3): 40 meq via ORAL
  Filled 2017-10-22 (×3): qty 2

## 2017-10-22 NOTE — Progress Notes (Signed)
Pts K is 3.2 again this AM. Messaged attending MD

## 2017-10-22 NOTE — Progress Notes (Signed)
Per attending MD note, " cont fluid restrictions." Have been recording I/Os, however we don't have an order for fluid restriction. Spoke w/ Dr. [place] Crane who states will put in 1576ml fluid restriction.

## 2017-10-22 NOTE — Evaluation (Signed)
Physical Therapy Evaluation Patient Details Name: Brenda Sims MRN: [id number] DOB: June 02, 1956 Today's Date: 10/22/2017   History of Present Illness  Brenda Sims is a 62 y.o. female with medical history significant of hypertension, hyperlipidemia, diabetes mellitus, depression, ICD, CKD-III, seizure, CHF with EF 45%, recent cardiac arrest, who presents with cough, chest pain, SOB, fever and chills.  Clinical Impression  Patient presents close to functional baseline without follow up PT needs at this time.  No acute PT needs identified either.  Note to follow about oxygen requirement with mobility.  Will sign off.     Follow Up Recommendations No PT follow up    Equipment Recommendations  None recommended by PT    Recommendations for Other Services       Precautions / Restrictions Precautions Precautions: None      Mobility  Bed Mobility Overal bed mobility: Modified Independent                Transfers Overall transfer level: Modified independent                  Ambulation/Gait Ambulation/Gait assistance: Independent Ambulation Distance (Feet): 300 Feet Assistive device: None Gait Pattern/deviations: Step-through pattern;WFL(Within Functional Limits)     General Gait Details: grossly WFL, note to follow about oxygen requirements with mobility  Stairs            Wheelchair Mobility    Modified Rankin (Stroke Patients Only)       Balance Overall balance assessment: No apparent balance deficits (not formally assessed)(was able to pick items up off the floor in her room unaided)                                           Pertinent Vitals/Pain Pain Assessment: No/denies pain    Home Living Family/patient expects to be discharged to:: Private residence Living Arrangements: Spouse/significant other Available Help at Discharge: Family;Available 24 hours/day Type of Home: House Home Access: Stairs to enter;Ramped  entrance Entrance Stairs-Rails: Right;Left Entrance Stairs-Number of Steps: 3 Home Layout: One level Home Equipment: Shower seat      Prior Function Level of Independence: Independent               Hand Dominance        Extremity/Trunk Assessment        Lower Extremity Assessment Lower Extremity Assessment: Overall WFL for tasks assessed       Communication   Communication: No difficulties  Cognition Arousal/Alertness: Awake/alert Behavior During Therapy: WFL for tasks assessed/performed Overall Cognitive Status: Within Functional Limits for tasks assessed                                        General Comments      Exercises     Assessment/Plan    PT Assessment Patent does not need any further PT services  PT Problem List         PT Treatment Interventions      PT Goals (Current goals can be found in the Care Plan section)  Acute Rehab PT Goals PT Goal Formulation: All assessment and education complete, DC therapy    Frequency     Barriers to discharge        Co-evaluation  AM-PAC PT "6 Clicks" Daily Activity  Outcome Measure Difficulty turning over in bed (including adjusting bedclothes, sheets and blankets)?: None Difficulty moving from lying on back to sitting on the side of the bed? : None Difficulty sitting down on and standing up from a chair with arms (e.g., wheelchair, bedside commode, etc,.)?: None Help needed moving to and from a bed to chair (including a wheelchair)?: None Help needed walking in hospital room?: None Help needed climbing 3-5 steps with a railing? : None 6 Click Score: 24    End of Session Equipment Utilized During Treatment: Oxygen Activity Tolerance: Patient tolerated treatment well Patient left: in bed;with call bell/phone within reach;with nursing/sitter in room   PT Visit Diagnosis: Difficulty in walking, not elsewhere classified (R26.2)    Time: 7253-6644 PT Time  Calculation (min) (ACUTE ONLY): 29 min   Charges:   PT Evaluation $PT Eval Low Complexity: 1 Low PT Treatments $Gait Training: 8-22 mins   PT G CodesMagda Sims, [place] [phone] 10/22/2017   Brenda Sims 10/22/2017, 11:05 AM

## 2017-10-22 NOTE — Progress Notes (Signed)
Pt w/ no BM since 3/9. Msgd attending MD re:"Can we get something for constipation?"

## 2017-10-22 NOTE — Care Management Note (Addendum)
Case Management Note  Patient Details  Name: Brenda Sims MRN: [id number] Date of Birth: 1956-06-08  Subjective/Objective:   From home presents with Acute respiratory failure with hypoxia, acute on chronic systoli and diastolic CHF, DM2, CKD, HTN, Bronchitis, HLD, chest pain, depression, normocytic anemia, COPD.  She states she had a cpap machine before but she does not have any longer, per MD she will have a pulmonologist follow up at discharge. Also Patient states if she needs home oxygen she would like to go thru [facility].  Also she chose [facility] from [employer] for [facility] for disease management for CHF /COPD. Will need HHRN orders with face to face.     3/13 [place], BSN - patient informed [name] with [facility] clinic she did not want a HHRN, she is staying with somone else and will need to get permission from them, NCM will inform [name] with [facility].             Action/Plan: DC home .  Expected Discharge Date:                  Expected Discharge Plan:  [place] [facility](Patient may benefit from Disease management [place] services)  In-House Referral:  Clinical Social Work  Discharge planning Services  CM Consult  Post Acute Care Choice:  Home Health Choice offered to:  Patient  DME Arranged:    DME Agency:     HH Arranged:  Disease Management [place] Agency:  [place]  Status of Service:  In process, will continue to follow  If discussed at Long Length of Stay Meetings, dates discussed:    Additional Comments:  [name], RN 10/22/2017, 2:19 PM

## 2017-10-22 NOTE — Progress Notes (Addendum)
Progress Note  Patient Name: Brenda Sims Date of Encounter: 10/22/2017  Primary Cardiologist: Dr. Daneen Schick  Subjective   Pt doing well today. Weight down one pound from yesterday. Lasix transitioned to PO 40 mg once per day per primary. Breathing improved, however still has diffuse rhonchi throughout.   Inpatient Medications    Scheduled Meds: . amLODipine  10 mg Oral Daily  . aspirin EC  81 mg Oral Daily  . atorvastatin  40 mg Oral QHS  . carvedilol  6.25 mg Oral BID WC  . doxycycline  100 mg Oral Q12H  . enoxaparin (LOVENOX) injection  40 mg Subcutaneous Q24H  . FLUoxetine  20 mg Oral Daily  . furosemide  20 mg Oral Daily  . gabapentin  300 mg Oral TID  . hydrALAZINE  100 mg Oral TID  . insulin aspart  0-9 Units Subcutaneous TID WC  . insulin aspart  4 Units Subcutaneous TID WC  . insulin glargine  14 Units Subcutaneous BID  . ipratropium-albuterol  3 mL Nebulization TID  . losartan  100 mg Oral Daily  . montelukast  10 mg Oral QHS  . potassium chloride  40 mEq Oral BID  . predniSONE  20 mg Oral Q breakfast  . sodium chloride flush  3 mL Intravenous Q12H  . sodium chloride HYPERTONIC  4 mL Nebulization TID  . spironolactone  25 mg Oral Daily   Continuous Infusions: . sodium chloride     PRN Meds: sodium chloride, acetaminophen, albuterol, ALPRAZolam, guaiFENesin-dextromethorphan, ipratropium, morphine injection, nitroGLYCERIN, ondansetron (ZOFRAN) IV, sodium chloride flush, traMADol, zolpidem   Vital Signs    Vitals:   10/22/17 0427 10/22/17 0720 10/22/17 0915 10/22/17 1126  BP: (!) 152/119 (!) 174/93  (!) 169/78  Pulse:  85  79  Resp:  18  17  Temp:  98.2 F (36.8 C)  98.3 F (36.8 C)  TempSrc:  Oral  Oral  SpO2:  94% 94%   Weight:      Height:        Intake/Output Summary (Last 24 hours) at 10/22/2017 1139 Last data filed at 10/22/2017 0723 Gross per 24 hour  Intake 730 ml  Output 4800 ml  Net -4070 ml   Filed Weights   10/20/17 1809  10/21/17 0111 10/22/17 0106  Weight: 179 lb 14.3 oz (81.6 kg) 177 lb 14.6 oz (80.7 kg) 176 lb 12.9 oz (80.2 kg)    Physical Exam   General: Well developed, well nourished, NAD Skin: Warm, dry, intact  Head: Normocephalic, atraumatic, clear, moist mucus membranes. Neck: Negative for carotid bruits. No JVD Lungs: Bilateral crackles in the upper and lower fields. No wheezes. Breathing is unlabored. Cardiovascular: RRR with S1 S2. No murmurs, rubs, or gallops Abdomen: Soft, non-tender, non-distended with normoactive bowel sounds. No obvious abdominal masses. MSK: Strength and tone appear normal for age. 5/5 in all extremities Extremities: No edema. No clubbing or cyanosis. DP/PT pulses 2+ bilaterally Neuro: Alert and oriented. No focal deficits. No facial asymmetry. MAE spontaneously. Psych: Responds to questions appropriately with normal affect.    Labs    Chemistry Recent Labs  Lab 10/20/17 0444 10/21/17 0424 10/22/17 0337  NA 142 139 141  K 3.3* 3.2* 3.2*  CL 105 102 103  CO2 24 26 29   GLUCOSE 128* 280* 137*  BUN 8 17 24*  CREATININE 1.29* 1.35* 1.22*  CALCIUM 8.4* 8.8* 8.5*  GFRNONAA 44* 41* 47*  GFRAA 51* 48* 54*  ANIONGAP 13 11 9  Hematology Recent Labs  Lab 10/19/17 2016  WBC 13.2*  RBC 4.15  HGB 8.8*  HCT 28.4*  MCV 68.4*  MCH 21.2*  MCHC 31.0  RDW 22.9*  PLT 330    Cardiac Enzymes Recent Labs  Lab 10/19/17 2352 10/20/17 0444 10/20/17 1854  TROPONINI <0.03 <0.03 <0.03    Recent Labs  Lab 10/19/17 2024  TROPIPOC 0.01     BNP Recent Labs  Lab 10/19/17 2016  BNP 1,539.6*     DDimer No results for input(s): DDIMER in the last 168 hours.   Radiology    Dg Chest Port 1 View  Result Date: 10/22/2017 CLINICAL DATA:  Hypoxia. EXAM: PORTABLE CHEST 1 VIEW COMPARISON:  10/19/2017 FINDINGS: The heart is enlarged but stable. Persistent pulmonary edema but improved since prior study. No pleural effusions. IMPRESSION: Stable cardiac enlargement.  Persistent but improved pulmonary edema. Electronically Signed   By: Marijo Sanes M.D.   On: 10/22/2017 09:24   Telemetry    10/22/2017 NSR HR 92- Personally Reviewed  ECG    No new tracings of the 10/22/2017- Personally Reviewed  Cardiac Studies   Echo 10/20/17:  Study Conclusions - Left ventricle: The cavity size was mildly dilated. Wall thickness was increased in a pattern of mild LVH. Basal inferior akinesis. The estimated ejection fraction was 50%. Features are consistent with a pseudonormal left ventricular filling pattern, with concomitant abnormal relaxation and increased filling pressure (grade 2 diastolic dysfunction). - Aortic valve: There was no stenosis. - Mitral valve: Mildly calcified annulus. Mildly calcified leaflets . There was mild to moderate regurgitation. - Left atrium: The atrium was severely dilated. - Right ventricle: The cavity size was normal. Systolic function was normal. - Tricuspid valve: Peak RV-RA gradient (S): 25 mm Hg. - Pulmonary arteries: PA peak pressure: 33 mm Hg (S). - Systemic veins: IVC measured 2.4 cm with > 50% respirophasic variation, suggesting RA pressure 8 mmHg.  Impressions:  - Mildly dilated LV with mild LV hypertrophy. EF 50% with basal inferior akinesis. Moderate diastolic dysfunction. Normal RV size and systolic function. Severe left atrial enlargement. Mild to moderate MR.  Cath 09/11/2017 Conclusion     Ost Ramus to Ramus lesion is 70% stenosed.  Prox Cx to Mid Cx lesion is 65% stenosed.  Mid RCA to Dist RCA lesion is 50% stenosed.  LV end diastolic pressure is mildly elevated.  There is moderate left ventricular systolic dysfunction.  The left ventricular ejection fraction is 35-45% by visual estimate.  1. Moderate nonobstructive CAD 2. Moderate LV dysfunction. 3. Mildly elevated LVEDP   Patient Profile     62 y.o. female history of nonobstructive CAD, HTN, HL, DM2, CKD  III, chronioc diasotlic HF, low normal to mildly decreased LVEF 45-50%. History of cardaic arrest Jan 2019 in setting of pneumonia and CHF with respiratory failure. Cath Jan 2019 shows 70% ostial ramus, 65% prox LCX treated medically.  Assessment & Plan    1.  Acute on chronic diastolic heart failure with mild respiratory failure: -Patient admitted with severe shortness of breath, initially placed on NRB/BiPAP with plans for diuresis -s/s of fluid volume overload on exam -Troponin levels negative -Echocardiogram 10/20/2017 with low normal LVEF at 50% and grade 2 diastolic dysfunction -Chest x-ray consistent with pulmonary edema and elevated BNP on arrival to the ED, 1,539 -Weight, 176lb down one pound from yesterday 10/21/2017.  Goal is to diuresis until weight is approximately 170lb -I&O, net -3.3 L since admission, total output today 650 mL -Cr, 1.22  today, improved from 1.35 yesterday. Baseline creatinine appears to be in the 1.5-2.0 range -Lasix 40 mg PO QD added today per primary>>>may be beneficial to increase to BID?   -Discussed diet and medication compliance, as well as strict follow up as OP to prevent further hospitalizations  2.  COPD exacerbation: -Per primary team -No history of tobacco use -Intermittent BiPAP, CPAP p.m.  3.  HTN: -Elevated, -Aldactone 25 mg started 10/20/2017 given resistant HTN. -Amlodipine 10 mg, carvedilol 6.25 mg, hydralazine 100 mg, losartan 100 mg  4.  DM 2: -Per primary team -SSI, HbA1C=6.4  5.  HLD: -Stable, CHO-130, HDL-34, LDL-81, Trig-74 -Continue Lipitor  6.  OSA: -Per primary team -Will need home CPAP with pulmonary to follow outpatient   Signed, Kathyrn Drown NP-C [facility] Pager: [phone] 10/22/2017, 11:39 AM      I have seen and examined the patient along with Kathyrn Drown NP-C.  I have reviewed the chart, notes and new data.  I agree with PA/NP's note.  Key new complaints: feels better. Maintaining net negative fluid  balance and slight drop in weight after transition to oral diuretics. Key examination changes: BP high. no rales, JVP appears slightly elevated (7-8 cm), but hard to evaluate accurately Key new findings / data: creat improved since yesterday and better than previously assumed baseline. Brief NSVT on monitor.  PLAN: Increase carvedilol. Agree with twice daily oral diuretic. Try to diurese another 5-6 lb.  Sanda Klein, MD, [place] [phone] 10/22/2017, 1:49 PM  For questions or updates, please contact   Please consult [email or web address] for contact info under Cardiology/STEMI.

## 2017-10-22 NOTE — Telephone Encounter (Signed)
Call placed to Tomi Bamberger, RN CM about scheduling hospital follow up for the patient at [facility] as she is known to the clinic.   An appointment with the [facility] was scheduled for 11/01/17 @ 1000 and the information was placed on the AVS. This CM to plan to meet with the patient tomorrow prior to discharge.

## 2017-10-22 NOTE — Progress Notes (Signed)
SATURATION QUALIFICATIONS: (This note is used to comply with regulatory documentation for home oxygen)  Patient Saturations on Room Air at Rest = 91%  Patient Saturations on Room Air while Ambulating = 85%  Patient Saturations on 2 Liters of oxygen while Ambulating = 94%  Please briefly explain why patient needs home oxygen:  Patient demonstrates hypoxia ambulating on RA.  [place], [place] 10/22/2017

## 2017-10-22 NOTE — Progress Notes (Signed)
Pts BP still very elevated today despite scheduled cardiac meds and PRN dose of hydralazine (see vitals). Messaged covering cardiac NP

## 2017-10-22 NOTE — Telephone Encounter (Signed)
Notes recorded by Brayton Caves, PA-C on 10/18/2017 at 7:54 AM EST Confirm that she is taking her K+. Otherwise encourage her to keep follow up appt and will need additional blood work that visit. Thanks.  Left message on voicemail to return to call. Pt needs to set up an appointment to establish care. Seen by Zettie Pho, P.A.C on  March 8th.

## 2017-10-22 NOTE — Progress Notes (Addendum)
PROGRESS NOTE  Brenda Sims OVF:[id number] DOB: 1956-04-01 DOA: 10/19/2017 PCP: Tresa Garter, MD  HPI/Recap of past 24 hours: Brenda Sims is a 62 y.o. female with medical history significant of hypertension, hyperlipidemia, diabetes mellitus, depression, ICD, CKD-III, seizure, CHF with EF 45%, recent cardiac arrest, who presents with cough, chest pain, SOB, fever and chills.  10/20/2017: Patient seen and examined at her bedside in the ED she is on BiPAP with FiO2 40% and saturating mid 90's. Denies any chest pain, palpitations or dyspnea at rest.  10/21/2017: Patient seen and examined at her bedside.  States she wore her CPAP last night.  She is lying in the bed without any oxygen by nasal cannula.  With O2 saturation dropping in the low 80s.  Put back on O2 supplementation 3 L.  With improvement to 90-93%.  Patient denies chest pain dyspnea at rest or palpitation.  RN reports nonsustained V. tach 4 beats and 6 beats this morning, asymptomatic.  Cardiology following and made aware by RN.  10/22/2017: Patient seen and examined.  She denies dyspnea at rest chest pain or palpitations.  Home O2 evaluation done.  Patient will require 2 L at discharge unless she passes repeat home O2 evaluation tomorrow.  Chest x-ray as reviewed by myself showed improvement in her pulmonary edema.  However may need to continue to be diuresis also needs better blood pressure control.  Defer to cardiology.   Assessment/Plan: Principal Problem:   Acute respiratory failure with hypoxia (HCC) Active Problems:   Acute on chronic combined systolic and diastolic CHF (congestive heart failure) (HCC)   Essential hypertension   Type II diabetes mellitus with renal manifestations (HCC)   Bronchitis   CKD (chronic kidney disease) stage 3, GFR 30-59 ml/min (HCC)   Hypokalemia   Normocytic normochromic anemia   HLD (hyperlipidemia)   Depression   Chest pain  Acute hypoxic respiratory failure secondary to acute  exacerbation of chronic diastolic heart failure and acute COPD exacerbation, improving -Lasix dose increased from 20 mg daily to 40 mg daily -Monitor I's and O's, daily weights -Limit sodium intake and continue fluid restriction -BNP elevated on admission -Cardiology consulted and following -last echo done 10/20/17 revealed LVEF 50% with grade 2 diastolic dysfunction -Home O2 evaluation revealed hypoxia upon ambulation may require 2 L at discharge -Repeat chest x-ray revealed improvement in an increased pulmonary vascularity  Acute on chronic combined diastolic systolic heart failure with EF 50% -Management as stated above -Cardiology following.  Greatly appreciated -Continue cardiac meds -Patient on Coreg, Lasix, losartan, spironolactone  Hypertension, uncontrolled -On amlodipine, Coreg, losartan, Lasix, spironolactone, hydralazine 100 mg tid -Cardiology following, will defer to cardiology to change blood pressure medications -Hydralazine IV as needed for systolic blood pressure greater than 841 or diastolic blood pressure less than 105  Acute pulmonary edema most likely cardiogenic from acute on chronic systolic heart failure exacerbation, improving -Management as stated above -Continue cardiac medications -Continue diuresis  Self-reported history of OSA -States she left her CPAP in [place] recently moved in the area 6 months ago -Pulmonology outpatient follow-up after discharge -CPAP at night.  Acute COPD exacerbation -Continue Levaquin 500 mg p.o. daily times 5 days, day 2 out of 5  - wean off IV Solu-Medrol from 60 mg IV twice daily to daily -Continue Atrovent neb every 4 hours as needed -Added pulmonary toilet with hypersaline neb, chest PT, duo nebs every 6 hours and every 2 hours as needed  Hypomagnesemia/hypokalemia  -Most likely secondary to diuretics -  Potassium 3.2 -Repleted with potassium chloride supplement and magnesium supplement  CKD 3, improving -Avoid  nephrotoxic agents/hypotension -Monitor urine output -If worsen will order renal ultrasound -Creatinine 1.22 on 10/22/2017 from 1.35 from 1.29 -Baseline creatinine 1.2-1.3  Sleep disorder Ambien nightly  DM 2, well controlled -A1c 6.4  Previous A1c 6.7 -Continue ISS -Heart healthy diabetic diet- -on metformin at home  Depression/anxiety -Continue Prozac -Xanax 0.25 twice daily as needed  Hypertension Blood pressure stable Continue amlodipine   Code Status: Full  Family Communication: Normal bedside  Disposition Plan: Home possibly tomorrow 10/23/2017 or when cleared by cardiology and passes home O2 evaluation  Consultants: Cardiology  Procedures:  None  Antimicrobials:  P.o. Levaquin  DVT prophylaxis: SCDs   Objective: Vitals:   10/22/17 0427 10/22/17 0720 10/22/17 0915 10/22/17 1126  BP: (!) 152/119 (!) 174/93  (!) 169/78  Pulse:  85  79  Resp:  18  17  Temp:  98.2 F (36.8 C)  98.3 F (36.8 C)  TempSrc:  Oral  Oral  SpO2:  94% 94%   Weight:      Height:        Intake/Output Summary (Last 24 hours) at 10/22/2017 1156 Last data filed at 10/22/2017 0723 Gross per 24 hour  Intake 730 ml  Output 4800 ml  Net -4070 ml   Filed Weights   10/20/17 1809 10/21/17 0111 10/22/17 0106  Weight: 81.6 kg (179 lb 14.3 oz) 80.7 kg (177 lb 14.6 oz) 80.2 kg (176 lb 12.9 oz)    Exam: 10/22/2017.  Patient seen and examined.  Physical exam is essentially unchanged  except for what is mentioned below.   General: 62 year old African-American female well-developed well-nourished no acute distress.   Cardiovascular: Regular rate and rhythm with no rubs or gallops  Respiratory: Mild diffuse rales bilaterally.  Abdomen: Soft nontender nondistended normal bowel sounds x4  Musculoskeletal: No focal abnormalities. No LE edema.  Skin: No rash  Psychiatry: Mood is appropriate for condition and setting   Data Reviewed: CBC: Recent Labs  Lab 10/19/17 2016  WBC  13.2*  HGB 8.8*  HCT 28.4*  MCV 68.4*  PLT 353   Basic Metabolic Panel: Recent Labs  Lab 10/16/17 0954 10/19/17 2016 10/19/17 2352 10/20/17 0444 10/21/17 0424 10/22/17 0337  NA 146* 141  --  142 139 141  K 3.2* 3.0*  --  3.3* 3.2* 3.2*  CL 104 104  --  105 102 103  CO2 25 24  --  24 26 29   GLUCOSE 100* 174*  --  128* 280* 137*  BUN 17 7  --  8 17 24*  CREATININE 1.35* 1.24*  --  1.29* 1.35* 1.22*  CALCIUM 8.8 8.5*  --  8.4* 8.8* 8.5*  MG  --   --  1.5*  --   --   --    GFR: Estimated Creatinine Clearance: 49.6 mL/min (A) (by C-G formula based on SCr of 1.22 mg/dL (H)). Liver Function Tests: No results for input(s): AST, ALT, ALKPHOS, BILITOT, PROT, ALBUMIN in the last 168 hours. No results for input(s): LIPASE, AMYLASE in the last 168 hours. No results for input(s): AMMONIA in the last 168 hours. Coagulation Profile: No results for input(s): INR, PROTIME in the last 168 hours. Cardiac Enzymes: Recent Labs  Lab 10/19/17 2352 10/20/17 0444 10/20/17 1854  TROPONINI <0.03 <0.03 <0.03   BNP (last 3 results) No results for input(s): PROBNP in the last 8760 hours. HbA1C: Recent Labs    10/20/17 0444  HGBA1C 6.4*   CBG: Recent Labs  Lab 10/21/17 0744 10/21/17 1116 10/21/17 1622 10/21/17 2116 10/22/17 0713  GLUCAP 212* 310* 192* 318* 87   Lipid Profile: Recent Labs    10/20/17 0444  CHOL 130  HDL 34*  LDLCALC 81  TRIG 74  CHOLHDL 3.8   Thyroid Function Tests: No results for input(s): TSH, T4TOTAL, FREET4, T3FREE, THYROIDAB in the last 72 hours. Anemia Panel: No results for input(s): VITAMINB12, FOLATE, FERRITIN, TIBC, IRON, RETICCTPCT in the last 72 hours. Urine analysis:    Component Value Date/Time   COLORURINE YELLOW 08/30/2017 0159   APPEARANCEUR CLEAR 08/30/2017 0159   LABSPEC 1.010 08/30/2017 0159   PHURINE 5.0 08/30/2017 0159   GLUCOSEU 50 (A) 08/30/2017 0159   HGBUR NEGATIVE 08/30/2017 0159   BILIRUBINUR NEGATIVE 08/30/2017 0159    KETONESUR NEGATIVE 08/30/2017 0159   PROTEINUR 100 (A) 08/30/2017 0159   NITRITE NEGATIVE 08/30/2017 0159   LEUKOCYTESUR NEGATIVE 08/30/2017 0159   Sepsis Labs: @LABRCNTIP (procalcitonin:4,lacticidven:4)  ) Recent Results (from the past 240 hour(s))  Culture, blood (routine x 2) Call MD if unable to obtain prior to antibiotics being given     Status: None (Preliminary result)   Collection Time: 10/19/17 11:45 PM  Result Value Ref Range Status   Specimen Description BLOOD RIGHT ARM  Final   Special Requests   Final    BOTTLES DRAWN AEROBIC AND ANAEROBIC Blood Culture adequate volume   Culture   Final    NO GROWTH 2 DAYS Performed at [facility], [place] [address] [place], [place] [address]    Report Status PENDING  Incomplete  Culture, blood (routine x 2) Call MD if unable to obtain prior to antibiotics being given     Status: None (Preliminary result)   Collection Time: 10/20/17 12:01 AM  Result Value Ref Range Status   Specimen Description BLOOD LEFT HAND  Final   Special Requests IN PEDIATRIC BOTTLE Blood Culture adequate volume  Final   Culture   Final    NO GROWTH 2 DAYS Performed at [facility], [place] [address], [place], [place] [address]    Report Status PENDING  Incomplete  Culture, respiratory (NON-Expectorated)     Status: None   Collection Time: 10/20/17 12:31 AM  Result Value Ref Range Status   Specimen Description SPUTUM  Final   Special Requests NONE  Final   Gram Stain   Final    FEW WBC PRESENT,BOTH PMN AND MONONUCLEAR MODERATE SQUAMOUS EPITHELIAL CELLS PRESENT ABUNDANT GRAM POSITIVE COCCI IN CHAINS IN CLUSTERS FEW GRAM NEGATIVE COCCI IN PAIRS RARE GRAM POSITIVE RODS    Culture   Final    Consistent with normal respiratory flora. Performed at [facility], [place] [address], [place], [place] [address]    Report Status 10/22/2017 FINAL  Final  MRSA PCR Screening     Status: None   Collection Time: 10/20/17  6:13 PM  Result Value Ref Range  Status   MRSA by PCR NEGATIVE NEGATIVE Final    Comment:        The GeneXpert MRSA Assay (FDA approved for NASAL specimens only), is one component of a comprehensive MRSA colonization surveillance program. It is not intended to diagnose MRSA infection nor to guide or monitor treatment for MRSA infections. Performed at [facility], [place] [address], [place], [place] [address]       Studies: Dg Chest Port 1 View  Result Date: 10/22/2017 CLINICAL DATA:  Hypoxia.  EXAM: PORTABLE CHEST 1 VIEW COMPARISON:  10/19/2017 FINDINGS: The heart is enlarged but stable. Persistent pulmonary edema but improved since prior study. No pleural effusions. IMPRESSION: Stable cardiac enlargement. Persistent but improved pulmonary edema. Electronically Signed   By: Marijo Sanes M.D.   On: 10/22/2017 09:24    Scheduled Meds: . amLODipine  10 mg Oral Daily  . aspirin EC  81 mg Oral Daily  . atorvastatin  40 mg Oral QHS  . carvedilol  6.25 mg Oral BID WC  . doxycycline  100 mg Oral Q12H  . enoxaparin (LOVENOX) injection  40 mg Subcutaneous Q24H  . FLUoxetine  20 mg Oral Daily  . [START ON 10/23/2017] furosemide  40 mg Oral Daily  . gabapentin  300 mg Oral TID  . hydrALAZINE  100 mg Oral TID  . insulin aspart  0-9 Units Subcutaneous TID WC  . insulin aspart  4 Units Subcutaneous TID WC  . insulin glargine  14 Units Subcutaneous BID  . ipratropium-albuterol  3 mL Nebulization TID  . losartan  100 mg Oral Daily  . montelukast  10 mg Oral QHS  . potassium chloride  40 mEq Oral BID  . predniSONE  20 mg Oral Q breakfast  . sodium chloride flush  3 mL Intravenous Q12H  . sodium chloride HYPERTONIC  4 mL Nebulization TID  . spironolactone  25 mg Oral Daily    Continuous Infusions: . sodium chloride       LOS: 3 days     Kayleen Memos, MD [facility] Pager [phone]  If 7PM-7AM, please contact night-coverage [email or web address] Password [facility] 10/22/2017, 11:56 AM

## 2017-10-22 NOTE — Progress Notes (Signed)
Pts BP remains very elevated (see vitals flowsheet) Do not have any scheduled meds to give at this time. Messaged attending MD re :"Can we get PRN ordered?"

## 2017-10-23 ENCOUNTER — Telehealth: Payer: Self-pay | Admitting: Interventional Cardiology

## 2017-10-23 LAB — BASIC METABOLIC PANEL
ANION GAP: 10 (ref 5–15)
BUN: 33 mg/dL — ABNORMAL HIGH (ref 6–20)
CO2: 28 mmol/L (ref 22–32)
Calcium: 8.7 mg/dL — ABNORMAL LOW (ref 8.9–10.3)
Chloride: 102 mmol/L (ref 101–111)
Creatinine, Ser: 1.39 mg/dL — ABNORMAL HIGH (ref 0.44–1.00)
GFR, EST AFRICAN AMERICAN: 46 mL/min — AB (ref >60)
GFR, EST NON AFRICAN AMERICAN: 40 mL/min — AB (ref >60)
Glucose, Bld: 122 mg/dL — ABNORMAL HIGH (ref 65–99)
Potassium: 3.8 mmol/L (ref 3.5–5.1)
Sodium: 140 mmol/L (ref 135–145)

## 2017-10-23 LAB — GLUCOSE, CAPILLARY
GLUCOSE-CAPILLARY: 138 mg/dL — AB (ref 65–99)
Glucose-Capillary: 145 mg/dL — ABNORMAL HIGH (ref 65–99)

## 2017-10-23 MED ORDER — CARVEDILOL 25 MG PO TABS: 25.0000 mg | ORAL_TABLET | Freq: Two times a day (BID) | ORAL | 0 refills | Status: DC

## 2017-10-23 MED ORDER — FUROSEMIDE 40 MG PO TABS: 40.0000 mg | ORAL_TABLET | Freq: Two times a day (BID) | ORAL | 0 refills | Status: DC

## 2017-10-23 MED ORDER — INSULIN ASPART 100 UNIT/ML ~~LOC~~ SOLN: 4.0000 [IU] | Freq: Three times a day (TID) | SUBCUTANEOUS | 0 refills | Status: DC

## 2017-10-23 MED ORDER — FUROSEMIDE 10 MG/ML IJ SOLN
40.0000 mg | Freq: Once | INTRAMUSCULAR | Status: AC
  Administered 2017-10-23: 40 mg via INTRAVENOUS
  Filled 2017-10-23: qty 4

## 2017-10-23 MED ORDER — SPIRONOLACTONE 25 MG PO TABS: 25.0000 mg | ORAL_TABLET | Freq: Every day | ORAL | 0 refills | Status: DC

## 2017-10-23 MED ORDER — ALBUTEROL SULFATE (2.5 MG/3ML) 0.083% IN NEBU: 2.5000 mg | INHALATION_SOLUTION | RESPIRATORY_TRACT | Status: DC | PRN

## 2017-10-23 MED FILL — SPIRONOLACTONE 25 MG TABS: 25 | 30 days supply | Qty: 30 | Fill #0

## 2017-10-23 MED FILL — CARVEDILOL 25 MG TABS: 25 | 30 days supply | Qty: 60 | Fill #0

## 2017-10-23 MED FILL — FUROSEMIDE 40 MG TAB: 40 | 30 days supply | Qty: 60 | Fill #0

## 2017-10-23 MED FILL — NovoLOG 100 UNIT/ML SOLN: 100 | 28 days supply | Qty: 10 | Fill #0

## 2017-10-23 NOTE — Telephone Encounter (Signed)
Patient would like to switch from Dr. Tamala Julian to Dr. Sallyanne Kuster. Would the switch be okay?

## 2017-10-23 NOTE — Discharge Summary (Signed)
Discharge Summary  Brenda Sims PFX:[id number] DOB: 1956/01/01  PCP: Tresa Garter, MD  Admit date: 10/19/2017 Discharge date: 10/23/2017  Time spent: 25 minutes  Recommendations for Outpatient Follow-up:  1.  Follow-up with PCP 2. Take your medication as prescribed 3. Salt restriction  Discharge Diagnoses:  Active Hospital Problems   Diagnosis Date Noted  . Acute respiratory failure with hypoxia ([place])   . HLD (hyperlipidemia) 10/19/2017  . Depression 10/19/2017  . Chest pain 10/19/2017  . CKD (chronic kidney disease) stage 3, GFR 30-59 ml/min (HCC) 10/07/2017  . Normocytic normochromic anemia 10/07/2017  . Hypokalemia 10/07/2017  . Bronchitis 10/07/2017  . Type II diabetes mellitus with renal manifestations ([place])   . Essential hypertension 08/29/2017  . Acute on chronic combined systolic and diastolic CHF (congestive heart failure) ([place]) 08/29/2017    Resolved Hospital Problems  No resolved problems to display.    Discharge Condition: Stable  Diet recommendation: Heart healthy diet with salt restriction  Vitals:   10/23/17 0951 10/23/17 1158  BP: (!) 151/85 (!) 157/87  Pulse:  79  Resp:  19  Temp:  97.8 F (36.6 C)  SpO2:  95%    History of present illness:  Brenda Rufusis a 62 y.o.femalewith medical history significant ofhypertension, hyperlipidemia, diabetes mellitus, depression, ICD, CKD-III, seizure, CHF with EF 45%, recent cardiac arrest, who presents with cough, chest pain,SOB, fever and chills.  10/20/2017: Patient seen and examined at her bedside in the ED she is on BiPAP with FiO2 40% and saturating mid 90's. Denies any chest pain, palpitations or dyspnea at rest.  10/21/2017: Patient seen and examined at her bedside.  States she wore her CPAP last night.  She is lying in the bed without any oxygen by nasal cannula.  With O2 saturation dropping in the low 80s.  Put back on O2 supplementation 3 L.  With improvement to 90-93%.  Patient denies chest  pain dyspnea at rest or palpitation.  RN reports nonsustained V. tach 4 beats and 6 beats this morning, asymptomatic.  Cardiology following and made aware by RN.  10/22/2017: Patient seen and examined.  She denies dyspnea at rest chest pain or palpitations.  Home O2 evaluation done.  Patient will require 2 L at discharge unless she passes repeat home O2 evaluation tomorrow.  Chest x-ray as reviewed by myself showed improvement in her pulmonary edema.  However may need to continue to be diuresis also needs better blood pressure control.  Defer to cardiology.   10/23/2017: Patient seen and examined at her bedside.  She has no new complaints.  She is breathing well on ambient air.  She passed her home O2 evaluation.  Patient advised to keep a low-sodium diet.  And to follow-up with her primary care provider and cardiologist.  On the day of discharge patient was hemodynamically stable.  She will need to keep her follow-up appointments.   Hospital Course:  Principal Problem:   Acute respiratory failure with hypoxia (HCC) Active Problems:   Acute on chronic combined systolic and diastolic CHF (congestive heart failure) (HCC)   Essential hypertension   Type II diabetes mellitus with renal manifestations (HCC)   Bronchitis   CKD (chronic kidney disease) stage 3, GFR 30-59 ml/min (HCC)   Hypokalemia   Normocytic normochromic anemia   HLD (hyperlipidemia)   Depression   Chest pain  Acute hypoxic respiratory failure secondary to acute exacerbation of chronic diastolic heart failure and acute COPD exacerbation, improving -Lasix dose increased from 20 mg daily to  40 mg daily -Monitor I's and O's, daily weights -Limit sodium intake and continue fluid restriction -BNP elevated on admission -Cardiology consulted and followed -last echo done 10/20/17 revealed LVEF 50% with grade 2 diastolic dysfunction -Passed home O2 evaluation test on 10/23/2017 -with no requirement for O2 supplementation.   -Repeat  chest x-ray revealed improvement in an increased pulmonary vascularity  Acute on chronic combined diastolic systolic heart failure with EF 50% -Continue cardiac meds -Patient on Coreg, Lasix, losartan, spironolactone  Hypertension, now patient will be referred to pulmonary as by her primary care provider patient understands and agrees to plan.  Controlled -On amlodipine, Coreg, losartan, Lasix, spironolactone, hydralazine 100 mg tid  Acute pulmonary edema most likely cardiogenic from acute on chronic systolic heart failure exacerbation, improving -Management as stated above -Continue cardiac medications -Continue diuresis  Self-reported history of OSA -States she left her CPAP in [place] recently moved in the area 6 months ago -Pulmonology outpatient follow-up after discharge -Patient will be referred to pulmonology by her primary care provider.  Patient understands and agrees to plan.   Acute COPD exacerbation secondary to acute heart failure exacerbation -Completed 5 days of antibiotics last on Levaquin,  -Weaned off IV Solu-Medrol   Hypomagnesemia/hypokalemia, resolved  -Most likely secondary to diuretics -Potassium 3.8 on 10/23/2017 from 3.2 -Repleted with potassium chloride supplement and magnesium supplement  CKD 3 -Avoid nephrotoxic agents/hypotension -Monitor urine output -Baseline creatinine 1.2-1.3 -Creatinine 10/23/2017 1.3 -Follow-up with PCP outpatient  Sleep disorder Sleep hygiene recommended  DM 2, well controlled -A1c 6.4  Previous A1c 6.7 -Continue ISS -Continue home meds  Depression/anxiety -Continue Prozac    Procedures:  None  Consultations:  Cardiology  Discharge Exam: BP (!) 157/87 (BP Location: Left Arm)   Pulse 79   Temp 97.8 F (36.6 C) (Oral)   Resp 19   Ht '5\' 4"'  (1.626 m)   Wt 80.2 kg (176 lb 12.9 oz)   SpO2 95%   BMI 30.35 kg/m   General: 62 year old African-American female well-developed well-nourished sitting  up in bed in no acute distress alert and oriented x3 Cardiovascular: Regular rate and rhythm no rubs or gallops Respiratory: Clear to auscultation with no wheezes or rales  Discharge Instructions You were cared for by a hospitalist during your hospital stay. If you have any questions about your discharge medications or the care you received while you were in the hospital after you are discharged, you can call the unit and asked to speak with the hospitalist on call if the hospitalist that took care of you is not available. Once you are discharged, your primary care physician will handle any further medical issues. Please note that NO REFILLS for any discharge medications will be authorized once you are discharged, as it is imperative that you return to your primary care physician (or establish a relationship with a primary care physician if you do not have one) for your aftercare needs so that they can reassess your need for medications and monitor your lab values.   Allergies as of 10/23/2017      Reactions   Eggs Or Egg-derived Products Shortness Of Breath, Rash   Azithromycin Swelling   Facial/throat swelling   Cherry Swelling   Facial swelling      Medication List    STOP taking these medications   traMADol 50 MG tablet Commonly known as:  ULTRAM     TAKE these medications   acetaminophen 500 MG tablet Commonly known as:  TYLENOL Take 1,000 mg by  mouth every 6 (six) hours as needed for headache (pain).   amLODipine 10 MG tablet Commonly known as:  NORVASC Take 1 tablet (10 mg total) by mouth daily.   aspirin EC 81 MG tablet Take 1 tablet (81 mg total) by mouth daily.   atorvastatin 40 MG tablet Commonly known as:  LIPITOR Take 1 tablet (40 mg total) by mouth at bedtime.   carvedilol 25 MG tablet Commonly known as:  COREG Take 1 tablet (25 mg total) by mouth 2 (two) times daily with a meal. What changed:    medication strength  how much to take   FLUoxetine 20 MG  capsule Commonly known as:  PROZAC Take 1 capsule (20 mg total) by mouth daily.   furosemide 40 MG tablet Commonly known as:  LASIX Take 1 tablet (40 mg total) by mouth 2 (two) times daily. What changed:    medication strength  how much to take  when to take this   gabapentin 300 MG capsule Commonly known as:  NEURONTIN Take 1 capsule (300 mg total) by mouth 3 (three) times daily.   glucose blood test strip Commonly known as:  TRUE METRIX BLOOD GLUCOSE TEST Use as instructed   glucose blood test strip Commonly known as:  ACCU-CHEK AVIVA Use as instructed   guaiFENesin-dextromethorphan 100-10 MG/5ML syrup Commonly known as:  ROBITUSSIN DM Take 5 mLs by mouth every 4 (four) hours as needed for cough.   hydrALAZINE 100 MG tablet Commonly known as:  APRESOLINE Take 1 tablet (100 mg total) by mouth 3 (three) times daily.   insulin aspart 100 UNIT/ML injection Commonly known as:  novoLOG Inject 4 Units into the skin 3 (three) times daily with meals.   insulin glargine 100 unit/mL Sopn Commonly known as:  LANTUS Inject 20 Units into the skin 2 (two) times daily.   losartan 100 MG tablet Commonly known as:  COZAAR Take 1 tablet (100 mg total) by mouth daily.   metFORMIN 1000 MG tablet Commonly known as:  GLUCOPHAGE Take 1 tablet (1,000 mg total) by mouth 2 (two) times daily with a meal.   montelukast 10 MG tablet Commonly known as:  SINGULAIR Take 1 tablet (10 mg total) by mouth at bedtime.   potassium chloride SA 20 MEQ tablet Commonly known as:  K-DUR,KLOR-CON Take 1 tablet (20 mEq total) by mouth daily.   spironolactone 25 MG tablet Commonly known as:  ALDACTONE Take 1 tablet (25 mg total) by mouth daily. Start taking on:  10/24/2017   TRUE METRIX METER Devi 1 kit by Does not apply route 4 (four) times daily.   ACCU-CHEK AVIVA device Use as instructed   TRUEPLUS LANCETS 28G Misc 28 g by Does not apply route 4 (four) times daily.   ACCU-CHEK SOFTCLIX  LANCETS lancets Use as instructed            Durable Medical Equipment  (From admission, onward)        Start     Ordered   10/23/17 1234  Heart failure home health orders  (Heart failure home health orders / Face to face)  Once    Comments:  Heart Failure Follow-up Care:  Verify follow-up appointments per Patient Discharge Instructions. Confirm transportation arranged. Reconcile home medications with discharge medication list. Remove discontinued medications from use. Assist patient/caregiver to manage medications using pill box. Reinforce low sodium food selection Assessments: Vital signs and oxygen saturation at each visit. Assess home environment for safety concerns, caregiver support and availability of  low-sodium foods. Consult Education officer, museum, PT/OT, Dietitian, and CNA based on assessments. Perform comprehensive cardiopulmonary assessment. Notify MD for any change in condition or weight gain of 3 pounds in one day or 5 pounds in one week with symptoms. Daily Weights and Symptom Monitoring: Ensure patient has access to scales. Teach patient/caregiver to weigh daily before breakfast and after voiding using same scale and record.    Teach patient/caregiver to track weight and symptoms and when to notify Provider. Activity: Develop individualized activity plan with patient/caregiver.   Question Answer Comment  Heart Failure Follow-up Care [facility] ([facility]) [facility] at [phone]   Obtain the following labs Basic Metabolic Panel   Lab frequency Weekly   Fax lab results to Other see comments   Diet Low Sodium Heart Healthy   Fluid restrictions: 1500 mL Fluid      10/23/17 1237     Allergies  Allergen Reactions  . Eggs Or Egg-Derived Products Shortness Of Breath and Rash  . Azithromycin Swelling    Facial/throat swelling  . Cherry Swelling    Facial swelling   Follow-up Information    [place]. Go on 11/01/2017.   Why:  at  10:00am for an appointment with Dr Margarita Rana in the [facility].  Contact information: [address] [place] [address] [phone]       Belva Crome, MD Follow up on 12/27/2017.   Specialty:  Cardiology Why:  Your three month follow up appointment will be on 12/27/17 at 1140am with Dr. Tamala Julian. Please arrive to your appointment at 1130am. Thank you  Contact information: [address] [place] [address] [phone]        Isaiah Serge, NP Follow up on 11/07/2017.   Specialties:  Cardiology, Radiology Why:  Your hospital follow up appointment is on 11/07/17 at 0930am with Cecilie Kicks, NP. Please arrive to your appointment at 0915am. Thank you  Contact information: [place] [address] [phone]            The results of significant diagnostics from this hospitalization (including imaging, microbiology, ancillary and laboratory) are listed below for reference.    Significant Diagnostic Studies: Dg Chest 2 View  Result Date: 10/19/2017 CLINICAL DATA:  Chest pain EXAM: CHEST - 2 VIEW COMPARISON:  CT 09/17/2017, 10/07/2017, 09/06/2017, 09/04/2017 radiographs FINDINGS: Increased diffuse bilateral ground-glass opacity. Small pleural effusions. Cardiomegaly with vascular congestion. Patchy confluent airspace disease at the bilateral lung bases. Aortic atherosclerosis. No pneumothorax. IMPRESSION: 1. Cardiomegaly with vascular congestion and diffuse increased ground-glass opacities suspect for pulmonary edema 2. Small pleural effusions. More confluent airspace disease at the bases may reflect superimposed atelectasis or pneumonia Electronically Signed   By: Donavan Foil M.D.   On: 10/19/2017 20:36   Dg Chest 2 View  Result Date: 10/07/2017 CLINICAL DATA:  Shortness of breath and cough EXAM: CHEST  2 VIEW COMPARISON:  09/06/2017 FINDINGS: Moderate cardiac enlargement. There is aortic  atherosclerosis. No pleural effusion or edema. No airspace opacities. Mild spondylosis noted within the thoracic spine. IMPRESSION: 1. No airspace consolidation identified. 2. Cardiac enlargement and Aortic Atherosclerosis (ICD10-I70.0). Electronically Signed   By: Kerby Moors M.D.   On: 10/07/2017 16:10   Ct Angio Chest Pe W And/or Wo Contrast  Result Date: 10/07/2017 CLINICAL DATA:  Shortness of breath EXAM: CT ANGIOGRAPHY CHEST WITH CONTRAST TECHNIQUE: Multidetector CT imaging of the chest was performed using the standard protocol during  bolus administration of intravenous contrast. Multiplanar CT image reconstructions and MIPs were obtained to evaluate the vascular anatomy. CONTRAST:  73 ml ISOVUE-370 IOPAMIDOL (ISOVUE-370) INJECTION 76% COMPARISON:  Radiograph 10/07/2017, CT chest 09/01/2016 FINDINGS: Cardiovascular: Satisfactory opacification of the pulmonary arteries to the segmental level. No evidence of pulmonary embolism. Nonaneurysmal aorta. Moderate-to-marked aortic atherosclerosis. Coronary artery calcification. Cardiomegaly. No large pericardial effusion Mediastinum/Nodes: Midline trachea. Hypodensity at the right lobe of thyroid thought artifactual given normal appearance on recent comparison. Surgical clips at the left lobe. Prominent right paratracheal lymph node measuring up to 15 mm. Lungs/Pleura: Decreased ground-glass density. Mild residual ground-glass opacity within the bilateral lungs. No pleural effusion. No pneumothorax. Upper Abdomen: No acute abnormality. Nodular fullness of the left adrenal gland. Musculoskeletal: No chest wall abnormality. No acute or significant osseous findings. Review of the MIP images confirms the above findings. IMPRESSION: 1. Negative for acute pulmonary embolus 2. Overall decreased ground-glass density within the lung fields. There is residual mild mosaic/ground-glass density throughout the lung suspected to represent residual pulmonary edema. There is  cardiomegaly. Aortic Atherosclerosis (ICD10-I70.0). Electronically Signed   By: Donavan Foil M.D.   On: 10/07/2017 19:28   Dg Chest Port 1 View  Result Date: 10/22/2017 CLINICAL DATA:  Hypoxia. EXAM: PORTABLE CHEST 1 VIEW COMPARISON:  10/19/2017 FINDINGS: The heart is enlarged but stable. Persistent pulmonary edema but improved since prior study. No pleural effusions. IMPRESSION: Stable cardiac enlargement. Persistent but improved pulmonary edema. Electronically Signed   By: Marijo Sanes M.D.   On: 10/22/2017 09:24    Microbiology: Recent Results (from the past 240 hour(s))  Culture, blood (routine x 2) Call MD if unable to obtain prior to antibiotics being given     Status: None (Preliminary result)   Collection Time: 10/19/17 11:45 PM  Result Value Ref Range Status   Specimen Description BLOOD RIGHT ARM  Final   Special Requests   Final    BOTTLES DRAWN AEROBIC AND ANAEROBIC Blood Culture adequate volume   Culture   Final    NO GROWTH 3 DAYS Performed at [facility], [address], [place], [place] [address]    Report Status PENDING  Incomplete  Culture, blood (routine x 2) Call MD if unable to obtain prior to antibiotics being given     Status: None (Preliminary result)   Collection Time: 10/20/17 12:01 AM  Result Value Ref Range Status   Specimen Description BLOOD LEFT HAND  Final   Special Requests IN PEDIATRIC BOTTLE Blood Culture adequate volume  Final   Culture   Final    NO GROWTH 3 DAYS Performed at [facility], [place] [address], [place], [place] [address]    Report Status PENDING  Incomplete  Culture, respiratory (NON-Expectorated)     Status: None   Collection Time: 10/20/17 12:31 AM  Result Value Ref Range Status   Specimen Description SPUTUM  Final   Special Requests NONE  Final   Gram Stain   Final    FEW WBC PRESENT,BOTH PMN AND MONONUCLEAR MODERATE SQUAMOUS EPITHELIAL CELLS PRESENT ABUNDANT GRAM POSITIVE COCCI IN CHAINS IN CLUSTERS FEW GRAM  NEGATIVE COCCI IN PAIRS RARE GRAM POSITIVE RODS    Culture   Final    Consistent with normal respiratory flora. Performed at [facility], [place] [address] [place],  [address]    Report Status 10/22/2017 FINAL  Final  MRSA PCR Screening     Status: None   Collection Time: 10/20/17  6:13 PM  Result Value Ref Range Status   MRSA by PCR NEGATIVE NEGATIVE Final    Comment:        The GeneXpert MRSA Assay (FDA approved for NASAL specimens only), is one component of a comprehensive MRSA colonization surveillance program. It is not intended to diagnose MRSA infection nor to guide or monitor treatment for MRSA infections. Performed at [facility], [place] [address], [place], [place] [address]      Labs: Basic Metabolic Panel: Recent Labs  Lab 10/19/17 2016 10/19/17 2352 10/20/17 0444 10/21/17 0424 10/22/17 0337 10/23/17 0345  NA 141  --  142 139 141 140  K 3.0*  --  3.3* 3.2* 3.2* 3.8  CL 104  --  105 102 103 102  CO2 24  --  '24 26 29 28  ' GLUCOSE 174*  --  128* 280* 137* 122*  BUN 7  --  8 17 24* 33*  CREATININE 1.24*  --  1.29* 1.35* 1.22* 1.39*  CALCIUM 8.5*  --  8.4* 8.8* 8.5* 8.7*  MG  --  1.5*  --   --   --   --    Liver Function Tests: No results for input(s): AST, ALT, ALKPHOS, BILITOT, PROT, ALBUMIN in the last 168 hours. No results for input(s): LIPASE, AMYLASE in the last 168 hours. No results for input(s): AMMONIA in the last 168 hours. CBC: Recent Labs  Lab 10/19/17 2016  WBC 13.2*  HGB 8.8*  HCT 28.4*  MCV 68.4*  PLT 330   Cardiac Enzymes: Recent Labs  Lab 10/19/17 2352 10/20/17 0444 10/20/17 1854  TROPONINI <0.03 <0.03 <0.03   BNP: BNP (last 3 results) Recent Labs    08/29/17 1530 10/07/17 2030 10/19/17 2016  BNP 1,016.4* 419.8* 1,539.6*    ProBNP (last 3 results) No results for input(s): PROBNP in the last 8760 hours.  CBG: Recent Labs  Lab 10/22/17 1125 10/22/17 1620 10/22/17 2150 10/23/17 0801  10/23/17 1154  GLUCAP 126* 229* 249* 145* 138*       Signed:  Kayleen Memos, MD [facility] 10/23/2017, 3:47 PM

## 2017-10-23 NOTE — Progress Notes (Signed)
Ambulated patient in hallway. Patient 100% on room air at rest. Patient ambulated on hallway on room air O2 sats 94%. Denies any resp distress at this time. Will continue to monitor.
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
## 2017-10-23 NOTE — Telephone Encounter (Signed)
No objection MCr

## 2017-10-23 NOTE — Progress Notes (Addendum)
Progress Note  Patient Name: Brenda Sims Date of Encounter: 10/23/2017  Primary Cardiologist: Dr. Daneen Schick  Subjective   Pt doing well this AM. CXR improved, however minimal weight decrease with diuresis. Lasix increased to BID. Carvedilol increased to 25 mg for greater BP control. Denies chest pain, no SOB, palpitations, or dizziness. Evaluation for home O2 in place.   Inpatient Medications    Scheduled Meds: . amLODipine  10 mg Oral Daily  . aspirin EC  81 mg Oral Daily  . atorvastatin  40 mg Oral QHS  . carvedilol  25 mg Oral BID WC  . doxycycline  100 mg Oral Q12H  . enoxaparin (LOVENOX) injection  40 mg Subcutaneous Q24H  . FLUoxetine  20 mg Oral Daily  . furosemide  40 mg Oral BID  . gabapentin  300 mg Oral TID  . hydrALAZINE  100 mg Oral TID  . insulin aspart  0-9 Units Subcutaneous TID WC  . insulin aspart  4 Units Subcutaneous TID WC  . insulin glargine  14 Units Subcutaneous BID  . ipratropium-albuterol  3 mL Nebulization TID  . losartan  100 mg Oral Daily  . montelukast  10 mg Oral QHS  . predniSONE  20 mg Oral Q breakfast  . senna  1 tablet Oral BID  . sodium chloride flush  3 mL Intravenous Q12H  . sodium chloride HYPERTONIC  4 mL Nebulization TID  . spironolactone  25 mg Oral Daily   Continuous Infusions: . sodium chloride     PRN Meds: sodium chloride, acetaminophen, albuterol, ALPRAZolam, guaiFENesin-dextromethorphan, hydrALAZINE, ipratropium, morphine injection, nitroGLYCERIN, ondansetron (ZOFRAN) IV, sodium chloride flush, traMADol, zolpidem   Vital Signs    Vitals:   10/23/17 0400 10/23/17 0807 10/23/17 0824 10/23/17 0951  BP: (!) 142/63 (!) 162/89  (!) 151/85  Pulse: 83 80    Resp: (!) 23 16    Temp: 97.9 F (36.6 C) 97.9 F (36.6 C)    TempSrc: Oral Axillary    SpO2: 93% 97% 96%   Weight:      Height:        Intake/Output Summary (Last 24 hours) at 10/23/2017 1132 Last data filed at 10/23/2017 1000 Gross per 24 hour  Intake 683  ml  Output 1450 ml  Net -767 ml   Filed Weights   10/21/17 0111 10/22/17 0106 10/23/17 0024  Weight: 177 lb 14.6 oz (80.7 kg) 176 lb 12.9 oz (80.2 kg) 176 lb 12.9 oz (80.2 kg)    Physical Exam   General: Well developed, well nourished, NAD Skin: Warm, dry, intact  Head: Normocephalic, atraumatic, clear, moist mucus membranes. Neck: Negative for carotid bruits. No JVD Lungs: Bilateral crackles in upper and lower lobes. No wheezes. Breathing is unlabored. Cardiovascular: RRR with S1 S2. No murmurs, rubs, or gallops Abdomen: Soft, non-tender, non-distended with normoactive bowel sounds. No obvious abdominal masses. MSK: Strength and tone appear normal for age. 5/5 in all extremities Extremities: No edema. No clubbing or cyanosis. DP/PT pulses 2+ bilaterally Neuro: Alert and oriented. No focal deficits. No facial asymmetry. MAE spontaneously. Psych: Responds to questions appropriately with normal affect.    Labs    Chemistry Recent Labs  Lab 10/21/17 0424 10/22/17 0337 10/23/17 0345  NA 139 141 140  K 3.2* 3.2* 3.8  CL 102 103 102  CO2 26 29 28   GLUCOSE 280* 137* 122*  BUN 17 24* 33*  CREATININE 1.35* 1.22* 1.39*  CALCIUM 8.8* 8.5* 8.7*  GFRNONAA 41* 47*  40*  GFRAA 48* 54* 46*  ANIONGAP 11 9 10      Hematology Recent Labs  Lab 10/19/17 2016  WBC 13.2*  RBC 4.15  HGB 8.8*  HCT 28.4*  MCV 68.4*  MCH 21.2*  MCHC 31.0  RDW 22.9*  PLT 330    Cardiac Enzymes Recent Labs  Lab 10/19/17 2352 10/20/17 0444 10/20/17 1854  TROPONINI <0.03 <0.03 <0.03    Recent Labs  Lab 10/19/17 2024  TROPIPOC 0.01     BNP Recent Labs  Lab 10/19/17 2016  BNP 1,539.6*     DDimer No results for input(s): DDIMER in the last 168 hours.   Radiology    Dg Chest Port 1 View  Result Date: 10/22/2017 CLINICAL DATA:  Hypoxia. EXAM: PORTABLE CHEST 1 VIEW COMPARISON:  10/19/2017 FINDINGS: The heart is enlarged but stable. Persistent pulmonary edema but improved since prior  study. No pleural effusions. IMPRESSION: Stable cardiac enlargement. Persistent but improved pulmonary edema. Electronically Signed   By: Marijo Sanes M.D.   On: 10/22/2017 09:24    Telemetry    10/23/17 NSR HR 81- Personally Reviewed  ECG    No new tracings as of 10/23/17 - Personally Reviewed  Cardiac Studies   Echo 10/20/17:  Study Conclusions - Left ventricle: The cavity size was mildly dilated. Wall thickness was increased in a pattern of mild LVH. Basal inferior akinesis. The estimated ejection fraction was 50%. Features are consistent with a pseudonormal left ventricular filling pattern, with concomitant abnormal relaxation and increased filling pressure (grade 2 diastolic dysfunction). - Aortic valve: There was no stenosis. - Mitral valve: Mildly calcified annulus. Mildly calcified leaflets . There was mild to moderate regurgitation. - Left atrium: The atrium was severely dilated. - Right ventricle: The cavity size was normal. Systolic function was normal. - Tricuspid valve: Peak RV-RA gradient (S): 25 mm Hg. - Pulmonary arteries: PA peak pressure: 33 mm Hg (S). - Systemic veins: IVC measured 2.4 cm with > 50% respirophasic variation, suggesting RA pressure 8 mmHg.  Impressions:  - Mildly dilated LV with mild LV hypertrophy. EF 50% with basal inferior akinesis. Moderate diastolic dysfunction. Normal RV size and systolic function. Severe left atrial enlargement. Mild to moderate MR.  Cath 09/11/2017 Conclusion     Ost Ramus to Ramus lesion is 70% stenosed.  Prox Cx to Mid Cx lesion is 65% stenosed.  Mid RCA to Dist RCA lesion is 50% stenosed.  LV end diastolic pressure is mildly elevated.  There is moderate left ventricular systolic dysfunction.  The left ventricular ejection fraction is 35-45% by visual estimate.  1. Moderate nonobstructive CAD 2. Moderate LV dysfunction. 3. Mildly elevated LVEDP   Patient Profile      62 y.o. female with a history of nonobstructive CAD, HTN, HL, DM2, CKD III, chronioc diasotlic HF, low normal to mildly decreased LVEF 45-50%. History of cardaic arrest Jan 2019 in setting of pneumonia and CHF with respiratory failure. Cath Jan 2019 shows 70% ostial ramus, 65% prox LCX treated medically.  Assessment & Plan    1.  Acute on chronic diastolic heart failure with mild respiratory failure: -Patient admitted with severe shortness of breath, initially placed on NRB/BiPAP with plans for diuresis -s/s of fluid volume overload -Echocardiogram 10/20/2017 with low normal LVEF at 50% and grade 2 DD -CXR 10/22/17 improved from previous imaging, stable cardiac enlargement with persistent nut improved pulmonary edema -Weight, 176 lb today>>>176lb 10/22/17 -I&O, net negative 4.1L since admission, total output 2.1L -Lasix increased to 40  mg BID in hopes for greater diuresis -Goal is for another 5-6 lb of diuresis -Continued to discuss diet and medication compliance, as well as strict follow up as OP to prevent further decline and repeat hospitalizations  2.  COPD exacerbation: -Per primary team -No history of tobacco use -Intermittent BiPAP, questionable home O2 need  3.  HTN: -Improved but elevated 151/85>162/89>142/63 -Aldactone 25 mg started 10/20/2017 given resistant HTN -Amlodipine 10 mg, carvedilol 25 mg, hydralazine 100 mg, losartan 100 mg -Carvedilol increased from 6.25mg  to 25 mg on 10/23/17, first dose today at 0800  4.  DM 2:  -Per primary team -SSI, HbA1c= 6.4  5.  HLD: -Stable, CHO-130, HDL-34, LDL-81,Trig-74 -Continue Lipitor   6.  OSA: -Per primary team -Evaluation for home O2 prior to discharge    Signed, Kathyrn Drown NP-C [facility] Pager: [phone] 10/23/2017, 11:32 AM       I have seen and examined the patient along with Kathyrn Drown NP-C.  I have reviewed the chart, notes and new data.  I agree with PA/NP's note.  Key new complaints: she is  preparing for DC. Not a lot of interim diuresis, but feels much better Key examination changes: BP better, albeit not perfect. No overt hypervolemia. Key new findings / data: creat 1.39  PLAN: If discharged today, has early f/u already scheduled 03/29. Asked her to weigh herself as soon as she gets home and then every day, call if she gains >2 lb overnight or at any point weighs >5lb from this discharge weight. Discussed sodium restriction in detail, including the salt that is already in prepared foods and certain beverages. She looked very surprised when I told her Gatorade is rich in salt. She had been using it to treat her cold and congestion.  Sanda Klein, MD, [place] [phone] 10/23/2017, 1:31 PM  For questions or updates, please contact   Please consult [email or web address] for contact info under Cardiology/STEMI.

## 2017-10-23 NOTE — Telephone Encounter (Signed)
No problem.

## 2017-10-23 NOTE — Progress Notes (Signed)
Patient being discharged home. Patient given discharge instructions. Patient educated on medications, follow up appts and weighing daily. Patient verbalized understanding. Patient discharged home with family.

## 2017-10-24 ENCOUNTER — Telehealth: Payer: Self-pay

## 2017-10-24 NOTE — Telephone Encounter (Signed)
[facility] Post-discharge Follow-Up Phone Call:  Date of Discharge: 10/23/2017 Principal Discharge Diagnosis(es): acute repsiratory failure, DM, CHF Post-discharge Communication: (Clearly document all attempts clearly and date contact made) call placed to the patient Call Completed: Yes                    With Whom:patient Interpreter Needed: No     Please check all that apply:  X Patient is knowledgeable of his/her condition(s) and/or treatment. ? Patient is caring for self at home.  X  Patient is receiving assist at home from family and/or caregiver. Family and/or caregiver is knowledgeable of patient's condition(s) and/or treatment. - she has support from her friend, Christia Reading and she is currently residing with timothy and his father/  She is in the process of looking for her own apartment and has started making phone calls to inquire about availability. She has not yet been to the [facility] [facility] ? Patient is receiving home health services. If so, name of agency. - not at this time.      Medication Reconciliation:  x Medication list reviewed with patient.  - the was reviewed at length with the patient. She had the discharge medication list to review. The insulin orders were explained in detail and she correctly repeated back each order. The use and directions for each medication on the AVS was explained and the changes in her medication regime were explained. X  Patient obtained all discharge medications. If not, why? - yes she has all medications. She was inquiring about xanax as it is not on the list. Instructed her to discuss with the provider at her next appointment.   She has a glucometer and stated that her blood sugar fasting this morning was 138.   Activities of Daily Living:  X Independent ? Needs assist (describe; ? home DME used) ? [facility] (describe, ? home DME used)   Community resources in place for patient:  X  None  - she is currently  trying to find an apartment. She did not want home health services ? Home Health/Home DME ? Assisted Living ? Support Group          Patient Education: Discussed the importance of checking her blood sugars as ordered.  She does not have a scale yet to monitor her weight. She did explain how she needs to monitor her sodium intake and foods that she is eating. She said she will have to put more effort into checking the foods that she purchases. She also noted that she is supposed to limit her fluid intake but will need reminders of the 1541ml /day limit She had no other questions at this time         Questions/Concerns discussed: she confirmed her appointment for 11/01/17 @ 1000 and stated that she has transportation to the clinic.

## 2017-10-25 LAB — CULTURE, BLOOD (ROUTINE X 2)
CULTURE: NO GROWTH
CULTURE: NO GROWTH
SPECIAL REQUESTS: ADEQUATE
Special Requests: ADEQUATE

## 2017-11-01 ENCOUNTER — Ambulatory Visit: Payer: Medicaid Other | Attending: Family Medicine | Admitting: Family Medicine

## 2017-11-01 ENCOUNTER — Encounter: Payer: Self-pay | Admitting: Family Medicine

## 2017-11-01 VITALS — BP 158/84 | HR 67 | Temp 98.0°F | Ht 64.0 in | Wt 172.2 lb

## 2017-11-01 DIAGNOSIS — E1122 Type 2 diabetes mellitus with diabetic chronic kidney disease: Secondary | ICD-10-CM | POA: Insufficient documentation

## 2017-11-01 DIAGNOSIS — J9600 Acute respiratory failure, unspecified whether with hypoxia or hypercapnia: Secondary | ICD-10-CM | POA: Insufficient documentation

## 2017-11-01 DIAGNOSIS — Z7982 Long term (current) use of aspirin: Secondary | ICD-10-CM | POA: Insufficient documentation

## 2017-11-01 DIAGNOSIS — F419 Anxiety disorder, unspecified: Secondary | ICD-10-CM | POA: Diagnosis not present

## 2017-11-01 DIAGNOSIS — N183 Chronic kidney disease, stage 3 unspecified: Secondary | ICD-10-CM

## 2017-11-01 DIAGNOSIS — R0982 Postnasal drip: Secondary | ICD-10-CM | POA: Diagnosis not present

## 2017-11-01 DIAGNOSIS — G4739 Other sleep apnea: Secondary | ICD-10-CM | POA: Diagnosis not present

## 2017-11-01 DIAGNOSIS — I10 Essential (primary) hypertension: Secondary | ICD-10-CM

## 2017-11-01 DIAGNOSIS — Z955 Presence of coronary angioplasty implant and graft: Secondary | ICD-10-CM | POA: Insufficient documentation

## 2017-11-01 DIAGNOSIS — I5042 Chronic combined systolic (congestive) and diastolic (congestive) heart failure: Secondary | ICD-10-CM

## 2017-11-01 DIAGNOSIS — F410 Panic disorder [episodic paroxysmal anxiety] without agoraphobia: Secondary | ICD-10-CM | POA: Diagnosis not present

## 2017-11-01 DIAGNOSIS — Z794 Long term (current) use of insulin: Secondary | ICD-10-CM | POA: Insufficient documentation

## 2017-11-01 DIAGNOSIS — Z9889 Other specified postprocedural states: Secondary | ICD-10-CM | POA: Diagnosis not present

## 2017-11-01 DIAGNOSIS — Z881 Allergy status to other antibiotic agents status: Secondary | ICD-10-CM | POA: Insufficient documentation

## 2017-11-01 DIAGNOSIS — Z96651 Presence of right artificial knee joint: Secondary | ICD-10-CM | POA: Diagnosis not present

## 2017-11-01 DIAGNOSIS — F329 Major depressive disorder, single episode, unspecified: Secondary | ICD-10-CM

## 2017-11-01 DIAGNOSIS — Z79899 Other long term (current) drug therapy: Secondary | ICD-10-CM | POA: Insufficient documentation

## 2017-11-01 DIAGNOSIS — Z91012 Allergy to eggs: Secondary | ICD-10-CM | POA: Diagnosis not present

## 2017-11-01 DIAGNOSIS — I130 Hypertensive heart and chronic kidney disease with heart failure and stage 1 through stage 4 chronic kidney disease, or unspecified chronic kidney disease: Secondary | ICD-10-CM | POA: Diagnosis not present

## 2017-11-01 DIAGNOSIS — Z91018 Allergy to other foods: Secondary | ICD-10-CM | POA: Diagnosis not present

## 2017-11-01 DIAGNOSIS — G4730 Sleep apnea, unspecified: Secondary | ICD-10-CM | POA: Insufficient documentation

## 2017-11-01 DIAGNOSIS — E876 Hypokalemia: Secondary | ICD-10-CM | POA: Insufficient documentation

## 2017-11-01 DIAGNOSIS — Z8674 Personal history of sudden cardiac arrest: Secondary | ICD-10-CM | POA: Insufficient documentation

## 2017-11-01 DIAGNOSIS — J810 Acute pulmonary edema: Secondary | ICD-10-CM | POA: Diagnosis not present

## 2017-11-01 DIAGNOSIS — F32A Depression, unspecified: Secondary | ICD-10-CM

## 2017-11-01 LAB — GLUCOSE, POCT (MANUAL RESULT ENTRY): POC GLUCOSE: 116 mg/dL — AB (ref 70–99)

## 2017-11-01 MED ORDER — CETIRIZINE HCL 10 MG PO TABS: 10.0000 mg | ORAL_TABLET | Freq: Every day | ORAL | 1 refills | Status: DC

## 2017-11-01 MED ORDER — BUSPIRONE HCL 5 MG PO TABS
5.0000 mg | ORAL_TABLET | Freq: Three times a day (TID) | ORAL | 3 refills | Status: DC
Start: 2017-11-01 — End: 2018-01-21

## 2017-11-01 MED ORDER — INSULIN GLARGINE 100 UNIT/ML SOLOSTAR PEN: 20.0000 [IU] | PEN_INJECTOR | Freq: Two times a day (BID) | SUBCUTANEOUS | 3 refills | Status: DC

## 2017-11-01 MED ORDER — SPIRONOLACTONE 25 MG PO TABS: 25.0000 mg | ORAL_TABLET | Freq: Every day | ORAL | 3 refills | Status: DC

## 2017-11-01 MED ORDER — CARVEDILOL 25 MG PO TABS: 25.0000 mg | ORAL_TABLET | Freq: Two times a day (BID) | ORAL | 0 refills | Status: DC

## 2017-11-01 MED ORDER — FUROSEMIDE 40 MG PO TABS: 40.0000 mg | ORAL_TABLET | Freq: Two times a day (BID) | ORAL | 3 refills | Status: DC

## 2017-11-01 MED FILL — LANTUS SOLOSTAR 100 UNITS/M: 100 | 30 days supply | Qty: 12 | Fill #0

## 2017-11-01 MED FILL — busPIRone HCL 5 MG TABS: 5 | 30 days supply | Qty: 90 | Fill #0

## 2017-11-01 MED FILL — CETIRIZINE HCL 10 MG TABS: 10 | 30 days supply | Qty: 30 | Fill #0

## 2017-11-01 NOTE — Progress Notes (Signed)
Subjective:  Patient ID: Brenda Sims, female    DOB: 1955-09-20  Age: 62 y.o. MRN: [id number]  CC: Hospitalization Follow-up   HPI Brenda Sims is a 62 year old female with a history of type 2 diabetes mellitus (A1c 6.4), chronic kidney disease, recent cardiac arrest, CHF (EF 50%), hypertension here for follow-up at the transitional care clinic after hospitalization for acute pulmonary edema at [facility] from 10/19/17 through 10/23/17.  She had presented to the ED with worsening shortness of breath, cough, chest pains, oxygen saturation of 80% on room air ,BNP of 1539 and hypokalemia.  She was admitted for acute respiratory failure secondary to CHF exacerbation and COPD exacerbation.  CXR revealed vascular congestion, diffuse increased groundglass opacities suspicious for pulmonary edema, cardiomegaly, small pleural effusions.  Commenced on BiPAP, IV diuretics, IV Solu-Medrol and nebulizer treatments, Levaquin for bronchitis.  Potassium, magnesium were also repleted.  Echocardiogram from 10/20/17: Study Conclusions  - Left ventricle: The cavity size was mildly dilated. Wall   thickness was increased in a pattern of mild LVH. Basal inferior   akinesis. The estimated ejection fraction was 50%. Features are   consistent with a pseudonormal left ventricular filling pattern,   with concomitant abnormal relaxation and increased filling   pressure (grade 2 diastolic dysfunction). - Aortic valve: There was no stenosis. - Mitral valve: Mildly calcified annulus. Mildly calcified leaflets   . There was mild to moderate regurgitation. - Left atrium: The atrium was severely dilated. - Right ventricle: The cavity size was normal. Systolic function   was normal. - Tricuspid valve: Peak RV-RA gradient (S): 25 mm Hg. - Pulmonary arteries: PA peak pressure: 33 mm Hg (S). - Systemic veins: IVC measured 2.4 cm with > 50% respirophasic   variation, suggesting RA pressure 8 mmHg.  Impressions:  -  Mildly dilated LV with mild LV hypertrophy. EF 50% with basal   inferior akinesis. Moderate diastolic dysfunction. Normal RV size   and systolic function. Severe left atrial enlargement. Mild to   moderate MR.  Condition improved and she was subsequently discharged.  She presents today denying shortness of breath or chest pains but has noticed a decreased appetite for the last 3 days.  Denies fever.  Complains of postnasal drip. She also reports a history of anxiety and panic attacks from previous trauma and was on Xanax while in [place].  Past Medical History:  Diagnosis Date  . CHF (congestive heart failure) ([place])   . CKD (chronic kidney disease)   . Diabetes mellitus without complication ([place])   . Hypertension     Past Surgical History:  Procedure Laterality Date  . APPENDECTOMY    . BACK SURGERY    . LEFT HEART CATH AND CORONARY ANGIOGRAPHY N/A 09/11/2017   Procedure: LEFT HEART CATH AND CORONARY ANGIOGRAPHY;  Surgeon: Martinique, Peter M, MD;  Location: [facility];  Service: Cardiovascular;  Laterality: N/A;  . TOTAL KNEE ARTHROPLASTY Right 2007    Allergies  Allergen Reactions  . Eggs Or Egg-Derived Products Shortness Of Breath and Rash  . Azithromycin Swelling    Facial/throat swelling  . Cherry Swelling    Facial swelling     Outpatient Medications Prior to Visit  Medication Sig Dispense Refill  . ACCU-CHEK SOFTCLIX LANCETS lancets Use as instructed 100 each 12  . acetaminophen (TYLENOL) 500 MG tablet Take 1,000 mg by mouth every 6 (six) hours as needed for headache (pain).    Marland Kitchen amLODipine (NORVASC) 10 MG tablet Take 1 tablet (10  mg total) by mouth daily. 30 tablet 3  . aspirin EC 81 MG tablet Take 1 tablet (81 mg total) by mouth daily. 150 tablet 0  . atorvastatin (LIPITOR) 40 MG tablet Take 1 tablet (40 mg total) by mouth at bedtime. 30 tablet 3  . Blood Glucose Monitoring Suppl (ACCU-CHEK AVIVA) device Use as instructed 1 each 0  . Blood Glucose Monitoring  Suppl (TRUE METRIX METER) DEVI 1 kit by Does not apply route 4 (four) times daily. 1 Device 0  . FLUoxetine (PROZAC) 20 MG capsule Take 1 capsule (20 mg total) by mouth daily. 30 capsule 3  . gabapentin (NEURONTIN) 300 MG capsule Take 1 capsule (300 mg total) by mouth 3 (three) times daily. 90 capsule 3  . glucose blood (ACCU-CHEK AVIVA) test strip Use as instructed 100 each 12  . glucose blood (TRUE METRIX BLOOD GLUCOSE TEST) test strip Use as instructed 120 each 12  . hydrALAZINE (APRESOLINE) 100 MG tablet Take 1 tablet (100 mg total) by mouth 3 (three) times daily. 90 tablet 3  . insulin aspart (NOVOLOG) 100 UNIT/ML injection Inject 4 Units into the skin 3 (three) times daily with meals. 10 mL 0  . losartan (COZAAR) 100 MG tablet Take 1 tablet (100 mg total) by mouth daily. 30 tablet 3  . metFORMIN (GLUCOPHAGE) 1000 MG tablet Take 1 tablet (1,000 mg total) by mouth 2 (two) times daily with a meal. 60 tablet 3  . montelukast (SINGULAIR) 10 MG tablet Take 1 tablet (10 mg total) by mouth at bedtime. 30 tablet 3  . potassium chloride SA (K-DUR,KLOR-CON) 20 MEQ tablet Take 1 tablet (20 mEq total) by mouth daily. 30 tablet 3  . TRUEPLUS LANCETS 28G MISC 28 g by Does not apply route 4 (four) times daily. 120 each 3  . carvedilol (COREG) 25 MG tablet Take 1 tablet (25 mg total) by mouth 2 (two) times daily with a meal. 60 tablet 0  . furosemide (LASIX) 40 MG tablet Take 1 tablet (40 mg total) by mouth 2 (two) times daily. 60 tablet 0  . insulin glargine (LANTUS) 100 unit/mL SOPN Inject 20 Units into the skin 2 (two) times daily.    Marland Kitchen spironolactone (ALDACTONE) 25 MG tablet Take 1 tablet (25 mg total) by mouth daily. 30 tablet 0  . guaiFENesin-dextromethorphan (ROBITUSSIN DM) 100-10 MG/5ML syrup Take 5 mLs by mouth every 4 (four) hours as needed for cough. (Patient not taking: Reported on 10/19/2017) 118 mL 0   No facility-administered medications prior to visit.     ROS Review of Systems    Constitutional: Positive for appetite change. Negative for activity change and fatigue.  HENT: Negative for congestion, sinus pressure and sore throat.   Eyes: Negative for visual disturbance.  Respiratory: Negative for cough, chest tightness, shortness of breath and wheezing.   Cardiovascular: Negative for chest pain and palpitations.  Gastrointestinal: Negative for abdominal distention, abdominal pain and constipation.  Endocrine: Negative for polydipsia.  Genitourinary: Negative for dysuria and frequency.  Musculoskeletal: Negative for arthralgias and back pain.  Skin: Negative for rash.  Neurological: Negative for tremors, light-headedness and numbness.  Hematological: Does not bruise/bleed easily.  Psychiatric/Behavioral: Negative for agitation and behavioral problems.    Objective:  BP (!) 158/84   Pulse 67   Temp 98 F (36.7 C) (Oral)   Ht '5\' 4"'$  (1.626 m)   Wt 172 lb 3.2 oz (78.1 kg)   SpO2 94%   BMI 29.56 kg/m   BP/Weight 11/01/2017 10/23/2017  08/18/1094  Systolic BP 045 409 811  Diastolic BP 84 87 85  Wt. (Lbs) 172.2 176.81 179  BMI 29.56 30.35 30.25     Physical Exam  Constitutional: She is oriented to person, place, and time. She appears well-developed and well-nourished.  Cardiovascular: Normal rate, normal heart sounds and intact distal pulses.  No murmur heard. Pulmonary/Chest: Effort normal and breath sounds normal. She has no wheezes. She has no rales. She exhibits no tenderness.  Abdominal: Soft. Bowel sounds are normal. She exhibits no distension and no mass. There is no tenderness.  Musculoskeletal: Normal range of motion.  Neurological: She is alert and oriented to person, place, and time.  Skin: Skin is warm and dry.  Psychiatric: She has a normal mood and affect.      Assessment & Plan:   1. Essential hypertension Slightly elevated No regimen change today low-sodium, lifestyle modifications; We will reassess at next visit and adjust   medications if indicated - carvedilol (COREG) 25 MG tablet; Take 1 tablet (25 mg total) by mouth 2 (two) times daily with a meal.  Dispense: 60 tablet; Refill: 0  2. CKD (chronic kidney disease) stage 3, GFR 30-59 ml/min (HCC) Last creatinine was 1.39 Stable avoid nephrotoxins   3. Chronic combined systolic and diastolic CHF (congestive heart failure) (HCC) EF 50% from echo of 10/2017 Euvolemic at this time  - spironolactone (ALDACTONE) 25 MG tablet; Take 1 tablet (25 mg total) by mouth daily.  Dispense: 30 tablet; Refill: 3  4. Acute pulmonary edema (HCC) Resolved - furosemide (LASIX) 40 MG tablet; Take 1 tablet (40 mg total) by mouth 2 (two) times daily.  Dispense: 60 tablet; Refill: 3 - spironolactone (ALDACTONE) 25 MG tablet; Take 1 tablet (25 mg total) by mouth daily.  Dispense: 30 tablet; Refill: 3  5. Other sleep apnea - Ambulatory referral to Sleep Studies  6. Post-nasal drip Commenced on Zyrtec - cetirizine (ZYRTEC) 10 MG tablet; Take 1 tablet (10 mg total) by mouth daily.  Dispense: 30 tablet; Refill: 1  7. Type 2 diabetes mellitus with stage 3 chronic kidney disease, with long-term current use of insulin (HCC) Continue Lantus to 20 units twice daily She is unsure if he has type I or type 2 diabetes mellitus-we will send of labs - POCT glucose (manual entry) - CMP14+EGFR - Insulin Glargine (LANTUS SOLOSTAR) 100 UNIT/ML Solostar Pen; Inject 20 Units into the skin 2 (two) times daily.  Dispense: 5 pen; Refill: 3 - Glutamic acid decarboxylase auto abs - Anti-islet cell antibody  8. Anxiety and depression States he took Xanax in the past We will commence BuSpar - busPIRone (BUSPAR) 5 MG tablet; Take 1 tablet (5 mg total) by mouth 3 (three) times daily.  Dispense: 90 tablet; Refill: 3   Meds ordered this encounter  Medications  . busPIRone (BUSPAR) 5 MG tablet    Sig: Take 1 tablet (5 mg total) by mouth 3 (three) times daily.    Dispense:  90 tablet    Refill:  3  .  Insulin Glargine (LANTUS SOLOSTAR) 100 UNIT/ML Solostar Pen    Sig: Inject 20 Units into the skin 2 (two) times daily.    Dispense:  5 pen    Refill:  3  . carvedilol (COREG) 25 MG tablet    Sig: Take 1 tablet (25 mg total) by mouth 2 (two) times daily with a meal.    Dispense:  60 tablet    Refill:  0  . furosemide (LASIX) 40 MG  tablet    Sig: Take 1 tablet (40 mg total) by mouth 2 (two) times daily.    Dispense:  60 tablet    Refill:  3  . spironolactone (ALDACTONE) 25 MG tablet    Sig: Take 1 tablet (25 mg total) by mouth daily.    Dispense:  30 tablet    Refill:  3  . cetirizine (ZYRTEC) 10 MG tablet    Sig: Take 1 tablet (10 mg total) by mouth daily.    Dispense:  30 tablet    Refill:  1    Follow-up: Return in about 1 month (around 12/02/2017) for Follow-up of chronic medical conditions.   Charlott Rakes MD

## 2017-11-01 NOTE — Patient Instructions (Signed)
Panic Attacks Panic attacks are sudden, short feelings of great fear or discomfort. You may have them for no reason when you are relaxed, when you are uneasy (anxious), or when you are sleeping. Follow these instructions at home:  Take all your medicines as told.  Check with your doctor before starting new medicines.  Keep all doctor visits. Contact a doctor if:  You are not able to take your medicines as told.  Your symptoms do not get better.  Your symptoms get worse. Get help right away if:  Your attacks seem different than your normal attacks.  You have thoughts about hurting yourself or others.  You take panic attack medicine and you have a side effect. This information is not intended to replace advice given to you by your health care provider. Make sure you discuss any questions you have with your health care provider. Document Released: 09/01/2010 Document Revised: 01/05/2016 Document Reviewed: 03/13/2013 Elsevier Interactive Patient Education  2017 [employer]vier Inc.  

## 2017-11-05 LAB — CMP14+EGFR
ALT: 15 IU/L (ref 0–32)
AST: 18 IU/L (ref 0–40)
Albumin/Globulin Ratio: 1.3 (ref 1.2–2.2)
Albumin: 3.8 g/dL (ref 3.6–4.8)
Alkaline Phosphatase: 94 IU/L (ref 39–117)
BUN/Creatinine Ratio: 12 (ref 12–28)
BUN: 17 mg/dL (ref 8–27)
Bilirubin Total: 0.2 mg/dL (ref 0.0–1.2)
CALCIUM: 9.3 mg/dL (ref 8.7–10.3)
CO2: 25 mmol/L (ref 20–29)
CREATININE: 1.42 mg/dL — AB (ref 0.57–1.00)
Chloride: 102 mmol/L (ref 96–106)
GFR, EST AFRICAN AMERICAN: 46 mL/min/{1.73_m2} — AB (ref >59)
GFR, EST NON AFRICAN AMERICAN: 40 mL/min/{1.73_m2} — AB (ref >59)
GLUCOSE: 99 mg/dL (ref 65–99)
Globulin, Total: 3 g/dL (ref 1.5–4.5)
POTASSIUM: 3.7 mmol/L (ref 3.5–5.2)
Sodium: 144 mmol/L (ref 134–144)
TOTAL PROTEIN: 6.8 g/dL (ref 6.0–8.5)

## 2017-11-05 LAB — GLUTAMIC ACID DECARBOXYLASE AUTO ABS: Glutamic Acid Decarb Ab: <5 U/mL (ref 0.0–5.0)

## 2017-11-05 LAB — ANTI-ISLET CELL ANTIBODY: Islet Cell Ab: NEGATIVE

## 2017-11-06 ENCOUNTER — Telehealth: Payer: Self-pay

## 2017-11-06 NOTE — Telephone Encounter (Signed)
Patient was called and informed to return phone call for lab results.   If patient returns phone call please inform patient that labs are stable.

## 2017-11-07 ENCOUNTER — Ambulatory Visit: Payer: Medicaid - Out of State | Admitting: Cardiology

## 2017-11-07 ENCOUNTER — Telehealth: Payer: Self-pay | Admitting: Family Medicine

## 2017-11-07 NOTE — Telephone Encounter (Signed)
Noted  

## 2017-11-07 NOTE — Telephone Encounter (Signed)
Patient was called and informed of lab results. 

## 2017-11-07 NOTE — Progress Notes (Deleted)
Cardiology Office Note   Date:  11/07/2017   ID:  Paulett Kaufhold, DOB 06-Jan-1956, MRN [id number]  PCP:  Charlott Rakes, MD  Cardiologist:  Dr. Sallyanne Kuster    No chief complaint on file.     History of Present Illness: Brenda Sims is a 62 y.o. female who presents for Hospital follow up for acute on chronic diastolic HF.  With mild resp failure.  Actually 3 hospitalizations.  ***   history of nonobstructive CAD, HTN, HL, DM2, CKD III, chronioc diasotlic HF, low normal to mildly decreased LVEF 45-50%, seizures and now history of cardaic arrest Jan 2019 in setting of pneumonia and CHF with respiratory failure. Cath Jan 2019 shows 70% ostial ramus, 65% prox LCX treated medically.  Intially on Bipap, along with COPD exacerbation, elevated BNP in ER.  Diuresis neg 4.1 , wt pk 179 and at d/c on 10/23/17 of 176 lbs.   Aldactone started 25 mg daily.  Other meds Amlodipine 10 mg, carvedilol 6.25 mg, hydralazine 100 mg, losartan 100 mg Also OSA with CPAP.      Past Medical History:  Diagnosis Date  . CHF (congestive heart failure) ([place])   . CKD (chronic kidney disease)   . Diabetes mellitus without complication ([place])   . Hypertension     Past Surgical History:  Procedure Laterality Date  . APPENDECTOMY    . BACK SURGERY    . LEFT HEART CATH AND CORONARY ANGIOGRAPHY N/A 09/11/2017   Procedure: LEFT HEART CATH AND CORONARY ANGIOGRAPHY;  Surgeon: Martinique, Peter M, MD;  Location: [facility];  Service: Cardiovascular;  Laterality: N/A;  . TOTAL KNEE ARTHROPLASTY Right 2007     Current Outpatient Medications  Medication Sig Dispense Refill  . ACCU-CHEK SOFTCLIX LANCETS lancets Use as instructed 100 each 12  . acetaminophen (TYLENOL) 500 MG tablet Take 1,000 mg by mouth every 6 (six) hours as needed for headache (pain).    Marland Kitchen amLODipine (NORVASC) 10 MG tablet Take 1 tablet (10 mg total) by mouth daily. 30 tablet 3  . aspirin EC 81 MG tablet Take 1 tablet (81 mg total) by mouth daily.  150 tablet 0  . atorvastatin (LIPITOR) 40 MG tablet Take 1 tablet (40 mg total) by mouth at bedtime. 30 tablet 3  . Blood Glucose Monitoring Suppl (ACCU-CHEK AVIVA) device Use as instructed 1 each 0  . Blood Glucose Monitoring Suppl (TRUE METRIX METER) DEVI 1 kit by Does not apply route 4 (four) times daily. 1 Device 0  . busPIRone (BUSPAR) 5 MG tablet Take 1 tablet (5 mg total) by mouth 3 (three) times daily. 90 tablet 3  . carvedilol (COREG) 25 MG tablet Take 1 tablet (25 mg total) by mouth 2 (two) times daily with a meal. 60 tablet 0  . cetirizine (ZYRTEC) 10 MG tablet Take 1 tablet (10 mg total) by mouth daily. 30 tablet 1  . FLUoxetine (PROZAC) 20 MG capsule Take 1 capsule (20 mg total) by mouth daily. 30 capsule 3  . furosemide (LASIX) 40 MG tablet Take 1 tablet (40 mg total) by mouth 2 (two) times daily. 60 tablet 3  . gabapentin (NEURONTIN) 300 MG capsule Take 1 capsule (300 mg total) by mouth 3 (three) times daily. 90 capsule 3  . glucose blood (ACCU-CHEK AVIVA) test strip Use as instructed 100 each 12  . glucose blood (TRUE METRIX BLOOD GLUCOSE TEST) test strip Use as instructed 120 each 12  . guaiFENesin-dextromethorphan (ROBITUSSIN DM) 100-10 MG/5ML syrup Take 5 mLs  by mouth every 4 (four) hours as needed for cough. (Patient not taking: Reported on 10/19/2017) 118 mL 0  . hydrALAZINE (APRESOLINE) 100 MG tablet Take 1 tablet (100 mg total) by mouth 3 (three) times daily. 90 tablet 3  . insulin aspart (NOVOLOG) 100 UNIT/ML injection Inject 4 Units into the skin 3 (three) times daily with meals. 10 mL 0  . Insulin Glargine (LANTUS SOLOSTAR) 100 UNIT/ML Solostar Pen Inject 20 Units into the skin 2 (two) times daily. 5 pen 3  . losartan (COZAAR) 100 MG tablet Take 1 tablet (100 mg total) by mouth daily. 30 tablet 3  . metFORMIN (GLUCOPHAGE) 1000 MG tablet Take 1 tablet (1,000 mg total) by mouth 2 (two) times daily with a meal. 60 tablet 3  . montelukast (SINGULAIR) 10 MG tablet Take 1 tablet  (10 mg total) by mouth at bedtime. 30 tablet 3  . potassium chloride SA (K-DUR,KLOR-CON) 20 MEQ tablet Take 1 tablet (20 mEq total) by mouth daily. 30 tablet 3  . spironolactone (ALDACTONE) 25 MG tablet Take 1 tablet (25 mg total) by mouth daily. 30 tablet 3  . TRUEPLUS LANCETS 28G MISC 28 g by Does not apply route 4 (four) times daily. 120 each 3   No current facility-administered medications for this visit.     Allergies:   Eggs or egg-derived products; Azithromycin; and Cherry    Social History:  The patient  reports that she has never smoked. She has never used smokeless tobacco. She reports that she does not drink alcohol or use drugs.   Family History:  The patient's ***family history includes Hypertension in her other.    ROS:  General:no colds or fevers, no weight changes Skin:no rashes or ulcers HEENT:no blurred vision, no congestion CV:see HPI PUL:see HPI GI:no diarrhea constipation or melena, no indigestion GU:no hematuria, no dysuria MS:no joint pain, no claudication Neuro:no syncope, no lightheadedness Endo:no diabetes, no thyroid disease Wt Readings from Last 3 Encounters:  11/01/17 172 lb 3.2 oz (78.1 kg)  10/23/17 176 lb 12.9 oz (80.2 kg)  10/16/17 179 lb (81.2 kg)     PHYSICAL EXAM: VS:  There were no vitals taken for this visit. , BMI There is no height or weight on file to calculate BMI. General:Pleasant affect, NAD Skin:Warm and dry, brisk capillary refill HEENT:normocephalic, sclera clear, mucus membranes moist Neck:supple, no JVD, no bruits  Heart:S1S2 RRR without murmur, gallup, rub or click Lungs:clear without rales, rhonchi, or wheezes NTZ:GYFV, non tender, + BS, do not palpate liver spleen or masses Ext:no lower ext edema, 2+ pedal pulses, 2+ radial pulses Neuro:alert and oriented, MAE, follows commands, + facial symmetry    EKG:  EKG is ordered today. The ekg ordered today demonstrates ***   Recent Labs: 09/03/2017: TSH 2.308 10/19/2017: B  Natriuretic Peptide 1,539.6; Hemoglobin 8.8; Magnesium 1.5; Platelets 330 11/01/2017: ALT 15; BUN 17; Creatinine, Ser 1.42; Potassium 3.7; Sodium 144    Lipid Panel    Component Value Date/Time   CHOL 130 10/20/2017 0444   TRIG 74 10/20/2017 0444   HDL 34 (L) 10/20/2017 0444   CHOLHDL 3.8 10/20/2017 0444   VLDL 15 10/20/2017 0444   LDLCALC 81 10/20/2017 0444       Other studies Reviewed: Additional studies/ records that were reviewed today include: ***. Echo 10/20/17:  Study Conclusions - Left ventricle: The cavity size was mildly dilated. Wall thickness was increased in a pattern of mild LVH. Basal inferior akinesis. The estimated ejection fraction was 50%. Features  are consistent with a pseudonormal left ventricular filling pattern, with concomitant abnormal relaxation and increased filling pressure (grade 2 diastolic dysfunction). - Aortic valve: There was no stenosis. - Mitral valve: Mildly calcified annulus. Mildly calcified leaflets . There was mild to moderate regurgitation. - Left atrium: The atrium was severely dilated. - Right ventricle: The cavity size was normal. Systolic function was normal. - Tricuspid valve: Peak RV-RA gradient (S): 25 mm Hg. - Pulmonary arteries: PA peak pressure: 33 mm Hg (S). - Systemic veins: IVC measured 2.4 cm with > 50% respirophasic variation, suggesting RA pressure 8 mmHg.  Impressions:  - Mildly dilated LV with mild LV hypertrophy. EF 50% with basal inferior akinesis. Moderate diastolic dysfunction. Normal RV size and systolic function. Severe left atrial enlargement. Mild to moderate MR.  Cath 09/11/2017 Conclusion     Ost Ramus to Ramus lesion is 70% stenosed.  Prox Cx to Mid Cx lesion is 65% stenosed.  Mid RCA to Dist RCA lesion is 50% stenosed.  LV end diastolic pressure is mildly elevated.  There is moderate left ventricular systolic dysfunction.  The left ventricular ejection  fraction is 35-45% by visual estimate.  1. Moderate nonobstructive CAD 2. Moderate LV dysfunction. 3. Mildly elevated LVEDP      ASSESSMENT AND PLAN:  1.  ***   Current medicines are reviewed with the patient today.  The patient Has no concerns regarding medicines.  The following changes have been made:  See above Labs/ tests ordered today include:see above  Disposition:   FU:  see above  Signed, Cecilie Kicks, NP  11/07/2017 9:14 AM    [address], [place], [place] Phone: [phone]; Fax: [phone]

## 2017-11-07 NOTE — Telephone Encounter (Signed)
Patient called and I informed her that her labs are stable.

## 2017-11-08 ENCOUNTER — Telehealth: Payer: Self-pay | Admitting: Family Medicine

## 2017-11-08 ENCOUNTER — Other Ambulatory Visit: Payer: Self-pay | Admitting: Pharmacist

## 2017-11-08 MED ORDER — INSULIN PEN NEEDLE 31G X 6 MM MISC: 2 refills | Status: DC

## 2017-11-08 MED FILL — TRUEPLUS PEN NDL 31G X 1/4: 31G X 6 MM | 30 days supply | Qty: 100 | Fill #0

## 2017-11-08 MED FILL — TRUEPLUS PEN NDL 31G X 1/4": 31G X 6 MM | 30 days supply | Qty: 100 | Fill #0

## 2017-11-08 NOTE — Telephone Encounter (Signed)
Call placed to patient [phone], to check on her status. Spoke with patient and she stated that she is doing "ok". Patient mentioned that she is taking her medications as indicated but has not been weighing herself since she doesn't have a scale. Family member hasn't gotten it for her.  Asked patient if she has felt any discomfort or pain and patient said "no". Towards the end of the conversation patient asked about her referral for her sleep study. Informed patient that the referral was placed to [facility] and they had tried to contact her but were only able to leave her a message. Patient understood. Informed patient that I will get the details to the sleep center from [facility], referral coordinator, when she returns next week. Patient understood and had no further question.

## 2017-11-12 MED FILL — CARVEDILOL 6.25 MG TABLET: 6.25 | 30 days supply | Qty: 60 | Fill #1

## 2017-11-12 MED FILL — AMLODIPINE BESYLATE 10 MG T: 10 | 30 days supply | Qty: 30 | Fill #1

## 2017-11-12 MED FILL — LOSARTAN POTASSIUM 100 MG T: 100 | 30 days supply | Qty: 30 | Fill #1

## 2017-11-12 MED FILL — ACCU-CHEK AVIVA PLUS TEST S: 25 days supply | Qty: 100 | Fill #1

## 2017-11-12 MED FILL — FUROSEMIDE 20 MG TABLET: 20 | 30 days supply | Qty: 30 | Fill #2

## 2017-11-12 MED FILL — FLUoxetine HCL 20 MG CAPS: 20 | 30 days supply | Qty: 30 | Fill #1

## 2017-11-12 MED FILL — hydrALAZINE HCL 100 MG TABS: 100 | 30 days supply | Qty: 90 | Fill #2

## 2017-11-12 MED FILL — GABAPENTIN 300 MG CAPSULE: 300 | 30 days supply | Qty: 90 | Fill #1

## 2017-11-12 MED FILL — metFORMIN HCL 1000 MG TABS: 1000 | 30 days supply | Qty: 60 | Fill #1

## 2017-11-12 MED FILL — POTASSIUM CL ER 20 MEQ TAB: 20 | 30 days supply | Qty: 30 | Fill #1

## 2017-11-12 MED FILL — traMADol HCL 50 MG TABS: 50 | 3 days supply | Qty: 9 | Fill #1

## 2017-11-12 MED FILL — ACCU-CHEK SOFTCLIX LANCETS: 25 days supply | Qty: 100 | Fill #1

## 2017-11-12 MED FILL — MONTELUKAST SOD 10 MG TAB: 10 | 30 days supply | Qty: 30 | Fill #1

## 2017-11-15 ENCOUNTER — Telehealth: Payer: Self-pay | Admitting: Family Medicine

## 2017-11-15 NOTE — Telephone Encounter (Signed)
Call placed to patient [phone], to check on her status. Spoke with patient and she stated that she is doing "ok". Patient has been taking her medications as indicated but has not been weighing herself since she still hasn't gotten a scale. Patient doesn't know when she will get it.   Gave patient the phone number to [facility] to schedule her sleep study [phone]. Patient stated that she will contact them. No further questions or concerns.

## 2017-11-21 NOTE — Progress Notes (Signed)
Cardiology Office Note:    Date:  11/22/2017   ID:  Brenda Sims, DOB 24-Dec-1955, MRN [id number]  PCP:  Brenda Rakes, MD  Cardiologist:  Brenda Klein, MD   Referring MD: Brenda Rakes, MD   Chief Complaint  Patient presents with  . Hospitalization Follow-up    CHF    History of Present Illness:    Brenda Sims is a 62 y.o. female with a hx of nonobstructive CAD, HTN, HLD, DM, CKD stage III, chronic diastolic heart failure, lowe normal LVEF 45-50%. She was admitted Jan 2019 s/p PEA/bradycardic arrest in the setting of PNA and CHF with respiratory failure. Heart cath at that time revealed nonobstructive disease.   He presented to [facility] 10/19/17 with several days of shortness of breath and dyspnea on exertion. He was admitted with acute on chronic diastolic heart failure and COPD exacerbation. He was diuresed and discharged on 10/23/17. Her discharge weight was 176 lbs. He was discharged on 40 mg PO lasix BID, coreg 25 mg BID, aldactone 25 mg daily, norvasc 10 mg daily, hydralazine 100 mg TID, and losartan 100 mg daily.  She returns for hospital follow up today. She is here with her fiance. She is doing well. She has been doing situps and pushups against a wall. We discussed starting a walking program. She continues to use 2 pillows at night to sleep for her neck and back comfort. She continues to have DOE when walking 1000 yards, but this is her baseline. We discussed walking as a way to increase her stamina. She weighs daily. Her pressures are still elevated.  She states the lipitor is causing body aches. She is weighing daily and taking her home pressures. Pressures remain elevated - she reports [id number] systolic, but then also reports a reading in the 200s (not recently).  She dropped a bottle of insulin and broke it. She is establishing with a PCP today (Dr. Olive Sims?). I asked her to follow up her insulin needs at that appt today.   Past Medical History:  Diagnosis Date  . CHF (congestive  heart failure) ([place])   . CKD (chronic kidney disease)   . Diabetes mellitus without complication ([place])   . Hypertension     Past Surgical History:  Procedure Laterality Date  . APPENDECTOMY    . BACK SURGERY    . LEFT HEART CATH AND CORONARY ANGIOGRAPHY N/A 09/11/2017   Procedure: LEFT HEART CATH AND CORONARY ANGIOGRAPHY;  Surgeon: Martinique, Peter M, MD;  Location: [facility];  Service: Cardiovascular;  Laterality: N/A;  . TOTAL KNEE ARTHROPLASTY Right 2007    Current Medications: Current Meds  Medication Sig  . ACCU-CHEK SOFTCLIX LANCETS lancets Use as instructed  . acetaminophen (TYLENOL) 500 MG tablet Take 1,000 mg by mouth every 6 (six) hours as needed for headache (pain).  Marland Kitchen amLODipine (NORVASC) 10 MG tablet Take 1 tablet (10 mg total) by mouth daily.  Marland Kitchen aspirin EC 81 MG tablet Take 1 tablet (81 mg total) by mouth daily.  . Blood Glucose Monitoring Suppl (ACCU-CHEK AVIVA) device Use as instructed  . Blood Glucose Monitoring Suppl (TRUE METRIX METER) DEVI 1 kit by Does not apply route 4 (four) times daily.  . busPIRone (BUSPAR) 5 MG tablet Take 1 tablet (5 mg total) by mouth 3 (three) times daily.  . carvedilol (COREG) 25 MG tablet Take 1 tablet (25 mg total) by mouth 2 (two) times daily with a meal.  . cetirizine (ZYRTEC) 10 MG tablet Take 1 tablet (10  mg total) by mouth daily.  Marland Kitchen FLUoxetine (PROZAC) 20 MG capsule Take 1 capsule (20 mg total) by mouth daily.  . furosemide (LASIX) 40 MG tablet Take 1 tablet (40 mg total) by mouth 2 (two) times daily.  Marland Kitchen gabapentin (NEURONTIN) 300 MG capsule Take 1 capsule (300 mg total) by mouth 3 (three) times daily.  Marland Kitchen glucose blood (ACCU-CHEK AVIVA) test strip Use as instructed  . glucose blood (TRUE METRIX BLOOD GLUCOSE TEST) test strip Use as instructed  . guaiFENesin-dextromethorphan (ROBITUSSIN DM) 100-10 MG/5ML syrup Take 5 mLs by mouth every 4 (four) hours as needed for cough.  . hydrALAZINE (APRESOLINE) 100 MG tablet Take 1 tablet  (100 mg total) by mouth 3 (three) times daily.  . insulin aspart (NOVOLOG) 100 UNIT/ML injection Inject 4 Units into the skin 3 (three) times daily with meals.  . Insulin Glargine (LANTUS SOLOSTAR) 100 UNIT/ML Solostar Pen Inject 20 Units into the skin 2 (two) times daily.  . Insulin Pen Needle (TRUEPLUS PEN NEEDLES) 31G X 6 MM MISC Use as directed  . losartan (COZAAR) 100 MG tablet Take 1 tablet (100 mg total) by mouth daily.  . metFORMIN (GLUCOPHAGE) 1000 MG tablet Take 1 tablet (1,000 mg total) by mouth 2 (two) times daily with a meal.  . montelukast (SINGULAIR) 10 MG tablet Take 1 tablet (10 mg total) by mouth at bedtime.  . potassium chloride SA (K-DUR,KLOR-CON) 20 MEQ tablet Take 1 tablet (20 mEq total) by mouth daily.  Marland Kitchen spironolactone (ALDACTONE) 50 MG tablet Take 1 tablet (50 mg total) by mouth daily.  . traMADol (ULTRAM) 50 MG tablet Take 50 mg by mouth every 8 (eight) hours as needed for pain.  . TRUEPLUS LANCETS 28G MISC 28 g by Does not apply route 4 (four) times daily.  . [DISCONTINUED] atorvastatin (LIPITOR) 40 MG tablet Take 1 tablet (40 mg total) by mouth at bedtime.  . [DISCONTINUED] carvedilol (COREG) 25 MG tablet Take 1 tablet (25 mg total) by mouth 2 (two) times daily with a meal.  . [DISCONTINUED] spironolactone (ALDACTONE) 25 MG tablet Take 1 tablet (25 mg total) by mouth daily.     Allergies:   Eggs or egg-derived products; Azithromycin; and Cherry   Social History   Socioeconomic History  . Marital status: Unknown    Spouse name: Not on file  . Number of children: Not on file  . Years of education: Not on file  . Highest education level: Not on file  Occupational History  . Not on file  Social Needs  . Financial resource strain: Not on file  . Food insecurity:    Worry: Not on file    Inability: Not on file  . Transportation needs:    Medical: Not on file    Non-medical: Not on file  Tobacco Use  . Smoking status: Never Smoker  . Smokeless tobacco: Never  Used  Substance and Sexual Activity  . Alcohol use: No    Frequency: Never  . Drug use: No  . Sexual activity: Not on file  Lifestyle  . Physical activity:    Days per week: Not on file    Minutes per session: Not on file  . Stress: Not on file  Relationships  . Social connections:    Talks on phone: Not on file    Gets together: Not on file    Attends religious service: Not on file    Active member of club or organization: Not on file    Attends  meetings of clubs or organizations: Not on file    Relationship status: Not on file  Other Topics Concern  . Not on file  Social History Narrative  . Not on file     Family History: The patient's family history includes Hypertension in her mother and other.  ROS:   Please see the history of present illness.     All other systems reviewed and are negative.  EKGs/Labs/Other Studies Reviewed:    The following studies were reviewed today:  Left heart cath 09/11/17  Ost Ramus to Ramus lesion is 70% stenosed.  Prox Cx to Mid Cx lesion is 65% stenosed.  Mid RCA to Dist RCA lesion is 50% stenosed.  LV end diastolic pressure is mildly elevated.  There is moderate left ventricular systolic dysfunction.  The left ventricular ejection fraction is 35-45% by visual estimate.   1. Moderate nonobstructive CAD 2. Moderate LV dysfunction. 3. Mildly elevated LVEDP  Plan: medical management.    Echo 10/20/17: Study Conclusions - Left ventricle: The cavity size was mildly dilated. Wall   thickness was increased in a pattern of mild LVH. Basal inferior   akinesis. The estimated ejection fraction was 50%. Features are   consistent with a pseudonormal left ventricular filling pattern,   with concomitant abnormal relaxation and increased filling   pressure (grade 2 diastolic dysfunction). - Aortic valve: There was no stenosis. - Mitral valve: Mildly calcified annulus. Mildly calcified leaflets   . There was mild to moderate  regurgitation. - Left atrium: The atrium was severely dilated. - Right ventricle: The cavity size was normal. Systolic function   was normal. - Tricuspid valve: Peak RV-RA gradient (S): 25 mm Hg. - Pulmonary arteries: PA peak pressure: 33 mm Hg (S). - Systemic veins: IVC measured 2.4 cm with > 50% respirophasic   variation, suggesting RA pressure 8 mmHg.  Impressions: - Mildly dilated LV with mild LV hypertrophy. EF 50% with basal   inferior akinesis. Moderate diastolic dysfunction. Normal RV size   and systolic function. Severe left atrial enlargement. Mild to   moderate MR.   EKG:  EKG is not ordered today.    Recent Labs: 09/03/2017: TSH 2.308 10/19/2017: B Natriuretic Peptide 1,539.6; Hemoglobin 8.8; Magnesium 1.5; Platelets 330 11/01/2017: ALT 15; BUN 17; Creatinine, Ser 1.42; Potassium 3.7; Sodium 144  Recent Lipid Panel    Component Value Date/Time   CHOL 130 10/20/2017 0444   TRIG 74 10/20/2017 0444   HDL 34 (L) 10/20/2017 0444   CHOLHDL 3.8 10/20/2017 0444   VLDL 15 10/20/2017 0444   LDLCALC 81 10/20/2017 0444    Physical Exam:    VS:  BP (!) 142/78   Pulse 79   Ht 5' 4.5" (1.638 m)   Wt 175 lb 12.8 oz (79.7 kg)   SpO2 97%   BMI 29.71 kg/m     Wt Readings from Last 3 Encounters:  11/22/17 175 lb 12.8 oz (79.7 kg)  11/01/17 172 lb 3.2 oz (78.1 kg)  10/23/17 176 lb 12.9 oz (80.2 kg)     GEN: Well nourished, well developed in no acute distress HEENT: Normal NECK: No JVD; No carotid bruits LYMPHATICS: No lymphadenopathy CARDIAC: RRR, no murmurs, rubs, gallops RESPIRATORY:  Clear to auscultation without rales, wheezing or rhonchi  ABDOMEN: Soft, non-tender, non-distended MUSCULOSKELETAL:  No edema; No deformity  SKIN: Warm and dry NEUROLOGIC:  Alert and oriented x 3 PSYCHIATRIC:  Normal affect   ASSESSMENT:    1. Chronic combined systolic and diastolic  CHF (congestive heart failure) ([place])   2. Dyspnea on exertion   3. Essential hypertension   4.  Coronary artery disease involving native coronary artery of native heart with angina pectoris ([place])   5. History of cardiac arrest   6. Hyperlipidemia, unspecified hyperlipidemia type    PLAN:    In order of problems listed above:  Chronic combined systolic and diastolic CHF (congestive heart failure) ([facility])  Diuresing on 40 mg lasix BID. Weight today is 175 lbs. She is also taking 20 mEq potassium BID. We will check a BMP today. Pending those results, will increase spironolactone to 50 mg for better pressure control and decrease potassium to 20 mEq daily. Will repeat BMP 1 week after medication changes.  Needs echo in 3 months and follow up with Dr. Marlou Porch at that time.   Dyspnea on exertion This is her baseline. She feels well since being discharged from the hospital. Discussed walking program to increase stamina.   Essential hypertension  Pressure not at [place]. Current regimen: coreg 25 mg BID, aldactone 25 mg daily, norvasc 10 mg daily, hydralazine 100 mg TID, and losartan 100 mg daily. Medication changes as above. Will increase aldactone after BMP today with decrease in K supplementation. Will check another BMP in 1 week.    Coronary artery disease involving native coronary artery of native heart with angina pectoris ([place]) History of cardiac arrest Pt denies anginal symptoms. Continue ASA. No bleeding problems.   Hyperlipidemia, unspecified hyperlipidemia type 10/20/2017: Cholesterol 130; HDL 34; LDL Cholesterol 81; Triglycerides 74; VLDL 15 LDL is not at goal. Pt reports body aches associated with the liptor. Will D/C 40 mg lipitor and start 40 mg crestor. Repeat FLP and LFTs in 6-8 weeks.     Medication Adjustments/Labs and Tests Ordered: Current medicines are reviewed at length with the patient today.  Concerns regarding medicines are outlined above.  Orders Placed This Encounter  Procedures  . Basic metabolic panel  . Basic metabolic panel  . Lipid panel  . Hepatic function  panel  . ECHOCARDIOGRAM COMPLETE   Meds ordered this encounter  Medications  . carvedilol (COREG) 25 MG tablet    Sig: Take 1 tablet (25 mg total) by mouth 2 (two) times daily with a meal.    Dispense:  60 tablet    Refill:  0  . spironolactone (ALDACTONE) 50 MG tablet    Sig: Take 1 tablet (50 mg total) by mouth daily.    Dispense:  30 tablet    Refill:  3  . rosuvastatin (CRESTOR) 40 MG tablet    Sig: Take 1 tablet (40 mg total) by mouth daily.    Dispense:  30 tablet    Refill:  3    Signed, Ledora Bottcher, [place]  11/22/2017 11:13 AM    [facility]

## 2017-11-22 ENCOUNTER — Telehealth: Payer: Self-pay | Admitting: Family Medicine

## 2017-11-22 ENCOUNTER — Encounter: Payer: Self-pay | Admitting: Physician Assistant

## 2017-11-22 ENCOUNTER — Telehealth: Payer: Self-pay | Admitting: *Deleted

## 2017-11-22 ENCOUNTER — Ambulatory Visit (INDEPENDENT_AMBULATORY_CARE_PROVIDER_SITE_OTHER): Payer: Medicaid Other | Admitting: Physician Assistant

## 2017-11-22 VITALS — BP 142/78 | HR 79 | Ht 64.5 in | Wt 175.8 lb

## 2017-11-22 DIAGNOSIS — Z8674 Personal history of sudden cardiac arrest: Secondary | ICD-10-CM | POA: Diagnosis not present

## 2017-11-22 DIAGNOSIS — I25119 Atherosclerotic heart disease of native coronary artery with unspecified angina pectoris: Secondary | ICD-10-CM

## 2017-11-22 DIAGNOSIS — I10 Essential (primary) hypertension: Secondary | ICD-10-CM

## 2017-11-22 DIAGNOSIS — R0609 Other forms of dyspnea: Secondary | ICD-10-CM

## 2017-11-22 DIAGNOSIS — I5042 Chronic combined systolic (congestive) and diastolic (congestive) heart failure: Secondary | ICD-10-CM | POA: Diagnosis not present

## 2017-11-22 DIAGNOSIS — E785 Hyperlipidemia, unspecified: Secondary | ICD-10-CM | POA: Diagnosis not present

## 2017-11-22 LAB — BASIC METABOLIC PANEL
BUN / CREAT RATIO: 11 — AB (ref 12–28)
BUN: 15 mg/dL (ref 8–27)
CALCIUM: 9.2 mg/dL (ref 8.7–10.3)
CHLORIDE: 103 mmol/L (ref 96–106)
CO2: 23 mmol/L (ref 20–29)
Creatinine, Ser: 1.37 mg/dL — ABNORMAL HIGH (ref 0.57–1.00)
GFR, EST AFRICAN AMERICAN: 48 mL/min/{1.73_m2} — AB (ref >59)
GFR, EST NON AFRICAN AMERICAN: 42 mL/min/{1.73_m2} — AB (ref >59)
Glucose: 123 mg/dL — ABNORMAL HIGH (ref 65–99)
POTASSIUM: 3.7 mmol/L (ref 3.5–5.2)
Sodium: 142 mmol/L (ref 134–144)

## 2017-11-22 MED ORDER — POTASSIUM CHLORIDE CRYS ER 20 MEQ PO TBCR: 10.0000 meq | EXTENDED_RELEASE_TABLET | Freq: Every day | ORAL | 3 refills | Status: DC

## 2017-11-22 MED ORDER — CARVEDILOL 25 MG PO TABS: 25.0000 mg | ORAL_TABLET | Freq: Two times a day (BID) | ORAL | 0 refills | Status: DC

## 2017-11-22 MED ORDER — SPIRONOLACTONE 50 MG PO TABS: 50.0000 mg | ORAL_TABLET | Freq: Every day | ORAL | 3 refills | Status: DC

## 2017-11-22 MED ORDER — ROSUVASTATIN CALCIUM 40 MG PO TABS: 40.0000 mg | ORAL_TABLET | Freq: Every day | ORAL | 3 refills | Status: DC

## 2017-11-22 MED ORDER — SPIRONOLACTONE 50 MG PO TABS: 25.0000 mg | ORAL_TABLET | Freq: Every day | ORAL | 3 refills | Status: DC

## 2017-11-22 MED FILL — ROSUVASTATIN CALCIUM 40 MG: 40 | 30 days supply | Qty: 30 | Fill #0

## 2017-11-22 MED FILL — CARVEDILOL 25 MG TABLET: 25 | 30 days supply | Qty: 60 | Fill #0

## 2017-11-22 MED FILL — SPIRONOLACTONE 50 MG TABLET: 50 | 30 days supply | Qty: 30 | Fill #0

## 2017-11-22 NOTE — Telephone Encounter (Signed)
SPOKE WITH PT ABOUT RESULTS AND VERBALIZED UNDERSTANDING.  ALSO PT WAS TOLD TO TAKE SPRINOLACTONE 50 MG ONCE A DAY AND POTASSIUM 10 MEQ ONCE A DAY PER [facility]  ATTEMPT TO CONTACT PHARMACY  FOR CHANGES WAS CLOSED WITH SPECIAL HOURS FOR CERTAIN DATES.  PT HAS BEEN SCHEDULED FOR F/U BMET AND F/UP WITH APP

## 2017-11-22 NOTE — Telephone Encounter (Signed)
Call placed to patient [phone] to check on her status. No answer. Left patient a message asking her to return my call at [phone].

## 2017-11-22 NOTE — Telephone Encounter (Signed)
-----   Message from Ledora Bottcher, [place] sent at 11/22/2017  4:10 PM EDT ----- Your labs were normal. Please increase spironolactone to 50 mg daily and decrease potassium to 20 mEq once daily.  Please call the pharmacy and adjust her dose of spironolactone, may need a higher quantity.

## 2017-11-22 NOTE — Patient Instructions (Addendum)
Medication Instructions:   STOP TAKING LIPTOR    START TAKING CRESTOR  40 MG ONCE  A DAY    If you need a refill on your cardiac medications before your next appointment, please call your pharmacy.  Labwork: BMET TODAY  RETURN IN ONE WEEK FOR BMET    RETURN IN TWO  MONTHS  FOR FASTING LIPIDS AND LIVER     Testing/Procedures: IN 3 MONTHS Your physician has requested that you have an echocardiogram. Echocardiography is a painless test that uses sound waves to create images of your heart. It provides your doctor with information about the size and shape of your heart and how well your heart's chambers and valves are working. This procedure takes approximately one hour. There are no restrictions for this procedure.    Follow-Up: IN 3 MONTHS WITH DR CROITORU   FIRST AVAILABLE   AFTER ECHO    Any Other Special Instructions Will Be Listed Below (If Applicable).

## 2017-11-25 ENCOUNTER — Other Ambulatory Visit: Payer: Self-pay

## 2017-11-25 MED ORDER — INSULIN ASPART 100 UNIT/ML ~~LOC~~ SOLN: 4.0000 [IU] | Freq: Three times a day (TID) | SUBCUTANEOUS | 0 refills | Status: DC

## 2017-11-25 MED FILL — POTASSIUM CL ER 20 MEQ TAB: 20 | 30 days supply | Qty: 15 | Fill #0

## 2017-11-25 MED FILL — NovoLOG 100 UNIT/ML SOLN: 100 | 28 days supply | Qty: 10 | Fill #0

## 2017-11-26 MED FILL — CETIRIZINE HCL 10 MG TABS: 10 | 30 days supply | Qty: 30 | Fill #1

## 2017-11-29 ENCOUNTER — Other Ambulatory Visit: Payer: Medicaid Other

## 2017-12-02 ENCOUNTER — Ambulatory Visit: Payer: Medicaid Other | Admitting: Family Medicine

## 2017-12-03 ENCOUNTER — Encounter: Payer: Self-pay | Admitting: Cardiovascular Disease

## 2017-12-03 NOTE — Telephone Encounter (Signed)
Note created in error.

## 2017-12-09 MED FILL — LOSARTAN POTASSIUM 100 MG T: 100 | 30 days supply | Qty: 30 | Fill #2

## 2017-12-09 MED FILL — FUROSEMIDE 20 MG TABLET: 20 | 30 days supply | Qty: 30 | Fill #0

## 2017-12-09 MED FILL — metFORMIN HCL 1000 MG TABS: 1000 | 30 days supply | Qty: 60 | Fill #2

## 2017-12-09 MED FILL — GABAPENTIN 300 MG CAPSULE: 300 | 30 days supply | Qty: 90 | Fill #2

## 2017-12-09 MED FILL — FLUoxetine HCL 20 MG CAPS: 20 | 30 days supply | Qty: 30 | Fill #2

## 2017-12-17 MED FILL — AMLODIPINE BESYLATE 10 MG T: 10 | 30 days supply | Qty: 30 | Fill #2

## 2017-12-20 MED FILL — LANTUS SOLOSTAR 100 UNITS/M: 100 | 31 days supply | Qty: 15 | Fill #1

## 2017-12-20 MED FILL — TRUEPLUS PEN NDL 31G X 1/4: 31G X 6 MM | 30 days supply | Qty: 100 | Fill #1

## 2017-12-20 MED FILL — ACCU-CHEK AVIVA PLUS TEST S: 25 days supply | Qty: 100 | Fill #2

## 2017-12-20 MED FILL — ACCU-CHEK SOFTCLIX LANCETS: 25 days supply | Qty: 100 | Fill #2

## 2017-12-20 MED FILL — hydrALAZINE HCL 100 MG TABS: 100 | 30 days supply | Qty: 90 | Fill #3

## 2017-12-20 MED FILL — TRUEPLUS PEN NDL 31G X 1/4": 31G X 6 MM | 30 days supply | Qty: 100 | Fill #1

## 2017-12-20 MED FILL — CARVEDILOL 25 MG TABLET: 25 | 30 days supply | Qty: 60 | Fill #0

## 2017-12-27 ENCOUNTER — Ambulatory Visit: Payer: Medicaid Other | Admitting: Interventional Cardiology

## 2017-12-31 ENCOUNTER — Encounter: Payer: Self-pay | Admitting: Internal Medicine

## 2018-01-02 ENCOUNTER — Institutional Professional Consult (permissible substitution): Payer: Medicaid Other | Admitting: Internal Medicine

## 2018-01-03 ENCOUNTER — Telehealth: Payer: Self-pay | Admitting: Cardiovascular Disease

## 2018-01-03 NOTE — Telephone Encounter (Signed)
Received incoming records from [facility] for up coming appointment on 02/24/2018 @ 2:20pm with Dr Sallyanne Kuster.  Records given to [facility] in Medical Records 5//24/2019 [facility]

## 2018-01-10 ENCOUNTER — Other Ambulatory Visit: Payer: Self-pay | Admitting: Family Medicine

## 2018-01-10 MED FILL — ACCU-CHEK AVIVA PLUS TEST S: 25 days supply | Qty: 100 | Fill #3

## 2018-01-10 MED FILL — GABAPENTIN 300 MG CAPSULE: 300 | 30 days supply | Qty: 90 | Fill #3

## 2018-01-10 MED FILL — FUROSEMIDE 20 MG TABLET: 20 | 30 days supply | Qty: 30 | Fill #1

## 2018-01-10 MED FILL — LOSARTAN POTASSIUM 100 MG T: 100 | 30 days supply | Qty: 30 | Fill #3

## 2018-01-10 MED FILL — ACCU-CHEK SOFTCLIX LANCETS: 25 days supply | Qty: 100 | Fill #3

## 2018-01-10 MED FILL — SPIRONOLACTONE 50 MG TABLET: 50 | 30 days supply | Qty: 30 | Fill #1

## 2018-01-10 MED FILL — FLUoxetine HCL 20 MG CAPS: 20 | 30 days supply | Qty: 30 | Fill #3

## 2018-01-10 MED FILL — AMLODIPINE BESYLATE 10 MG T: 10 | 30 days supply | Qty: 30 | Fill #3

## 2018-01-10 MED FILL — metFORMIN HCL 1000 MG TABS: 1000 | 30 days supply | Qty: 60 | Fill #3

## 2018-01-10 MED FILL — POTASSIUM CL ER 20 MEQ TAB: 20 | 30 days supply | Qty: 15 | Fill #1

## 2018-01-10 MED FILL — ROSUVASTATIN CALCIUM 40 MG: 40 | 30 days supply | Qty: 30 | Fill #1

## 2018-01-13 MED FILL — NovoLOG 100 UNIT/ML SOLN: 100 | 28 days supply | Qty: 10 | Fill #0

## 2018-01-20 ENCOUNTER — Other Ambulatory Visit: Payer: Medicaid Other

## 2018-01-21 ENCOUNTER — Encounter: Payer: Self-pay | Admitting: Family Medicine

## 2018-01-21 ENCOUNTER — Ambulatory Visit: Payer: Medicaid Other | Attending: Family Medicine | Admitting: Family Medicine

## 2018-01-21 ENCOUNTER — Ambulatory Visit: Payer: Medicaid Other | Admitting: Family Medicine

## 2018-01-21 VITALS — BP 130/60 | HR 80 | Temp 97.9°F | Ht 64.5 in | Wt 181.8 lb

## 2018-01-21 DIAGNOSIS — E1142 Type 2 diabetes mellitus with diabetic polyneuropathy: Secondary | ICD-10-CM | POA: Diagnosis not present

## 2018-01-21 DIAGNOSIS — I5042 Chronic combined systolic (congestive) and diastolic (congestive) heart failure: Secondary | ICD-10-CM | POA: Insufficient documentation

## 2018-01-21 DIAGNOSIS — Z881 Allergy status to other antibiotic agents status: Secondary | ICD-10-CM | POA: Diagnosis not present

## 2018-01-21 DIAGNOSIS — Z96651 Presence of right artificial knee joint: Secondary | ICD-10-CM | POA: Insufficient documentation

## 2018-01-21 DIAGNOSIS — Z8674 Personal history of sudden cardiac arrest: Secondary | ICD-10-CM | POA: Diagnosis not present

## 2018-01-21 DIAGNOSIS — J329 Chronic sinusitis, unspecified: Secondary | ICD-10-CM | POA: Insufficient documentation

## 2018-01-21 DIAGNOSIS — F329 Major depressive disorder, single episode, unspecified: Secondary | ICD-10-CM | POA: Insufficient documentation

## 2018-01-21 DIAGNOSIS — E109 Type 1 diabetes mellitus without complications: Secondary | ICD-10-CM

## 2018-01-21 DIAGNOSIS — E785 Hyperlipidemia, unspecified: Secondary | ICD-10-CM | POA: Diagnosis not present

## 2018-01-21 DIAGNOSIS — Z91012 Allergy to eggs: Secondary | ICD-10-CM | POA: Insufficient documentation

## 2018-01-21 DIAGNOSIS — Z7982 Long term (current) use of aspirin: Secondary | ICD-10-CM | POA: Insufficient documentation

## 2018-01-21 DIAGNOSIS — J328 Other chronic sinusitis: Secondary | ICD-10-CM | POA: Diagnosis not present

## 2018-01-21 DIAGNOSIS — E1122 Type 2 diabetes mellitus with diabetic chronic kidney disease: Secondary | ICD-10-CM | POA: Insufficient documentation

## 2018-01-21 DIAGNOSIS — N189 Chronic kidney disease, unspecified: Secondary | ICD-10-CM | POA: Diagnosis not present

## 2018-01-21 DIAGNOSIS — Z91018 Allergy to other foods: Secondary | ICD-10-CM | POA: Insufficient documentation

## 2018-01-21 DIAGNOSIS — E119 Type 2 diabetes mellitus without complications: Secondary | ICD-10-CM | POA: Diagnosis present

## 2018-01-21 DIAGNOSIS — J810 Acute pulmonary edema: Secondary | ICD-10-CM

## 2018-01-21 DIAGNOSIS — B350 Tinea barbae and tinea capitis: Secondary | ICD-10-CM | POA: Insufficient documentation

## 2018-01-21 DIAGNOSIS — Z79899 Other long term (current) drug therapy: Secondary | ICD-10-CM | POA: Diagnosis not present

## 2018-01-21 DIAGNOSIS — I130 Hypertensive heart and chronic kidney disease with heart failure and stage 1 through stage 4 chronic kidney disease, or unspecified chronic kidney disease: Secondary | ICD-10-CM | POA: Diagnosis not present

## 2018-01-21 DIAGNOSIS — I10 Essential (primary) hypertension: Secondary | ICD-10-CM | POA: Diagnosis not present

## 2018-01-21 DIAGNOSIS — F419 Anxiety disorder, unspecified: Secondary | ICD-10-CM | POA: Diagnosis not present

## 2018-01-21 DIAGNOSIS — Z794 Long term (current) use of insulin: Secondary | ICD-10-CM | POA: Diagnosis not present

## 2018-01-21 LAB — POCT GLYCOSYLATED HEMOGLOBIN (HGB A1C): HBA1C, POC (CONTROLLED DIABETIC RANGE): 6.4 % (ref 0.0–7.0)

## 2018-01-21 LAB — GLUCOSE, POCT (MANUAL RESULT ENTRY): POC Glucose: 220 mg/dl — AB (ref 70–99)

## 2018-01-21 MED ORDER — LOSARTAN POTASSIUM 100 MG PO TABS: 100.0000 mg | ORAL_TABLET | Freq: Every day | ORAL | 3 refills | Status: DC

## 2018-01-21 MED ORDER — CETIRIZINE HCL 10 MG PO TABS: 10.0000 mg | ORAL_TABLET | Freq: Every day | ORAL | 1 refills | Status: DC

## 2018-01-21 MED ORDER — FUROSEMIDE 40 MG PO TABS: 40.0000 mg | ORAL_TABLET | Freq: Two times a day (BID) | ORAL | 3 refills | Status: DC

## 2018-01-21 MED ORDER — BUSPIRONE HCL 5 MG PO TABS: 5.0000 mg | ORAL_TABLET | Freq: Three times a day (TID) | ORAL | 3 refills | Status: AC

## 2018-01-21 MED ORDER — CEPHALEXIN 500 MG PO CAPS: 500.0000 mg | ORAL_CAPSULE | Freq: Two times a day (BID) | ORAL | 0 refills | Status: DC

## 2018-01-21 MED ORDER — GABAPENTIN 300 MG PO CAPS: 300.0000 mg | ORAL_CAPSULE | Freq: Three times a day (TID) | ORAL | 3 refills | Status: DC

## 2018-01-21 MED ORDER — INSULIN ASPART 100 UNIT/ML ~~LOC~~ SOLN: 4.0000 [IU] | Freq: Three times a day (TID) | SUBCUTANEOUS | 3 refills | Status: DC

## 2018-01-21 MED ORDER — CARVEDILOL 25 MG PO TABS: 25.0000 mg | ORAL_TABLET | Freq: Two times a day (BID) | ORAL | 0 refills | Status: DC

## 2018-01-21 MED ORDER — TERBINAFINE HCL 250 MG PO TABS: 250.0000 mg | ORAL_TABLET | Freq: Every day | ORAL | 0 refills | Status: DC

## 2018-01-21 MED ORDER — KETOCONAZOLE 2 % EX SHAM: 1.0000 "application " | MEDICATED_SHAMPOO | CUTANEOUS | 0 refills | Status: DC

## 2018-01-21 MED ORDER — INSULIN GLARGINE 100 UNIT/ML SOLOSTAR PEN: 20.0000 [IU] | PEN_INJECTOR | Freq: Two times a day (BID) | SUBCUTANEOUS | 3 refills | Status: DC

## 2018-01-21 MED ORDER — METFORMIN HCL 1000 MG PO TABS: 1000.0000 mg | ORAL_TABLET | Freq: Two times a day (BID) | ORAL | 3 refills | Status: DC

## 2018-01-21 MED ORDER — HYDRALAZINE HCL 100 MG PO TABS: 100.0000 mg | ORAL_TABLET | Freq: Three times a day (TID) | ORAL | 3 refills | Status: DC

## 2018-01-21 MED ORDER — ROSUVASTATIN CALCIUM 40 MG PO TABS: 40.0000 mg | ORAL_TABLET | Freq: Every day | ORAL | 3 refills | Status: AC

## 2018-01-21 MED ORDER — SPIRONOLACTONE 50 MG PO TABS: 50.0000 mg | ORAL_TABLET | Freq: Every day | ORAL | 3 refills | Status: AC

## 2018-01-21 MED ORDER — MONTELUKAST SODIUM 10 MG PO TABS: 10.0000 mg | ORAL_TABLET | Freq: Every day | ORAL | 3 refills | Status: AC

## 2018-01-21 MED ORDER — POTASSIUM CHLORIDE CRYS ER 10 MEQ PO TBCR: 10.0000 meq | EXTENDED_RELEASE_TABLET | Freq: Every day | ORAL | 3 refills | Status: AC

## 2018-01-21 MED ORDER — FLUOXETINE HCL 20 MG PO CAPS: 20.0000 mg | ORAL_CAPSULE | Freq: Every day | ORAL | 3 refills | Status: DC

## 2018-01-21 MED ORDER — AMLODIPINE BESYLATE 10 MG PO TABS
10.0000 mg | ORAL_TABLET | Freq: Every day | ORAL | 3 refills | Status: DC
Start: 2018-01-21 — End: 2018-06-24

## 2018-01-21 MED FILL — TERBINAFINE HCL 250 MG TAB: 250 | 30 days supply | Qty: 30 | Fill #0

## 2018-01-21 MED FILL — KETOCONAZOLE 2% SHAMPOO: 2 | 14 days supply | Qty: 120 | Fill #0

## 2018-01-21 MED FILL — MONTELUKAST SOD 10 MG TAB: 10 | 30 days supply | Qty: 30 | Fill #0

## 2018-01-21 MED FILL — LANTUS SOLOSTAR 100 UNITS/M: 100 | 30 days supply | Qty: 15 | Fill #0

## 2018-01-21 MED FILL — CEPHALEXIN 500 MG CAPSULE: 500 | 10 days supply | Qty: 20 | Fill #0

## 2018-01-21 MED FILL — CARVEDILOL 25 MG TABLET: 25 | 30 days supply | Qty: 60 | Fill #0

## 2018-01-21 MED FILL — hydrALAZINE HCL 100 MG TABS: 100 | 30 days supply | Qty: 90 | Fill #0

## 2018-01-21 MED FILL — busPIRone HCL 5 MG TABS: 5 | 30 days supply | Qty: 90 | Fill #0

## 2018-01-21 MED FILL — CETIRIZINE HCL 10 MG TABS: 10 | 30 days supply | Qty: 30 | Fill #0

## 2018-01-21 NOTE — Progress Notes (Signed)
C/C:patient has bumps in scalp.

## 2018-01-21 NOTE — Progress Notes (Signed)
Subjective:  Patient ID: Brenda Sims, female    DOB: March 04, 1956  Age: 62 y.o. MRN: [id number]  CC: Diabetes   HPI Brenda Sims is a 62 year old female with a history of type 2 diabetes mellitus (A1c 6.4), chronic kidney disease, recent cardiac arrest, CHF (EF 50%), hypertension, hospitalization in 10/2017 for acute pulmonary edema here for follow-up visit.  She complains of bumps on her scalp which are itchy and painful and sometimes have purulent discharge which ones on her face; symptoms have been present for the last 5 months and she had to cut her hair due to her symptoms.  Denies the presence of rash on her body.  She has been compliant with her insulin and denies hypoglycemia, numbness in extremities or visual concerns.  Doing well on her antihypertensive and statin and denies adverse effects. With regards to her CHF she denies shortness of breath, pedal edema, weight gain and has a good exercise tolerance. Her anxiety and depression are controlled on her current regimen.  Past Medical History:  Diagnosis Date  . CHF (congestive heart failure) ([place])   . CKD (chronic kidney disease)   . Diabetes mellitus without complication ([place])   . Hypertension     Past Surgical History:  Procedure Laterality Date  . APPENDECTOMY    . BACK SURGERY    . LEFT HEART CATH AND CORONARY ANGIOGRAPHY N/A 09/11/2017   Procedure: LEFT HEART CATH AND CORONARY ANGIOGRAPHY;  Surgeon: Martinique, Peter M, MD;  Location: [place] [facility];  Service: Cardiovascular;  Laterality: N/A;  . TOTAL KNEE ARTHROPLASTY Right 2007    Allergies  Allergen Reactions  . Eggs Or Egg-Derived Products Shortness Of Breath and Rash  . Azithromycin Swelling    Facial/throat swelling  . Cherry Swelling    Facial swelling     Outpatient Medications Prior to Visit  Medication Sig Dispense Refill  . ACCU-CHEK SOFTCLIX LANCETS lancets Use as instructed 100 each 12  . acetaminophen (TYLENOL) 500 MG tablet Take 1,000 mg by mouth  every 6 (six) hours as needed for headache (pain).    Marland Kitchen aspirin EC 81 MG tablet Take 1 tablet (81 mg total) by mouth daily. 150 tablet 0  . Blood Glucose Monitoring Suppl (ACCU-CHEK AVIVA) device Use as instructed 1 each 0  . Blood Glucose Monitoring Suppl (TRUE METRIX METER) DEVI 1 kit by Does not apply route 4 (four) times daily. 1 Device 0  . glucose blood (ACCU-CHEK AVIVA) test strip Use as instructed 100 each 12  . glucose blood (TRUE METRIX BLOOD GLUCOSE TEST) test strip Use as instructed 120 each 12  . guaiFENesin-dextromethorphan (ROBITUSSIN DM) 100-10 MG/5ML syrup Take 5 mLs by mouth every 4 (four) hours as needed for cough. 118 mL 0  . Insulin Pen Needle (TRUEPLUS PEN NEEDLES) 31G X 6 MM MISC Use as directed 100 each 2  . TRUEPLUS LANCETS 28G MISC 28 g by Does not apply route 4 (four) times daily. 120 each 3  . amLODipine (NORVASC) 10 MG tablet Take 1 tablet (10 mg total) by mouth daily. 30 tablet 3  . busPIRone (BUSPAR) 5 MG tablet Take 1 tablet (5 mg total) by mouth 3 (three) times daily. 90 tablet 3  . carvedilol (COREG) 25 MG tablet Take 1 tablet (25 mg total) by mouth 2 (two) times daily with a meal. 60 tablet 0  . cetirizine (ZYRTEC) 10 MG tablet Take 1 tablet (10 mg total) by mouth daily. 30 tablet 1  . FLUoxetine (PROZAC) 20  MG capsule Take 1 capsule (20 mg total) by mouth daily. 30 capsule 3  . furosemide (LASIX) 40 MG tablet Take 1 tablet (40 mg total) by mouth 2 (two) times daily. 60 tablet 3  . gabapentin (NEURONTIN) 300 MG capsule Take 1 capsule (300 mg total) by mouth 3 (three) times daily. 90 capsule 3  . hydrALAZINE (APRESOLINE) 100 MG tablet Take 1 tablet (100 mg total) by mouth 3 (three) times daily. 90 tablet 3  . Insulin Glargine (LANTUS SOLOSTAR) 100 UNIT/ML Solostar Pen Inject 20 Units into the skin 2 (two) times daily. 5 pen 3  . losartan (COZAAR) 100 MG tablet Take 1 tablet (100 mg total) by mouth daily. 30 tablet 3  . metFORMIN (GLUCOPHAGE) 1000 MG tablet Take 1  tablet (1,000 mg total) by mouth 2 (two) times daily with a meal. 60 tablet 3  . montelukast (SINGULAIR) 10 MG tablet Take 1 tablet (10 mg total) by mouth at bedtime. 30 tablet 3  . NOVOLOG 100 UNIT/ML injection INJECT 4 UNITS INTO THE SKIN 3 (THREE) TIMES DAILY WITH MEALS. 10 mL 0  . potassium chloride SA (K-DUR,KLOR-CON) 20 MEQ tablet Take 0.5 tablets (10 mEq total) by mouth daily. 30 tablet 3  . rosuvastatin (CRESTOR) 40 MG tablet Take 1 tablet (40 mg total) by mouth daily. 30 tablet 3  . spironolactone (ALDACTONE) 50 MG tablet Take 1 tablet (50 mg total) by mouth daily. 30 tablet 3  . traMADol (ULTRAM) 50 MG tablet Take 50 mg by mouth every 8 (eight) hours as needed for pain.  0   No facility-administered medications prior to visit.     ROS Review of Systems  Constitutional: Negative for activity change, appetite change and fatigue.  HENT: Negative for congestion, sinus pressure and sore throat.   Eyes: Negative for visual disturbance.  Respiratory: Negative for cough, chest tightness, shortness of breath and wheezing.   Cardiovascular: Negative for chest pain and palpitations.  Gastrointestinal: Negative for abdominal distention, abdominal pain and constipation.  Endocrine: Negative for polydipsia.  Genitourinary: Negative for dysuria and frequency.  Musculoskeletal: Negative for arthralgias and back pain.  Skin: Positive for rash.  Neurological: Negative for tremors, light-headedness and numbness.  Hematological: Does not bruise/bleed easily.  Psychiatric/Behavioral: Negative for agitation and behavioral problems.    Objective:  BP 130/60   Pulse 80   Temp 97.9 F (36.6 C) (Oral)   Ht 5' 4.5" (1.638 m)   Wt 181 lb 12.8 oz (82.5 kg)   SpO2 90%   BMI 30.72 kg/m   BP/Weight 01/21/2018 11/22/2017 1/60/7371  Systolic BP 062 694 854  Diastolic BP 60 78 84  Wt. (Lbs) 181.8 175.8 172.2  BMI 30.72 29.71 29.56      Physical Exam  Constitutional: She is oriented to person,  place, and time. She appears well-developed and well-nourished.  Neck: No JVD present.  Cardiovascular: Normal rate, normal heart sounds and intact distal pulses.  No murmur heard. Pulmonary/Chest: Effort normal and breath sounds normal. She has no wheezes. She has no rales. She exhibits no tenderness.  Abdominal: Soft. Bowel sounds are normal. She exhibits no distension and no mass. There is no tenderness.  Musculoskeletal: Normal range of motion.  Neurological: She is alert and oriented to person, place, and time.  Skin:  Plaques on scalp with no purulent discharge  Psychiatric: She has a normal mood and affect.    Lab Results  Component Value Date   HGBA1C 6.4 01/21/2018     Assessment &  Plan:   1. Type 2 diabetes mellitus with diabetic polyneuropathy, with long-term current use of insulin (HCC) Controlled with A1c of 6.4 Counseled on Diabetic diet, my plate method, 160 minutes of moderate intensity exercise/week Keep blood sugar logs with fasting goals of 80-120 mg/dl, random of less than 180 and in the event of sugars less than 60 mg/dl or greater than 400 mg/dl please notify the clinic ASAP. It is recommended that you undergo annual eye exams and annual foot exams. Pneumonia vaccine is recommended. - POCT glucose (manual entry) - POCT glycosylated hemoglobin (Hb A1C) - gabapentin (NEURONTIN) 300 MG capsule; Take 1 capsule (300 mg total) by mouth 3 (three) times daily.  Dispense: 90 capsule; Refill: 3 - Insulin Glargine (LANTUS SOLOSTAR) 100 UNIT/ML Solostar Pen; Inject 20 Units into the skin 2 (two) times daily.  Dispense: 5 pen; Refill: 3 - metFORMIN (GLUCOPHAGE) 1000 MG tablet; Take 1 tablet (1,000 mg total) by mouth 2 (two) times daily with a meal.  Dispense: 60 tablet; Refill: 3 - insulin aspart (NOVOLOG) 100 UNIT/ML injection; Inject 4 Units into the skin 3 (three) times daily with meals.  Dispense: 10 mL; Refill: 3  2. Anxiety and depression Controlled - busPIRone  (BUSPAR) 5 MG tablet; Take 1 tablet (5 mg total) by mouth 3 (three) times daily.  Dispense: 90 tablet; Refill: 3 - FLUoxetine (PROZAC) 20 MG capsule; Take 1 capsule (20 mg total) by mouth daily.  Dispense: 30 capsule; Refill: 3  3. Essential hypertension Controlled Counseled on blood pressure goal of less than 130/80, low-sodium, DASH diet, medication compliance, 150 minutes of moderate intensity exercise per week. Discussed medication compliance, adverse effects. - CMP14+EGFR - amLODipine (NORVASC) 10 MG tablet; Take 1 tablet (10 mg total) by mouth daily.  Dispense: 30 tablet; Refill: 3 - carvedilol (COREG) 25 MG tablet; Take 1 tablet (25 mg total) by mouth 2 (two) times daily with a meal.  Dispense: 60 tablet; Refill: 0 - hydrALAZINE (APRESOLINE) 100 MG tablet; Take 1 tablet (100 mg total) by mouth 3 (three) times daily.  Dispense: 90 tablet; Refill: 3 - losartan (COZAAR) 100 MG tablet; Take 1 tablet (100 mg total) by mouth daily.  Dispense: 30 tablet; Refill: 3  4. Other chronic sinusitis Stable - cetirizine (ZYRTEC) 10 MG tablet; Take 1 tablet (10 mg total) by mouth daily.  Dispense: 30 tablet; Refill: 1  5. Acute pulmonary edema (HCC) Improved significantly Continue Lasix  6. Hyperlipidemia, unspecified hyperlipidemia type Controlled Low-cholesterol diet - rosuvastatin (CRESTOR) 40 MG tablet; Take 1 tablet (40 mg total) by mouth daily.  Dispense: 30 tablet; Refill: 3  7. Chronic combined systolic and diastolic CHF (congestive heart failure) (HCC) EF 50% Euvolemic Continue Lasix, beta-blocker, hydralazine,ARB - furosemide (LASIX) 40 MG tablet; Take 1 tablet (40 mg total) by mouth 2 (two) times daily.  Dispense: 60 tablet; Refill: 3 - potassium chloride SA (K-DUR,KLOR-CON) 10 MEQ tablet; Take 1 tablet (10 mEq total) by mouth daily.  Dispense: 30 tablet; Refill: 3 - spironolactone (ALDACTONE) 50 MG tablet; Take 1 tablet (50 mg total) by mouth daily.  Dispense: 30 tablet; Refill:  3  8. Tinea kerion - cephALEXin (KEFLEX) 500 MG capsule; Take 1 capsule (500 mg total) by mouth 2 (two) times daily.  Dispense: 20 capsule; Refill: 0 - ketoconazole (NIZORAL) 2 % shampoo; Apply 1 application topically 2 (two) times a week.  Dispense: 120 mL; Refill: 0 - terbinafine (LAMISIL) 250 MG tablet; Take 1 tablet (250 mg total) by mouth daily. For scalp  infection  Dispense: 30 tablet; Refill: 0   Meds ordered this encounter  Medications  . cephALEXin (KEFLEX) 500 MG capsule    Sig: Take 1 capsule (500 mg total) by mouth 2 (two) times daily.    Dispense:  20 capsule    Refill:  0  . ketoconazole (NIZORAL) 2 % shampoo    Sig: Apply 1 application topically 2 (two) times a week.    Dispense:  120 mL    Refill:  0  . amLODipine (NORVASC) 10 MG tablet    Sig: Take 1 tablet (10 mg total) by mouth daily.    Dispense:  30 tablet    Refill:  3  . busPIRone (BUSPAR) 5 MG tablet    Sig: Take 1 tablet (5 mg total) by mouth 3 (three) times daily.    Dispense:  90 tablet    Refill:  3  . carvedilol (COREG) 25 MG tablet    Sig: Take 1 tablet (25 mg total) by mouth 2 (two) times daily with a meal.    Dispense:  60 tablet    Refill:  0  . cetirizine (ZYRTEC) 10 MG tablet    Sig: Take 1 tablet (10 mg total) by mouth daily.    Dispense:  30 tablet    Refill:  1  . FLUoxetine (PROZAC) 20 MG capsule    Sig: Take 1 capsule (20 mg total) by mouth daily.    Dispense:  30 capsule    Refill:  3  . furosemide (LASIX) 40 MG tablet    Sig: Take 1 tablet (40 mg total) by mouth 2 (two) times daily.    Dispense:  60 tablet    Refill:  3  . gabapentin (NEURONTIN) 300 MG capsule    Sig: Take 1 capsule (300 mg total) by mouth 3 (three) times daily.    Dispense:  90 capsule    Refill:  3  . hydrALAZINE (APRESOLINE) 100 MG tablet    Sig: Take 1 tablet (100 mg total) by mouth 3 (three) times daily.    Dispense:  90 tablet    Refill:  3  . Insulin Glargine (LANTUS SOLOSTAR) 100 UNIT/ML Solostar Pen     Sig: Inject 20 Units into the skin 2 (two) times daily.    Dispense:  5 pen    Refill:  3  . losartan (COZAAR) 100 MG tablet    Sig: Take 1 tablet (100 mg total) by mouth daily.    Dispense:  30 tablet    Refill:  3  . metFORMIN (GLUCOPHAGE) 1000 MG tablet    Sig: Take 1 tablet (1,000 mg total) by mouth 2 (two) times daily with a meal.    Dispense:  60 tablet    Refill:  3  . montelukast (SINGULAIR) 10 MG tablet    Sig: Take 1 tablet (10 mg total) by mouth at bedtime.    Dispense:  30 tablet    Refill:  3  . insulin aspart (NOVOLOG) 100 UNIT/ML injection    Sig: Inject 4 Units into the skin 3 (three) times daily with meals.    Dispense:  10 mL    Refill:  3  . potassium chloride SA (K-DUR,KLOR-CON) 10 MEQ tablet    Sig: Take 1 tablet (10 mEq total) by mouth daily.    Dispense:  30 tablet    Refill:  3  . rosuvastatin (CRESTOR) 40 MG tablet    Sig: Take 1 tablet (40 mg total) by  mouth daily.    Dispense:  30 tablet    Refill:  3  . spironolactone (ALDACTONE) 50 MG tablet    Sig: Take 1 tablet (50 mg total) by mouth daily.    Dispense:  30 tablet    Refill:  3  . terbinafine (LAMISIL) 250 MG tablet    Sig: Take 1 tablet (250 mg total) by mouth daily. For scalp infection    Dispense:  30 tablet    Refill:  0    Follow-up: Return in about 1 month (around 02/20/2018) for Complete physical exam.   Charlott Rakes MD

## 2018-01-22 ENCOUNTER — Other Ambulatory Visit: Payer: Self-pay | Admitting: Family Medicine

## 2018-01-22 DIAGNOSIS — N179 Acute kidney failure, unspecified: Secondary | ICD-10-CM

## 2018-01-22 LAB — CMP14+EGFR
A/G RATIO: 1.2 (ref 1.2–2.2)
ALBUMIN: 4 g/dL (ref 3.6–4.8)
ALT: 25 IU/L (ref 0–32)
AST: 29 IU/L (ref 0–40)
Alkaline Phosphatase: 92 IU/L (ref 39–117)
BUN/Creatinine Ratio: 13 (ref 12–28)
BUN: 28 mg/dL — AB (ref 8–27)
Bilirubin Total: <0.2 mg/dL (ref 0.0–1.2)
CALCIUM: 9.6 mg/dL (ref 8.7–10.3)
CO2: 26 mmol/L (ref 20–29)
Chloride: 99 mmol/L (ref 96–106)
Creatinine, Ser: 2.12 mg/dL — ABNORMAL HIGH (ref 0.57–1.00)
GFR, EST AFRICAN AMERICAN: 28 mL/min/{1.73_m2} — AB (ref >59)
GFR, EST NON AFRICAN AMERICAN: 24 mL/min/{1.73_m2} — AB (ref >59)
GLUCOSE: 169 mg/dL — AB (ref 65–99)
Globulin, Total: 3.3 g/dL (ref 1.5–4.5)
POTASSIUM: 4.3 mmol/L (ref 3.5–5.2)
Sodium: 142 mmol/L (ref 134–144)
TOTAL PROTEIN: 7.3 g/dL (ref 6.0–8.5)

## 2018-01-24 ENCOUNTER — Telehealth: Payer: Self-pay

## 2018-01-24 NOTE — Telephone Encounter (Signed)
-----   Message from Charlott Rakes, MD sent at 01/22/2018  1:01 PM EDT ----- Kidney function has worsened compared to 2 months ago.  Please advised to increase hydration, avoid NSAIDs. Repeat labs ordered for month

## 2018-01-24 NOTE — Telephone Encounter (Signed)
Patient was called and informed of lab results. 

## 2018-02-06 ENCOUNTER — Institutional Professional Consult (permissible substitution): Payer: Medicaid Other | Admitting: Internal Medicine

## 2018-02-07 ENCOUNTER — Ambulatory Visit: Payer: Medicaid Other | Admitting: Cardiovascular Disease

## 2018-02-11 MED FILL — LOSARTAN POTASSIUM 100 MG T: 100 | 30 days supply | Qty: 30 | Fill #0

## 2018-02-11 MED FILL — AMLODIPINE BESYLATE 10 MG T: 10 | 30 days supply | Qty: 30 | Fill #0

## 2018-02-11 MED FILL — SPIRONOLACTONE 50 MG TABLET: 50 | 30 days supply | Qty: 30 | Fill #2

## 2018-02-11 MED FILL — GABAPENTIN 300 MG CAPSULE: 300 | 30 days supply | Qty: 90 | Fill #0

## 2018-02-11 MED FILL — POTASSIUM CL ER 20 MEQ TAB: 20 | 30 days supply | Qty: 15 | Fill #2

## 2018-02-17 ENCOUNTER — Ambulatory Visit (HOSPITAL_COMMUNITY): Payer: Medicaid Other | Attending: Cardiology

## 2018-02-17 ENCOUNTER — Ambulatory Visit (HOSPITAL_COMMUNITY)
Admission: EM | Admit: 2018-02-17 | Discharge: 2018-02-17 | Disposition: A | Discharge status: Home / Self Care | Payer: Medicaid Other | Attending: Family Medicine | Admitting: Family Medicine

## 2018-02-17 ENCOUNTER — Encounter (HOSPITAL_COMMUNITY): Payer: Self-pay | Admitting: *Deleted

## 2018-02-17 ENCOUNTER — Other Ambulatory Visit: Payer: Self-pay

## 2018-02-17 DIAGNOSIS — B350 Tinea barbae and tinea capitis: Secondary | ICD-10-CM

## 2018-02-17 MED ORDER — TERBINAFINE HCL 250 MG PO TABS: 250.0000 mg | ORAL_TABLET | Freq: Every day | ORAL | 0 refills | Status: AC

## 2018-02-17 MED ORDER — KETOCONAZOLE 2 % EX SHAM: MEDICATED_SHAMPOO | CUTANEOUS | 0 refills | Status: DC

## 2018-02-17 MED FILL — TERBINAFINE HCL 250 MG TAB: 250 | 30 days supply | Qty: 30 | Fill #0

## 2018-02-17 MED FILL — KETOCONAZOLE 2% SHAMPOO: 2 | 7 days supply | Qty: 120 | Fill #0

## 2018-02-17 NOTE — Discharge Instructions (Signed)
Shampoo daily for the next week then decrease to twice a week. Daily terbinafine for the next 4 weeks. Please make an appointment with your primary care provider for recheck in two weeks.

## 2018-02-17 NOTE — ED Triage Notes (Signed)
States she has a fungus in her head and had all her hair cut off, states her head is hurting, onset 1 month ago.

## 2018-02-17 NOTE — ED Provider Notes (Signed)
[facility]    CSN: [id number] Arrival date & time: 02/17/18  1425     History   Chief Complaint Chief Complaint  Patient presents with  . Headache    HPI Brenda Sims is a 62 y.o. female.   Brenda Sims presents with complaints of itching painful sores to scalp which have been ongoing. Saw her PCP 6/11 and was started on ketoconazole shampoo and oral lamisil. She states she has completed this. Symptoms have improved somewhat. Still present however. Painful and itchy. No drainage. Has since cut her hair, does wear wigs. Denies any previous similar. No other known exposures to cause skin breakdown or reaction. Hx of chf, ckd, dm, htn.     ROS per HPI.      Past Medical History:  Diagnosis Date  . CHF (congestive heart failure) ([place])   . CKD (chronic kidney disease)   . Diabetes mellitus without complication ([place])   . Hypertension     Patient Active Problem List   Diagnosis Date Noted  . Sleep apnea 11/01/2017  . HLD (hyperlipidemia) 10/19/2017  . Anxiety and depression 10/19/2017  . Chest pain 10/19/2017  . Hypoglycemia   . Hypoxia   . Bronchitis 10/07/2017  . Chronic combined systolic and diastolic CHF (congestive heart failure) ([place]) 10/07/2017  . CKD (chronic kidney disease) stage 3, GFR 30-59 ml/min (HCC) 10/07/2017  . Hypokalemia 10/07/2017  . Normocytic normochromic anemia 10/07/2017  . Type II diabetes mellitus with renal manifestations ([place])   . Leukocytosis   . Acute blood loss anemia   . Seizures ([place])   . Coronary artery disease involving native coronary artery of native heart with angina pectoris ([place]) 09/05/2017  . History of cardiac arrest 09/03/2017  . Cardiopulmonary arrest ([place])   . Acute encephalopathy   . Acute respiratory failure with hypoxia ([place])   . Acute on chronic combined systolic and diastolic CHF (congestive heart failure) ([place]) 08/29/2017  . Essential hypertension 08/29/2017  . Type 1 diabetes mellitus without complication  ([place]) 65/99/3570  . Tobacco abuse 08/29/2017  . Acute pulmonary edema (HCC)   . CKD (chronic kidney disease)   . Dyspnea on exertion     Past Surgical History:  Procedure Laterality Date  . APPENDECTOMY    . BACK SURGERY    . LEFT HEART CATH AND CORONARY ANGIOGRAPHY N/A 09/11/2017   Procedure: LEFT HEART CATH AND CORONARY ANGIOGRAPHY;  Surgeon: Martinique, Peter M, MD;  Location: [place] [facility];  Service: Cardiovascular;  Laterality: N/A;  . TOTAL KNEE ARTHROPLASTY Right 2007    OB History   None      Home Medications    Prior to Admission medications   Medication Sig Start Date End Date Taking? Authorizing Provider  ACCU-CHEK SOFTCLIX LANCETS lancets Use as instructed 10/16/17   Brayton Caves, PA-C  acetaminophen (TYLENOL) 500 MG tablet Take 1,000 mg by mouth every 6 (six) hours as needed for headache (pain).    [provider]  amLODipine (NORVASC) 10 MG tablet Take 1 tablet (10 mg total) by mouth daily. 01/21/18   Charlott Rakes, MD  aspirin EC 81 MG tablet Take 1 tablet (81 mg total) by mouth daily. 10/16/17   Brayton Caves, PA-C  Blood Glucose Monitoring Suppl (ACCU-CHEK AVIVA) device Use as instructed 10/16/17 10/16/18  Brayton Caves, PA-C  Blood Glucose Monitoring Suppl (TRUE METRIX METER) DEVI 1 kit by Does not apply route 4 (four) times daily. 10/16/17   Brayton Caves, PA-C  busPIRone (BUSPAR) 5 MG tablet Take 1 tablet (5 mg total) by mouth 3 (three) times daily. 01/21/18   Charlott Rakes, MD  carvedilol (COREG) 25 MG tablet Take 1 tablet (25 mg total) by mouth 2 (two) times daily with a meal. 01/21/18   Charlott Rakes, MD  cephALEXin (KEFLEX) 500 MG capsule Take 1 capsule (500 mg total) by mouth 2 (two) times daily. 01/21/18   Charlott Rakes, MD  cetirizine (ZYRTEC) 10 MG tablet Take 1 tablet (10 mg total) by mouth daily. 01/21/18   Charlott Rakes, MD  FLUoxetine (PROZAC) 20 MG capsule Take 1 capsule (20 mg total) by mouth daily. 01/21/18   Charlott Rakes, MD    furosemide (LASIX) 40 MG tablet Take 1 tablet (40 mg total) by mouth 2 (two) times daily. 01/21/18   Charlott Rakes, MD  gabapentin (NEURONTIN) 300 MG capsule Take 1 capsule (300 mg total) by mouth 3 (three) times daily. 01/21/18   Charlott Rakes, MD  glucose blood (ACCU-CHEK AVIVA) test strip Use as instructed 10/16/17   Brayton Caves, PA-C  glucose blood (TRUE METRIX BLOOD GLUCOSE TEST) test strip Use as instructed 10/16/17   Brayton Caves, PA-C  guaiFENesin-dextromethorphan (ROBITUSSIN DM) 100-10 MG/5ML syrup Take 5 mLs by mouth every 4 (four) hours as needed for cough. 10/08/17   Mikhail, Velta Addison, DO  hydrALAZINE (APRESOLINE) 100 MG tablet Take 1 tablet (100 mg total) by mouth 3 (three) times daily. 01/21/18   Charlott Rakes, MD  insulin aspart (NOVOLOG) 100 UNIT/ML injection Inject 4 Units into the skin 3 (three) times daily with meals. 01/21/18   Charlott Rakes, MD  Insulin Glargine (LANTUS SOLOSTAR) 100 UNIT/ML Solostar Pen Inject 20 Units into the skin 2 (two) times daily. 01/21/18   Charlott Rakes, MD  Insulin Pen Needle (TRUEPLUS PEN NEEDLES) 31G X 6 MM MISC Use as directed 11/08/17   Charlott Rakes, MD  ketoconazole (NIZORAL) 2 % shampoo Daily for the next week then twice a week until resolution 02/17/18   Augusto Gamble B, NP  losartan (COZAAR) 100 MG tablet Take 1 tablet (100 mg total) by mouth daily. 01/21/18   Charlott Rakes, MD  metFORMIN (GLUCOPHAGE) 1000 MG tablet Take 1 tablet (1,000 mg total) by mouth 2 (two) times daily with a meal. 01/21/18   Newlin, Enobong, MD  montelukast (SINGULAIR) 10 MG tablet Take 1 tablet (10 mg total) by mouth at bedtime. 01/21/18   Charlott Rakes, MD  potassium chloride SA (K-DUR,KLOR-CON) 10 MEQ tablet Take 1 tablet (10 mEq total) by mouth daily. 01/21/18   Charlott Rakes, MD  rosuvastatin (CRESTOR) 40 MG tablet Take 1 tablet (40 mg total) by mouth daily. 01/21/18 04/21/18  Charlott Rakes, MD  spironolactone (ALDACTONE) 50 MG tablet Take 1 tablet (50 mg  total) by mouth daily. 01/21/18   Charlott Rakes, MD  terbinafine (LAMISIL) 250 MG tablet Take 1 tablet (250 mg total) by mouth daily. For scalp infection 02/17/18 03/19/18  Zigmund Gottron, NP  traMADol (ULTRAM) 50 MG tablet Take 50 mg by mouth every 8 (eight) hours as needed for pain. 11/12/17   [provider]  TRUEPLUS LANCETS 28G MISC 28 g by Does not apply route 4 (four) times daily. 10/16/17   Brayton Caves, PA-C    Family History Family History  Problem Relation Age of Onset  . Hypertension Other   . Hypertension Mother     Social History Social History   Tobacco Use  . Smoking status: Never Smoker  . Smokeless  tobacco: Never Used  Substance Use Topics  . Alcohol use: No    Frequency: Never  . Drug use: No     Allergies   Eggs or egg-derived products; Azithromycin; and Cherry   Review of Systems Review of Systems   Physical Exam Triage Vital Signs ED Triage Vitals  Enc Vitals Group     BP 02/17/18 1439 (!) 149/68     Pulse Rate 02/17/18 1439 79     Resp 02/17/18 1439 16     Temp 02/17/18 1439 98.2 F (36.8 C)     Temp Source 02/17/18 1439 Oral     SpO2 02/17/18 1439 91 %     Weight --      Height --      Head Circumference --      Peak Flow --      Pain Score 02/17/18 1441 10     Pain Loc --      Pain Edu? --      Excl. in [place]? --    No data found.  Updated Vital Signs BP (!) 149/68 (BP Location: Left Arm)   Pulse 79   Temp 98.2 F (36.8 C) (Oral)   Resp 16   SpO2 91%    Physical Exam  Constitutional: She is oriented to person, place, and time. She appears well-developed and well-nourished. No distress.  Cardiovascular: Normal rate, regular rhythm and normal heart sounds.  Pulmonary/Chest: Effort normal. She has wheezes.  Neurological: She is alert and oriented to person, place, and time.  Skin: Skin is warm and dry.  Red scaly, slightly raised lesions x2 visualized to scalp; no active drainage; no fluctuance or surrounding redness       UC Treatments / Results  Labs (all labs ordered are listed, but only abnormal results are displayed) Labs Reviewed - No data to display  EKG None  Radiology No results found.  Procedures Procedures (including critical care time)  Medications Ordered in UC Medications - No data to display  Initial Impression / Assessment and Plan / UC Course  I have reviewed the triage vital signs and the nursing notes.  Pertinent labs & imaging results that were available during my care of the patient were reviewed by me and considered in my medical decision making (see chart for details).     Was given 4 weeks of lamisil and ketoconazole shampoo with some improvement without resolution. Additional oral lamisil and shampoo provided. Encouraged follow up with PCP for recheck in two weeks. Return precautions provided. Patient verbalized understanding and agreeable to plan.    Final Clinical Impressions(s) / UC Diagnoses   Final diagnoses:  Tinea capitis     Discharge Instructions     Shampoo daily for the next week then decrease to twice a week. Daily terbinafine for the next 4 weeks. Please make an appointment with your primary care provider for recheck in two weeks.     ED Prescriptions    Medication Sig Dispense Auth. Provider   terbinafine (LAMISIL) 250 MG tablet Take 1 tablet (250 mg total) by mouth daily. For scalp infection 30 tablet Augusto Gamble B, NP   ketoconazole (NIZORAL) 2 % shampoo Daily for the next week then twice a week until resolution 120 mL Augusto Gamble B, NP     Controlled Substance Prescriptions [place] Controlled Substance Registry consulted? Not Applicable   Zigmund Gottron, NP 02/17/18 1616

## 2018-02-21 MED FILL — CETIRIZINE HCL 10 MG TABLET: 10 | 30 days supply | Qty: 30 | Fill #1

## 2018-02-24 ENCOUNTER — Encounter: Payer: Self-pay | Admitting: Cardiovascular Disease

## 2018-02-24 ENCOUNTER — Ambulatory Visit (INDEPENDENT_AMBULATORY_CARE_PROVIDER_SITE_OTHER): Payer: Medicaid Other | Admitting: Cardiovascular Disease

## 2018-02-24 VITALS — BP 118/66 | HR 81 | Ht 64.5 in | Wt 185.0 lb

## 2018-02-24 DIAGNOSIS — I5042 Chronic combined systolic (congestive) and diastolic (congestive) heart failure: Secondary | ICD-10-CM

## 2018-02-24 DIAGNOSIS — D509 Iron deficiency anemia, unspecified: Secondary | ICD-10-CM | POA: Diagnosis not present

## 2018-02-24 DIAGNOSIS — I25118 Atherosclerotic heart disease of native coronary artery with other forms of angina pectoris: Secondary | ICD-10-CM | POA: Diagnosis not present

## 2018-02-24 DIAGNOSIS — I10 Essential (primary) hypertension: Secondary | ICD-10-CM | POA: Diagnosis not present

## 2018-02-24 DIAGNOSIS — N183 Chronic kidney disease, stage 3 unspecified: Secondary | ICD-10-CM

## 2018-02-24 DIAGNOSIS — J449 Chronic obstructive pulmonary disease, unspecified: Secondary | ICD-10-CM | POA: Diagnosis not present

## 2018-02-24 MED ORDER — FUROSEMIDE 40 MG PO TABS: ORAL_TABLET | ORAL | 3 refills | Status: DC

## 2018-02-24 MED FILL — FUROSEMIDE 40 MG TAB: 40 | 90 days supply | Qty: 270 | Fill #0

## 2018-02-24 NOTE — Progress Notes (Signed)
Cardiology Office Note:    Date:  02/26/2018   ID:  Brenda Sims, DOB 10-18-1955, MRN [id number]  PCP:  Charlott Rakes, MD  Cardiologist:  Sanda Klein, MD   Referring MD: Charlott Rakes, MD   Chief Complaint  Patient presents with  . Follow-up    CAD, left ventricular dysfunction    History of Present Illness:    Brenda Sims is a 62 y.o. female with a hx of coronary artery disease who presented with pulseless electrical activity arrest in the setting of severe hypoxemia, depressed left ventricular systolic function and regional wall motion abnormalities in January 2019.  Brief hospitalization for arm pain in February and had severe hypokalemia at the time.  Repeat hospitalization in March for heart failure.  She had multivessel coronary artery disease with moderate lesions and was advised medical therapy.  LVEF was 35-45% by angiography but improved to 50% by echocardiogram performed in March.    Discharge weight after the January admission was 181-1/2 pounds; her weight at March discharge was 176-1/2 pounds.  Her weight today is up to 185 pounds on our office scale.  The patient specifically denies any chest pain at rest exertion, dyspnea at rest or with exertion since she is quite sedentary), orthopnea, paroxysmal nocturnal dyspnea, syncope, palpitations, focal neurological deficits, intermittent claudication, lower extremity edema, unexplained weight gain, cough, hemoptysis.  Recurrent problems with wheezing, but this does not appear to be associated with exertion or changes in position.    Has an appointment with pulmonary (Dr. Melvyn Novas on July 18).  He has a long history of smoking, but I am not sure that COPD has been formally confirmed with pulmonary function test.  Being evaluated for iron deficiency anemia and is scheduled to see Dr. Scarlette Shorts tomorrow.  She has a referral to nephrology but has yet to have her first appointment.  Treatment with ARB was initiated in April by Dr.  Arelia Sneddon, but labs performed in June showed increase in creatinine to 2.12, from a previous baseline of approximately 1.4.  She has well-controlled diabetes mellitus with the most recent hemoglobin A1c of 6.4% in June.  5 performed in March showed an LDL cholesterol level of 81, HDL 34.  Most recent hemoglobin that I find is 8.8 in March.  Markedly microcytic indices.  Hemoglobin has oscillated 7.4-10.5 over the last 6 months.  She received a transfusion in January.  Always with marked microcytosis MCV around 67-68.  Ferritin 22, transferrin saturation 9%.  Reportedly has had Hemoccult positive stools at an outside facility.  Past Medical History:  Diagnosis Date  . Anemia   . Anxiety   . CAD (coronary artery disease)   . CHF (congestive heart failure) ([place])   . CKD (chronic kidney disease)   . COPD (chronic obstructive pulmonary disease) ([place])   . Depression   . Diabetes mellitus without complication ([place])   . Hypertension   . Hypothyroid   . MI (myocardial infarction) ([place])   . Sleep apnea    no cpap , "never gave me one"    Past Surgical History:  Procedure Laterality Date  . APPENDECTOMY    . BACK SURGERY    . LEFT HEART CATH AND CORONARY ANGIOGRAPHY N/A 09/11/2017   Procedure: LEFT HEART CATH AND CORONARY ANGIOGRAPHY;  Surgeon: Martinique, Peter M, MD;  Location: [facility];  Service: Cardiovascular;  Laterality: N/A;  . TOTAL KNEE ARTHROPLASTY Right 2007    Current Medications: Current Meds  Medication Sig  .  ACCU-CHEK SOFTCLIX LANCETS lancets Use as instructed  . acetaminophen (TYLENOL) 500 MG tablet Take 1,000 mg by mouth every 6 (six) hours as needed for headache (pain).  Marland Kitchen amLODipine (NORVASC) 10 MG tablet Take 1 tablet (10 mg total) by mouth daily.  Marland Kitchen aspirin EC 81 MG tablet Take 1 tablet (81 mg total) by mouth daily.  . Blood Glucose Monitoring Suppl (ACCU-CHEK AVIVA) device Use as instructed  . Blood Glucose Monitoring Suppl (TRUE METRIX METER) DEVI 1 kit by Does not  apply route 4 (four) times daily.  . busPIRone (BUSPAR) 5 MG tablet Take 1 tablet (5 mg total) by mouth 3 (three) times daily.  . cetirizine (ZYRTEC) 10 MG tablet Take 1 tablet (10 mg total) by mouth daily.  Marland Kitchen FLUoxetine (PROZAC) 20 MG capsule Take 1 capsule (20 mg total) by mouth daily.  . furosemide (LASIX) 40 MG tablet Take 2 tablets (80 mg total) by mouth every morning AND 1 tablet (40 mg total) every evening.  . gabapentin (NEURONTIN) 300 MG capsule Take 1 capsule (300 mg total) by mouth 3 (three) times daily.  Marland Kitchen glucose blood (ACCU-CHEK AVIVA) test strip Use as instructed  . glucose blood (TRUE METRIX BLOOD GLUCOSE TEST) test strip Use as instructed  . guaiFENesin-dextromethorphan (ROBITUSSIN DM) 100-10 MG/5ML syrup Take 5 mLs by mouth every 4 (four) hours as needed for cough.  . hydrALAZINE (APRESOLINE) 100 MG tablet Take 1 tablet (100 mg total) by mouth 3 (three) times daily.  . insulin aspart (NOVOLOG) 100 UNIT/ML injection Inject 4 Units into the skin 3 (three) times daily with meals.  . Insulin Glargine (LANTUS SOLOSTAR) 100 UNIT/ML Solostar Pen Inject 20 Units into the skin 2 (two) times daily.  . Insulin Pen Needle (TRUEPLUS PEN NEEDLES) 31G X 6 MM MISC Use as directed  . ketoconazole (NIZORAL) 2 % shampoo Daily for the next week then twice a week until resolution  . losartan (COZAAR) 100 MG tablet Take 1 tablet (100 mg total) by mouth daily.  . montelukast (SINGULAIR) 10 MG tablet Take 1 tablet (10 mg total) by mouth at bedtime.  . potassium chloride SA (K-DUR,KLOR-CON) 10 MEQ tablet Take 1 tablet (10 mEq total) by mouth daily.  . rosuvastatin (CRESTOR) 40 MG tablet Take 1 tablet (40 mg total) by mouth daily.  Marland Kitchen spironolactone (ALDACTONE) 50 MG tablet Take 1 tablet (50 mg total) by mouth daily.  Marland Kitchen terbinafine (LAMISIL) 250 MG tablet Take 1 tablet (250 mg total) by mouth daily. For scalp infection  . traMADol (ULTRAM) 50 MG tablet Take 50 mg by mouth every 8 (eight) hours as needed  for pain.  . TRUEPLUS LANCETS 28G MISC 28 g by Does not apply route 4 (four) times daily.  . [DISCONTINUED] carvedilol (COREG) 25 MG tablet Take 1 tablet (25 mg total) by mouth 2 (two) times daily with a meal.  . [DISCONTINUED] cephALEXin (KEFLEX) 500 MG capsule Take 1 capsule (500 mg total) by mouth 2 (two) times daily.  . [DISCONTINUED] furosemide (LASIX) 40 MG tablet Take 1 tablet (40 mg total) by mouth 2 (two) times daily.  . [DISCONTINUED] metFORMIN (GLUCOPHAGE) 1000 MG tablet Take 1 tablet (1,000 mg total) by mouth 2 (two) times daily with a meal.     Allergies:   Eggs or egg-derived products; Azithromycin; and Cherry   Social History   Socioeconomic History  . Marital status: Legally Separated    Spouse name: Not on file  . Number of children: 0  . Years of  education: Not on file  . Highest education level: Not on file  Occupational History  . Occupation: retired  Scientific laboratory technician  . Financial resource strain: Not on file  . Food insecurity:    Worry: Not on file    Inability: Not on file  . Transportation needs:    Medical: Not on file    Non-medical: Not on file  Tobacco Use  . Smoking status: Never Smoker  . Smokeless tobacco: Never Used  Substance and Sexual Activity  . Alcohol use: No    Frequency: Never  . Drug use: No  . Sexual activity: Not on file  Lifestyle  . Physical activity:    Days per week: Not on file    Minutes per session: Not on file  . Stress: Not on file  Relationships  . Social connections:    Talks on phone: Not on file    Gets together: Not on file    Attends religious service: Not on file    Active member of club or organization: Not on file    Attends meetings of clubs or organizations: Not on file    Relationship status: Not on file  Other Topics Concern  . Not on file  Social History Narrative  . Not on file     Family History: The patient's family history includes Hypertension in her mother. There is no history of Colon cancer  or Esophageal cancer.  ROS:   Please see the history of present illness.     All other systems reviewed and are negative.  EKGs/Labs/Other Studies Reviewed:    The following studies were reviewed today: Cath 09/11/2017 Conclusion     Ost Ramus to Ramus lesion is 70% stenosed.  Prox Cx to Mid Cx lesion is 65% stenosed.  Mid RCA to Dist RCA lesion is 50% stenosed.  LV end diastolic pressure is mildly elevated.  There is moderate left ventricular systolic dysfunction.  The left ventricular ejection fraction is 35-45% by visual estimate.  1. Moderate nonobstructive CAD 2. Moderate LV dysfunction. 3. Mildly elevated LVEDP   Echo 10/20/17:  Study Conclusions - Left ventricle: The cavity size was mildly dilated. Wall thickness was increased in a pattern of mild LVH. Basal inferior akinesis. The estimated ejection fraction was 50%. Features are consistent with a pseudonormal left ventricular filling pattern, with concomitant abnormal relaxation and increased filling pressure (grade 2 diastolic dysfunction). - Aortic valve: There was no stenosis. - Mitral valve: Mildly calcified annulus. Mildly calcified leaflets . There was mild to moderate regurgitation. - Left atrium: The atrium was severely dilated. - Right ventricle: The cavity size was normal. Systolic function was normal. - Tricuspid valve: Peak RV-RA gradient (S): 25 mm Hg. - Pulmonary arteries: PA peak pressure: 33 mm Hg (S). - Systemic veins: IVC measured 2.4 cm with > 50% respirophasic variation, suggesting RA pressure 8 mmHg.  Impressions:  - Mildly dilated LV with mild LV hypertrophy. EF 50% with basal inferior akinesis. Moderate diastolic dysfunction. Normal RV size and systolic function. Severe left atrial enlargement. Mild to moderate MR.    EKG:  EKG is ordered today.  The ekg ordered today demonstrates sinus rhythm, left atrial enlargement, left axis deviation (suspect  left anterior fascicular block rather than inferior wall MI), poor R wave progression, QTC 473 ms  Recent Labs: 09/03/2017: TSH 2.308 10/19/2017: B Natriuretic Peptide 1,539.6; Hemoglobin 8.8; Magnesium 1.5; Platelets 330 01/21/2018: ALT 25; BUN 28; Creatinine, Ser 2.12; Potassium 4.3; Sodium 142  Recent Lipid  Panel    Component Value Date/Time   CHOL 130 10/20/2017 0444   TRIG 74 10/20/2017 0444   HDL 34 (L) 10/20/2017 0444   CHOLHDL 3.8 10/20/2017 0444   VLDL 15 10/20/2017 0444   LDLCALC 81 10/20/2017 0444    Physical Exam:    VS:  BP 118/66 (BP Location: Left Arm, Patient Position: Sitting, Cuff Size: Large)   Pulse 81   Ht 5' 4.5" (1.638 m)   Wt 185 lb (83.9 kg)   BMI 31.26 kg/m     Wt Readings from Last 3 Encounters:  02/25/18 185 lb (83.9 kg)  02/24/18 185 lb (83.9 kg)  01/21/18 181 lb 12.8 oz (82.5 kg)     GEN: Mildly obese well nourished, well developed in no acute distress HEENT: Normal NECK: No JVD; No carotid bruits LYMPHATICS: No lymphadenopathy CARDIAC: RRR, no murmurs, rubs, gallops RESPIRATORY:  Clear to auscultation without rales, wheezing or rhonchi  ABDOMEN: Soft, non-tender, non-distended MUSCULOSKELETAL:  No edema; No deformity  SKIN: Warm and dry NEUROLOGIC:  Alert and oriented x 3 PSYCHIATRIC:  Normal affect   ASSESSMENT:    1. Chronic combined systolic and diastolic CHF (congestive heart failure) ([place])   2. Coronary artery disease involving native coronary artery of native heart without angina pectoris   3. Chronic obstructive pulmonary disease, unspecified COPD type ([place])   4. CKD (chronic kidney disease) stage 3, GFR 30-59 ml/min (H[facility])   5. Essential hypertension   6. Iron deficiency anemia, unspecified iron deficiency anemia type    PLAN:    In order of problems listed above:  1. CAD: Does not have angina pectoris while on treatment with beta-blockers and amlodipine.  Has widespread coronary stenoses, none of which are clearly critical or  require revascularization especially in the absence of angina.  There is no urgent reason for revascularization, especially until we clarify the cause of her anemia. 2. CHF: On initial presentation had a moderately depressed LVEF, but this has steadily improved and is just slightly less than normal by her most recent echocardiogram in March.  Clinically she appears euvolemic, but her weight has increased several pounds since her last hospital discharge.  NYHA functional class II-III.  Hard to say how much of her residual breathing problems are related to COPD and how much are due to heart failure.  We will increase her dose of diuretics slightly, trying to get her weight back down to around 180 pounds or less.  Recheck potassium and creatinine levels in a week or 2. 3. COPD: This has not been formally confirmed, but is a highly likely diagnosis.  Has an appointment with the pulmonary specialist later this week.  While treatment with a beta-blocker is clearly indicated, we can switch to much more selective agent such as bisoprolol or at least metoprolol succinate if her pulmonary specialist thinks this is important. 4. CKD 3: As far as I can tell her baseline creatinine is around 1.4 (GFR 45), but recently creatinine has hovered around 2 after initiation of an ARB. 5. HTN: Controlled.  She may not be able to tolerate higher doses of R AAS inhibitors. 6. Anemia: Clearly there is a component of iron deficiency and she is scheduled to see Dr. Henrene Pastor to discuss GI work-up.  He may also be a component of chronic disease/erythropoietin deficiency due to her kidney failure.   Medication Adjustments/Labs and Tests Ordered: Current medicines are reviewed at length with the patient today.  Concerns regarding medicines are outlined above.  Orders Placed This Encounter  Procedures  . Basic metabolic panel  . EKG 12-Lead   Meds ordered this encounter  Medications  . furosemide (LASIX) 40 MG tablet    Sig: Take 2  tablets (80 mg total) by mouth every morning AND 1 tablet (40 mg total) every evening.    Dispense:  270 tablet    Refill:  3    Patient Instructions  Medication Instructions: Dr Sallyanne Kuster has recommended making the following medication changes: 1. INCREASE Furosemide to 80 mg in the morning and 40 mg in the afternoon  Labwork: Your physician recommends that you return for lab work at your convenience.  Testing/Procedures: NONE ORDERED  Follow-up: Dr Sallyanne Kuster recommends that you schedule a follow-up appointment in 3 months.  If you need a refill on your cardiac medications before your next appointment, please call your pharmacy.    Signed, Sanda Klein, MD  02/26/2018 2:01 PM    [facility]

## 2018-02-24 NOTE — Patient Instructions (Signed)
Medication Instructions: Dr Sallyanne Kuster has recommended making the following medication changes: 1. INCREASE Furosemide to 80 mg in the morning and 40 mg in the afternoon  Labwork: Your physician recommends that you return for lab work at your convenience.  Testing/Procedures: NONE ORDERED  Follow-up: Dr Sallyanne Kuster recommends that you schedule a follow-up appointment in 3 months.  If you need a refill on your cardiac medications before your next appointment, please call your pharmacy.

## 2018-02-25 ENCOUNTER — Ambulatory Visit (INDEPENDENT_AMBULATORY_CARE_PROVIDER_SITE_OTHER): Payer: Medicaid Other | Admitting: Internal Medicine

## 2018-02-25 ENCOUNTER — Other Ambulatory Visit: Payer: Self-pay | Admitting: Family Medicine

## 2018-02-25 VITALS — BP 120/70 | HR 84 | Ht 64.5 in | Wt 185.0 lb

## 2018-02-25 DIAGNOSIS — R195 Other fecal abnormalities: Secondary | ICD-10-CM | POA: Diagnosis not present

## 2018-02-25 DIAGNOSIS — I10 Essential (primary) hypertension: Secondary | ICD-10-CM

## 2018-02-25 DIAGNOSIS — D509 Iron deficiency anemia, unspecified: Secondary | ICD-10-CM

## 2018-02-25 MED ORDER — NA SULFATE-K SULFATE-MG SULF 17.5-3.13-1.6 GM/177ML PO SOLN: 1.0000 | Freq: Once | ORAL | 0 refills | Status: AC

## 2018-02-25 MED FILL — CARVEDILOL 25 MG TABLET: 25 | 30 days supply | Qty: 60 | Fill #0

## 2018-02-25 NOTE — Patient Instructions (Signed)

## 2018-02-26 ENCOUNTER — Other Ambulatory Visit: Payer: Self-pay

## 2018-02-26 ENCOUNTER — Ambulatory Visit (HOSPITAL_COMMUNITY): Payer: Medicaid Other | Attending: Cardiology

## 2018-02-26 ENCOUNTER — Encounter: Payer: Self-pay | Admitting: Internal Medicine

## 2018-02-26 DIAGNOSIS — I5042 Chronic combined systolic (congestive) and diastolic (congestive) heart failure: Secondary | ICD-10-CM | POA: Insufficient documentation

## 2018-02-26 DIAGNOSIS — E1122 Type 2 diabetes mellitus with diabetic chronic kidney disease: Secondary | ICD-10-CM | POA: Insufficient documentation

## 2018-02-26 DIAGNOSIS — I252 Old myocardial infarction: Secondary | ICD-10-CM | POA: Insufficient documentation

## 2018-02-26 DIAGNOSIS — I130 Hypertensive heart and chronic kidney disease with heart failure and stage 1 through stage 4 chronic kidney disease, or unspecified chronic kidney disease: Secondary | ICD-10-CM | POA: Insufficient documentation

## 2018-02-26 DIAGNOSIS — J449 Chronic obstructive pulmonary disease, unspecified: Secondary | ICD-10-CM | POA: Diagnosis not present

## 2018-02-26 DIAGNOSIS — D509 Iron deficiency anemia, unspecified: Secondary | ICD-10-CM | POA: Insufficient documentation

## 2018-02-26 DIAGNOSIS — N189 Chronic kidney disease, unspecified: Secondary | ICD-10-CM | POA: Diagnosis not present

## 2018-02-26 DIAGNOSIS — I2510 Atherosclerotic heart disease of native coronary artery without angina pectoris: Secondary | ICD-10-CM | POA: Diagnosis not present

## 2018-02-26 DIAGNOSIS — G4733 Obstructive sleep apnea (adult) (pediatric): Secondary | ICD-10-CM | POA: Insufficient documentation

## 2018-02-26 NOTE — Progress Notes (Signed)
HISTORY OF PRESENT ILLNESS:  Brenda Sims is a 62 y.o. female , native of [place] who moves [place] earlier this year. She is sent today by her primary care provider regarding iron deficiency anemia. Patient also reports that she had Hemoccult-positive stool, though I do not see that documentation. She denies prior history of GI evaluations. She has MULTIPLE SIGNIFICANT medical problems and has been hospitalized 3 times this year for acute respiratory failure as well as cardiopulmonary arrest. Last hospitalization March 2019. Patient has a history of COPD, coronary artery disease with congestive heart failure, diabetes mellitus, chronic renal insufficiency, hypertension, sleep apnea, anxiety, and obesity. She reports that her breathing has been better in the past 2 months. She has had adjustment in her medical regimen she tells me. No GI medications. Her GI review of systems is remarkable only for constipation. No bleeding. No reflux symptoms. Review of outside blood work is remarkable for hemoglobin 9.5 with MCV 68. Also, iron studies reveal iron saturation of 9%. B12 level borderline at 251. Ferritin low-normal at 22. Iron initiated. Creatinine 2.02. She has been referred to nephrology. Review of abdominal x-rays are unremarkable except for feeding tube placement  REVIEW OF SYSTEMS:  All non-GI ROS negative unless otherwise stated in the history of present illness except for sinus and allergy, anxiety, back pain, hematuria, confusion, cough, depression, fatigue, hearing problems, shortness of breath, sleeping problems  Past Medical History:  Diagnosis Date  . Anemia   . Anxiety   . CAD (coronary artery disease)   . CHF (congestive heart failure) ([place])   . CKD (chronic kidney disease)   . COPD (chronic obstructive pulmonary disease) ([place])   . Depression   . Diabetes mellitus without complication ([place])   . Hypertension   . Hypothyroid   . MI (myocardial infarction) ([place])   . Sleep apnea     no cpap , "never gave me one"    Past Surgical History:  Procedure Laterality Date  . APPENDECTOMY    . BACK SURGERY    . LEFT HEART CATH AND CORONARY ANGIOGRAPHY N/A 09/11/2017   Procedure: LEFT HEART CATH AND CORONARY ANGIOGRAPHY;  Surgeon: Martinique, Peter M, MD;  Location: [facility];  Service: Cardiovascular;  Laterality: N/A;  . TOTAL KNEE ARTHROPLASTY Right 2007    Social History Carlynn Willits  reports that she has never smoked. She has never used smokeless tobacco. She reports that she does not drink alcohol or use drugs.  family history includes Hypertension in her mother.  Allergies  Allergen Reactions  . Eggs Or Egg-Derived Products Shortness Of Breath and Rash  . Azithromycin Swelling    Facial/throat swelling  . Cherry Swelling    Facial swelling       PHYSICAL EXAMINATION: Vital signs: BP 120/70   Pulse 84   Ht 5' 4.5" (1.638 m)   Wt 185 lb (83.9 kg)   BMI 31.26 kg/m   Constitutional: unhealthy well-appearing, no acute distress. Breathing easily Psychiatric: alert and oriented x3, cooperative Eyes: extraocular movements intact, anicteric, conjunctiva pink Mouth: oral pharynx moist, no lesions Neck: supple no lymphadenopathy Cardiovascular: heart regular rate and rhythm, no murmur Lungs: clear to auscultation bilaterally Abdomen: soft, obese, nontender, nondistended, no obvious ascites, no peritoneal signs, normal bowel sounds, no organomegaly Rectal:deferred until colonoscopy Extremities: no clubbing, cyanosis, trace lower extremity edema bilaterally Skin: no lesions on visible extremities Neuro: No focal deficits. Cranial nerves intact  ASSESSMENT:  #1. Iron deficiency anemia. #2. Anemia of chronic renal  disease #3. Reports Hemoccult-positive stool (outside records being requested) #4. No prior history of GI evaluations #5. Significant medical problems including insulin requiring diabetes and significant cardiopulmonary history with 3  hospitalizations earlier this year as outlined. Currently clinically stable   PLAN:  #1. Colonoscopy and upper endoscopy to evaluate iron deficiency anemia and Hemoccult-positive stool. Patient is extremely high-risk given her cardiopulmonary issues. However has been stable in recent months. We will need to adjust her diabetic medications. Also, the examinations will be scheduled at the hospital with monitored anesthesia care.The nature of the procedure, as well as the risks, benefits, and alternatives were carefully and thoroughly reviewed with the patient. Ample time for discussion and questions allowed. The patient understood, was satisfied, and agreed to proceed.  A copy of this consultation note has been sent to Dr. Margarita Rana

## 2018-02-27 ENCOUNTER — Institutional Professional Consult (permissible substitution): Payer: Medicaid Other | Admitting: Internal Medicine

## 2018-03-05 ENCOUNTER — Observation Stay (HOSPITAL_COMMUNITY)
Admission: EM | Admit: 2018-03-05 | Discharge: 2018-03-07 | Disposition: A | Discharge status: Home / Self Care | Payer: Medicaid Other | Attending: Internal Medicine | Admitting: Internal Medicine

## 2018-03-05 ENCOUNTER — Emergency Department (HOSPITAL_COMMUNITY): Payer: Medicaid Other

## 2018-03-05 ENCOUNTER — Other Ambulatory Visit: Payer: Self-pay

## 2018-03-05 ENCOUNTER — Encounter (HOSPITAL_COMMUNITY): Payer: Self-pay | Admitting: *Deleted

## 2018-03-05 DIAGNOSIS — IMO0001 Reserved for inherently not codable concepts without codable children: Secondary | ICD-10-CM | POA: Diagnosis present

## 2018-03-05 DIAGNOSIS — I700 Atherosclerosis of aorta: Secondary | ICD-10-CM | POA: Diagnosis not present

## 2018-03-05 DIAGNOSIS — E1022 Type 1 diabetes mellitus with diabetic chronic kidney disease: Secondary | ICD-10-CM | POA: Diagnosis not present

## 2018-03-05 DIAGNOSIS — E785 Hyperlipidemia, unspecified: Secondary | ICD-10-CM | POA: Diagnosis not present

## 2018-03-05 DIAGNOSIS — R079 Chest pain, unspecified: Secondary | ICD-10-CM | POA: Diagnosis present

## 2018-03-05 DIAGNOSIS — E1129 Type 2 diabetes mellitus with other diabetic kidney complication: Secondary | ICD-10-CM

## 2018-03-05 DIAGNOSIS — I2510 Atherosclerotic heart disease of native coronary artery without angina pectoris: Secondary | ICD-10-CM | POA: Diagnosis present

## 2018-03-05 DIAGNOSIS — I130 Hypertensive heart and chronic kidney disease with heart failure and stage 1 through stage 4 chronic kidney disease, or unspecified chronic kidney disease: Secondary | ICD-10-CM | POA: Diagnosis not present

## 2018-03-05 DIAGNOSIS — I5032 Chronic diastolic (congestive) heart failure: Secondary | ICD-10-CM | POA: Diagnosis present

## 2018-03-05 DIAGNOSIS — Z881 Allergy status to other antibiotic agents status: Secondary | ICD-10-CM | POA: Diagnosis not present

## 2018-03-05 DIAGNOSIS — Z91018 Allergy to other foods: Secondary | ICD-10-CM | POA: Insufficient documentation

## 2018-03-05 DIAGNOSIS — F329 Major depressive disorder, single episode, unspecified: Secondary | ICD-10-CM | POA: Insufficient documentation

## 2018-03-05 DIAGNOSIS — D509 Iron deficiency anemia, unspecified: Secondary | ICD-10-CM | POA: Diagnosis not present

## 2018-03-05 DIAGNOSIS — Z794 Long term (current) use of insulin: Secondary | ICD-10-CM | POA: Insufficient documentation

## 2018-03-05 DIAGNOSIS — I25119 Atherosclerotic heart disease of native coronary artery with unspecified angina pectoris: Secondary | ICD-10-CM | POA: Diagnosis not present

## 2018-03-05 DIAGNOSIS — I10 Essential (primary) hypertension: Secondary | ICD-10-CM | POA: Diagnosis present

## 2018-03-05 DIAGNOSIS — Z8674 Personal history of sudden cardiac arrest: Secondary | ICD-10-CM | POA: Insufficient documentation

## 2018-03-05 DIAGNOSIS — Z9889 Other specified postprocedural states: Secondary | ICD-10-CM | POA: Insufficient documentation

## 2018-03-05 DIAGNOSIS — N183 Chronic kidney disease, stage 3 unspecified: Secondary | ICD-10-CM | POA: Diagnosis present

## 2018-03-05 DIAGNOSIS — J441 Chronic obstructive pulmonary disease with (acute) exacerbation: Secondary | ICD-10-CM | POA: Diagnosis not present

## 2018-03-05 DIAGNOSIS — F419 Anxiety disorder, unspecified: Secondary | ICD-10-CM | POA: Diagnosis not present

## 2018-03-05 DIAGNOSIS — I5042 Chronic combined systolic (congestive) and diastolic (congestive) heart failure: Secondary | ICD-10-CM | POA: Diagnosis not present

## 2018-03-05 DIAGNOSIS — Z96651 Presence of right artificial knee joint: Secondary | ICD-10-CM | POA: Insufficient documentation

## 2018-03-05 DIAGNOSIS — Z7982 Long term (current) use of aspirin: Secondary | ICD-10-CM | POA: Diagnosis not present

## 2018-03-05 DIAGNOSIS — Z91012 Allergy to eggs: Secondary | ICD-10-CM | POA: Diagnosis not present

## 2018-03-05 DIAGNOSIS — E039 Hypothyroidism, unspecified: Secondary | ICD-10-CM | POA: Diagnosis not present

## 2018-03-05 DIAGNOSIS — Z955 Presence of coronary angioplasty implant and graft: Secondary | ICD-10-CM | POA: Insufficient documentation

## 2018-03-05 DIAGNOSIS — G4730 Sleep apnea, unspecified: Secondary | ICD-10-CM | POA: Insufficient documentation

## 2018-03-05 DIAGNOSIS — I252 Old myocardial infarction: Secondary | ICD-10-CM | POA: Diagnosis not present

## 2018-03-05 DIAGNOSIS — N179 Acute kidney failure, unspecified: Secondary | ICD-10-CM | POA: Insufficient documentation

## 2018-03-05 DIAGNOSIS — F32A Depression, unspecified: Secondary | ICD-10-CM | POA: Diagnosis present

## 2018-03-05 DIAGNOSIS — Z79899 Other long term (current) drug therapy: Secondary | ICD-10-CM | POA: Diagnosis not present

## 2018-03-05 DIAGNOSIS — B350 Tinea barbae and tinea capitis: Secondary | ICD-10-CM | POA: Diagnosis not present

## 2018-03-05 DIAGNOSIS — R569 Unspecified convulsions: Secondary | ICD-10-CM | POA: Insufficient documentation

## 2018-03-05 DIAGNOSIS — Z8249 Family history of ischemic heart disease and other diseases of the circulatory system: Secondary | ICD-10-CM | POA: Insufficient documentation

## 2018-03-05 LAB — CBC
HCT: 34.2 % — ABNORMAL LOW (ref 36.0–46.0)
Hemoglobin: 10.7 g/dL — ABNORMAL LOW (ref 12.0–15.0)
MCH: 23.9 pg — ABNORMAL LOW (ref 26.0–34.0)
MCHC: 31.3 g/dL (ref 30.0–36.0)
MCV: 76.3 fL — ABNORMAL LOW (ref 78.0–100.0)
Platelets: 289 10*3/uL (ref 150–400)
RBC: 4.48 MIL/uL (ref 3.87–5.11)
RDW: 21.8 % — ABNORMAL HIGH (ref 11.5–15.5)
WBC: 11.6 10*3/uL — ABNORMAL HIGH (ref 4.0–10.5)

## 2018-03-05 LAB — BASIC METABOLIC PANEL
Anion gap: 14 (ref 5–15)
BUN: 35 mg/dL — ABNORMAL HIGH (ref 8–23)
CO2: 26 mmol/L (ref 22–32)
Calcium: 9.5 mg/dL (ref 8.9–10.3)
Chloride: 100 mmol/L (ref 98–111)
Creatinine, Ser: 3 mg/dL — ABNORMAL HIGH (ref 0.44–1.00)
GFR calc Af Amer: 18 mL/min — ABNORMAL LOW (ref >60)
GFR calc non Af Amer: 16 mL/min — ABNORMAL LOW (ref >60)
Glucose, Bld: 120 mg/dL — ABNORMAL HIGH (ref 70–99)
Potassium: 3.7 mmol/L (ref 3.5–5.1)
Sodium: 140 mmol/L (ref 135–145)

## 2018-03-05 LAB — I-STAT TROPONIN, ED: Troponin i, poc: 0 ng/mL (ref 0.00–0.08)

## 2018-03-05 MED ORDER — ALBUTEROL SULFATE (2.5 MG/3ML) 0.083% IN NEBU
5.0000 mg | INHALATION_SOLUTION | Freq: Once | RESPIRATORY_TRACT | Status: AC
Start: 2018-03-05 — End: 2018-03-05
  Administered 2018-03-05: 5 mg via RESPIRATORY_TRACT
  Filled 2018-03-05: qty 6

## 2018-03-05 MED ORDER — SODIUM CHLORIDE 0.9 % IV SOLN
Freq: Once | INTRAVENOUS | Status: AC
  Administered 2018-03-06: 01:00:00 via INTRAVENOUS

## 2018-03-05 MED ORDER — MORPHINE SULFATE (PF) 4 MG/ML IV SOLN
4.0000 mg | Freq: Once | INTRAVENOUS | Status: AC
  Administered 2018-03-06: 4 mg via INTRAVENOUS
  Filled 2018-03-05: qty 1

## 2018-03-05 MED ORDER — DEXAMETHASONE SODIUM PHOSPHATE 10 MG/ML IJ SOLN: 10.0000 mg | Freq: Once | INTRAMUSCULAR | Status: DC

## 2018-03-05 NOTE — H&P (Signed)
History and Physical    Brenda Sims YBO:[id number] DOB: [birth year] DOA: 03/05/2018  Referring Sims/NP/PA:   PCP: Brenda Rakes, Sims   Patient coming from:  The patient is coming from home.  At baseline, pt is independent for most of ADL.       Chief Complaint: chest pain, cough, SOB and wheezing  HPI: Brenda Sims is a 62 y.o. female with medical history significant of hypertension, hyperlipidemia, diabetes mellitus, depression, ICD, CKD-III, seizure, CHF with EF 50%, recent cardiac arrest, who presents with chest pain, cough, SOB and wheezing.  Pt states that she started having chest pain last night, which is located in the front chest, constant, 10 out of 10 severity, sharp, radiating to the neck.  It is pleuritic and aggravated by deep breath.  She has chest wall tenderness on palpation.  No recent long distance traveling.  No tenderness in the calf areas.  Patient also has cough, shortness of breath and wheezing.  She coughs up white mucus. No fever or chills. She states that she has lower back pain, radiating down to her left leg posteriorly. no loss control of bilateral bowel movement. She also reports bilateral hand tingling.  No symptoms of UTI or unilateral weakness. Pt states that her cardiologist dr. Sallyanne Sims increased her Lasix to dose recently 7/15. Currently she is taking Lasix 80 mg in morning and 40 mg in the evening.  Cardiac cath on 09/11/17: 1. Moderate nonobstructive CAD 2. Moderate LV dysfunction. 3. Mildly elevated LVEDP  ED Course: pt was found to have WBC 11.6, negative troponin, worsening renal function, temperature normal, no tachycardia, oxygen saturation 92% on room air, chest x-ray showed bilateral basilar interstitial reticular opacity.  Patient is placed on telemetry bed of observation.  Review of Systems:   General: no fevers, chills, no body weight gain,  has fatigue HEENT: no blurry vision, hearing changes or sore throat Respiratory: has dyspnea, coughing,  wheezing CV: has chest pain, no palpitations GI: no nausea, vomiting, abdominal pain, diarrhea, constipation GU: no dysuria, burning on urination, increased urinary frequency, hematuria  Ext: no leg edema Neuro: no unilateral weakness, numbness, or tingling, no vision change or hearing loss Skin: no rash, no skin tear. MSK: No muscle spasm, no deformity, no limitation of range of movement in spin Heme: No easy bruising.  Travel history: No recent long distant travel.  Allergy:  Allergies  Allergen Reactions  . Eggs Or Egg-Derived Products Shortness Of Breath and Rash  . Azithromycin Swelling    Facial/throat swelling  . Cherry Swelling    Facial swelling    Past Medical History:  Diagnosis Date  . Anemia   . Anxiety   . CAD (coronary artery disease)   . CHF (congestive heart failure) ([place])   . CKD (chronic kidney disease)   . COPD (chronic obstructive pulmonary disease) ([place])   . Depression   . Diabetes mellitus without complication ([place])   . Hypertension   . Hypothyroid   . MI (myocardial infarction) ([place])   . Sleep apnea    no cpap , "never gave me one"    Past Surgical History:  Procedure Laterality Date  . APPENDECTOMY    . BACK SURGERY    . LEFT HEART CATH AND CORONARY ANGIOGRAPHY N/A 09/11/2017   Procedure: LEFT HEART CATH AND CORONARY ANGIOGRAPHY;  Surgeon: Martinique, Peter Sims, Sims;  Location: [facility];  Service: Cardiovascular;  Laterality: N/A;  . TOTAL KNEE ARTHROPLASTY Right 2007    Social History:  reports that she has never smoked. She has never used smokeless tobacco. She reports that she does not drink alcohol or use drugs.  Family History:  Family History  Problem Relation Age of Onset  . Hypertension Mother   . Colon cancer Neg Hx   . Esophageal cancer Neg Hx      Prior to Admission medications   Medication Sig Start Date End Date Taking? Authorizing Provider  amLODipine (NORVASC) 10 MG tablet Take 1 tablet (10 mg total) by mouth daily.  01/21/18  Yes Brenda Rakes, Sims  aspirin EC 81 MG tablet Take 1 tablet (81 mg total) by mouth daily. 10/16/17  Yes Brenda Dawley, Tiffany S, PA-C  busPIRone (BUSPAR) 5 MG tablet Take 1 tablet (5 mg total) by mouth 3 (three) times daily. 01/21/18  Yes Brenda Sims  carvedilol (COREG) 25 MG tablet TAKE 1 TABLET (25 MG TOTAL) BY MOUTH 2 (TWO) TIMES DAILY WITH A MEAL. 02/25/18  Yes Brenda Rakes, Sims  cetirizine (ZYRTEC) 10 MG tablet Take 1 tablet (10 mg total) by mouth daily. 01/21/18  Yes Brenda Rakes, Sims  FLUoxetine (PROZAC) 20 MG capsule Take 1 capsule (20 mg total) by mouth daily. 01/21/18  Yes Brenda Rakes, Sims  furosemide (LASIX) 40 MG tablet Take 2 tablets (80 mg total) by mouth every morning AND 1 tablet (40 mg total) every evening. 02/24/18 02/19/19 Yes Croitoru, Mihai, Sims  gabapentin (NEURONTIN) 300 MG capsule Take 1 capsule (300 mg total) by mouth 3 (three) times daily. 01/21/18  Yes Brenda Rakes, Sims  hydrALAZINE (APRESOLINE) 100 MG tablet Take 1 tablet (100 mg total) by mouth 3 (three) times daily. 01/21/18  Yes Brenda Sims  insulin aspart (NOVOLOG) 100 UNIT/ML injection Inject 4 Units into the skin 3 (three) times daily with meals. 01/21/18  Yes Brenda Rakes, Sims  Insulin Glargine (LANTUS SOLOSTAR) 100 UNIT/ML Solostar Pen Inject 20 Units into the skin 2 (two) times daily. Patient taking differently: Inject 20-80 Units into the skin See admin instructions. Use 80 units every morning then use 20 units at night 01/21/18  Yes Newlin, Enobong, Sims  losartan (COZAAR) 100 MG tablet Take 1 tablet (100 mg total) by mouth daily. 01/21/18  Yes Brenda Sims  montelukast (SINGULAIR) 10 MG tablet Take 1 tablet (10 mg total) by mouth at bedtime. 01/21/18  Yes Brenda Rakes, Sims  potassium chloride SA (K-DUR,KLOR-CON) 10 MEQ tablet Take 1 tablet (10 mEq total) by mouth daily. 01/21/18  Yes Brenda Rakes, Sims  rosuvastatin (CRESTOR) 40 MG tablet Take 1 tablet (40 mg total) by mouth daily. 01/21/18  04/21/18 Yes Brenda Rakes, Sims  terbinafine (LAMISIL) 250 MG tablet Take 1 tablet (250 mg total) by mouth daily. For scalp infection 02/17/18 03/19/18 Yes Brenda Gottron, NP  ACCU-CHEK SOFTCLIX LANCETS lancets Use as instructed 10/16/17   Brayton Caves, PA-C  Blood Glucose Monitoring Suppl (ACCU-CHEK AVIVA) device Use as instructed 10/16/17 10/16/18  Brayton Caves, PA-C  Blood Glucose Monitoring Suppl (TRUE METRIX METER) DEVI 1 kit by Does not apply route 4 (four) times daily. 10/16/17   Brayton Caves, PA-C  glucose blood (ACCU-CHEK AVIVA) test strip Use as instructed 10/16/17   Brayton Caves, PA-C  glucose blood (TRUE METRIX BLOOD GLUCOSE TEST) test strip Use as instructed 10/16/17   Brayton Caves, PA-C  guaiFENesin-dextromethorphan (ROBITUSSIN DM) 100-10 MG/5ML syrup Take 5 mLs by mouth every 4 (four) hours as needed for cough. Patient not taking: Reported on 03/05/2018 10/08/17  Mikhail, Velta Addison, DO  Insulin Pen Needle (TRUEPLUS PEN NEEDLES) 31G X 6 MM MISC Use as directed 11/08/17   Brenda Rakes, Sims  ketoconazole (NIZORAL) 2 % shampoo Daily for the next week then twice a week until resolution Patient not taking: Reported on 03/05/2018 02/17/18   Brenda Gottron, NP  spironolactone (ALDACTONE) 50 MG tablet Take 1 tablet (50 mg total) by mouth daily. Patient not taking: Reported on 03/05/2018 01/21/18   Brenda Rakes, Sims  TRUEPLUS LANCETS 28G MISC 28 g by Does not apply route 4 (four) times daily. 10/16/17   Brayton Caves, PA-C    Physical Exam: Vitals:   03/05/18 2300 03/06/18 0015 03/06/18 0116 03/06/18 0137  BP: (!) 97/39 119/65 123/61   Pulse: 74 76 77   Resp: '18 15 20   ' Temp:   98 F (36.7 C)   TempSrc:   Oral   SpO2: 95% 92% 93%   Weight:    84 kg (185 lb 1.6 oz)  Height:    5' 4.5" (1.638 Sims)   General: Not in acute distress HEENT:       Eyes: PERRL, EOMI, no scleral icterus.       ENT: No discharge from the ears and nose, no pharynx injection, no tonsillar enlargement.         Neck: No JVD, no bruit, no mass felt. Heme: No neck lymph node enlargement. Cardiac: S1/S2, RRR, No murmurs, No gallops or rubs. Respiratory: Has wheezing bilaterally  Chest wall: has reproducible chest wall tenderness on palpation. GI: Soft, nondistended, nontender, no rebound pain, no organomegaly, BS present. GU: No hematuria Ext: has trace leg edema bilaterally. 2+DP/PT pulse bilaterally. Musculoskeletal: No joint deformities, No joint redness or warmth, no limitation of ROM in spin. Skin: No rashes.  Neuro: Alert, oriented X3, cranial nerves II-XII grossly intact, moves all extremities normally.  , sensation to light touch intact. Brachial reflex 2+ bilaterally. Knee reflex 1+ bilaterally. Negative Babinski'Sims sign. Normal finger to nose test. Psych: Patient is not psychotic, no suicidal or hemocidal ideation.  Labs on Admission: I have personally reviewed following labs and imaging studies  CBC: Recent Labs  Lab 03/05/18 2149  WBC 11.6*  HGB 10.7*  HCT 34.2*  MCV 76.3*  PLT 945   Basic Metabolic Panel: Recent Labs  Lab 03/05/18 2149  NA 140  K 3.7  CL 100  CO2 26  GLUCOSE 120*  BUN 35*  CREATININE 3.00*  CALCIUM 9.5   GFR: Estimated Creatinine Clearance: 20.6 mL/min (A) (by C-G formula based on SCr of 3 mg/dL (H)). Liver Function Tests: No results for input(Sims): AST, ALT, ALKPHOS, BILITOT, PROT, ALBUMIN in the last 168 hours. No results for input(Sims): LIPASE, AMYLASE in the last 168 hours. No results for input(Sims): AMMONIA in the last 168 hours. Coagulation Profile: No results for input(Sims): INR, PROTIME in the last 168 hours. Cardiac Enzymes: Recent Labs  Lab 03/06/18 0046  TROPONINI <0.03   BNP (last 3 results) No results for input(Sims): PROBNP in the last 8760 hours. HbA1C: Recent Labs    03/06/18 0020  HGBA1C 6.2*   CBG: Recent Labs  Lab 03/06/18 0122  GLUCAP 95   Lipid Profile: No results for input(Sims): CHOL, HDL, LDLCALC, TRIG, CHOLHDL,  LDLDIRECT in the last 72 hours. Thyroid Function Tests: No results for input(Sims): TSH, T4TOTAL, FREET4, T3FREE, THYROIDAB in the last 72 hours. Anemia Panel: No results for input(Sims): VITAMINB12, FOLATE, FERRITIN, TIBC, IRON, RETICCTPCT in the last 72 hours. Urine  analysis:    Component Value Date/Time   COLORURINE YELLOW 08/30/2017 0159   APPEARANCEUR CLEAR 08/30/2017 0159   LABSPEC 1.010 08/30/2017 0159   PHURINE 5.0 08/30/2017 0159   GLUCOSEU 50 (A) 08/30/2017 0159   HGBUR NEGATIVE 08/30/2017 0159   BILIRUBINUR NEGATIVE 08/30/2017 0159   KETONESUR NEGATIVE 08/30/2017 0159   PROTEINUR 100 (A) 08/30/2017 0159   NITRITE NEGATIVE 08/30/2017 0159   LEUKOCYTESUR NEGATIVE 08/30/2017 0159   Sepsis Labs: '@LABRCNTIP' (procalcitonin:4,lacticidven:4) )No results found for this or any previous visit (from the past 240 hour(Sims)).   Radiological Exams on Admission: Dg Chest 2 View  Result Date: 03/05/2018 CLINICAL DATA:  Chest pain. EXAM: CHEST - 2 VIEW COMPARISON:  Radiograph of October 22, 2017. FINDINGS: Stable cardiomediastinal silhouette. No pneumothorax or pleural effusion is noted. Atherosclerosis of thoracic aorta is noted. Stable bibasilar interstitial and reticular densities are noted most consistent with scarring or chronic interstitial lung disease. Acute superimposed inflammation or edema cannot be excluded. Bony thorax is unremarkable. IMPRESSION: Stable bibasilar interstitial and reticular densities are noted most consistent with scarring or chronic interstitial lung disease, but acute superimposed edema or inflammation cannot be excluded. Aortic Atherosclerosis (ICD10-I70.0). Electronically Signed   By: Marijo Conception, Sims.D.   On: 03/05/2018 22:06     EKG: Independently reviewed.  Sinus rhythm, QTC 466, LAD, LAE, poor R wave progression, mild T wave inversion in V4-V6 in the inferior leads.  Assessment/Plan Principal Problem:   Chest pain Active Problems:   Essential hypertension    Type II diabetes mellitus with renal manifestations (HCC)   Chronic combined systolic and diastolic CHF (congestive heart failure) (HCC)   Acute renal failure superimposed on stage 3 chronic kidney disease (HCC)   HLD (hyperlipidemia)   Anxiety and depression   Iron deficiency anemia   COPD exacerbation (HCC)   CAD (coronary artery disease)   Chest pain and hx of CAD: pt had cardiac cath on 09/11/17, which showed moderate nonobstructive CAD. She has pleuritic chest pain, but no recent long distant traveling, no signs of DVT, low suspicion for PE.  Likely due to musculoskeletal pain or costochondritis given chest wall tenderness on palpation.  Troponin negative.  - will place on Tele bed for obs - cycle CE q6 x3 and repeat EKG in the am  - prn Nitroglycerin, Morphine, and aspirin, Crestor and coreg  - Risk factor stratification: will check FLP and A1C  - did not order 2d echo--> please reevaluate pt n AM to decide if pt needs 2D echo.  HTN:  -Continue home medications: Hydralazine, amlodipine, Coreg -Hold Cozaar and Lasix due to worsening renal function -IV hydralazine prn  COPD exacerbation: pt has cough, shortness of breath and wheezing, consistent with COPD exacerbation.  Chest x-ray showed bilateral basilar interstitial reticular opacity, no obvious infiltration. -Nebulizers: scheduled Duoneb nebs and prn albuterol Nebs -Solu-Medrol 60 mg IV tid -Doxycycline orally -continue Singulair -Mucinex for cough  -Urine Sims. pneumococcal antigen -Follow up blood culture x2, sputum culture, respiratory virus panel  Chronic combined systolic and diastolic CHF: 2-D Echo on 02/16/18 showed EF of 50 percent.  Patient has trace leg edema, but no JVD no pulmonary edema chest x-ray.  CHF seems to be compensated. -hold Lasix due to worsening renal functionld  -Check BNP  HLD: -Crestor  Type II diabetes mellitus with renal manifestations ([place]): Last A1c 6.4 on 10/20/17, controled. Patient is  taking novolog and Lantus at home -will decrease Lantus dose from 80-20 to 40-10 U bid -SSI  AoCKD-III: stable. Baseline creatinine 1.2-1.3, her creatinine is 3.0 and BUN 35. Likely due to dehydration and continuation of ARB, diuretics - gentle IVF: NS 50 cc - Follow up renal function by BMP - Check FeUrea - Hold Cozarr and lasix  Anemia: Hemoglobin stable, 10.7. -Follow-up by CBC  Anxiety and depression: -PRN Xanax -Prozac, BuSpar  Back pain with sciatica: -continue Neurontin, PRN Tylenol  DVT ppx: SQ Lovenox Code Status: Full code Family Communication:  Yes, patient'Sims fianc    at bed side Disposition Plan:  Anticipate discharge back to previous home environment Consults called:  none Admission status: Obs / tele     Date of Service 03/06/2018    Ivor Costa [facility] Pager [phone]  If 7PM-7AM, please contact night-coverage [email or web address] Password [email or web address] 03/06/2018, 2:22 AM

## 2018-03-05 NOTE — ED Triage Notes (Signed)
Pt started having chest pain last night, took five of her home nitro pills with improvement. Started having chest pain from throat down center of chest and into back tonight with SOB. Has not taken any nitro or ASA tonight.

## 2018-03-05 NOTE — ED Provider Notes (Signed)
[facility] EMERGENCY DEPARTMENT Provider Note   CSN: [id number] Arrival date & time: 03/05/18  2125     History   Chief Complaint Chief Complaint  Patient presents with  . Chest Pain    HPI Brenda Sims is a 62 y.o. female.  Patient with a complicated medical history including HTN, HLD, CHF (EF 45%, March, 2019), CKD (stage 3), DM, cardiac arrest 08/2017 secondary to hypoxic respiratory failure, nonobstructive cardiac cath at that time, who presents tonight with pain that started around 10:30 03/04/18. She describes the pain as starting in her bilateral jaw, bilateral neck going to chest and thru to back. It was sudden in onset and has been constant in nature. She took a NTG shortly after onset of symptoms and obtained relief for a brief period before symptoms returned. They have been constant since and associated with SOB, cough, chest tightness, wheezing, nausea and diaphoresis. No fever but she reports chills. She notes her cough has been present for 2 weeks. During a follow up visit with her cardiologist last week, her Lasix was increased to 80 mg am, 40 mg pm but her cough persists. She denies vomiting, abdominal pain, diarrhea, melena, hematochezia. She reports recent history of anemia requiring transfusion (during January admission) with unknown source of blood loss. She is scheduled for colonoscopy next week.   The history is provided by the patient. No language interpreter was used.  Chest Pain   Associated symptoms include cough, diaphoresis, dizziness, nausea, palpitations, shortness of breath and weakness. Pertinent negatives include no abdominal pain, no fever and no vomiting.    Past Medical History:  Diagnosis Date  . Anemia   . Anxiety   . CAD (coronary artery disease)   . CHF (congestive heart failure) ([place])   . CKD (chronic kidney disease)   . COPD (chronic obstructive pulmonary disease) ([place])   . Depression   . Diabetes mellitus without  complication ([place])   . Hypertension   . Hypothyroid   . MI (myocardial infarction) ([place])   . Sleep apnea    no cpap , "never gave me one"    Patient Active Problem List   Diagnosis Date Noted  . Iron deficiency anemia 02/26/2018  . Sleep apnea 11/01/2017  . HLD (hyperlipidemia) 10/19/2017  . Anxiety and depression 10/19/2017  . Chest pain 10/19/2017  . Hypoglycemia   . Hypoxia   . Bronchitis 10/07/2017  . Chronic combined systolic and diastolic CHF (congestive heart failure) ([place]) 10/07/2017  . CKD (chronic kidney disease) stage 3, GFR 30-59 ml/min (H[facility]) 10/07/2017  . Hypokalemia 10/07/2017  . Normocytic normochromic anemia 10/07/2017  . Type II diabetes mellitus with renal manifestations ([place])   . Leukocytosis   . Acute blood loss anemia   . Seizures ([place])   . Coronary artery disease involving native coronary artery of native heart with angina pectoris ([place]) 09/05/2017  . History of cardiac arrest 09/03/2017  . Cardiopulmonary arrest ([place])   . Acute encephalopathy   . Acute respiratory failure with hypoxia ([place])   . Acute on chronic combined systolic and diastolic CHF (congestive heart failure) ([place]) 08/29/2017  . Essential hypertension 08/29/2017  . Type 1 diabetes mellitus without complication ([place]) 99/24/2683  . Tobacco abuse 08/29/2017  . Acute pulmonary edema (H[facility])   . CKD (chronic kidney disease)   . Dyspnea on exertion     Past Surgical History:  Procedure Laterality Date  . APPENDECTOMY    . BACK SURGERY    . LEFT  HEART CATH AND CORONARY ANGIOGRAPHY N/A 09/11/2017   Procedure: LEFT HEART CATH AND CORONARY ANGIOGRAPHY;  Surgeon: Martinique, Peter M, MD;  Location: [place] [facility];  Service: Cardiovascular;  Laterality: N/A;  . TOTAL KNEE ARTHROPLASTY Right 2007     OB History   None      Home Medications    Prior to Admission medications   Medication Sig Start Date End Date Taking? Authorizing Provider  ACCU-CHEK SOFTCLIX LANCETS lancets Use as  instructed 10/16/17   Brayton Caves, PA-C  acetaminophen (TYLENOL) 500 MG tablet Take 1,000 mg by mouth every 6 (six) hours as needed for headache (pain).    [provider]  amLODipine (NORVASC) 10 MG tablet Take 1 tablet (10 mg total) by mouth daily. 01/21/18   Charlott Rakes, MD  aspirin EC 81 MG tablet Take 1 tablet (81 mg total) by mouth daily. 10/16/17   Brayton Caves, PA-C  Blood Glucose Monitoring Suppl (ACCU-CHEK AVIVA) device Use as instructed 10/16/17 10/16/18  Brayton Caves, PA-C  Blood Glucose Monitoring Suppl (TRUE METRIX METER) DEVI 1 kit by Does not apply route 4 (four) times daily. 10/16/17   Brayton Caves, PA-C  busPIRone (BUSPAR) 5 MG tablet Take 1 tablet (5 mg total) by mouth 3 (three) times daily. 01/21/18   Charlott Rakes, MD  carvedilol (COREG) 25 MG tablet TAKE 1 TABLET (25 MG TOTAL) BY MOUTH 2 (TWO) TIMES DAILY WITH A MEAL. 02/25/18   Charlott Rakes, MD  cetirizine (ZYRTEC) 10 MG tablet Take 1 tablet (10 mg total) by mouth daily. 01/21/18   Charlott Rakes, MD  FLUoxetine (PROZAC) 20 MG capsule Take 1 capsule (20 mg total) by mouth daily. 01/21/18   Charlott Rakes, MD  furosemide (LASIX) 40 MG tablet Take 2 tablets (80 mg total) by mouth every morning AND 1 tablet (40 mg total) every evening. 02/24/18 02/19/19  Croitoru, Mihai, MD  gabapentin (NEURONTIN) 300 MG capsule Take 1 capsule (300 mg total) by mouth 3 (three) times daily. 01/21/18   Charlott Rakes, MD  glucose blood (ACCU-CHEK AVIVA) test strip Use as instructed 10/16/17   Brayton Caves, PA-C  glucose blood (TRUE METRIX BLOOD GLUCOSE TEST) test strip Use as instructed 10/16/17   Brayton Caves, PA-C  guaiFENesin-dextromethorphan (ROBITUSSIN DM) 100-10 MG/5ML syrup Take 5 mLs by mouth every 4 (four) hours as needed for cough. 10/08/17   Mikhail, Velta Addison, DO  hydrALAZINE (APRESOLINE) 100 MG tablet Take 1 tablet (100 mg total) by mouth 3 (three) times daily. 01/21/18   Charlott Rakes, MD  insulin aspart (NOVOLOG) 100  UNIT/ML injection Inject 4 Units into the skin 3 (three) times daily with meals. 01/21/18   Charlott Rakes, MD  Insulin Glargine (LANTUS SOLOSTAR) 100 UNIT/ML Solostar Pen Inject 20 Units into the skin 2 (two) times daily. 01/21/18   Charlott Rakes, MD  Insulin Pen Needle (TRUEPLUS PEN NEEDLES) 31G X 6 MM MISC Use as directed 11/08/17   Charlott Rakes, MD  ketoconazole (NIZORAL) 2 % shampoo Daily for the next week then twice a week until resolution 02/17/18   Augusto Gamble B, NP  losartan (COZAAR) 100 MG tablet Take 1 tablet (100 mg total) by mouth daily. 01/21/18   Charlott Rakes, MD  montelukast (SINGULAIR) 10 MG tablet Take 1 tablet (10 mg total) by mouth at bedtime. 01/21/18   Charlott Rakes, MD  potassium chloride SA (K-DUR,KLOR-CON) 10 MEQ tablet Take 1 tablet (10 mEq total) by mouth daily. 01/21/18   Newlin, Charlane Ferretti,  MD  rosuvastatin (CRESTOR) 40 MG tablet Take 1 tablet (40 mg total) by mouth daily. 01/21/18 04/21/18  Charlott Rakes, MD  spironolactone (ALDACTONE) 50 MG tablet Take 1 tablet (50 mg total) by mouth daily. 01/21/18   Charlott Rakes, MD  terbinafine (LAMISIL) 250 MG tablet Take 1 tablet (250 mg total) by mouth daily. For scalp infection 02/17/18 03/19/18  Zigmund Gottron, NP  traMADol (ULTRAM) 50 MG tablet Take 50 mg by mouth every 8 (eight) hours as needed for pain. 11/12/17   [provider]  TRUEPLUS LANCETS 28G MISC 28 g by Does not apply route 4 (four) times daily. 10/16/17   Brayton Caves, PA-C    Family History Family History  Problem Relation Age of Onset  . Hypertension Mother   . Colon cancer Neg Hx   . Esophageal cancer Neg Hx     Social History Social History   Tobacco Use  . Smoking status: Never Smoker  . Smokeless tobacco: Never Used  Substance Use Topics  . Alcohol use: No    Frequency: Never  . Drug use: No     Allergies   Eggs or egg-derived products; Azithromycin; and Cherry   Review of Systems Review of Systems  Constitutional: Positive  for diaphoresis and fatigue. Negative for chills and fever.  HENT: Negative.   Respiratory: Positive for cough, chest tightness, shortness of breath and wheezing.   Cardiovascular: Positive for chest pain and palpitations. Negative for leg swelling.  Gastrointestinal: Positive for nausea. Negative for abdominal pain, diarrhea and vomiting.  Musculoskeletal: Negative.  Negative for myalgias.  Skin: Negative.   Neurological: Positive for dizziness and weakness.     Physical Exam Updated Vital Signs BP (!) 109/53   Pulse 81   Temp 98.3 F (36.8 C) (Oral)   Resp 18   SpO2 100%   Physical Exam  Constitutional: She is oriented to person, place, and time. She appears well-developed and well-nourished.  HENT:  Head: Normocephalic.  Eyes:  No conjunctival pallor   Neck: Normal range of motion. Neck supple. Carotid bruit is not present.  Cardiovascular: Normal rate and regular rhythm.  Pulmonary/Chest: Effort normal. No respiratory distress. She has wheezes.  Abdominal: Soft. Bowel sounds are normal. There is no tenderness. There is no rebound and no guarding.  Musculoskeletal: Normal range of motion. She exhibits no edema.  Neurological: She is alert and oriented to person, place, and time.  Skin: Skin is warm and dry. No rash noted.  Psychiatric: She has a normal mood and affect.     ED Treatments / Results  Labs (all labs ordered are listed, but only abnormal results are displayed) Labs Reviewed  BASIC METABOLIC PANEL  CBC  I-STAT TROPONIN, ED    EKG None  Radiology Dg Chest 2 View  Result Date: 03/05/2018 CLINICAL DATA:  Chest pain. EXAM: CHEST - 2 VIEW COMPARISON:  Radiograph of October 22, 2017. FINDINGS: Stable cardiomediastinal silhouette. No pneumothorax or pleural effusion is noted. Atherosclerosis of thoracic aorta is noted. Stable bibasilar interstitial and reticular densities are noted most consistent with scarring or chronic interstitial lung disease. Acute  superimposed inflammation or edema cannot be excluded. Bony thorax is unremarkable. IMPRESSION: Stable bibasilar interstitial and reticular densities are noted most consistent with scarring or chronic interstitial lung disease, but acute superimposed edema or inflammation cannot be excluded. Aortic Atherosclerosis (ICD10-I70.0). Electronically Signed   By: Marijo Conception, M.D.   On: 03/05/2018 22:06    Procedures Procedures (including critical  care time)  Medications Ordered in ED Medications - No data to display   Initial Impression / Assessment and Plan / ED Course  I have reviewed the triage vital signs and the nursing notes.  Pertinent labs & imaging results that were available during my care of the patient were reviewed by me and considered in my medical decision making (see chart for details).  Clinical Course as of Mar 05 2240  Wed Mar 05, 2018  2216 ED EKG within 10 minutes [MB]  2235 ED EKG within 10 minutes [MB]    Clinical Course User Index [MB] Chapman Moss A, Student-PA    Patient presents with CP/tightness, SOB, wheezing since last night. She feels dizzy and weak. No fever. Complicated medical history.   DDx  - CHF exacerbation vs COPD vs CAD vs symptomatic anemia.  On arrival, EKG done and shows no changes from previous. Initial troponin 0.00. Chest pain does not follow a cardiac pattern. Doubt ACS. CXR does not show significant edema and there is no LE edema. Recent increase to Lasix. BNP pending but doubt acute CHF. She is slightly hypotensive, without tachycardia, appears well perfused. Doubt anemia. Labs resulted and hgb 10.7.  She is wheezing on arrival. Her oxygen saturation is 89% on the monitor. Oxygen started. Albuterol neb provided with little improvement. She continues to have pain - morphine ordered. IV Decadron provided.   She appears dehydrated with elevated Cr over baseline in the setting of recently increased Lasix. Will infuse saline at 125cc/hr to  gently rehydrate.   Feel with her medical history, respiratory symptoms including mild hypoxia, and chest pain, that she would benefit from admission. Discussed with Dr. Blaine Hamper who accepts the patient onto his service. Appreciate his help with the care of this patient.   Final Clinical Impressions(s) / ED Diagnoses   Final diagnoses:  None   1. COPD exacerbation 2. Nonspecific chest pain 3. Mild hypoxia 4. AKI  ED Discharge Orders    None       Dennie Bible 03/05/18 2346    Virgel Manifold, MD 03/06/18 [phone]

## 2018-03-05 NOTE — ED Provider Notes (Signed)
EKG:  Rhythm: normal sinus Rate: 80 PR: 140 ms QRS: 82 ms QTc: 472 ms ST segments: NS ST changes Comparison:  No significant from prior     Virgel Manifold, MD 03/05/18 2336

## 2018-03-06 ENCOUNTER — Other Ambulatory Visit: Payer: Self-pay

## 2018-03-06 ENCOUNTER — Other Ambulatory Visit: Payer: Self-pay | Admitting: *Deleted

## 2018-03-06 DIAGNOSIS — N179 Acute kidney failure, unspecified: Secondary | ICD-10-CM | POA: Diagnosis not present

## 2018-03-06 DIAGNOSIS — I2510 Atherosclerotic heart disease of native coronary artery without angina pectoris: Secondary | ICD-10-CM | POA: Diagnosis not present

## 2018-03-06 DIAGNOSIS — Z79899 Other long term (current) drug therapy: Secondary | ICD-10-CM

## 2018-03-06 DIAGNOSIS — E1022 Type 1 diabetes mellitus with diabetic chronic kidney disease: Secondary | ICD-10-CM | POA: Diagnosis not present

## 2018-03-06 DIAGNOSIS — J441 Chronic obstructive pulmonary disease with (acute) exacerbation: Secondary | ICD-10-CM | POA: Diagnosis not present

## 2018-03-06 DIAGNOSIS — R079 Chest pain, unspecified: Secondary | ICD-10-CM | POA: Diagnosis not present

## 2018-03-06 DIAGNOSIS — I130 Hypertensive heart and chronic kidney disease with heart failure and stage 1 through stage 4 chronic kidney disease, or unspecified chronic kidney disease: Secondary | ICD-10-CM | POA: Diagnosis not present

## 2018-03-06 LAB — RESPIRATORY PANEL BY PCR
ADENOVIRUS-RVPPCR: NOT DETECTED
Bordetella pertussis: NOT DETECTED
CORONAVIRUS NL63-RVPPCR: NOT DETECTED
CORONAVIRUS OC43-RVPPCR: NOT DETECTED
Chlamydophila pneumoniae: NOT DETECTED
Coronavirus 229E: NOT DETECTED
Coronavirus HKU1: NOT DETECTED
INFLUENZA A-RVPPCR: NOT DETECTED
INFLUENZA B-RVPPCR: NOT DETECTED
METAPNEUMOVIRUS-RVPPCR: NOT DETECTED
MYCOPLASMA PNEUMONIAE-RVPPCR: NOT DETECTED
PARAINFLUENZA VIRUS 1-RVPPCR: NOT DETECTED
PARAINFLUENZA VIRUS 4-RVPPCR: NOT DETECTED
Parainfluenza Virus 2: NOT DETECTED
Parainfluenza Virus 3: NOT DETECTED
RESPIRATORY SYNCYTIAL VIRUS-RVPPCR: DETECTED — AB
Rhinovirus / Enterovirus: NOT DETECTED

## 2018-03-06 LAB — LIPID PANEL
CHOL/HDL RATIO: 7.9 ratio
CHOLESTEROL: 229 mg/dL — AB (ref 0–200)
HDL: 29 mg/dL — ABNORMAL LOW (ref >40)
LDL Cholesterol: 156 mg/dL — ABNORMAL HIGH (ref 0–99)
TRIGLYCERIDES: 218 mg/dL — AB (ref <150)
VLDL: 44 mg/dL — ABNORMAL HIGH (ref 0–40)

## 2018-03-06 LAB — GLUCOSE, CAPILLARY
GLUCOSE-CAPILLARY: 95 mg/dL (ref 70–99)
Glucose-Capillary: 218 mg/dL — ABNORMAL HIGH (ref 70–99)
Glucose-Capillary: 185 mg/dL — ABNORMAL HIGH (ref 70–99)
Glucose-Capillary: 446 mg/dL — ABNORMAL HIGH (ref 70–99)
Glucose-Capillary: 234 mg/dL — ABNORMAL HIGH (ref 70–99)

## 2018-03-06 LAB — RAPID URINE DRUG SCREEN, HOSP PERFORMED
Amphetamines: NOT DETECTED
BARBITURATES: NOT DETECTED
Benzodiazepines: NOT DETECTED
Cocaine: NOT DETECTED
Opiates: NOT DETECTED
TETRAHYDROCANNABINOL: NOT DETECTED

## 2018-03-06 LAB — BRAIN NATRIURETIC PEPTIDE: B Natriuretic Peptide: 32.4 pg/mL (ref 0.0–100.0)

## 2018-03-06 LAB — STREP PNEUMONIAE URINARY ANTIGEN: STREP PNEUMO URINARY ANTIGEN: NEGATIVE

## 2018-03-06 LAB — TROPONIN I
Troponin I: <0.03 ng/mL (ref <0.03)
Troponin I: <0.03 ng/mL (ref <0.03)

## 2018-03-06 LAB — HEMOGLOBIN A1C
Hgb A1c MFr Bld: 6.2 % — ABNORMAL HIGH (ref 4.8–5.6)
MEAN PLASMA GLUCOSE: 131.24 mg/dL

## 2018-03-06 LAB — CREATININE, URINE, RANDOM: Creatinine, Urine: 62.34 mg/dL

## 2018-03-06 MED ORDER — MONTELUKAST SODIUM 10 MG PO TABS
10.0000 mg | ORAL_TABLET | Freq: Every day | ORAL | Status: DC
  Administered 2018-03-06 (×2): 10 mg via ORAL
  Filled 2018-03-06 (×2): qty 1

## 2018-03-06 MED ORDER — IPRATROPIUM-ALBUTEROL 0.5-2.5 (3) MG/3ML IN SOLN
3.0000 mL | RESPIRATORY_TRACT | Status: DC
  Administered 2018-03-06 (×2): 3 mL via RESPIRATORY_TRACT
  Filled 2018-03-06 (×2): qty 3

## 2018-03-06 MED ORDER — BUSPIRONE HCL 10 MG PO TABS
5.0000 mg | ORAL_TABLET | Freq: Three times a day (TID) | ORAL | Status: DC
  Administered 2018-03-06 – 2018-03-07 (×4): 5 mg via ORAL
  Filled 2018-03-06 (×4): qty 1

## 2018-03-06 MED ORDER — ZOLPIDEM TARTRATE 5 MG PO TABS
5.0000 mg | ORAL_TABLET | Freq: Every evening | ORAL | Status: DC | PRN
  Administered 2018-03-06 (×2): 5 mg via ORAL
  Filled 2018-03-06 (×2): qty 1

## 2018-03-06 MED ORDER — GABAPENTIN 300 MG PO CAPS
300.0000 mg | ORAL_CAPSULE | Freq: Three times a day (TID) | ORAL | Status: DC
  Administered 2018-03-06 – 2018-03-07 (×5): 300 mg via ORAL
  Filled 2018-03-06 (×5): qty 1

## 2018-03-06 MED ORDER — DOXYCYCLINE HYCLATE 100 MG PO TABS
100.0000 mg | ORAL_TABLET | Freq: Two times a day (BID) | ORAL | Status: DC
  Administered 2018-03-06 – 2018-03-07 (×4): 100 mg via ORAL
  Filled 2018-03-06 (×4): qty 1

## 2018-03-06 MED ORDER — ACETAMINOPHEN 325 MG PO TABS: 650.0000 mg | ORAL_TABLET | ORAL | Status: DC | PRN

## 2018-03-06 MED ORDER — INSULIN GLARGINE 100 UNIT/ML ~~LOC~~ SOLN
40.0000 [IU] | Freq: Every day | SUBCUTANEOUS | Status: DC
  Administered 2018-03-06 – 2018-03-07 (×2): 40 [IU] via SUBCUTANEOUS
  Filled 2018-03-06 (×2): qty 0.4

## 2018-03-06 MED ORDER — AMLODIPINE BESYLATE 5 MG PO TABS
10.0000 mg | ORAL_TABLET | Freq: Every day | ORAL | Status: DC
  Administered 2018-03-06 – 2018-03-07 (×2): 10 mg via ORAL
  Filled 2018-03-06 (×2): qty 2

## 2018-03-06 MED ORDER — ONDANSETRON HCL 4 MG/2ML IJ SOLN: 4.0000 mg | Freq: Four times a day (QID) | INTRAMUSCULAR | Status: DC | PRN

## 2018-03-06 MED ORDER — INSULIN GLARGINE 100 UNIT/ML ~~LOC~~ SOLN
10.0000 [IU] | Freq: Every day | SUBCUTANEOUS | Status: DC
  Administered 2018-03-06 (×2): 10 [IU] via SUBCUTANEOUS
  Filled 2018-03-06 (×4): qty 0.1

## 2018-03-06 MED ORDER — INSULIN ASPART 100 UNIT/ML ~~LOC~~ SOLN
6.0000 [IU] | Freq: Once | SUBCUTANEOUS | Status: AC
  Administered 2018-03-06: 6 [IU] via SUBCUTANEOUS

## 2018-03-06 MED ORDER — TERBINAFINE HCL 250 MG PO TABS
250.0000 mg | ORAL_TABLET | Freq: Every day | ORAL | Status: DC
  Administered 2018-03-06 – 2018-03-07 (×2): 250 mg via ORAL
  Filled 2018-03-06 (×2): qty 1

## 2018-03-06 MED ORDER — DM-GUAIFENESIN ER 30-600 MG PO TB12
1.0000 | ORAL_TABLET | Freq: Two times a day (BID) | ORAL | Status: DC | PRN
  Administered 2018-03-06: 1 via ORAL
  Filled 2018-03-06: qty 1

## 2018-03-06 MED ORDER — CARVEDILOL 12.5 MG PO TABS
25.0000 mg | ORAL_TABLET | Freq: Two times a day (BID) | ORAL | Status: DC
  Administered 2018-03-06 – 2018-03-07 (×3): 25 mg via ORAL
  Filled 2018-03-06 (×3): qty 2

## 2018-03-06 MED ORDER — ASPIRIN EC 81 MG PO TBEC
81.0000 mg | DELAYED_RELEASE_TABLET | Freq: Every day | ORAL | Status: DC
  Administered 2018-03-06 – 2018-03-07 (×2): 81 mg via ORAL
  Filled 2018-03-06 (×2): qty 1

## 2018-03-06 MED ORDER — IPRATROPIUM-ALBUTEROL 0.5-2.5 (3) MG/3ML IN SOLN
3.0000 mL | Freq: Four times a day (QID) | RESPIRATORY_TRACT | Status: DC
  Administered 2018-03-06 – 2018-03-07 (×4): 3 mL via RESPIRATORY_TRACT
  Filled 2018-03-06 (×5): qty 3

## 2018-03-06 MED ORDER — NITROGLYCERIN 0.4 MG SL SUBL: 0.4000 mg | SUBLINGUAL_TABLET | SUBLINGUAL | Status: DC | PRN

## 2018-03-06 MED ORDER — ROSUVASTATIN CALCIUM 40 MG PO TABS
40.0000 mg | ORAL_TABLET | Freq: Every day | ORAL | Status: DC
  Administered 2018-03-06 – 2018-03-07 (×2): 40 mg via ORAL
  Filled 2018-03-06 (×2): qty 1

## 2018-03-06 MED ORDER — HEPARIN SODIUM (PORCINE) 5000 UNIT/ML IJ SOLN
5000.0000 [IU] | Freq: Three times a day (TID) | INTRAMUSCULAR | Status: DC
  Administered 2018-03-06 – 2018-03-07 (×5): 5000 [IU] via SUBCUTANEOUS
  Filled 2018-03-06 (×5): qty 1

## 2018-03-06 MED ORDER — INSULIN ASPART 100 UNIT/ML ~~LOC~~ SOLN
0.0000 [IU] | Freq: Three times a day (TID) | SUBCUTANEOUS | Status: DC
  Administered 2018-03-06: 2 [IU] via SUBCUTANEOUS
  Administered 2018-03-06: 9 [IU] via SUBCUTANEOUS
  Administered 2018-03-06: 3 [IU] via SUBCUTANEOUS
  Administered 2018-03-07: 7 [IU] via SUBCUTANEOUS
  Administered 2018-03-07: 2 [IU] via SUBCUTANEOUS

## 2018-03-06 MED ORDER — PREDNISONE 20 MG PO TABS
40.0000 mg | ORAL_TABLET | Freq: Every day | ORAL | Status: DC
  Administered 2018-03-07: 40 mg via ORAL
  Filled 2018-03-06: qty 2

## 2018-03-06 MED ORDER — METHYLPREDNISOLONE SODIUM SUCC 125 MG IJ SOLR
60.0000 mg | Freq: Three times a day (TID) | INTRAMUSCULAR | Status: DC
  Administered 2018-03-06 (×2): 60 mg via INTRAVENOUS
  Filled 2018-03-06 (×3): qty 2

## 2018-03-06 MED ORDER — SODIUM CHLORIDE 0.9 % IV SOLN
INTRAVENOUS | Status: DC
  Administered 2018-03-06: 02:00:00 via INTRAVENOUS

## 2018-03-06 MED ORDER — INSULIN GLARGINE 100 UNIT/ML SOLOSTAR PEN: 10.0000 [IU] | PEN_INJECTOR | Freq: Two times a day (BID) | SUBCUTANEOUS | Status: DC

## 2018-03-06 MED ORDER — HYDRALAZINE HCL 20 MG/ML IJ SOLN: 5.0000 mg | INTRAMUSCULAR | Status: DC | PRN

## 2018-03-06 MED ORDER — FLUOXETINE HCL 20 MG PO CAPS
20.0000 mg | ORAL_CAPSULE | Freq: Every day | ORAL | Status: DC
  Filled 2018-03-06 (×2): qty 1

## 2018-03-06 MED ORDER — ALBUTEROL SULFATE (2.5 MG/3ML) 0.083% IN NEBU: 5.0000 mg | INHALATION_SOLUTION | RESPIRATORY_TRACT | Status: DC | PRN

## 2018-03-06 MED ORDER — MORPHINE SULFATE (PF) 4 MG/ML IV SOLN
2.0000 mg | INTRAVENOUS | Status: DC | PRN
  Administered 2018-03-06: 2 mg via INTRAVENOUS
  Filled 2018-03-06: qty 1

## 2018-03-06 MED ORDER — ALPRAZOLAM 0.25 MG PO TABS
0.2500 mg | ORAL_TABLET | Freq: Two times a day (BID) | ORAL | Status: DC | PRN
  Administered 2018-03-06 – 2018-03-07 (×3): 0.25 mg via ORAL
  Filled 2018-03-06 (×3): qty 1

## 2018-03-06 MED ORDER — HYDRALAZINE HCL 50 MG PO TABS
100.0000 mg | ORAL_TABLET | Freq: Three times a day (TID) | ORAL | Status: DC
  Administered 2018-03-06 – 2018-03-07 (×5): 100 mg via ORAL
  Filled 2018-03-06 (×5): qty 2

## 2018-03-06 MED ORDER — LORATADINE 10 MG PO TABS
10.0000 mg | ORAL_TABLET | Freq: Every day | ORAL | Status: DC
  Administered 2018-03-06 – 2018-03-07 (×2): 10 mg via ORAL
  Filled 2018-03-06 (×3): qty 1

## 2018-03-06 NOTE — Progress Notes (Signed)
Attending MD note  Patient was seen, examined,treatment plan was discussed with the PA-S.  I have personally reviewed the clinical findings, lab, imaging studies and management of this patient in detail. I agree with the documentation, as recorded by the PA-S  Patient is 62 year old female with history of coronary artery disease, hospitalization at the beginning of this year with cardiac arrest, underwent cardiac evaluation with catheterization which showed nonobstructive coronary artery disease, she was also found to have systolic dysfunction which on follow-up echo showed improvement in the most recent EF is 50%, chronic kidney disease stage III, diabetes mellitus, presumed COPD, presented to the hospital last night with atypical chest pain, shortness of breath, and was found to be wheezing.  She was admitted for COPD exacerbation and chest pain rule out.  BP (!) 97/58 (BP Location: Right Arm)   Pulse 85   Temp 98.4 F (36.9 C) (Oral)   Resp 16   Ht 5' 4.5" (1.638 m)   Wt 84 kg (185 lb 1.6 oz)   SpO2 93%   BMI 31.28 kg/m  On Exam: Gen. exam: Awake, alert, not in any distress Chest: Good air entry bilaterally, no rhonchi or rales, overall decreased breath sounds CVS: S1-S2 regular, no murmurs Abdomen: Soft, nontender and nondistended Neurology: Non-focal Skin: No rash or lesions  Plan  Chest pain -Very atypical, changing with position and reproducible to anterior chest wall palpation, cardiac enzymes have remained negative.  She recently had a cardiac catheterization in January of this year which showed nonobstructive coronary artery disease.  Suspect MSK nature.  COPD exacerbation -COPD is not confirmed but strongly suspected.  She was placed on IV steroids, antibiotics as well as nebulizer, her wheezing has resolved this morning and she reports her breathing is slightly better, will discontinue IV steroids and change to prednisone.. -Benefit from PFTs as an outpatient, but at  minimum may need to go home with albuterol as needed  Chronic combined systolic and diastolic CHF -She was recently seen by cardiology and at that time due to some weight gain her Lasix was increased.  Hold now Lasix given worsening creatinine, currently appears euvolemic but monitor volume status carefully -Add daily weights -Stop IV fluids  Acute kidney injury on chronic kidney disease stage III -Baseline creatinine around 1.5, gradually increasing as an outpatient in the 2 range last month, on admission her creatinine is 3.  Hold Lasix, she has received IV fluids, discontinue IV fluids today and recheck renal function tomorrow morning -Daily weights -Per outpatient notes she has a referral for nephrology and will follow-up  Type 2 diabetes mellitus -Continue Lantus and sliding scale, CBG is in the upper 100 lower 200s but expect to improve once steroids will be converted to p.o.  Hypertension -Hold Cozaar and Lasix due to AKI, continue her other medications  Tinea capitis -Patient complains of having an infection with "worms and fungus" on her scalp, and feels like no treatment is working for her.  I recommended that she sees a dermatologist, both for now continue terbinafine  Anemia of chronic disease/chronic kidney disease -He is currently being evaluated by gastroenterology as an outpatient, she is scheduled to have endoscopic evaluation in September -Hemoglobin is stable  Rest as below  Blaike Newburn M. Cruzita Lederer, MD [facility] [phone]  If 7PM-7AM, please contact night-coverage [email or web address] Password [email or web address]    PROGRESS NOTE  Dezaree Tracey  XBL:[id number] DOB: 12-Jan-1956 DOA: 03/05/2018 PCP: Charlott Rakes, MD   Brief Narrative:  Ms. Gullickson s a  62 y.o. female with a past medical history significant for hypertension, hyperlipidemia, diabetes mellitus 2, CKD3, recent cardiac arrest, and CHF w/EF 50% and grade 2 diastolic dysfunction (follows with Dr. Sallyanne Kuster). She had a  cardiac cath on 09/11/17 showing moderate non-obstructive CAD. She was admitted to [facility] with 10/10 sharp constant CP radiating to the neck, SOB, a productive cough (white mucous) and wheezing. She was found to have reproducible chest wall tenderness.  Her troponin was negative. CXR showed interstitial/reticular densities that appeared chronic. EKG showed nothing acute.  Subjective: Patient reports that her breathing and SOB is improved today. She still has somewhat of a productive cough. Her CP is also improved since yesterday. The pain is worse with movement and palpation. She denies an NVD, hemoptysis,  dizziness or fevers/chills.  Assessment & Plan:  Chest pain / history of CAD  - Patient had a cardiac cath on 09/11/17 which showed moderate non-obstructive CAD  - Low suspicion for DVT or PE  - Serial troponins have been negative. No further EKG changes. Continue aspirin, Crestor and carvedilol. PRN morphine and NTG. Suspect chest pain is due to MSK pain or costochondritis.  - No need for 2D echo as one was done on 02/26/18 - Risk stratification: Lipid Panel shows poorly controlled hyperlipidemia. A1C 6.2%. Follow up with cardiology as an outpatient.   Acute COPD Exacerbation - CXR showed bilateral basilar interstitial reticular opacity with no obvious infiltrates. Continue scheduled duoneb and prn albuterol. Solumedrol 60 mg IV TID. Doxycycline PO. Continue singulair and mucinex.  - Follow blood cultures. - Respiratory panel shows only RSV. - Patient clinically improving, change iv steroids to prednisone. Likely will be discharged with steroid taper.  Hypertension  - Continue home medications: hydralazine, norvasc, carvedilol. Holding lasix and cozaar due to AKI. PRN IV hydralazine.  Chronic systolic and diastolic CH, compensated  - Echo 02/26/18 showed EF of 50%. BNP 32.4. No clinical signs of a CHF exacerbation. Holding lasix due to AKI.    HLD - Continue Crestor. Follow up as an  outpatient. Encourage better diet.  Diabetes Mellitus type 2 - A1C is 6.2%. Lantus dose decreased to 40-10 BID. Sliding scale insulin. - Monitor BGs - stable.  Acute on Chronic Kidney Disease III - Baseline sCr is 1.2-1.3. AKI likely secondary to dehydration. Hold ARB/Lasix. Gentle IVF - Continue to monitor with daily BMP  Anemia  - Hb stable at 10.7. Follow with a CBC. - Follow up as an outpatient - colonoscopy scheduled in September.  Anxiety/Depression  - Continue prosac and buspar. Prn xanax.  DVT prophylaxis: Lovenox Code Status: FULL  Family Communication: Discussed with patient. No family at bedside. Disposition Plan: Likely discharge 1-2 days pending clinical improvement of COPD exacerbation   Consultants:   None   Procedures:   None  Antimicrobials:   Doxycycline PO    Objective: Vitals:   03/06/18 0410 03/06/18 0414 03/06/18 0821 03/06/18 0851  BP: 106/60   120/70  Pulse: 79  78 82  Resp: (!) 22  20 14   Temp: 98.3 F (36.8 C)     TempSrc: Oral     SpO2: 96% 97% 96% 99%  Weight:      Height:        Intake/Output Summary (Last 24 hours) at 03/06/2018 1156 Last data filed at 03/06/2018 1000 Gross per 24 hour  Intake 577.43 ml  Output -  Net 577.43 ml   Filed Weights   03/06/18 0137  Weight: 84 kg (185 lb 1.6  oz)    Examination: General appearance: Deconditioned adult female, awake and alert. NAD.   HEENT: Anicteric, conjunctiva pink, lids and lashes normal. No nasal deformity, discharge, epistaxis.  Lips moist Skin: Warm and dry.  No jaundice.  Hyperpigmented skin forehead and scalp; some crusting noted on scalp. Cardiac: RRR, nl S1-S2, no murmurs appreciated. No JVD. No LE edema. No cyanosis. Distal pulses are intact bilaterally. Reproducible chest wall tenderness. Respiratory: Normal respiratory rate and rhythm.Good air movement. No crackles or rales. Abdomen: Abdomen soft and non-distended. TTP. No ascites, masses, hepatosplenomegaly  appreciated. Positive bowel sounds throughout. Neuro: AOx3. Moves all extremities. Speech fluent.     Data Reviewed: I have personally reviewed following labs and imaging studies:  CBC: Recent Labs  Lab 03/05/18 2149  WBC 11.6*  HGB 10.7*  HCT 34.2*  MCV 76.3*  PLT 196   Basic Metabolic Panel: Recent Labs  Lab 03/05/18 2149  NA 140  K 3.7  CL 100  CO2 26  GLUCOSE 120*  BUN 35*  CREATININE 3.00*  CALCIUM 9.5   GFR: Estimated Creatinine Clearance: 20.6 mL/min (A) (by C-G formula based on SCr of 3 mg/dL (H)).  Cardiac Enzymes: Recent Labs  Lab 03/06/18 0046 03/06/18 0547  TROPONINI <0.03 <0.03   BNP (last 3 results) No results for input(s): PROBNP in the last 8760 hours. HbA1C: Recent Labs    03/06/18 0020  HGBA1C 6.2*   CBG: Recent Labs  Lab 03/06/18 0122 03/06/18 0848  GLUCAP 95 218*   Lipid Profile: Recent Labs    03/06/18 0547  CHOL 229*  HDL 29*  LDLCALC 156*  TRIG 218*  CHOLHDL 7.9   Urine analysis:    Component Value Date/Time   COLORURINE YELLOW 08/30/2017 0159   APPEARANCEUR CLEAR 08/30/2017 0159   LABSPEC 1.010 08/30/2017 0159   PHURINE 5.0 08/30/2017 0159   GLUCOSEU 50 (A) 08/30/2017 0159   HGBUR NEGATIVE 08/30/2017 0159   BILIRUBINUR NEGATIVE 08/30/2017 0159   KETONESUR NEGATIVE 08/30/2017 0159   PROTEINUR 100 (A) 08/30/2017 0159   NITRITE NEGATIVE 08/30/2017 0159   LEUKOCYTESUR NEGATIVE 08/30/2017 0159   Recent Results (from the past 240 hour(s))  Respiratory Panel by PCR     Status: Abnormal   Collection Time: 03/06/18  1:48 AM  Result Value Ref Range Status   Adenovirus NOT DETECTED NOT DETECTED Final   Coronavirus 229E NOT DETECTED NOT DETECTED Final   Coronavirus HKU1 NOT DETECTED NOT DETECTED Final   Coronavirus NL63 NOT DETECTED NOT DETECTED Final   Coronavirus OC43 NOT DETECTED NOT DETECTED Final   Metapneumovirus NOT DETECTED NOT DETECTED Final   Rhinovirus / Enterovirus NOT DETECTED NOT DETECTED Final    Influenza A NOT DETECTED NOT DETECTED Final   Influenza B NOT DETECTED NOT DETECTED Final   Parainfluenza Virus 1 NOT DETECTED NOT DETECTED Final   Parainfluenza Virus 2 NOT DETECTED NOT DETECTED Final   Parainfluenza Virus 3 NOT DETECTED NOT DETECTED Final   Parainfluenza Virus 4 NOT DETECTED NOT DETECTED Final   Respiratory Syncytial Virus DETECTED (A) NOT DETECTED Final    Comment: CRITICAL RESULT CALLED TO, READ BACK BY AND VERIFIED WITH: E DODOO RN 03/06/18 0504 JDW    Bordetella pertussis NOT DETECTED NOT DETECTED Final   Chlamydophila pneumoniae NOT DETECTED NOT DETECTED Final   Mycoplasma pneumoniae NOT DETECTED NOT DETECTED Final    Comment: Performed at [facility], [place] [address], [place], [place] [address]    Radiology Studies: Dg Chest 2 View  Result Date: 03/05/2018 CLINICAL DATA:  Chest pain. EXAM: CHEST - 2 VIEW COMPARISON:  Radiograph of October 22, 2017. FINDINGS: Stable cardiomediastinal silhouette. No pneumothorax or pleural effusion is noted. Atherosclerosis of thoracic aorta is noted. Stable bibasilar interstitial and reticular densities are noted most consistent with scarring or chronic interstitial lung disease. Acute superimposed inflammation or edema cannot be excluded. Bony thorax is unremarkable. IMPRESSION: Stable bibasilar interstitial and reticular densities are noted most consistent with scarring or chronic interstitial lung disease, but acute superimposed edema or inflammation cannot be excluded. Aortic Atherosclerosis (ICD10-I70.0). Electronically Signed   By: Marijo Conception, M.D.   On: 03/05/2018 22:06   Scheduled Meds: . amLODipine  10 mg Oral Daily  . aspirin EC  81 mg Oral Daily  . busPIRone  5 mg Oral TID  . carvedilol  25 mg Oral BID WC  . doxycycline  100 mg Oral Q12H  . FLUoxetine  20 mg Oral Daily  . gabapentin  300 mg Oral TID  . heparin  5,000 Units Subcutaneous Q8H  . hydrALAZINE  100 mg Oral TID  . insulin aspart  0-9 Units  Subcutaneous TID WC  . insulin glargine  10 Units Subcutaneous QHS  . insulin glargine  40 Units Subcutaneous Daily  . ipratropium-albuterol  3 mL Nebulization Q6H  . loratadine  10 mg Oral Daily  . methylPREDNISolone (SOLU-MEDROL) injection  60 mg Intravenous Q8H  . montelukast  10 mg Oral QHS  . rosuvastatin  40 mg Oral Daily  . terbinafine  250 mg Oral Daily   Continuous Infusions: . sodium chloride 50 mL/hr at 03/06/18 1000    LOS: 0 days   Samantha Couillard, PA-S [facility] 03/06/2018, 11:56 AM   [email or web address] Password [email or web address] If 7PM-7AM, please contact night-coverage

## 2018-03-06 NOTE — Progress Notes (Signed)
Patient admitted to [facility] from ED. Patient alert and oriented. Admission assessment complete. Patient educated on plan of care. No complaints of pain at this time. Will continue to monitor patient.

## 2018-03-06 NOTE — Progress Notes (Signed)
Patient has calm down at this time.CN able to talk to the patient.

## 2018-03-06 NOTE — Progress Notes (Signed)
  Pt is refusing CPAP for the night. RT will continue to monitor as needed.

## 2018-03-06 NOTE — Progress Notes (Signed)
Patient was so upset becauseher blood sugar was high.I explained it to her that it was because of  The Solumedrol that was given to her. Then she got so mad when I gave her the Carvedilol 2 tablets 12.5 mg  To total to 25mg  . Even if I explained it to her in detail  12.5 + 12.5 =25mg  she cannot or she refused to understand. She wants to take her 25mg  from home  The patient is yelling at me at this time. She called her husband on the phone still yelling stating " they are not feeding me here", I'm going to another hospital.  She accidentally pulled her PIV out.  Will defer PIV insertion at this time since patient is very upset/angry , yelling on the phone at this time.

## 2018-03-07 ENCOUNTER — Encounter: Payer: Medicaid Other | Admitting: Family Medicine

## 2018-03-07 DIAGNOSIS — F329 Major depressive disorder, single episode, unspecified: Secondary | ICD-10-CM

## 2018-03-07 DIAGNOSIS — R079 Chest pain, unspecified: Secondary | ICD-10-CM | POA: Diagnosis not present

## 2018-03-07 DIAGNOSIS — N183 Chronic kidney disease, stage 3 (moderate): Secondary | ICD-10-CM

## 2018-03-07 DIAGNOSIS — J441 Chronic obstructive pulmonary disease with (acute) exacerbation: Secondary | ICD-10-CM

## 2018-03-07 DIAGNOSIS — F419 Anxiety disorder, unspecified: Secondary | ICD-10-CM | POA: Diagnosis not present

## 2018-03-07 DIAGNOSIS — I10 Essential (primary) hypertension: Secondary | ICD-10-CM

## 2018-03-07 DIAGNOSIS — E785 Hyperlipidemia, unspecified: Secondary | ICD-10-CM

## 2018-03-07 DIAGNOSIS — I2583 Coronary atherosclerosis due to lipid rich plaque: Secondary | ICD-10-CM

## 2018-03-07 DIAGNOSIS — I2510 Atherosclerotic heart disease of native coronary artery without angina pectoris: Secondary | ICD-10-CM | POA: Diagnosis not present

## 2018-03-07 DIAGNOSIS — E1122 Type 2 diabetes mellitus with diabetic chronic kidney disease: Secondary | ICD-10-CM

## 2018-03-07 DIAGNOSIS — N179 Acute kidney failure, unspecified: Secondary | ICD-10-CM

## 2018-03-07 DIAGNOSIS — D509 Iron deficiency anemia, unspecified: Secondary | ICD-10-CM

## 2018-03-07 DIAGNOSIS — I5042 Chronic combined systolic (congestive) and diastolic (congestive) heart failure: Secondary | ICD-10-CM

## 2018-03-07 DIAGNOSIS — Z794 Long term (current) use of insulin: Secondary | ICD-10-CM

## 2018-03-07 LAB — CBC
HCT: 33.5 % — ABNORMAL LOW (ref 36.0–46.0)
Hemoglobin: 10.5 g/dL — ABNORMAL LOW (ref 12.0–15.0)
MCH: 24.2 pg — ABNORMAL LOW (ref 26.0–34.0)
MCHC: 31.3 g/dL (ref 30.0–36.0)
MCV: 77.4 fL — AB (ref 78.0–100.0)
PLATELETS: 298 10*3/uL (ref 150–400)
RBC: 4.33 MIL/uL (ref 3.87–5.11)
RDW: 21.7 % — AB (ref 11.5–15.5)
WBC: 25.4 10*3/uL — AB (ref 4.0–10.5)

## 2018-03-07 LAB — BASIC METABOLIC PANEL
ANION GAP: 10 (ref 5–15)
BUN: 49 mg/dL — ABNORMAL HIGH (ref 8–23)
CALCIUM: 9 mg/dL (ref 8.9–10.3)
CO2: 27 mmol/L (ref 22–32)
Chloride: 106 mmol/L (ref 98–111)
Creatinine, Ser: 2.73 mg/dL — ABNORMAL HIGH (ref 0.44–1.00)
GFR, EST AFRICAN AMERICAN: 20 mL/min — AB (ref >60)
GFR, EST NON AFRICAN AMERICAN: 18 mL/min — AB (ref >60)
GLUCOSE: 166 mg/dL — AB (ref 70–99)
Potassium: 4.2 mmol/L (ref 3.5–5.1)
SODIUM: 143 mmol/L (ref 135–145)

## 2018-03-07 LAB — GLUCOSE, CAPILLARY
GLUCOSE-CAPILLARY: 194 mg/dL — AB (ref 70–99)
Glucose-Capillary: 304 mg/dL — ABNORMAL HIGH (ref 70–99)

## 2018-03-07 LAB — PROCALCITONIN: PROCALCITONIN: 0.11 ng/mL

## 2018-03-07 LAB — UREA NITROGEN, URINE: Urea Nitrogen, Ur: 200 mg/dL

## 2018-03-07 MED ORDER — DOXYCYCLINE HYCLATE 100 MG PO TABS: 100.0000 mg | ORAL_TABLET | Freq: Two times a day (BID) | ORAL | 0 refills | Status: AC

## 2018-03-07 MED ORDER — PREDNISONE 20 MG PO TABS: 20.0000 mg | ORAL_TABLET | Freq: Every day | ORAL | 0 refills | Status: AC

## 2018-03-07 MED ORDER — IPRATROPIUM-ALBUTEROL 0.5-2.5 (3) MG/3ML IN SOLN: 3.0000 mL | Freq: Four times a day (QID) | RESPIRATORY_TRACT | 0 refills | Status: DC | PRN

## 2018-03-07 NOTE — Evaluation (Signed)
Physical Therapy Evaluation Patient Details Name: Brenda Sims MRN: [id number] DOB: 02-28-1956 Today's Date: 03/07/2018   History of Present Illness  62 year old female with history of coronary artery disease, hospitalization at the beginning of this year with cardiac arrest, underwent cardiac evaluation with catheterization which showed nonobstructive coronary artery disease, she was also found to have systolic dysfunction which on follow-up echo showed improvement in the most recent EF is 50%, chronic kidney disease stage III, diabetes mellitus, presumed COPD, presented to the hospital last night with atypical chest pain, shortness of breath, and was found to be wheezing.  She was admitted for COPD exacerbation and chest pain rule out  Clinical Impression  Orders received for PT evaluation. Patient demonstrates independence with all activity. No chest pain and saturations and HR stable. No further acute PT needs, will sign off.     Follow Up Recommendations No PT follow up    Equipment Recommendations  None recommended by PT    Recommendations for Other Services       Precautions / Restrictions Restrictions Weight Bearing Restrictions: No      Mobility  Bed Mobility Overal bed mobility: Independent                Transfers Overall transfer level: Independent Equipment used: None                Ambulation/Gait Ambulation/Gait assistance: Independent Gait Distance (Feet): 410 Feet Assistive device: None Gait Pattern/deviations: WFL(Within Functional Limits)   Gait velocity interpretation: >4.37 ft/sec, indicative of normal walking speed General Gait Details: steady with ambulatino and higher level balance tasks  Stairs            Wheelchair Mobility    Modified Rankin (Stroke Patients Only)       Balance Overall balance assessment: Independent                                           Pertinent Vitals/Pain Pain Assessment:  No/denies pain    Home Living Family/patient expects to be discharged to:: Private residence Living Arrangements: Spouse/significant other Available Help at Discharge: Family Type of Home: House Home Access: Stairs to enter   Technical brewer of Steps: 1 Home Layout: One level        Prior Function Level of Independence: Independent               Hand Dominance   Dominant Hand: Left    Extremity/Trunk Assessment   Upper Extremity Assessment Upper Extremity Assessment: Overall WFL for tasks assessed    Lower Extremity Assessment Lower Extremity Assessment: Overall WFL for tasks assessed       Communication   Communication: No difficulties  Cognition Arousal/Alertness: Awake/alert Behavior During Therapy: WFL for tasks assessed/performed Overall Cognitive Status: Within Functional Limits for tasks assessed                                        General Comments      Exercises     Assessment/Plan    PT Assessment Patent does not need any further PT services  PT Problem List         PT Treatment Interventions      PT Goals (Current goals can be found in the Care Plan section)  Acute Rehab PT  Goals PT Goal Formulation: All assessment and education complete, DC therapy    Frequency     Barriers to discharge        Co-evaluation               AM-PAC PT "6 Clicks" Daily Activity  Outcome Measure Difficulty turning over in bed (including adjusting bedclothes, sheets and blankets)?: None Difficulty moving from lying on back to sitting on the side of the bed? : None Difficulty sitting down on and standing up from a chair with arms (e.g., wheelchair, bedside commode, etc,.)?: None Help needed moving to and from a bed to chair (including a wheelchair)?: None Help needed walking in hospital room?: None Help needed climbing 3-5 steps with a railing? : A Little 6 Click Score: 23    End of Session Equipment Utilized During  Treatment: Gait belt Activity Tolerance: Patient tolerated treatment well     PT Visit Diagnosis: Difficulty in walking, not elsewhere classified (R26.2)    Time: 8099-8338 PT Time Calculation (min) (ACUTE ONLY): 13 min   Charges:   PT Evaluation $PT Eval Low Complexity: [place], PT DPT  Board Certified Neurologic Specialist [place] 03/07/2018, 10:01 AM

## 2018-03-07 NOTE — Discharge Summary (Signed)
Discharge Summary        Discharge Summary  Brenda Sims FKC:[id number] DOB: 05-02-1956  PCP: Charlott Rakes, MD  Admit date: 03/05/2018 Discharge date: 03/07/2018  Time spent: 25 minutes  Recommendations for Outpatient Follow-up:  1. Follow-up with your cardiologist 2. Follow-up with your PCP stable 3. Take your medications as prescribed  Discharge Diagnoses:  Active Hospital Problems   Diagnosis Date Noted  . Chest pain 10/19/2017  . COPD exacerbation ([place]) 03/05/2018  . CAD (coronary artery disease) 03/05/2018  . Iron deficiency anemia 02/26/2018  . HLD (hyperlipidemia) 10/19/2017  . Anxiety and depression 10/19/2017  . Chronic combined systolic and diastolic CHF (congestive heart failure) ([place]) 10/07/2017  . Acute renal failure superimposed on stage 3 chronic kidney disease ([place]) 10/07/2017  . Type II diabetes mellitus with renal manifestations ([place])   . Essential hypertension 08/29/2017    Resolved Hospital Problems  No resolved problems to display.    Discharge Condition: Stable  Diet recommendation: Heart healthy diet low in salt  Vitals:   03/07/18 1105 03/07/18 1108  BP:    Pulse: 71 (!) 127  Resp:    Temp:    SpO2:      History of present illness:  Brenda Sims is a 62 y.o. female with medical history significant of hypertension, hyperlipidemia, diabetes mellitus, depression, ICD, CKD-III, seizure, CHF with EF 50%,  previous cardiac arrest, who presents with chest pain, cough, SOB and wheezing.  Pt states that she started having chest pain last night, which is located in the front chest, constant, 10 out of 10 severity, sharp, radiating to the neck.  It is pleuritic and aggravated by deep breath.  She has chest wall tenderness on palpation.  No recent long distance traveling.  No tenderness in the calf areas.  Patient also has cough, shortness of breath and wheezing.  She coughs up white mucus. No fever or chills. She states that she has lower back pain,  radiating down to her left leg posteriorly. no loss control of bilateral bowel movement. She also reports bilateral hand tingling.  No symptoms of UTI or unilateral weakness. Pt states that her cardiologist dr. Sallyanne Kuster increased her Lasix to dose recently 7/15. Currently she is taking Lasix 80 mg in morning and 40 mg in the evening.   Troponins negative x3. Twelve-lead EKG independently reviewed revealed sinus rhythm with non specific ST-T changes. PT assessed and recommended no PT follow-up.   03/07/2018: Patient seen and examined at bedside.  She reports mild chest wall pain reproducible on palpation, suspect musculoskeletal.  Home O2 evaluation done with 100% O2 saturation on room air on ambulation.   On the day of discharge, the patient was hemodynamically stable.  She will need to follow-up with her cardiologist, PCP, post hospitalization.      Hospital Course:  Principal Problem:   Chest pain Active Problems:   Essential hypertension   Type II diabetes mellitus with renal manifestations (HCC)   Chronic combined systolic and diastolic CHF (congestive heart failure) (HCC)   Acute renal failure superimposed on stage 3 chronic kidney disease (HCC)   HLD (hyperlipidemia)   Anxiety and depression   Iron deficiency anemia   COPD exacerbation (HCC)   CAD (coronary artery disease)  Chest pain, ACS ruled out -Very atypical, changing with position and reproducible to anterior chest wall palpation, cardiac enzymes have remained negative.  She recently had a cardiac catheterization in January of this year which showed nonobstructive coronary artery disease.  Suspect MSK  nature. Follow-up with your cardiologist and PCP outpatient  COPD exacerbation -COPD is not confirmed but strongly suspected.  She was placed on IV steroids, antibiotics as well as nebulizer, her wheezing has resolved this morning and she reports her breathing is better, will discontinue IV steroids and change to  prednisone.. -Benefit from PFTs as an outpatient, but at minimum may need to go home with albuterol as needed Follow-up with PCP outpatient  Chronic combined systolic and diastolic CHF -She was recently seen by cardiology and at that time due to some weight gain her Lasix was increased.  Hold now Lasix given worsening creatinine, currently appears euvolemic but monitor volume status carefully Follow-up with your cardiologist outpatient  Improving acute kidney injury on chronic kidney disease stage III -Per outpatient notes she has a referral for nephrology Follow-up with your nephrologist outpatient Stop losartan due to AKI  Type 2 diabetes mellitus -Continue Lantus and sliding scale  Hypertension -Hold Cozaar and Lasix due to AKI, continue her other medications  Orthostatic hypotension Positive  orthostatic vital signs Suspect secondary to dehydration in the setting of diuresis use  Tinea capitis -Patient complains of having an infection with "worms and fungus" on her scalp, and feels like no treatment is working for her.  I recommended that she sees a dermatologist, for now continue terbinafine  Anemia of chronic disease/chronic kidney disease -He is currently being evaluated by gastroenterology as an outpatient, she is scheduled to have endoscopic evaluation in September -Hemoglobin is stable   Procedures:  None  Consultations:  None  Discharge Exam: BP 140/71 (BP Location: Right Arm)   Pulse (!) 127   Temp 98 F (36.7 C) (Oral)   Resp 20   Ht 5' 4.5" (1.638 m)   Wt 85.5 kg (188 lb 9.6 oz)   SpO2 94%   BMI 31.87 kg/m  . General: 62 y.o. year-old female well developed well nourished in no acute distress.  Alert and oriented x3. . Cardiovascular: Regular rate and rhythm with no rubs or gallops.  No thyromegaly or JVD noted.  Mild tenderness on palpation of the anterior chest wall. Marland Kitchen Respiratory: Clear to auscultation with no wheezes or rales. Good  inspiratory effort. . Abdomen: Soft nontender nondistended with normal bowel sounds x4 quadrants. . Musculoskeletal: No lower extremity edema. 2/4 pulses in all 4 extremities. . Skin: No ulcerative lesions noted or rashes . Psychiatry: Mood is appropriate for condition and setting  Discharge Instructions You were cared for by a hospitalist during your hospital stay. If you have any questions about your discharge medications or the care you received while you were in the hospital after you are discharged, you can call the unit and asked to speak with the hospitalist on call if the hospitalist that took care of you is not available. Once you are discharged, your primary care physician will handle any further medical issues. Please note that NO REFILLS for any discharge medications will be authorized once you are discharged, as it is imperative that you return to your primary care physician (or establish a relationship with a primary care physician if you do not have one) for your aftercare needs so that they can reassess your need for medications and monitor your lab values.  Discharge Instructions    DME Nebulizer machine   Complete by:  As directed    Patient needs a nebulizer to treat with the following condition:  COPD exacerbation ([place])     Allergies as of 03/07/2018  Reactions   Eggs Or Egg-derived Products Shortness Of Breath, Rash   Azithromycin Swelling   Facial/throat swelling   Cherry Swelling   Facial swelling      Medication List    STOP taking these medications   losartan 100 MG tablet Commonly known as:  COZAAR     TAKE these medications   amLODipine 10 MG tablet Commonly known as:  NORVASC Take 1 tablet (10 mg total) by mouth daily.   aspirin EC 81 MG tablet Take 1 tablet (81 mg total) by mouth daily.   busPIRone 5 MG tablet Commonly known as:  BUSPAR Take 1 tablet (5 mg total) by mouth 3 (three) times daily.   carvedilol 25 MG tablet Commonly known as:   COREG TAKE 1 TABLET (25 MG TOTAL) BY MOUTH 2 (TWO) TIMES DAILY WITH A MEAL.   cetirizine 10 MG tablet Commonly known as:  ZYRTEC Take 1 tablet (10 mg total) by mouth daily.   doxycycline 100 MG tablet Commonly known as:  VIBRA-TABS Take 1 tablet (100 mg total) by mouth 2 (two) times daily for 5 days.   FLUoxetine 20 MG capsule Commonly known as:  PROZAC Take 1 capsule (20 mg total) by mouth daily.   furosemide 40 MG tablet Commonly known as:  LASIX Take 2 tablets (80 mg total) by mouth every morning AND 1 tablet (40 mg total) every evening.   gabapentin 300 MG capsule Commonly known as:  NEURONTIN Take 1 capsule (300 mg total) by mouth 3 (three) times daily.   glucose blood test strip Commonly known as:  TRUE METRIX BLOOD GLUCOSE TEST Use as instructed   glucose blood test strip Commonly known as:  ACCU-CHEK AVIVA Use as instructed   guaiFENesin-dextromethorphan 100-10 MG/5ML syrup Commonly known as:  ROBITUSSIN DM Take 5 mLs by mouth every 4 (four) hours as needed for cough.   hydrALAZINE 100 MG tablet Commonly known as:  APRESOLINE Take 1 tablet (100 mg total) by mouth 3 (three) times daily.   insulin aspart 100 UNIT/ML injection Commonly known as:  NOVOLOG Inject 4 Units into the skin 3 (three) times daily with meals.   Insulin Glargine 100 UNIT/ML Solostar Pen Commonly known as:  LANTUS SOLOSTAR Inject 20 Units into the skin 2 (two) times daily. What changed:    how much to take  when to take this  additional instructions   Insulin Pen Needle 31G X 6 MM Misc Commonly known as:  TRUEPLUS PEN NEEDLES Use as directed   ipratropium-albuterol 0.5-2.5 (3) MG/3ML Soln Commonly known as:  DUONEB Take 3 mLs by nebulization every 6 (six) hours as needed.   ketoconazole 2 % shampoo Commonly known as:  NIZORAL Daily for the next week then twice a week until resolution   montelukast 10 MG tablet Commonly known as:  SINGULAIR Take 1 tablet (10 mg total) by  mouth at bedtime.   potassium chloride 10 MEQ tablet Commonly known as:  K-DUR,KLOR-CON Take 1 tablet (10 mEq total) by mouth daily.   predniSONE 20 MG tablet Commonly known as:  DELTASONE Take 1 tablet (20 mg total) by mouth daily with breakfast for 5 days. Start taking on:  03/08/2018   rosuvastatin 40 MG tablet Commonly known as:  CRESTOR Take 1 tablet (40 mg total) by mouth daily.   spironolactone 50 MG tablet Commonly known as:  ALDACTONE Take 1 tablet (50 mg total) by mouth daily.   terbinafine 250 MG tablet Commonly known as:  LAMISIL Take 1  tablet (250 mg total) by mouth daily. For scalp infection   TRUE METRIX METER Devi 1 kit by Does not apply route 4 (four) times daily.   ACCU-CHEK AVIVA device Use as instructed   TRUEPLUS LANCETS 28G Misc 28 g by Does not apply route 4 (four) times daily.   ACCU-CHEK SOFTCLIX LANCETS lancets Use as instructed            Durable Medical Equipment  (From admission, onward)        Start     Ordered   03/07/18 0000  DME Nebulizer machine    Question:  Patient needs a nebulizer to treat with the following condition  Answer:  COPD exacerbation ([place])   03/07/18 1224     Allergies  Allergen Reactions  . Eggs Or Egg-Derived Products Shortness Of Breath and Rash  . Azithromycin Swelling    Facial/throat swelling  . Cherry Swelling    Facial swelling   Follow-up Information    Charlott Rakes, MD. Call in 1 day(s).   Specialty:  Family Medicine Why:  please call for an appointment. Contact information: [place] [address] [phone]        Sanda Klein, MD .   Specialty:  Cardiology Contact information: [address] [place] [address] [phone]            The results of significant diagnostics from this hospitalization (including imaging, microbiology, ancillary and laboratory) are listed below for reference.    Significant Diagnostic Studies: Dg  Chest 2 View  Result Date: 03/05/2018 CLINICAL DATA:  Chest pain. EXAM: CHEST - 2 VIEW COMPARISON:  Radiograph of October 22, 2017. FINDINGS: Stable cardiomediastinal silhouette. No pneumothorax or pleural effusion is noted. Atherosclerosis of thoracic aorta is noted. Stable bibasilar interstitial and reticular densities are noted most consistent with scarring or chronic interstitial lung disease. Acute superimposed inflammation or edema cannot be excluded. Bony thorax is unremarkable. IMPRESSION: Stable bibasilar interstitial and reticular densities are noted most consistent with scarring or chronic interstitial lung disease, but acute superimposed edema or inflammation cannot be excluded. Aortic Atherosclerosis (ICD10-I70.0). Electronically Signed   By: Marijo Conception, M.D.   On: 03/05/2018 22:06    Microbiology: Recent Results (from the past 240 hour(s))  Culture, blood (routine x 2) Call MD if unable to obtain prior to antibiotics being given     Status: None (Preliminary result)   Collection Time: 03/06/18 12:30 AM  Result Value Ref Range Status   Specimen Description BLOOD RIGHT ARM  Final   Special Requests   Final    BOTTLES DRAWN AEROBIC AND ANAEROBIC Blood Culture results may not be optimal due to an excessive volume of blood received in culture bottles   Culture   Final    NO GROWTH 1 DAY Performed at [facility], [place] [address] [place],  [address]    Report Status PENDING  Incomplete  Culture, blood (routine x 2) Call MD if unable to obtain prior to antibiotics being given     Status: None (Preliminary result)   Collection Time: 03/06/18 12:48 AM  Result Value Ref Range Status   Specimen Description BLOOD RIGHT HAND  Final   Special Requests   Final    BOTTLES DRAWN AEROBIC ONLY Blood Culture results may not be optimal due to an excessive volume of blood received in culture bottles   Culture   Final    NO GROWTH 1 DAY Performed at [facility], [place]  [address] [place], [place] [address]    Report Status PENDING  Incomplete  Respiratory Panel by PCR     Status: Abnormal   Collection Time: 03/06/18  1:48 AM  Result Value Ref Range Status   Adenovirus NOT DETECTED NOT DETECTED Final   Coronavirus 229E NOT DETECTED NOT DETECTED Final   Coronavirus HKU1 NOT DETECTED NOT DETECTED Final   Coronavirus NL63 NOT DETECTED NOT DETECTED Final   Coronavirus OC43 NOT DETECTED NOT DETECTED Final   Metapneumovirus NOT DETECTED NOT DETECTED Final   Rhinovirus / Enterovirus NOT DETECTED NOT DETECTED Final   Influenza A NOT DETECTED NOT DETECTED Final   Influenza B NOT DETECTED NOT DETECTED Final   Parainfluenza Virus 1 NOT DETECTED NOT DETECTED Final   Parainfluenza Virus 2 NOT DETECTED NOT DETECTED Final   Parainfluenza Virus 3 NOT DETECTED NOT DETECTED Final   Parainfluenza Virus 4 NOT DETECTED NOT DETECTED Final   Respiratory Syncytial Virus DETECTED (A) NOT DETECTED Final    Comment: CRITICAL RESULT CALLED TO, READ BACK BY AND VERIFIED WITH: E DODOO RN 03/06/18 0504 JDW    Bordetella pertussis NOT DETECTED NOT DETECTED Final   Chlamydophila pneumoniae NOT DETECTED NOT DETECTED Final   Mycoplasma pneumoniae NOT DETECTED NOT DETECTED Final    Comment: Performed at [facility], [place] [address], [place],  [address]     Labs: Basic Metabolic Panel: Recent Labs  Lab 03/05/18 2149 03/07/18 0542  NA 140 143  K 3.7 4.2  CL 100 106  CO2 26 27  GLUCOSE 120* 166*  BUN 35* 49*  CREATININE 3.00* 2.73*  CALCIUM 9.5 9.0   Liver Function Tests: No results for input(s): AST, ALT, ALKPHOS, BILITOT, PROT, ALBUMIN in the last 168 hours. No results for input(s): LIPASE, AMYLASE in the last 168 hours. No results for input(s): AMMONIA in the last 168 hours. CBC: Recent Labs  Lab 03/05/18 2149 03/07/18 0542  WBC 11.6* 25.4*  HGB 10.7* 10.5*  HCT 34.2* 33.5*  MCV 76.3* 77.4*  PLT 289 298   Cardiac Enzymes: Recent Labs  Lab 03/06/18 0046  03/06/18 0547 03/06/18 1122  TROPONINI <0.03 <0.03 <0.03   BNP: BNP (last 3 results) Recent Labs    10/07/17 2030 10/19/17 2016 03/05/18 2149  BNP 419.8* 1,539.6* 32.4    ProBNP (last 3 results) No results for input(s): PROBNP in the last 8760 hours.  CBG: Recent Labs  Lab 03/06/18 1224 03/06/18 1702 03/06/18 2122 03/07/18 0848 03/07/18 1212  GLUCAP 185* 446* 234* 194* 304*       Signed:  Kayleen Memos, MD [facility] 03/07/2018, 12:27 PM

## 2018-03-07 NOTE — Progress Notes (Signed)
SATURATION QUALIFICATIONS: (This note is used to comply with regulatory documentation for home oxygen)  Patient Saturations on Room Air at Rest =95%  Patient Saturations on Room Air while Ambulating = 100%    Please briefly explain why patient needs home oxygen:

## 2018-03-07 NOTE — Progress Notes (Signed)
Patient discharge to home, alert and oriented, no complaints of any pain or discomfort. D/c instructions and follow up appointment discussed with patient, verbalized understanding. Personal belongings like cell phone and dentures  with patient. PIV removed no s/s of infiltration or swelling noted.

## 2018-03-07 NOTE — Discharge Instructions (Addendum)
Chest Wall Pain Chest wall pain is pain in or around the bones and muscles of your chest. Sometimes, an injury causes this pain. Sometimes, the cause may not be known. This pain may take several weeks or longer to get better. Follow these instructions at home: Pay attention to any changes in your symptoms. Take these actions to help with your pain:  Rest as told by your doctor.  Avoid activities that cause pain. Try not to use your chest, belly (abdominal), or side muscles to lift heavy things.  If directed, apply ice to the painful area: ? Put ice in a plastic bag. ? Place a towel between your skin and the bag. ? Leave the ice on for 20 minutes, 2-3 times per day.  Take over-the-counter and prescription medicines only as told by your doctor.  Do not use tobacco products, including cigarettes, chewing tobacco, and e-cigarettes. If you need help quitting, ask your doctor.  Keep all follow-up visits as told by your doctor. This is important.  Contact a doctor if:  You have a fever.  Your chest pain gets worse.  You have new symptoms. Get help right away if:  You feel sick to your stomach (nauseous) or you throw up (vomit).  You feel sweaty or light-headed.  You have a cough with phlegm (sputum) or you cough up blood.  You are short of breath. This information is not intended to replace advice given to you by your health care provider. Make sure you discuss any questions you have with your health care provider. Document Released: 01/16/2008 Document Revised: 01/05/2016 Document Reviewed: 10/25/2014 Elsevier Interactive Patient Education  2018 [employer] Chronic Obstructive Pulmonary Disease Exacerbation Chronic obstructive pulmonary disease (COPD) is a common lung problem. In COPD, the flow of air from the lungs is limited. COPD exacerbations are times that breathing gets worse and you need extra treatment. Without treatment they can be life threatening. If they happen  often, your lungs can become more damaged. If your COPD gets worse, your doctor may treat you with:  Medicines.  Oxygen.  Different ways to clear your airway, such as using a mask.  Follow these instructions at home:  Do not smoke.  Avoid tobacco smoke and other things that bother your lungs.  If given, take your antibiotic medicine as told. Finish the medicine even if you start to feel better.  Only take medicines as told by your doctor.  Drink enough fluids to keep your pee (urine) clear or pale yellow (unless your doctor has told you not to).  Use a cool mist machine (vaporizer).  If you use oxygen or a machine that turns liquid medicine into a mist (nebulizer), continue to use them as told.  Keep up with shots (vaccinations) as told by your doctor.  Exercise regularly.  Eat healthy foods.  Keep all doctor visits as told. Get help right away if:  You are very short of breath and it gets worse.  You have trouble talking.  You have bad chest pain.  You have blood in your spit (sputum).  You have a fever.  You keep throwing up (vomiting).  You feel weak, or you pass out (faint).  You feel confused.  You keep getting worse. This information is not intended to replace advice given to you by your health care provider. Make sure you discuss any questions you have with your health care provider. Document Released: 07/19/2011 Document Revised: 01/05/2016 Document Reviewed: 04/03/2013 Elsevier Interactive Patient Education  2017 [place].

## 2018-03-07 NOTE — Progress Notes (Signed)
Results for GERARDO, TERRITO (MRN [id number]) as of 03/07/2018 13:25  Ref. Range 03/06/2018 12:24 03/06/2018 17:02 03/06/2018 21:22 03/07/2018 08:48 03/07/2018 12:12  Glucose-Capillary Latest Ref Range: 70 - 99 mg/dL 185 (H) 446 (H) 234 (H) 194 (H) 304 (H)  Noted that postprandial blood sugars have been greater than 200 mg/dl. Recommend adding Novolog 4 units TID with meals if eating at least 50% of meal. Will continue to follow blood sugars while in the hospital.  Harvel Ricks RN BSN CDE Diabetes Coordinator Pager: [phone]  8am-5pm
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
## 2018-03-11 ENCOUNTER — Other Ambulatory Visit: Payer: Self-pay | Admitting: Family Medicine

## 2018-03-11 DIAGNOSIS — J328 Other chronic sinusitis: Secondary | ICD-10-CM

## 2018-03-11 LAB — CULTURE, BLOOD (ROUTINE X 2)
Culture: NO GROWTH
Culture: NO GROWTH

## 2018-03-11 MED FILL — hydrALAZINE HCL 100 MG TABS: 100 | 30 days supply | Qty: 90 | Fill #1

## 2018-03-11 MED FILL — ROSUVASTATIN CALCIUM 40 MG: 40 | 30 days supply | Qty: 30 | Fill #2

## 2018-03-11 MED FILL — GABAPENTIN 300 MG CAPSULE: 300 | 30 days supply | Qty: 90 | Fill #1

## 2018-03-11 MED FILL — SPIRONOLACTONE 50 MG TABLET: 50 | 30 days supply | Qty: 30 | Fill #3

## 2018-03-11 MED FILL — busPIRone HCL 5 MG TABS: 5 | 30 days supply | Qty: 90 | Fill #1

## 2018-03-11 MED FILL — LOSARTAN POTASSIUM 100 MG T: 100 | 30 days supply | Qty: 30 | Fill #1

## 2018-03-11 MED FILL — AMLODIPINE BESYLATE 10 MG T: 10 | 30 days supply | Qty: 30 | Fill #1

## 2018-03-14 MED FILL — TRUEPLUS PEN NDL 31G X 1/4: 31G X 6 MM | 30 days supply | Qty: 100 | Fill #2

## 2018-03-14 MED FILL — NovoLOG 100 UNIT/ML SOLN: 100 | 28 days supply | Qty: 10 | Fill #0

## 2018-03-14 MED FILL — LANTUS SOLOSTAR 100 UNITS/M: 100 | 30 days supply | Qty: 15 | Fill #1

## 2018-03-14 MED FILL — TRUEPLUS PEN NDL 31G X 1/4": 31G X 6 MM | 30 days supply | Qty: 100 | Fill #2

## 2018-03-17 ENCOUNTER — Other Ambulatory Visit: Payer: Self-pay | Admitting: Physician Assistant

## 2018-03-19 MED FILL — POTASSIUM CL 10 MEQ TAB SA: 10 | 30 days supply | Qty: 30 | Fill #0

## 2018-03-26 ENCOUNTER — Ambulatory Visit: Payer: Medicaid Other | Admitting: Physician Assistant

## 2018-03-26 NOTE — Progress Notes (Deleted)
Cardiology Office Note    Date:  03/26/2018   ID:  Brenda Sims, DOB [birth year], MRN [id number]  PCP:  Charlott Rakes, MD  Cardiologist:  Dr. Sallyanne Kuster   No chief complaint on file.   History of Present Illness:  Brenda Sims is a 62 y.o. female with PMH of CAD, history of PEA arrest in the setting of MI, obstructive sleep apnea not on CPAP, hypertension, hypothyroidism, DM 2 and history of LV dysfunction.  Patient first presented to the hospital on 09/03/2017 with respiratory failure and PEA arrest, he received CPR and had spontaneous return of normal sinus rhythm within 1 minute.  He was intubated at the time for acute respiratory failure and was successfully extubated on 09/06/2017.  Limited echocardiogram obtained on first arrival showed EF 45 to 50%, akinesis of the basal mid inferior myocardium, severe hypokinesis of the mid apical inferior lateral myocardium.  EEG showed diffuse background suppression but no seizure noted.  He eventually underwent cardiac catheterization on 09/11/2017 which showed 70% ostial ramus lesion, 65% proximal left circumflex lesion, 50% mid RCA lesion, EF 35 to 45%.  No culprit lesion was identified, medical therapy was recommended.  Heart failure therapy was started for her nonischemic cardiomyopathy.  She was readmitted in February 2019 was acute respiratory failure.  Cardiology was consulted at the time for right upper extremity pain.  Symptom and physical exam was inconsistent with pseudoaneurysm.  She was readmitted in March 2019 again with acute on chronic respiratory failure with combination of COPD exacerbation and acute heart failure.  She was treated for both.  Echocardiogram obtained on 10/20/2017 showed low normal EF of 50%, grade 2 DD.  Most recently, patient was admitted to the hospital in July 2019 for chest pain and COPD exacerbation.  She was placed on IV steroid, antibiotic as well as nebulizer with improvement.  Serial enzyme was negative.  Patient was  instructed to follow-up with cardiology as outpatient.  Her Lasix was temporarily held due to worsening renal function, this was resumed on discharge.    Past Medical History:  Diagnosis Date  . Anemia   . Anxiety   . CAD (coronary artery disease)   . CHF (congestive heart failure) ([place])   . CKD (chronic kidney disease)   . COPD (chronic obstructive pulmonary disease) ([place])   . Depression   . Diabetes mellitus without complication ([place])   . Hypertension   . Hypothyroid   . MI (myocardial infarction) ([place])   . Sleep apnea    no cpap , "never gave me one"    Past Surgical History:  Procedure Laterality Date  . APPENDECTOMY    . BACK SURGERY    . LEFT HEART CATH AND CORONARY ANGIOGRAPHY N/A 09/11/2017   Procedure: LEFT HEART CATH AND CORONARY ANGIOGRAPHY;  Surgeon: Martinique, Peter M, MD;  Location: [facility];  Service: Cardiovascular;  Laterality: N/A;  . TOTAL KNEE ARTHROPLASTY Right 2007    Current Medications: Outpatient Medications Prior to Visit  Medication Sig Dispense Refill  . ACCU-CHEK SOFTCLIX LANCETS lancets Use as instructed 100 each 12  . amLODipine (NORVASC) 10 MG tablet Take 1 tablet (10 mg total) by mouth daily. 30 tablet 3  . aspirin EC 81 MG tablet Take 1 tablet (81 mg total) by mouth daily. 150 tablet 0  . Blood Glucose Monitoring Suppl (ACCU-CHEK AVIVA) device Use as instructed 1 each 0  . Blood Glucose Monitoring Suppl (TRUE METRIX METER) DEVI 1 kit by Does not apply  route 4 (four) times daily. 1 Device 0  . busPIRone (BUSPAR) 5 MG tablet Take 1 tablet (5 mg total) by mouth 3 (three) times daily. 90 tablet 3  . carvedilol (COREG) 25 MG tablet TAKE 1 TABLET (25 MG TOTAL) BY MOUTH 2 (TWO) TIMES DAILY WITH A MEAL. 60 tablet 2  . cetirizine (ZYRTEC) 10 MG tablet TAKE 1 TABLET (10 MG TOTAL) BY MOUTH DAILY. 30 tablet 2  . FLUoxetine (PROZAC) 20 MG capsule Take 1 capsule (20 mg total) by mouth daily. 30 capsule 3  . furosemide (LASIX) 40 MG tablet Take 2  tablets (80 mg total) by mouth every morning AND 1 tablet (40 mg total) every evening. 270 tablet 3  . gabapentin (NEURONTIN) 300 MG capsule Take 1 capsule (300 mg total) by mouth 3 (three) times daily. 90 capsule 3  . glucose blood (ACCU-CHEK AVIVA) test strip Use as instructed 100 each 12  . glucose blood (TRUE METRIX BLOOD GLUCOSE TEST) test strip Use as instructed 120 each 12  . guaiFENesin-dextromethorphan (ROBITUSSIN DM) 100-10 MG/5ML syrup Take 5 mLs by mouth every 4 (four) hours as needed for cough. (Patient not taking: Reported on 03/05/2018) 118 mL 0  . hydrALAZINE (APRESOLINE) 100 MG tablet Take 1 tablet (100 mg total) by mouth 3 (three) times daily. 90 tablet 3  . insulin aspart (NOVOLOG) 100 UNIT/ML injection Inject 4 Units into the skin 3 (three) times daily with meals. 10 mL 3  . Insulin Glargine (LANTUS SOLOSTAR) 100 UNIT/ML Solostar Pen Inject 20 Units into the skin 2 (two) times daily. (Patient taking differently: Inject 20-80 Units into the skin See admin instructions. Use 80 units every morning then use 20 units at night) 5 pen 3  . Insulin Pen Needle (TRUEPLUS PEN NEEDLES) 31G X 6 MM MISC Use as directed 100 each 2  . ipratropium-albuterol (DUONEB) 0.5-2.5 (3) MG/3ML SOLN Take 3 mLs by nebulization every 6 (six) hours as needed. 360 mL 0  . ketoconazole (NIZORAL) 2 % shampoo Daily for the next week then twice a week until resolution (Patient not taking: Reported on 03/05/2018) 120 mL 0  . montelukast (SINGULAIR) 10 MG tablet Take 1 tablet (10 mg total) by mouth at bedtime. 30 tablet 3  . potassium chloride SA (K-DUR,KLOR-CON) 10 MEQ tablet Take 1 tablet (10 mEq total) by mouth daily. 30 tablet 3  . rosuvastatin (CRESTOR) 40 MG tablet Take 1 tablet (40 mg total) by mouth daily. 30 tablet 3  . spironolactone (ALDACTONE) 50 MG tablet Take 1 tablet (50 mg total) by mouth daily. (Patient not taking: Reported on 03/05/2018) 30 tablet 3  . TRUEPLUS LANCETS 28G MISC 28 g by Does not apply  route 4 (four) times daily. 120 each 3   No facility-administered medications prior to visit.      Allergies:   Eggs or egg-derived products; Azithromycin; and Cherry   Social History   Socioeconomic History  . Marital status: Legally Separated    Spouse name: Not on file  . Number of children: 0  . Years of education: Not on file  . Highest education level: Not on file  Occupational History  . Occupation: retired  Scientific laboratory technician  . Financial resource strain: Not on file  . Food insecurity:    Worry: Not on file    Inability: Not on file  . Transportation needs:    Medical: Not on file    Non-medical: Not on file  Tobacco Use  . Smoking status: Never Smoker  .  Smokeless tobacco: Never Used  Substance and Sexual Activity  . Alcohol use: No    Frequency: Never  . Drug use: No  . Sexual activity: Not on file  Lifestyle  . Physical activity:    Days per week: Not on file    Minutes per session: Not on file  . Stress: Not on file  Relationships  . Social connections:    Talks on phone: Not on file    Gets together: Not on file    Attends religious service: Not on file    Active member of club or organization: Not on file    Attends meetings of clubs or organizations: Not on file    Relationship status: Not on file  Other Topics Concern  . Not on file  Social History Narrative  . Not on file     Family History:  The patient's ***family history includes Hypertension in her mother.   ROS:   Please see the history of present illness.    ROS All other systems reviewed and are negative.   PHYSICAL EXAM:   VS:  There were no vitals taken for this visit.   GEN: Well nourished, well developed, in no acute distress  HEENT: normal  Neck: no JVD, carotid bruits, or masses Cardiac: ***RRR; no murmurs, rubs, or gallops,no edema  Respiratory:  clear to auscultation bilaterally, normal work of breathing GI: soft, nontender, nondistended, + BS MS: no deformity or atrophy    Skin: warm and dry, no rash Neuro:  Alert and Oriented x 3, Strength and sensation are intact Psych: euthymic mood, full affect  Wt Readings from Last 3 Encounters:  03/07/18 188 lb 9.6 oz (85.5 kg)  02/25/18 185 lb (83.9 kg)  02/24/18 185 lb (83.9 kg)      Studies/Labs Reviewed:   EKG:  EKG is*** ordered today.  The ekg ordered today demonstrates ***  Recent Labs: 09/03/2017: TSH 2.308 10/19/2017: Magnesium 1.5 01/21/2018: ALT 25 03/05/2018: B Natriuretic Peptide 32.4 03/07/2018: BUN 49; Creatinine, Ser 2.73; Hemoglobin 10.5; Platelets 298; Potassium 4.2; Sodium 143   Lipid Panel    Component Value Date/Time   CHOL 229 (H) 03/06/2018 0547   TRIG 218 (H) 03/06/2018 0547   HDL 29 (L) 03/06/2018 0547   CHOLHDL 7.9 03/06/2018 0547   VLDL 44 (H) 03/06/2018 0547   LDLCALC 156 (H) 03/06/2018 0547    Additional studies/ records that were reviewed today include:   Cath 09/11/2017  Ost Ramus to Ramus lesion is 70% stenosed.  Prox Cx to Mid Cx lesion is 65% stenosed.  Mid RCA to Dist RCA lesion is 50% stenosed.  LV end diastolic pressure is mildly elevated.  There is moderate left ventricular systolic dysfunction.  The left ventricular ejection fraction is 35-45% by visual estimate.   1. Moderate nonobstructive CAD 2. Moderate LV dysfunction. 3. Mildly elevated LVEDP  Plan: medical management.     Echo 02/26/2018 LV EF: 50% Study Conclusions  - Left ventricle: The cavity size was normal. Wall thickness was   increased in a pattern of mild LVH. Systolic function was normal.   The estimated ejection fraction was 50%. Hypokinesis of the   basalinferoseptal myocardium. Features are consistent with a   pseudonormal left ventricular filling pattern, with concomitant   abnormal relaxation and increased filling pressure (grade 2   diastolic dysfunction). - Aortic valve: Transvalvular velocity was within the normal range.   There was no stenosis. There was no  regurgitation. - Mitral valve: There  was trivial regurgitation. Valve area by   continuity equation (using LVOT flow): 2.11 cm^2. - Left atrium: The atrium was severely dilated. - Right ventricle: Systolic function was normal. - Right atrium: The atrium was moderately dilated. - Tricuspid valve: There was trivial regurgitation. - Pulmonic valve: There was no significant regurgitation.  Impressions:  - Echo largely unchanged from prior, MR appears less significant on   this study.    ASSESSMENT:    No diagnosis found.   PLAN:  In order of problems listed above:  1. ***    Medication Adjustments/Labs and Tests Ordered: Current medicines are reviewed at length with the patient today.  Concerns regarding medicines are outlined above.  Medication changes, Labs and Tests ordered today are listed in the Patient Instructions below. There are no Patient Instructions on file for this visit.   Hilbert Corrigan, [place]  03/26/2018 8:12 AM    [address], [place], [place]  [address] Phone: [phone]; Fax: [phone]

## 2018-03-27 ENCOUNTER — Encounter: Payer: Self-pay | Admitting: *Deleted

## 2018-03-27 MED FILL — FLUoxetine HCL 20 MG CAPS: 20 | 30 days supply | Qty: 30 | Fill #0

## 2018-03-28 MED FILL — CARVEDILOL 25 MG TABLET: 25 | 30 days supply | Qty: 60 | Fill #1

## 2018-03-31 MED FILL — GRISEOFULVIN MICRO 500 MG T: 500 | 30 days supply | Qty: 30 | Fill #0

## 2018-04-01 ENCOUNTER — Encounter: Payer: Medicaid Other | Admitting: Family Medicine

## 2018-04-11 ENCOUNTER — Other Ambulatory Visit: Payer: Self-pay | Admitting: Physician Assistant

## 2018-04-11 MED FILL — GABAPENTIN 300 MG CAPSULE: 300 | 30 days supply | Qty: 90 | Fill #2

## 2018-04-11 MED FILL — MONTELUKAST SOD 10 MG TAB: 10 | 30 days supply | Qty: 30 | Fill #1

## 2018-04-11 MED FILL — POTASSIUM CL 10 MEQ TAB SA: 10 | 30 days supply | Qty: 30 | Fill #1

## 2018-04-11 MED FILL — LOSARTAN POTASSIUM 100 MG T: 100 | 30 days supply | Qty: 30 | Fill #2

## 2018-04-11 MED FILL — hydrALAZINE HCL 100 MG TABS: 100 | 30 days supply | Qty: 90 | Fill #2

## 2018-04-11 MED FILL — ROSUVASTATIN CALCIUM 40 MG: 40 | 30 days supply | Qty: 30 | Fill #3

## 2018-04-11 MED FILL — SPIRONOLACTONE 50 MG TABLET: 50 | 30 days supply | Qty: 30 | Fill #0

## 2018-04-18 ENCOUNTER — Ambulatory Visit (INDEPENDENT_AMBULATORY_CARE_PROVIDER_SITE_OTHER): Payer: Medicaid Other | Admitting: Cardiology

## 2018-04-18 ENCOUNTER — Encounter: Payer: Self-pay | Admitting: Cardiology

## 2018-04-18 VITALS — BP 107/66 | HR 85 | Ht 64.5 in | Wt 191.6 lb

## 2018-04-18 DIAGNOSIS — N183 Chronic kidney disease, stage 3 unspecified: Secondary | ICD-10-CM

## 2018-04-18 DIAGNOSIS — I5043 Acute on chronic combined systolic (congestive) and diastolic (congestive) heart failure: Secondary | ICD-10-CM | POA: Diagnosis not present

## 2018-04-18 DIAGNOSIS — G4719 Other hypersomnia: Secondary | ICD-10-CM

## 2018-04-18 DIAGNOSIS — G4739 Other sleep apnea: Secondary | ICD-10-CM

## 2018-04-18 DIAGNOSIS — E1122 Type 2 diabetes mellitus with diabetic chronic kidney disease: Secondary | ICD-10-CM

## 2018-04-18 DIAGNOSIS — R0683 Snoring: Secondary | ICD-10-CM

## 2018-04-18 DIAGNOSIS — I2510 Atherosclerotic heart disease of native coronary artery without angina pectoris: Secondary | ICD-10-CM

## 2018-04-18 DIAGNOSIS — G4700 Insomnia, unspecified: Secondary | ICD-10-CM

## 2018-04-18 DIAGNOSIS — J441 Chronic obstructive pulmonary disease with (acute) exacerbation: Secondary | ICD-10-CM

## 2018-04-18 DIAGNOSIS — Z794 Long term (current) use of insulin: Secondary | ICD-10-CM

## 2018-04-18 NOTE — Progress Notes (Signed)
Thanks MCr 
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
## 2018-04-18 NOTE — Patient Instructions (Addendum)
Medication Instructions:  Your physician recommends that you continue on your current medications as directed. Please refer to the Current Medication list given to you today.  If you need a refill on your cardiac medications before your next appointment, please call your pharmacy.  Labwork: Your physician recommends that you return for lab work in: [facility], [place] in our office  Testing/Procedures: Your physician has recommended that you have a sleep study. This test records several body functions during sleep, including: brain activity, eye movement, oxygen and carbon dioxide blood levels, heart rate and rhythm, breathing rate and rhythm, the flow of air through your mouth and nose, snoring, body muscle movements, and chest and belly movement. OUR SLEEP COORDINATOR WILL BE IN CONTACT WITH YOU  Follow-Up: FOLLOW UP AS SCHEDULED WITH DR Sallyanne Kuster  Any Other Special Instructions Will Be Listed Below (If Applicable).

## 2018-04-19 LAB — CBC
Hematocrit: 32.6 % — ABNORMAL LOW (ref 34.0–46.6)
Hemoglobin: 10.4 g/dL — ABNORMAL LOW (ref 11.1–15.9)
MCH: 25.2 pg — ABNORMAL LOW (ref 26.6–33.0)
MCHC: 31.9 g/dL (ref 31.5–35.7)
MCV: 79 fL (ref 79–97)
Platelets: 327 10*3/uL (ref 150–450)
RBC: 4.13 x10E6/uL (ref 3.77–5.28)
RDW: 18 % — ABNORMAL HIGH (ref 12.3–15.4)
WBC: 15.2 10*3/uL — ABNORMAL HIGH (ref 3.4–10.8)

## 2018-04-19 LAB — BASIC METABOLIC PANEL
BUN/Creatinine Ratio: 13 (ref 12–28)
BUN: 28 mg/dL — ABNORMAL HIGH (ref 8–27)
CO2: 25 mmol/L (ref 20–29)
Calcium: 9.5 mg/dL (ref 8.7–10.3)
Chloride: 105 mmol/L (ref 96–106)
Creatinine, Ser: 2.15 mg/dL — ABNORMAL HIGH (ref 0.57–1.00)
GFR calc Af Amer: 28 mL/min/{1.73_m2} — ABNORMAL LOW (ref >59)
GFR calc non Af Amer: 24 mL/min/{1.73_m2} — ABNORMAL LOW (ref >59)
Glucose: 138 mg/dL — ABNORMAL HIGH (ref 65–99)
Potassium: 3.9 mmol/L (ref 3.5–5.2)
Sodium: 147 mmol/L — ABNORMAL HIGH (ref 134–144)

## 2018-04-21 ENCOUNTER — Telehealth: Payer: Self-pay | Admitting: Cardiology

## 2018-04-21 ENCOUNTER — Telehealth: Payer: Self-pay | Admitting: *Deleted

## 2018-04-21 NOTE — Telephone Encounter (Signed)
Attempted to contact patient to give lab work, patient did not answer. LVM advising patient to return call to discuss, left call back number.

## 2018-04-21 NOTE — Telephone Encounter (Signed)
    Patient calling for lab results 

## 2018-04-21 NOTE — Telephone Encounter (Signed)
-----   Message from [name] sent at 04/18/2018  3:21 PM EDT ----- Regarding: sleep study Brenda Sims ordered sleep study for this patient.

## 2018-04-21 NOTE — Telephone Encounter (Signed)
Labs pending provider review. Routed to PA/CMA

## 2018-04-21 NOTE — Telephone Encounter (Signed)
Patient notified of sleep study scheduled on 05/11/18 @[facility].

## 2018-04-22 MED FILL — CETIRIZINE HCL 10 MG TABLET: 10 | 30 days supply | Qty: 30 | Fill #0

## 2018-04-22 NOTE — Telephone Encounter (Signed)
Left message advising patient to contact office.

## 2018-04-24 MED FILL — ACCU-CHEK AVIVA PLUS METER: W/DEVICE | 1 days supply | Qty: 1 | Fill #0

## 2018-04-24 MED FILL — ACCU-CHEK AVIVA PLUS TEST S: 25 days supply | Qty: 100 | Fill #4

## 2018-04-28 MED FILL — AMLODIPINE BESYLATE 10 MG T: 10 | 30 days supply | Qty: 30 | Fill #2

## 2018-04-28 MED FILL — CARVEDILOL 25 MG TABLET: 25 | 30 days supply | Qty: 60 | Fill #2

## 2018-04-29 NOTE — Telephone Encounter (Signed)
Patient notified of lab results on 04/23/2018. See lab results.

## 2018-05-05 MED FILL — SUPREP BOWEL PREP KIT: 17.5-3.13-1 | 1 days supply | Qty: 354 | Fill #0

## 2018-05-06 ENCOUNTER — Encounter (HOSPITAL_COMMUNITY): Payer: Self-pay | Admitting: Anesthesiology

## 2018-05-06 ENCOUNTER — Telehealth: Payer: Self-pay | Admitting: Internal Medicine

## 2018-05-06 ENCOUNTER — Emergency Department (HOSPITAL_COMMUNITY)
Admission: EM | Admit: 2018-05-06 | Discharge: 2018-05-07 | Disposition: A | Discharge status: Home / Self Care | Payer: Medicaid Other | Attending: Emergency Medicine | Admitting: Emergency Medicine

## 2018-05-06 ENCOUNTER — Ambulatory Visit (HOSPITAL_COMMUNITY): Admission: RE | Admit: 2018-05-06 | Payer: Medicaid Other | Source: Ambulatory Visit | Admitting: Internal Medicine

## 2018-05-06 ENCOUNTER — Other Ambulatory Visit: Payer: Self-pay

## 2018-05-06 ENCOUNTER — Encounter (HOSPITAL_COMMUNITY): Payer: Self-pay | Admitting: Emergency Medicine

## 2018-05-06 ENCOUNTER — Telehealth: Payer: Self-pay | Admitting: Cardiovascular Disease

## 2018-05-06 ENCOUNTER — Encounter (HOSPITAL_COMMUNITY): Admission: RE | Payer: Self-pay | Source: Ambulatory Visit

## 2018-05-06 DIAGNOSIS — Z794 Long term (current) use of insulin: Secondary | ICD-10-CM | POA: Insufficient documentation

## 2018-05-06 DIAGNOSIS — J449 Chronic obstructive pulmonary disease, unspecified: Secondary | ICD-10-CM | POA: Diagnosis not present

## 2018-05-06 DIAGNOSIS — I252 Old myocardial infarction: Secondary | ICD-10-CM | POA: Diagnosis not present

## 2018-05-06 DIAGNOSIS — E1122 Type 2 diabetes mellitus with diabetic chronic kidney disease: Secondary | ICD-10-CM | POA: Diagnosis not present

## 2018-05-06 DIAGNOSIS — E785 Hyperlipidemia, unspecified: Secondary | ICD-10-CM | POA: Diagnosis not present

## 2018-05-06 DIAGNOSIS — Z79899 Other long term (current) drug therapy: Secondary | ICD-10-CM | POA: Insufficient documentation

## 2018-05-06 DIAGNOSIS — R1084 Generalized abdominal pain: Secondary | ICD-10-CM | POA: Diagnosis not present

## 2018-05-06 DIAGNOSIS — E039 Hypothyroidism, unspecified: Secondary | ICD-10-CM | POA: Insufficient documentation

## 2018-05-06 DIAGNOSIS — N183 Chronic kidney disease, stage 3 (moderate): Secondary | ICD-10-CM | POA: Insufficient documentation

## 2018-05-06 DIAGNOSIS — I130 Hypertensive heart and chronic kidney disease with heart failure and stage 1 through stage 4 chronic kidney disease, or unspecified chronic kidney disease: Secondary | ICD-10-CM | POA: Diagnosis not present

## 2018-05-06 DIAGNOSIS — I2510 Atherosclerotic heart disease of native coronary artery without angina pectoris: Secondary | ICD-10-CM | POA: Insufficient documentation

## 2018-05-06 DIAGNOSIS — R531 Weakness: Secondary | ICD-10-CM

## 2018-05-06 DIAGNOSIS — I5042 Chronic combined systolic (congestive) and diastolic (congestive) heart failure: Secondary | ICD-10-CM | POA: Insufficient documentation

## 2018-05-06 DIAGNOSIS — Z7982 Long term (current) use of aspirin: Secondary | ICD-10-CM | POA: Diagnosis not present

## 2018-05-06 LAB — URINALYSIS, ROUTINE W REFLEX MICROSCOPIC
Bilirubin Urine: NEGATIVE
Glucose, UA: NEGATIVE mg/dL
Hgb urine dipstick: NEGATIVE
KETONES UR: NEGATIVE mg/dL
LEUKOCYTES UA: NEGATIVE
NITRITE: NEGATIVE
Protein, ur: NEGATIVE mg/dL
Specific Gravity, Urine: 1.01 (ref 1.005–1.030)
pH: 5 (ref 5.0–8.0)

## 2018-05-06 LAB — CBC
HCT: 30.3 % — ABNORMAL LOW (ref 36.0–46.0)
Hemoglobin: 9.8 g/dL — ABNORMAL LOW (ref 12.0–15.0)
MCH: 25.7 pg — ABNORMAL LOW (ref 26.0–34.0)
MCHC: 32.3 g/dL (ref 30.0–36.0)
MCV: 79.5 fL (ref 78.0–100.0)
Platelets: 282 10*3/uL (ref 150–400)
RBC: 3.81 MIL/uL — ABNORMAL LOW (ref 3.87–5.11)
RDW: 18.8 % — AB (ref 11.5–15.5)
WBC: 16.5 10*3/uL — AB (ref 4.0–10.5)

## 2018-05-06 SURGERY — ESOPHAGOGASTRODUODENOSCOPY (EGD) WITH PROPOFOL
Anesthesia: Monitor Anesthesia Care

## 2018-05-06 MED ORDER — PROPOFOL 10 MG/ML IV BOLUS
INTRAVENOUS | Status: AC
  Filled 2018-05-06: qty 20

## 2018-05-06 NOTE — Anesthesia Preprocedure Evaluation (Deleted)
Anesthesia Evaluation  Patient identified by MRN, date of birth, ID band Patient awake    Reviewed: Allergy & Precautions, NPO status , Patient's Chart, lab work & pertinent test results  Airway Mallampati: II  TM Distance: >3 FB     Dental   Pulmonary sleep apnea , COPD,    breath sounds clear to auscultation       Cardiovascular hypertension, + angina + CAD, + Past MI and +CHF   Rhythm:Regular Rate:Normal     Neuro/Psych Seizures -,     GI/Hepatic negative GI ROS, Neg liver ROS,   Endo/Other  diabetesHypothyroidism   Renal/GU Renal disease     Musculoskeletal   Abdominal   Peds  Hematology  (+) anemia ,   Anesthesia Other Findings   Reproductive/Obstetrics                             Anesthesia Physical Anesthesia Plan  ASA: III  Anesthesia Plan: MAC   Post-op Pain Management:    Induction: Intravenous  PONV Risk Score and Plan: Propofol infusion and Midazolam  Airway Management Planned: Simple Face Mask and Nasal Cannula  Additional Equipment:   Intra-op Plan:   Post-operative Plan:   Informed Consent: I have reviewed the patients History and Physical, chart, labs and discussed the procedure including the risks, benefits and alternatives for the proposed anesthesia with the patient or authorized representative who has indicated his/her understanding and acceptance.   Dental advisory given  Plan Discussed with: CRNA and Anesthesiologist  Anesthesia Plan Comments:         Anesthesia Quick Evaluation

## 2018-05-06 NOTE — Telephone Encounter (Signed)
Called patient, she states the medication she is referring to is the medication they gave her for her Colonoscopy and that she needed to reschedule. Patient was advised to contact her GI, Dr.Perry and they could reschedule her and discuss this medication with her as well. Patient was given their number and had no further questions.

## 2018-05-06 NOTE — Telephone Encounter (Signed)
New Message:   Patient calling because she is really sick. Patient took some medication that seems to making her sicker. Patient also stated that she called about 4 am this morning. Please call patient back.

## 2018-05-06 NOTE — ED Triage Notes (Signed)
Pt c/o generalized weakness and abdominal pain after taking colonoscopy cleanse last night. Pt did not have colonoscopy today because she didn't feel well. Denies chest pain/shortness of breath.

## 2018-05-06 NOTE — Telephone Encounter (Signed)
Patient did not show for ECL this am at [facility]. Staff contacted patient who apparently had some prep issues and decided not to show. No indication that she contacted our office. Instructed by endoscopy nurse to call our office to reschedule

## 2018-05-07 ENCOUNTER — Emergency Department (HOSPITAL_COMMUNITY): Payer: Medicaid Other

## 2018-05-07 LAB — COMPREHENSIVE METABOLIC PANEL
ALT: 16 U/L (ref 0–44)
AST: 22 U/L (ref 15–41)
Albumin: 3.4 g/dL — ABNORMAL LOW (ref 3.5–5.0)
Alkaline Phosphatase: 65 U/L (ref 38–126)
Anion gap: 17 — ABNORMAL HIGH (ref 5–15)
BUN: 33 mg/dL — ABNORMAL HIGH (ref 8–23)
CHLORIDE: 102 mmol/L (ref 98–111)
CO2: 21 mmol/L — AB (ref 22–32)
Calcium: 9.4 mg/dL (ref 8.9–10.3)
Creatinine, Ser: 2.32 mg/dL — ABNORMAL HIGH (ref 0.44–1.00)
GFR, EST AFRICAN AMERICAN: 25 mL/min — AB (ref >60)
GFR, EST NON AFRICAN AMERICAN: 21 mL/min — AB (ref >60)
Glucose, Bld: 179 mg/dL — ABNORMAL HIGH (ref 70–99)
POTASSIUM: 3.3 mmol/L — AB (ref 3.5–5.1)
SODIUM: 140 mmol/L (ref 135–145)
Total Bilirubin: 0.5 mg/dL (ref 0.3–1.2)
Total Protein: 7.4 g/dL (ref 6.5–8.1)

## 2018-05-07 LAB — TROPONIN I

## 2018-05-07 LAB — LIPASE, BLOOD: LIPASE: 47 U/L (ref 11–51)

## 2018-05-07 MED ORDER — HYOSCYAMINE SULFATE 0.125 MG SL SUBL
0.1250 mg | SUBLINGUAL_TABLET | Freq: Once | SUBLINGUAL | Status: AC
  Administered 2018-05-07: 0.125 mg via ORAL
  Filled 2018-05-07: qty 1

## 2018-05-07 MED ORDER — HYOSCYAMINE SULFATE 0.5 MG/ML IJ SOLN
0.1250 mg | Freq: Once | INTRAMUSCULAR | Status: DC
  Filled 2018-05-07: qty 0.25

## 2018-05-07 MED ORDER — SODIUM CHLORIDE 0.9 % IV SOLN
Freq: Once | INTRAVENOUS | Status: AC
  Administered 2018-05-07: 03:00:00 via INTRAVENOUS

## 2018-05-07 MED ORDER — SODIUM CHLORIDE 0.9 % IV BOLUS
500.0000 mL | Freq: Once | INTRAVENOUS | Status: AC
  Administered 2018-05-07: 500 mL via INTRAVENOUS

## 2018-05-07 MED ORDER — HYOSCYAMINE SULFATE SL 0.125 MG SL SUBL: 1.0000 | SUBLINGUAL_TABLET | SUBLINGUAL | 0 refills | Status: AC | PRN

## 2018-05-07 NOTE — ED Notes (Signed)
Patient's O2 sats started to drop 87% while sleeping. Patient states "I'm suppose to have a sleep study done because that happens sometimes".   Placed on 2L nasal cannula.

## 2018-05-07 NOTE — ED Provider Notes (Signed)
[facility] EMERGENCY DEPARTMENT Provider Note   CSN: [id number] Arrival date & time: 05/06/18  2251     History   Chief Complaint Chief Complaint  Patient presents with  . Weakness  . Abdominal Pain    HPI Brenda Sims is a 62 y.o. female.  The patient presents with c/o generalized, progressive weakness since Monday (05/05/18). She was scheduled for a colonoscopy yesterday morning for evaluation of anemia and felt per her usual state of health when she started the bowel prep on Monday evening. She reports having the expected response of bowel movements through the night and reports she had some generalized abdominal cramping with that. No bloody stools. No nausea or vomiting. She states that by morning yesterday she was significantly weak with continued abdominal cramping that make her feel she needs to have a bowel movement but reports decreased stools. She denies abdominal pain in between episodes of cramping. She cancelled her procedure due to weakness. No fever, chest pain, SOB or urinary symptoms. She states her blood sugar was found to be "low" at 60 which was corrected with a single soda and has been normal since. She is here tonight for evaluation and management of persistent weakness and abdominal cramping.   The history is provided by the patient and the spouse. No language interpreter was used.  Weakness  Pertinent negatives include no vomiting and no headaches.  Abdominal Pain   Pertinent negatives include fever, nausea, vomiting and headaches.    Past Medical History:  Diagnosis Date  . Anemia   . Anxiety   . CAD (coronary artery disease)   . CHF (congestive heart failure) ([place])   . CKD (chronic kidney disease)   . COPD (chronic obstructive pulmonary disease) ([place])   . Depression   . Diabetes mellitus without complication ([place])   . Hypertension   . Hypothyroid   . MI (myocardial infarction) ([place])   . Sleep apnea    no cpap , "never gave me  one"    Patient Active Problem List   Diagnosis Date Noted  . COPD exacerbation ([place]) 03/05/2018  . CAD (coronary artery disease) 03/05/2018  . Iron deficiency anemia 02/26/2018  . Sleep apnea 11/01/2017  . HLD (hyperlipidemia) 10/19/2017  . Anxiety and depression 10/19/2017  . Chest pain 10/19/2017  . Hypoglycemia   . Hypoxia   . Bronchitis 10/07/2017  . Chronic combined systolic and diastolic CHF (congestive heart failure) ([place]) 10/07/2017  . Acute renal failure superimposed on stage 3 chronic kidney disease ([place]) 10/07/2017  . Hypokalemia 10/07/2017  . Normocytic normochromic anemia 10/07/2017  . Type II diabetes mellitus with renal manifestations ([place])   . Leukocytosis   . Acute blood loss anemia   . Seizures ([place])   . Coronary artery disease involving native coronary artery of native heart with angina pectoris ([place]) 09/05/2017  . History of cardiac arrest 09/03/2017  . Cardiopulmonary arrest ([place])   . Acute encephalopathy   . Acute respiratory failure with hypoxia ([place])   . Acute on chronic combined systolic and diastolic CHF (congestive heart failure) ([place]) 08/29/2017  . Essential hypertension 08/29/2017  . Type 1 diabetes mellitus without complication ([place]) 19/41/7408  . Tobacco abuse 08/29/2017  . Acute pulmonary edema ([facility])   . CKD (chronic kidney disease)   . Dyspnea on exertion     Past Surgical History:  Procedure Laterality Date  . APPENDECTOMY    . BACK SURGERY    . LEFT HEART CATH AND CORONARY  ANGIOGRAPHY N/A 09/11/2017   Procedure: LEFT HEART CATH AND CORONARY ANGIOGRAPHY;  Surgeon: Martinique, Peter M, MD;  Location: [facility];  Service: Cardiovascular;  Laterality: N/A;  . TOTAL KNEE ARTHROPLASTY Right 2007     OB History   None      Home Medications    Prior to Admission medications   Medication Sig Start Date End Date Taking? Authorizing Provider  ACCU-CHEK SOFTCLIX LANCETS lancets Use as instructed 10/16/17   Brayton Caves, PA-C    amLODipine (NORVASC) 10 MG tablet Take 1 tablet (10 mg total) by mouth daily. 01/21/18   Charlott Rakes, MD  aspirin EC 81 MG tablet Take 1 tablet (81 mg total) by mouth daily. 10/16/17   Brayton Caves, PA-C  Blood Glucose Monitoring Suppl (ACCU-CHEK AVIVA) device Use as instructed 10/16/17 10/16/18  Brayton Caves, PA-C  Blood Glucose Monitoring Suppl (TRUE METRIX METER) DEVI 1 kit by Does not apply route 4 (four) times daily. 10/16/17   Brayton Caves, PA-C  busPIRone (BUSPAR) 5 MG tablet Take 1 tablet (5 mg total) by mouth 3 (three) times daily. 01/21/18   Charlott Rakes, MD  carvedilol (COREG) 25 MG tablet TAKE 1 TABLET (25 MG TOTAL) BY MOUTH 2 (TWO) TIMES DAILY WITH A MEAL. 02/25/18   Charlott Rakes, MD  cetirizine (ZYRTEC) 10 MG tablet TAKE 1 TABLET (10 MG TOTAL) BY MOUTH DAILY. 03/11/18   Charlott Rakes, MD  furosemide (LASIX) 40 MG tablet Take 2 tablets (80 mg total) by mouth every morning AND 1 tablet (40 mg total) every evening. 02/24/18 02/19/19  Croitoru, Mihai, MD  gabapentin (NEURONTIN) 300 MG capsule Take 1 capsule (300 mg total) by mouth 3 (three) times daily. 01/21/18   Charlott Rakes, MD  glucose blood (ACCU-CHEK AVIVA) test strip Use as instructed 10/16/17   Brayton Caves, PA-C  glucose blood (TRUE METRIX BLOOD GLUCOSE TEST) test strip Use as instructed 10/16/17   Brayton Caves, PA-C  guaiFENesin-dextromethorphan (ROBITUSSIN DM) 100-10 MG/5ML syrup Take 5 mLs by mouth every 4 (four) hours as needed for cough. 10/08/17   Mikhail, Velta Addison, DO  hydrALAZINE (APRESOLINE) 100 MG tablet Take 1 tablet (100 mg total) by mouth 3 (three) times daily. 01/21/18   Charlott Rakes, MD  insulin aspart (NOVOLOG) 100 UNIT/ML injection Inject 4 Units into the skin 3 (three) times daily with meals. 01/21/18   Charlott Rakes, MD  Insulin Glargine (LANTUS SOLOSTAR) 100 UNIT/ML Solostar Pen Inject 20 Units into the skin 2 (two) times daily. Patient taking differently: Inject 20-80 Units into the skin See admin  instructions. Use 80 units every morning then use 20 units at night 01/21/18   Charlott Rakes, MD  Insulin Pen Needle (TRUEPLUS PEN NEEDLES) 31G X 6 MM MISC Use as directed 11/08/17   Charlott Rakes, MD  ipratropium-albuterol (DUONEB) 0.5-2.5 (3) MG/3ML SOLN Take 3 mLs by nebulization every 6 (six) hours as needed. 03/07/18   Kayleen Memos, DO  ketoconazole (NIZORAL) 2 % shampoo Daily for the next week then twice a week until resolution 02/17/18   Augusto Gamble B, NP  montelukast (SINGULAIR) 10 MG tablet Take 1 tablet (10 mg total) by mouth at bedtime. 01/21/18   Charlott Rakes, MD  potassium chloride SA (K-DUR,KLOR-CON) 10 MEQ tablet Take 1 tablet (10 mEq total) by mouth daily. 01/21/18   Charlott Rakes, MD  rosuvastatin (CRESTOR) 40 MG tablet Take 1 tablet (40 mg total) by mouth daily. 01/21/18 04/21/18  Charlott Rakes, MD  spironolactone (ALDACTONE) 50 MG tablet Take 1 tablet (50 mg total) by mouth daily. 01/21/18   Charlott Rakes, MD  TRUEPLUS LANCETS 28G MISC 28 g by Does not apply route 4 (four) times daily. 10/16/17   Brayton Caves, PA-C    Family History Family History  Problem Relation Age of Onset  . Hypertension Mother   . Colon cancer Neg Hx   . Esophageal cancer Neg Hx     Social History Social History   Tobacco Use  . Smoking status: Never Smoker  . Smokeless tobacco: Never Used  Substance Use Topics  . Alcohol use: No    Frequency: Never  . Drug use: No     Allergies   Eggs or egg-derived products; Azithromycin; and Cherry   Review of Systems Review of Systems  Constitutional: Negative for chills and fever.  HENT: Negative.   Respiratory: Negative.   Cardiovascular: Negative.   Gastrointestinal: Positive for abdominal pain. Negative for nausea and vomiting.       See HPI.  Genitourinary: Negative.   Musculoskeletal: Negative.   Skin: Negative.   Neurological: Positive for weakness. Negative for syncope and headaches.     Physical Exam Updated Vital  Signs BP 139/62 (BP Location: Right Arm)   Pulse 72   Temp 99.2 F (37.3 C)   Resp 17   SpO2 90%   Physical Exam  Constitutional: She appears well-developed and well-nourished.  HENT:  Head: Normocephalic.  Neck: Normal range of motion. Neck supple.  Cardiovascular: Normal rate and regular rhythm.  No murmur heard. Pulmonary/Chest: Effort normal and breath sounds normal. She has no wheezes. She has no rhonchi. She has no rales.  Abdominal: Soft. Bowel sounds are normal. There is no tenderness. There is no rebound and no guarding.  Musculoskeletal: Normal range of motion.  Neurological: She is alert. No cranial nerve deficit.  Skin: Skin is warm and dry. No rash noted.  Psychiatric: She has a normal mood and affect.  Nursing note and vitals reviewed.    ED Treatments / Results  Labs (all labs ordered are listed, but only abnormal results are displayed) Labs Reviewed  COMPREHENSIVE METABOLIC PANEL - Abnormal; Notable for the following components:      Result Value   Potassium 3.3 (*)    CO2 21 (*)    Glucose, Bld 179 (*)    BUN 33 (*)    Creatinine, Ser 2.32 (*)    Albumin 3.4 (*)    GFR calc non Af Amer 21 (*)    GFR calc Af Amer 25 (*)    Anion gap 17 (*)    All other components within normal limits  CBC - Abnormal; Notable for the following components:   WBC 16.5 (*)    RBC 3.81 (*)    Hemoglobin 9.8 (*)    HCT 30.3 (*)    MCH 25.7 (*)    RDW 18.8 (*)    All other components within normal limits  URINALYSIS, ROUTINE W REFLEX MICROSCOPIC - Abnormal; Notable for the following components:   APPearance HAZY (*)    All other components within normal limits  LIPASE, BLOOD    EKG None  Radiology No results found.  Procedures Procedures (including critical care time)  Medications Ordered in ED Medications - No data to display   Initial Impression / Assessment and Plan / ED Course  I have reviewed the triage vital signs and the nursing notes.  Pertinent  labs & imaging results that  were available during my care of the patient were reviewed by me and considered in my medical decision making (see chart for details).     Patient here for generalized weakness and abdominal cramping, persistent since doing a bowel prep for scheduled colonoscopy. The procedure was cancelled. No fever, bloody stools, vomiting.   She is given light hydration for weakness. Hyoscyamine for relief of cramping which does provide relief. The patient is sleeping comfortably on re-examination. VSS. She has a mild leukocystosis which is usually present on chart review. Imaging of abdomen shows stool remains in the colon without dilatation. No pulmonary edema.  She can be discharged home with close PCP follow up in 1-2 days. Levsin by Rx for home use.   Final Clinical Impressions(s) / ED Diagnoses   Final diagnoses:  None   1. Abdominal cramping 2. Generalized weakness  ED Discharge Orders    None       Charlann Lange, PA-C 05/07/18 2774    Fatima Blank, MD 05/07/18 [id number]

## 2018-05-07 NOTE — ED Notes (Addendum)
PT asleep. found with baby behind bed unplugged from everything. Baby not placed in monitor as well

## 2018-05-07 NOTE — Discharge Instructions (Signed)
Follow up with your doctor for recheck in 1-2 days to insure symptoms are improving. You should reschedule your colonoscopy as soon as you are able. Return to the ED with any worsening symptoms or new concern.

## 2018-05-07 NOTE — ED Notes (Signed)
Patient verbalizes understanding of medications and discharge instructions. No further questions at this time. VSS and patient ambulatory at discharge.   

## 2018-05-11 ENCOUNTER — Ambulatory Visit (HOSPITAL_BASED_OUTPATIENT_CLINIC_OR_DEPARTMENT_OTHER): Payer: Medicaid Other | Attending: Cardiology | Admitting: Cardiovascular Disease

## 2018-05-11 DIAGNOSIS — E1122 Type 2 diabetes mellitus with diabetic chronic kidney disease: Secondary | ICD-10-CM | POA: Diagnosis not present

## 2018-05-11 DIAGNOSIS — G4761 Periodic limb movement disorder: Secondary | ICD-10-CM | POA: Diagnosis not present

## 2018-05-11 DIAGNOSIS — Z794 Long term (current) use of insulin: Secondary | ICD-10-CM | POA: Diagnosis not present

## 2018-05-11 DIAGNOSIS — I5043 Acute on chronic combined systolic (congestive) and diastolic (congestive) heart failure: Secondary | ICD-10-CM | POA: Insufficient documentation

## 2018-05-11 DIAGNOSIS — N183 Chronic kidney disease, stage 3 (moderate): Secondary | ICD-10-CM | POA: Insufficient documentation

## 2018-05-11 DIAGNOSIS — G4733 Obstructive sleep apnea (adult) (pediatric): Secondary | ICD-10-CM

## 2018-05-11 DIAGNOSIS — R0683 Snoring: Secondary | ICD-10-CM | POA: Diagnosis present

## 2018-05-11 DIAGNOSIS — I493 Ventricular premature depolarization: Secondary | ICD-10-CM | POA: Insufficient documentation

## 2018-05-11 DIAGNOSIS — Z7982 Long term (current) use of aspirin: Secondary | ICD-10-CM | POA: Diagnosis not present

## 2018-05-11 DIAGNOSIS — G4719 Other hypersomnia: Secondary | ICD-10-CM | POA: Diagnosis present

## 2018-05-11 DIAGNOSIS — Z79899 Other long term (current) drug therapy: Secondary | ICD-10-CM | POA: Diagnosis not present

## 2018-05-11 DIAGNOSIS — G4739 Other sleep apnea: Secondary | ICD-10-CM | POA: Diagnosis present

## 2018-05-12 NOTE — Telephone Encounter (Signed)
Okay.  We will need to see what prep she had previously so that we might make adjustments.  Thanks

## 2018-05-12 NOTE — Telephone Encounter (Signed)
You can find out what, if any, issues she had and if she wishes to reschedule her examinations, or not.  Keep me posted.  Thanks

## 2018-05-12 NOTE — Telephone Encounter (Signed)
Do you want me to contact the pt to reschedule colon or wait for her to call.

## 2018-05-12 NOTE — Telephone Encounter (Signed)
Called pt and she reports the prep made her sick and weak. She was seen in the ER that day. Pt would like to rescheduled procedure. Let her know we will contact her when next available hospital scheduled comes out.

## 2018-05-13 MED FILL — LANTUS SOLOSTAR 100 UNITS/M: 100 | 30 days supply | Qty: 15 | Fill #2

## 2018-05-13 MED FILL — hydrALAZINE HCL 100 MG TABS: 100 | 30 days supply | Qty: 90 | Fill #0

## 2018-05-13 MED FILL — LOSARTAN POTASSIUM 100 MG T: 100 | 30 days supply | Qty: 30 | Fill #3

## 2018-05-13 MED FILL — FLUoxetine HCL 20 MG CAPS: 20 | 30 days supply | Qty: 30 | Fill #1

## 2018-05-13 MED FILL — MONTELUKAST SOD 10 MG TAB: 10 | 30 days supply | Qty: 30 | Fill #2

## 2018-05-13 MED FILL — SPIRONOLACTONE 50 MG TABLET: 50 | 30 days supply | Qty: 30 | Fill #1

## 2018-05-19 MED FILL — POTASSIUM CL ER 10 MEQ TAB: 10 | 30 days supply | Qty: 30 | Fill #2

## 2018-05-19 MED FILL — CETIRIZINE HCL 10 MG TABLET: 10 | 30 days supply | Qty: 30 | Fill #1

## 2018-05-22 ENCOUNTER — Encounter (HOSPITAL_BASED_OUTPATIENT_CLINIC_OR_DEPARTMENT_OTHER): Payer: Self-pay | Admitting: Cardiovascular Disease

## 2018-05-22 NOTE — Procedures (Signed)
Patient Name: Brenda Sims, Brenda Sims Date: 05/11/2018 Gender: Female D.O.B: 04-Dec-1955 Age (years): 62 Referring Provider: Kerin Ransom Height (inches): 64 Interpreting Physician: Shelva Majestic MD, [facility] Weight (lbs): 160 RPSGT: Jonna Coup BMI: 27 MRN: [id number] Neck Size: 15.50  CLINICAL INFORMATION The patient is referred for a split night study with BPAP.  MEDICATIONS     ACCU-CHEK SOFTCLIX LANCETS lancets             amLODipine (NORVASC) 10 MG tablet         aspirin EC 81 MG tablet         Blood Glucose Monitoring Suppl (ACCU-CHEK AVIVA) device         Blood Glucose Monitoring Suppl (TRUE METRIX METER) DEVI         busPIRone (BUSPAR) 5 MG tablet         carvedilol (COREG) 25 MG tablet         cetirizine (ZYRTEC) 10 MG tablet         furosemide (LASIX) 40 MG tablet         gabapentin (NEURONTIN) 300 MG capsule         glucose blood (ACCU-CHEK AVIVA) test strip         glucose blood (TRUE METRIX BLOOD GLUCOSE TEST) test strip         guaiFENesin-dextromethorphan (ROBITUSSIN DM) 100-10 MG/5ML syrup         hydrALAZINE (APRESOLINE) 100 MG tablet         Hyoscyamine Sulfate SL (LEVSIN/SL) 0.125 MG SUBL         insulin aspart (NOVOLOG) 100 UNIT/ML injection         Insulin Glargine (LANTUS SOLOSTAR) 100 UNIT/ML Solostar Pen         Insulin Pen Needle (TRUEPLUS PEN NEEDLES) 31G X 6 MM MISC         ipratropium-albuterol (DUONEB) 0.5-2.5 (3) MG/3ML SOLN         ketoconazole (NIZORAL) 2 % shampoo         montelukast (SINGULAIR) 10 MG tablet         potassium chloride SA (K-DUR,KLOR-CON) 10 MEQ tablet         rosuvastatin (CRESTOR) 40 MG tablet (Expired)         spironolactone (ALDACTONE) 50 MG tablet         TRUEPLUS LANCETS 28G MISC      Medications self-administered by patient taken the night of the study : N/A  SLEEP STUDY TECHNIQUE As per the AASM Manual for the Scoring of Sleep and Associated Events v2.3 (April 2016) with a hypopnea requiring 4%  desaturations.  The channels recorded and monitored were frontal, central and occipital EEG, electrooculogram (EOG), submentalis EMG (chin), nasal and oral airflow, thoracic and abdominal wall motion, anterior tibialis EMG, snore microphone, electrocardiogram, and pulse oximetry. Bi-level positive airway pressure (BiPAP) was initiated when the patient met split night criteria and was titrated according to treat sleep-disordered breathing.  RESPIRATORY PARAMETERS Diagnostic Total AHI (/hr): 27.9 RDI (/hr): 28.4 OA Index (/hr): 4.2 CA Index (/hr): 0.9 REM AHI (/hr): 17.1 NREM AHI (/hr): 29.2 Supine AHI (/hr): N/A Non-supine AHI (/hr): 28.40 Min O2 Sat (%): 82.0 Mean O2 (%): 86.9 Time below 88% (min): 103.2   Titration Optimal IPAP Pressure (cm): 20 Optimal EPAP Pressure (cm): 16 AHI at Optimal Pressure (/hr): 0.0 Min O2 at Optimal Pressure (%): 89.0 Sleep % at Optimal (%): 99 Supine % at Optimal (%): 0   SLEEP  ARCHITECTURE The study was initiated at 10:18:49 PM and terminated at 4:24:51 AM. The total recorded time was 366 minutes. EEG confirmed total sleep time was 336.9 minutes yielding a sleep efficiency of 92.0%%. Sleep onset after lights out was 0.0 minutes with a REM latency of 64.9 minutes. The patient spent 2.1%% of the night in stage N1 sleep, 79.5%% in stage N2 sleep, 0.0%% in stage N3 and 18.4% in REM. Wake after sleep onset (WASO) was 29.1 minutes. The Arousal Index was 26.4/hour.  LEG MOVEMENT DATA The total Periodic Limb Movements of Sleep (PLMS) were 0. The PLMS index was 0.0 .  CARDIAC DATA The 2 lead EKG demonstrated sinus rhythm. The mean heart rate was 100.0 beats per minute. Other EKG findings include: PVCs.  IMPRESSIONS - Moderate obstructive sleep apnea occurred during the diagnostic portion of the study (AHI 27.9 /h; RDI 28.4/h). CPAP was uinitiated and was titrated to 14 cm. BiPAP commenced at 15/11 and was titrated to optimal pressure at  20/16. - No significant central  sleep apnea occurred during the diagnostic portion of the study (CAI = 0.9/hour). - Moderate oxygen desaturation was noted during the diagnostic portion of the study (Min O2 = 82.0%). - The patient snored with moderate snoring volume during the diagnostic portion of the study. - EKG findings include PVCs. - Clinically significant periodic limb movements of sleep did not occur during the study.  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 A Small size Fisher[employer]Paykel Full Face Mask Simplus mask was used for BiPAP titration.  - Effort should be made to optimize nasal and oropharyngeal patency. - Avoid alcohol, sedatives and other CNS depressants that may worsen sleep apnea and disrupt normal sleep architecture. - Sleep hygiene should be reviewed to assess factors that may improve sleep quality. - Weight management and regular exercise should be initiated or continued. - Recommend a download be obtained in 30 days and sleep clinic evaluation after 4 weeks of therapy.  [Electronically signed] 05/22/2018 05:08 PM  Shelva Majestic MD, [facility], [place], American Board of Sleep Medicine   NPI: [id number] [place] PH: [phone]   FX: [phone] [place]

## 2018-05-23 ENCOUNTER — Telehealth: Payer: Self-pay | Admitting: *Deleted

## 2018-05-23 NOTE — Telephone Encounter (Signed)
Patient notified of sleep study results and recommendations. Order for BIPAP sent to [facility].

## 2018-05-23 NOTE — Telephone Encounter (Signed)
-----   Message from Troy Sine, MD sent at 05/22/2018  5:15 PM EDT ----- [name] please notify pt and set up with DME for BiPAP with f/u sleep clinic

## 2018-05-26 MED FILL — AMLODIPINE BESYLATE 10 MG T: 10 | 30 days supply | Qty: 30 | Fill #3

## 2018-05-27 ENCOUNTER — Telehealth: Payer: Self-pay | Admitting: Cardiology

## 2018-05-27 NOTE — Telephone Encounter (Signed)
No I will not refill gabapentin  Peter Martinique MD, [facility]

## 2018-05-27 NOTE — Telephone Encounter (Signed)
New Message:      *STAT* If patient is at the pharmacy, call can be transferred to refill team.    1. Which medications need to be refilled? (please list name of each medication and dose if known) gabapentin (NEURONTIN) 300 MG capsule  2. Which pharmacy/location (including street and city if local pharmacy) is medication to be sent to? [place], [place]  3. Do they need a 30 day or 90 day supply? 90 Days

## 2018-05-28 NOTE — Telephone Encounter (Signed)
Please refer PCP [facility]

## 2018-05-29 ENCOUNTER — Other Ambulatory Visit: Payer: Self-pay | Admitting: Family Medicine

## 2018-05-29 DIAGNOSIS — I10 Essential (primary) hypertension: Secondary | ICD-10-CM

## 2018-05-29 MED FILL — DOXYCYCLINE HYCLATE 100 MG: 100 | 14 days supply | Qty: 28 | Fill #0

## 2018-05-29 MED FILL — CARVEDILOL 25 MG TABLET: 25 | 30 days supply | Qty: 60 | Fill #0

## 2018-05-29 MED FILL — CLOTRIMAZOLE-BETAMETHASONE: 1-0.05 | 7 days supply | Qty: 45 | Fill #0

## 2018-05-29 NOTE — Telephone Encounter (Signed)
Returned call to patient left message on personal voice mail Dr.Croitoru advised refill gabapentin with PCP

## 2018-06-05 ENCOUNTER — Ambulatory Visit: Payer: Medicaid Other | Admitting: Cardiovascular Disease

## 2018-06-05 MED FILL — GABAPENTIN 300 MG CAPSULE: 300 | 30 days supply | Qty: 90 | Fill #1

## 2018-06-16 MED FILL — hydrALAZINE HCL 100 MG TABS: 100 | 30 days supply | Qty: 90 | Fill #1

## 2018-06-16 MED FILL — CETIRIZINE HCL 10 MG TABLET: 10 | 30 days supply | Qty: 30 | Fill #2

## 2018-06-16 MED FILL — SPIRONOLACTONE 50 MG TABLET: 50 | 30 days supply | Qty: 30 | Fill #2

## 2018-06-17 ENCOUNTER — Inpatient Hospital Stay: Payer: Medicaid Other | Admitting: Internal Medicine

## 2018-06-17 MED FILL — POTASSIUM CHLORIDE ER 10 ME: 10 | 30 days supply | Qty: 30 | Fill #0

## 2018-06-24 ENCOUNTER — Other Ambulatory Visit: Payer: Self-pay | Admitting: Family Medicine

## 2018-06-24 DIAGNOSIS — I10 Essential (primary) hypertension: Secondary | ICD-10-CM

## 2018-06-24 MED FILL — LANTUS SOLOSTAR 100 UNITS/M: 100 | 30 days supply | Qty: 15 | Fill #3

## 2018-06-27 ENCOUNTER — Ambulatory Visit: Payer: Medicaid Other | Admitting: Cardiovascular Disease

## 2018-06-27 ENCOUNTER — Other Ambulatory Visit: Payer: Self-pay

## 2018-06-27 ENCOUNTER — Emergency Department (HOSPITAL_COMMUNITY): Payer: Medicaid Other

## 2018-06-27 ENCOUNTER — Observation Stay (HOSPITAL_COMMUNITY)
Admission: EM | Admit: 2018-06-27 | Discharge: 2018-06-28 | Disposition: A | Discharge status: Home / Self Care | Payer: Medicaid Other | Attending: Internal Medicine | Admitting: Internal Medicine

## 2018-06-27 ENCOUNTER — Encounter (HOSPITAL_COMMUNITY): Payer: Self-pay

## 2018-06-27 DIAGNOSIS — E039 Hypothyroidism, unspecified: Secondary | ICD-10-CM | POA: Insufficient documentation

## 2018-06-27 DIAGNOSIS — I130 Hypertensive heart and chronic kidney disease with heart failure and stage 1 through stage 4 chronic kidney disease, or unspecified chronic kidney disease: Secondary | ICD-10-CM | POA: Diagnosis not present

## 2018-06-27 DIAGNOSIS — I5032 Chronic diastolic (congestive) heart failure: Secondary | ICD-10-CM | POA: Diagnosis present

## 2018-06-27 DIAGNOSIS — M25512 Pain in left shoulder: Secondary | ICD-10-CM | POA: Diagnosis present

## 2018-06-27 DIAGNOSIS — E1122 Type 2 diabetes mellitus with diabetic chronic kidney disease: Secondary | ICD-10-CM | POA: Insufficient documentation

## 2018-06-27 DIAGNOSIS — I10 Essential (primary) hypertension: Secondary | ICD-10-CM | POA: Diagnosis present

## 2018-06-27 DIAGNOSIS — Z7982 Long term (current) use of aspirin: Secondary | ICD-10-CM | POA: Insufficient documentation

## 2018-06-27 DIAGNOSIS — F418 Other specified anxiety disorders: Secondary | ICD-10-CM | POA: Insufficient documentation

## 2018-06-27 DIAGNOSIS — I5042 Chronic combined systolic (congestive) and diastolic (congestive) heart failure: Secondary | ICD-10-CM | POA: Diagnosis not present

## 2018-06-27 DIAGNOSIS — I25119 Atherosclerotic heart disease of native coronary artery with unspecified angina pectoris: Secondary | ICD-10-CM | POA: Diagnosis present

## 2018-06-27 DIAGNOSIS — F32A Depression, unspecified: Secondary | ICD-10-CM | POA: Diagnosis present

## 2018-06-27 DIAGNOSIS — E1129 Type 2 diabetes mellitus with other diabetic kidney complication: Secondary | ICD-10-CM

## 2018-06-27 DIAGNOSIS — N184 Chronic kidney disease, stage 4 (severe): Secondary | ICD-10-CM | POA: Diagnosis present

## 2018-06-27 DIAGNOSIS — N183 Chronic kidney disease, stage 3 (moderate): Secondary | ICD-10-CM | POA: Diagnosis not present

## 2018-06-27 DIAGNOSIS — Z794 Long term (current) use of insulin: Secondary | ICD-10-CM | POA: Insufficient documentation

## 2018-06-27 DIAGNOSIS — IMO0001 Reserved for inherently not codable concepts without codable children: Secondary | ICD-10-CM | POA: Diagnosis present

## 2018-06-27 DIAGNOSIS — F419 Anxiety disorder, unspecified: Secondary | ICD-10-CM | POA: Diagnosis not present

## 2018-06-27 DIAGNOSIS — J449 Chronic obstructive pulmonary disease, unspecified: Secondary | ICD-10-CM | POA: Insufficient documentation

## 2018-06-27 DIAGNOSIS — Z79899 Other long term (current) drug therapy: Secondary | ICD-10-CM | POA: Insufficient documentation

## 2018-06-27 DIAGNOSIS — E785 Hyperlipidemia, unspecified: Secondary | ICD-10-CM | POA: Diagnosis not present

## 2018-06-27 DIAGNOSIS — F329 Major depressive disorder, single episode, unspecified: Secondary | ICD-10-CM | POA: Diagnosis present

## 2018-06-27 DIAGNOSIS — R079 Chest pain, unspecified: Secondary | ICD-10-CM | POA: Diagnosis not present

## 2018-06-27 DIAGNOSIS — I509 Heart failure, unspecified: Secondary | ICD-10-CM

## 2018-06-27 DIAGNOSIS — Z8674 Personal history of sudden cardiac arrest: Secondary | ICD-10-CM

## 2018-06-27 LAB — CBC WITH DIFFERENTIAL/PLATELET
ABS IMMATURE GRANULOCYTES: 0.05 10*3/uL (ref 0.00–0.07)
BASOS ABS: 0.1 10*3/uL (ref 0.0–0.1)
BASOS PCT: 1 %
EOS ABS: 0.2 10*3/uL (ref 0.0–0.5)
Eosinophils Relative: 2 %
HCT: 32.4 % — ABNORMAL LOW (ref 36.0–46.0)
Hemoglobin: 10.2 g/dL — ABNORMAL LOW (ref 12.0–15.0)
IMMATURE GRANULOCYTES: 1 %
Lymphocytes Relative: 21 %
Lymphs Abs: 2 10*3/uL (ref 0.7–4.0)
MCH: 24.6 pg — ABNORMAL LOW (ref 26.0–34.0)
MCHC: 31.5 g/dL (ref 30.0–36.0)
MCV: 78.3 fL — AB (ref 80.0–100.0)
MONOS PCT: 10 %
Monocytes Absolute: 1 10*3/uL (ref 0.1–1.0)
NEUTROS PCT: 65 %
NRBC: 0 % (ref 0.0–0.2)
Neutro Abs: 6.3 10*3/uL (ref 1.7–7.7)
PLATELETS: 233 10*3/uL (ref 150–400)
RBC: 4.14 MIL/uL (ref 3.87–5.11)
RDW: 18.4 % — AB (ref 11.5–15.5)
WBC: 9.6 10*3/uL (ref 4.0–10.5)

## 2018-06-27 LAB — BRAIN NATRIURETIC PEPTIDE: B Natriuretic Peptide: 39 pg/mL (ref 0.0–100.0)

## 2018-06-27 LAB — COMPREHENSIVE METABOLIC PANEL
ALBUMIN: 3.6 g/dL (ref 3.5–5.0)
ALT: 14 U/L (ref 0–44)
AST: 21 U/L (ref 15–41)
Alkaline Phosphatase: 54 U/L (ref 38–126)
Anion gap: 12 (ref 5–15)
BILIRUBIN TOTAL: 0.4 mg/dL (ref 0.3–1.2)
BUN: 39 mg/dL — ABNORMAL HIGH (ref 8–23)
CO2: 23 mmol/L (ref 22–32)
Calcium: 9.4 mg/dL (ref 8.9–10.3)
Chloride: 104 mmol/L (ref 98–111)
Creatinine, Ser: 2.56 mg/dL — ABNORMAL HIGH (ref 0.44–1.00)
GFR calc Af Amer: 22 mL/min — ABNORMAL LOW (ref >60)
GFR calc non Af Amer: 19 mL/min — ABNORMAL LOW (ref >60)
Glucose, Bld: 112 mg/dL — ABNORMAL HIGH (ref 70–99)
Potassium: 4 mmol/L (ref 3.5–5.1)
SODIUM: 139 mmol/L (ref 135–145)
TOTAL PROTEIN: 7.5 g/dL (ref 6.5–8.1)

## 2018-06-27 LAB — TROPONIN I: Troponin I: <0.03 ng/mL (ref <0.03)

## 2018-06-27 LAB — CBC
HCT: 33.6 % — ABNORMAL LOW (ref 36.0–46.0)
Hemoglobin: 10.6 g/dL — ABNORMAL LOW (ref 12.0–15.0)
MCH: 24.5 pg — AB (ref 26.0–34.0)
MCHC: 31.5 g/dL (ref 30.0–36.0)
MCV: 77.8 fL — AB (ref 80.0–100.0)
Platelets: 245 10*3/uL (ref 150–400)
RBC: 4.32 MIL/uL (ref 3.87–5.11)
RDW: 18.3 % — AB (ref 11.5–15.5)
WBC: 10 10*3/uL (ref 4.0–10.5)
nRBC: 0 % (ref 0.0–0.2)

## 2018-06-27 LAB — CREATININE, SERUM
Creatinine, Ser: 2.72 mg/dL — ABNORMAL HIGH (ref 0.44–1.00)
GFR calc Af Amer: 20 mL/min — ABNORMAL LOW (ref >60)
GFR calc non Af Amer: 18 mL/min — ABNORMAL LOW (ref >60)

## 2018-06-27 LAB — GLUCOSE, CAPILLARY: Glucose-Capillary: 187 mg/dL — ABNORMAL HIGH (ref 70–99)

## 2018-06-27 MED ORDER — CARVEDILOL 12.5 MG PO TABS
25.0000 mg | ORAL_TABLET | Freq: Two times a day (BID) | ORAL | Status: DC
Start: 2018-06-27 — End: 2018-06-27

## 2018-06-27 MED ORDER — INSULIN ASPART 100 UNIT/ML ~~LOC~~ SOLN
0.0000 [IU] | Freq: Three times a day (TID) | SUBCUTANEOUS | Status: DC
  Administered 2018-06-28: 2 [IU] via SUBCUTANEOUS

## 2018-06-27 MED ORDER — ACETAMINOPHEN 650 MG RE SUPP: 650.0000 mg | Freq: Four times a day (QID) | RECTAL | Status: DC | PRN

## 2018-06-27 MED ORDER — INSULIN ASPART 100 UNIT/ML ~~LOC~~ SOLN: 0.0000 [IU] | Freq: Every day | SUBCUTANEOUS | Status: DC

## 2018-06-27 MED ORDER — ROSUVASTATIN CALCIUM 40 MG PO TABS
40.0000 mg | ORAL_TABLET | Freq: Every day | ORAL | Status: DC
  Administered 2018-06-28: 40 mg via ORAL
  Filled 2018-06-27: qty 1
  Filled 2018-06-27: qty 2

## 2018-06-27 MED ORDER — BUSPIRONE HCL 10 MG PO TABS: 5.0000 mg | ORAL_TABLET | Freq: Three times a day (TID) | ORAL | Status: DC

## 2018-06-27 MED ORDER — INSULIN GLARGINE 100 UNIT/ML SOLOSTAR PEN: 20.0000 [IU] | PEN_INJECTOR | Freq: Two times a day (BID) | SUBCUTANEOUS | Status: DC

## 2018-06-27 MED ORDER — ACETAMINOPHEN 325 MG PO TABS
650.0000 mg | ORAL_TABLET | Freq: Four times a day (QID) | ORAL | Status: DC | PRN
  Administered 2018-06-27: 650 mg via ORAL
  Filled 2018-06-27: qty 2

## 2018-06-27 MED ORDER — FUROSEMIDE 10 MG/ML IJ SOLN: 40.0000 mg | Freq: Two times a day (BID) | INTRAMUSCULAR | Status: DC

## 2018-06-27 MED ORDER — MONTELUKAST SODIUM 10 MG PO TABS
10.0000 mg | ORAL_TABLET | Freq: Every day | ORAL | Status: DC
  Administered 2018-06-27: 10 mg via ORAL
  Filled 2018-06-27: qty 1

## 2018-06-27 MED ORDER — AMLODIPINE BESYLATE 5 MG PO TABS: 10.0000 mg | ORAL_TABLET | Freq: Every day | ORAL | Status: DC

## 2018-06-27 MED ORDER — ASPIRIN EC 81 MG PO TBEC
81.0000 mg | DELAYED_RELEASE_TABLET | Freq: Every day | ORAL | Status: DC
  Administered 2018-06-28: 81 mg via ORAL
  Filled 2018-06-27: qty 1

## 2018-06-27 MED ORDER — ENOXAPARIN SODIUM 40 MG/0.4ML ~~LOC~~ SOLN
40.0000 mg | SUBCUTANEOUS | Status: DC
  Administered 2018-06-27: 40 mg via SUBCUTANEOUS
  Filled 2018-06-27: qty 0.4

## 2018-06-27 MED ORDER — FLUOXETINE HCL 20 MG PO CAPS
20.0000 mg | ORAL_CAPSULE | Freq: Every day | ORAL | Status: DC
  Filled 2018-06-27: qty 1

## 2018-06-27 MED ORDER — GABAPENTIN 300 MG PO CAPS
300.0000 mg | ORAL_CAPSULE | Freq: Three times a day (TID) | ORAL | Status: DC
  Administered 2018-06-27 – 2018-06-28 (×2): 300 mg via ORAL
  Filled 2018-06-27 (×2): qty 1

## 2018-06-27 MED ORDER — POTASSIUM CHLORIDE CRYS ER 10 MEQ PO TBCR
10.0000 meq | EXTENDED_RELEASE_TABLET | Freq: Two times a day (BID) | ORAL | Status: DC
  Administered 2018-06-27 – 2018-06-28 (×2): 10 meq via ORAL
  Filled 2018-06-27 (×3): qty 1

## 2018-06-27 MED ORDER — HYDROCODONE-ACETAMINOPHEN 5-325 MG PO TABS
1.0000 | ORAL_TABLET | ORAL | Status: DC | PRN
  Administered 2018-06-27 – 2018-06-28 (×2): 2 via ORAL
  Filled 2018-06-27 (×2): qty 2

## 2018-06-27 MED ORDER — FUROSEMIDE 10 MG/ML IJ SOLN
80.0000 mg | Freq: Once | INTRAMUSCULAR | Status: AC
  Administered 2018-06-27: 80 mg via INTRAVENOUS
  Filled 2018-06-27: qty 8

## 2018-06-27 MED ORDER — IPRATROPIUM-ALBUTEROL 0.5-2.5 (3) MG/3ML IN SOLN: 3.0000 mL | Freq: Four times a day (QID) | RESPIRATORY_TRACT | Status: DC | PRN

## 2018-06-27 MED ORDER — INSULIN GLARGINE 100 UNIT/ML ~~LOC~~ SOLN
20.0000 [IU] | Freq: Two times a day (BID) | SUBCUTANEOUS | Status: DC
  Administered 2018-06-27 – 2018-06-28 (×2): 20 [IU] via SUBCUTANEOUS
  Filled 2018-06-27 (×3): qty 0.2

## 2018-06-27 MED ORDER — SPIRONOLACTONE 50 MG PO TABS
50.0000 mg | ORAL_TABLET | Freq: Every day | ORAL | Status: DC
  Administered 2018-06-27 – 2018-06-28 (×2): 50 mg via ORAL
  Filled 2018-06-27: qty 2
  Filled 2018-06-27 (×2): qty 1

## 2018-06-27 MED ORDER — FENTANYL CITRATE (PF) 100 MCG/2ML IJ SOLN
50.0000 ug | Freq: Once | INTRAMUSCULAR | Status: AC
  Administered 2018-06-27: 50 ug via INTRAVENOUS
  Filled 2018-06-27: qty 2

## 2018-06-27 MED ORDER — LORATADINE 10 MG PO TABS
10.0000 mg | ORAL_TABLET | Freq: Every day | ORAL | Status: DC
  Administered 2018-06-28: 10 mg via ORAL
  Filled 2018-06-27: qty 1

## 2018-06-27 MED ORDER — METHOCARBAMOL 500 MG PO TABS: 500.0000 mg | ORAL_TABLET | Freq: Four times a day (QID) | ORAL | Status: DC | PRN

## 2018-06-27 MED ORDER — CARVEDILOL 12.5 MG PO TABS
12.5000 mg | ORAL_TABLET | Freq: Two times a day (BID) | ORAL | Status: DC
  Administered 2018-06-28: 12.5 mg via ORAL
  Filled 2018-06-27: qty 1

## 2018-06-27 MED ORDER — INSULIN ASPART 100 UNIT/ML ~~LOC~~ SOLN
4.0000 [IU] | Freq: Three times a day (TID) | SUBCUTANEOUS | Status: DC
  Administered 2018-06-28: 4 [IU] via SUBCUTANEOUS

## 2018-06-27 MED ORDER — FUROSEMIDE 10 MG/ML IJ SOLN
40.0000 mg | Freq: Every day | INTRAMUSCULAR | Status: DC
  Administered 2018-06-28: 40 mg via INTRAVENOUS
  Filled 2018-06-27: qty 4

## 2018-06-27 NOTE — H&P (Signed)
Brenda Sims EHU:[id number] DOB: 11-May-1956 DOA: 06/27/2018     PCP: Charlott Rakes, MD   Outpatient Specialists:  CARDS: Dr. Kathlene Cote    Patient arrived to ER on 06/27/18 at 1228  Patient coming from: home Lives   With friends    Chief Complaint:  Chief Complaint  Patient presents with  . Chest Pain    HPI: Brenda Sims is a 62 y.o. female with medical history significant of COPD on 2 L at night, DM2, HTN, cardiac arrest, CKD, apnea not on CPAP hypothyroidism depression    Presented with 1 wk hx of left shoulder pain radiating up to the neck and across in to the chest has been going on for over a week now and continues to bother her significantly she is unable to move her left arm it hurts her to put arm above her head she denies any injury she denies lifting anything heavy today she did feel somewhat short of breath and presented to emergency department She Was given 324 of aspirin.  Regarding pertinent Chronic problems: Patient is on Lasix at home 80 mg in the morning and 40 at night she has been taking her medications as prescribed she has had some decreased p.o. intake lately but usually tries to avoid salty foods History of diabetes on Lantus While in ER: X-ray showed slight congestion but BMP was within normal limits patient was given 80 of Lasix IV Blood pressures been soft during her ER stay  Cr 2.56 Hg 10.2 The following Work up has been ordered so far:  Orders Placed This Encounter  Procedures  . DG Chest 2 View  . Comprehensive metabolic panel  . CBC with Differential  . Troponin I - Once  . Brain natriuretic peptide  . CBC  . Creatinine, serum  . Creatinine, serum  . Basic metabolic panel  . Troponin I - Now Then Q6H  . Diet heart healthy/carb modified Room service appropriate? Yes; Fluid consistency: Thin  . Vital signs  . Notify physician  . Initiate Oral Care Protocol  . Initiate Carrier Fluid Protocol  . RN may order General Admission PRN  Orders utilizing "General Admission PRN medications" (through manage orders) for the following patient needs: allergy symptoms (Claritin), cold sores (Carmex), cough (Robitussin DM), eye irritation (Liquifilm Tears), hemorrhoids (Tucks), indigestion (Maalox), minor skin irritation (Hydrocortisone Cream), muscle pain Suezanne Jacquet Gay), nose irritation (saline nasal spray) and sore throat (Chloraseptic spray).  . Cardiac Monitoring Continuous x 24 hours Indications for use: Sub-acute heart failure  . Full code  . Inpatient consult to Cardiology  . Consult for [facility] Admission  . EKG 12-Lead  . Saline lock IV  . Place in observation (patient's expected length of stay will be less than 2 midnights)     Following Medications were ordered in ER: Medications  aspirin EC tablet 81 mg (has no administration in time range)  rosuvastatin (CRESTOR) tablet 40 mg (has no administration in time range)  spironolactone (ALDACTONE) tablet 50 mg (has no administration in time range)  FLUoxetine (PROZAC) capsule 20 mg (has no administration in time range)  insulin aspart (novoLOG) injection 4 Units (has no administration in time range)  Insulin Glargine (LANTUS) Solostar Pen 20 Units (has no administration in time range)  gabapentin (NEURONTIN) capsule 300 mg (has no administration in time range)  potassium chloride (K-DUR) CR tablet 10 mEq (has no administration in time range)  loratadine (CLARITIN) tablet 10 mg (has no administration in time range)  ipratropium-albuterol (DUONEB) 0.5-2.5 (3) MG/3ML nebulizer solution 3 mL (has no administration in time range)  montelukast (SINGULAIR) tablet 10 mg (has no administration in time range)  enoxaparin (LOVENOX) injection 40 mg (has no administration in time range)  HYDROcodone-acetaminophen (NORCO/VICODIN) 5-325 MG per tablet 1-2 tablet (has no administration in time range)  acetaminophen (TYLENOL) tablet 650 mg (has no administration in time range)    Or    acetaminophen (TYLENOL) suppository 650 mg (has no administration in time range)  furosemide (LASIX) injection 40 mg (has no administration in time range)  carvedilol (COREG) tablet 12.5 mg (has no administration in time range)  fentaNYL (SUBLIMAZE) injection 50 mcg (50 mcg Intravenous Given 06/27/18 1420)  furosemide (LASIX) injection 80 mg (80 mg Intravenous Given 06/27/18 1514)    Significant initial  Findings: Abnormal Labs Reviewed  COMPREHENSIVE METABOLIC PANEL - Abnormal; Notable for the following components:      Result Value   Glucose, Bld 112 (*)    BUN 39 (*)    Creatinine, Ser 2.56 (*)    GFR calc non Af Amer 19 (*)    GFR calc Af Amer 22 (*)    All other components within normal limits  CBC WITH DIFFERENTIAL/PLATELET - Abnormal; Notable for the following components:   Hemoglobin 10.2 (*)    HCT 32.4 (*)    MCV 78.3 (*)    MCH 24.6 (*)    RDW 18.4 (*)    All other components within normal limits     Na 139 K 4.0  Cr   stable,    Lab Results  Component Value Date   CREATININE 2.56 (H) 06/27/2018   CREATININE 2.32 (H) 05/06/2018   CREATININE 2.15 (H) 04/18/2018      WBC  10  HG/HCT  stable,      Component Value Date/Time   HGB 10.2 (L) 06/27/2018 1255   HGB 10.4 (L) 04/18/2018 1517   HCT 32.4 (L) 06/27/2018 1255   HCT 32.6 (L) 04/18/2018 1517       Troponin (Point of Care Test) No results for input(s): TROPIPOC in the last 72 hours.    BNP (last 3 results) Recent Labs    10/19/17 2016 03/05/18 2149 06/27/18 1255  BNP 1,539.6* 32.4 39.0    ProBNP (last 3 results) No results for input(s): PROBNP in the last 8760 hours.  Lactic Acid, Venous    Component Value Date/Time   LATICACIDVEN 1.15 10/07/2017 1834      UA not ordered      CXR - Mild cardiac enlargement with diffuse interstitial edema pattern suspicious for early developing CHF   LEft shoulder film  -  Ordered but no acute abnormality noted on chest x-ray  ECG:   Personally reviewed by me showing: HR : 63 Rhythm:   Sinus tachycardia  nonspecific changes abnormal R wave progression  QTC 428    ED Triage Vitals  Enc Vitals Group     BP 06/27/18 1245 (!) 99/46     Pulse Rate 06/27/18 1245 69     Resp 06/27/18 1245 17     Temp --      Temp src --      SpO2 06/27/18 1245 98 %     Weight --      Height --      Head Circumference --      Peak Flow --      Pain Score 06/27/18 1235 9     Pain Loc --  Pain Edu? --      Excl. in [place]? --   TMAX(24)@       Latest  Blood pressure 104/82, pulse 80, resp. rate 19, SpO2 96 %.     Hospitalist was called for admission for observation for typical pain and possible fluid overload   Review of Systems:    Pertinent positives include: chest pain, left shoulder pain   Constitutional:  No weight loss, night sweats, Fevers, chills, fatigue, weight loss  HEENT:  No headaches, Difficulty swallowing,Tooth/dental problems,Sore throat,  No sneezing, itching, ear ache, nasal congestion, post nasal drip,  Cardio-vascular:  No Orthopnea, PND, anasarca, dizziness, palpitations.no Bilateral lower extremity swelling  GI:  No heartburn, indigestion, abdominal pain, nausea, vomiting, diarrhea, change in bowel habits, loss of appetite, melena, blood in stool, hematemesis Resp:  no shortness of breath at rest. No dyspnea on exertion, No excess mucus, no productive cough, No non-productive cough, No coughing up of blood.No change in color of mucus.No wheezing. Skin:  no rash or lesions. No jaundice GU:  no dysuria, change in color of urine, no urgency or frequency. No straining to urinate.  No flank pain.  Musculoskeletal:  No joint pain or no joint swelling. No decreased range of motion. No back pain.  Psych:  No change in mood or affect. No depression or anxiety. No memory loss.  Neuro: no localizing neurological complaints, no tingling, no weakness, no double vision, no gait abnormality, no slurred  speech, no confusion  All systems reviewed and apart from Van all are negative  Past Medical History:   Past Medical History:  Diagnosis Date  . Anemia   . Anxiety   . CAD (coronary artery disease)   . CHF (congestive heart failure) ([place])   . CKD (chronic kidney disease)   . COPD (chronic obstructive pulmonary disease) ([place])   . Depression   . Diabetes mellitus without complication ([place])   . Hypertension   . Hypothyroid   . MI (myocardial infarction) ([place])   . Sleep apnea    no cpap , "never gave me one"      Past Surgical History:  Procedure Laterality Date  . APPENDECTOMY    . BACK SURGERY    . LEFT HEART CATH AND CORONARY ANGIOGRAPHY N/A 09/11/2017   Procedure: LEFT HEART CATH AND CORONARY ANGIOGRAPHY;  Surgeon: Martinique, Peter M, MD;  Location: [facility];  Service: Cardiovascular;  Laterality: N/A;  . TOTAL KNEE ARTHROPLASTY Right 2007    Social History:  Ambulatory  Independently      reports that she has never smoked. She has never used smokeless tobacco. She reports that she does not drink alcohol or use drugs.     Family History:   Family History  Problem Relation Age of Onset  . Hypertension Mother   . Colon cancer Neg Hx   . Esophageal cancer Neg Hx     Allergies: Allergies  Allergen Reactions  . Eggs Or Egg-Derived Products Shortness Of Breath and Rash  . Azithromycin Swelling    Facial/throat swelling  . Cherry Swelling    Facial swelling  . Latex Rash     Prior to Admission medications   Medication Sig Start Date End Date Taking? Authorizing Provider  amLODipine (NORVASC) 10 MG tablet Take 1 tablet (10 mg total) by mouth daily. MUST MAKE APPT FOR FURTHER REFILLS Patient taking differently: Take 10 mg by mouth daily.  06/24/18  Yes Charlott Rakes, MD  aspirin EC 81 MG tablet  Take 1 tablet (81 mg total) by mouth daily. Patient taking differently: Take 81 mg by mouth 2 (two) times daily.  10/16/17  Yes Noel, Tiffany S, PA-C    carvedilol (COREG) 25 MG tablet TAKE 1 TABLET BY MOUTH 2 (TWO) TIMES DAILY WITH A MEAL. Patient taking differently: Take 25 mg by mouth 2 (two) times daily with a meal.  05/29/18  Yes Newlin, Enobong, MD  cetirizine (ZYRTEC) 10 MG tablet TAKE 1 TABLET (10 MG TOTAL) BY MOUTH DAILY. 03/11/18  Yes Charlott Rakes, MD  clotrimazole-betamethasone (LOTRISONE) cream Apply 1 application topically See admin instructions. Apply to affected areas of scalp daily as directed 05/29/18  Yes [provider]  Hyoscyamine Sulfate SL (LEVSIN/SL) 0.125 MG SUBL Place 1 tablet under the tongue every 4 (four) hours as needed. Patient taking differently: Place 1 tablet under the tongue every 4 (four) hours as needed (for abdominal cramping).  05/07/18  Yes Upstill, Shari, PA-C  insulin aspart (NOVOLOG) 100 UNIT/ML injection Inject 4 Units into the skin 3 (three) times daily with meals. Patient taking differently: Inject 4 Units into the skin 3 (three) times daily after meals.  01/21/18  Yes Charlott Rakes, MD  Insulin Glargine (LANTUS SOLOSTAR) 100 UNIT/ML Solostar Pen Inject 20 Units into the skin 2 (two) times daily. Patient taking differently: Inject 20 Units into the skin 3 (three) times daily.  01/21/18  Yes Charlott Rakes, MD  ACCU-CHEK SOFTCLIX LANCETS lancets Use as instructed 10/16/17   Brayton Caves, PA-C  Blood Glucose Monitoring Suppl (ACCU-CHEK AVIVA) device Use as instructed 10/16/17 10/16/18  Brayton Caves, PA-C  Blood Glucose Monitoring Suppl (TRUE METRIX METER) DEVI 1 kit by Does not apply route 4 (four) times daily. 10/16/17   Brayton Caves, PA-C  busPIRone (BUSPAR) 5 MG tablet Take 1 tablet (5 mg total) by mouth 3 (three) times daily. Patient not taking: Reported on 06/27/2018 01/21/18   Charlott Rakes, MD  FLUoxetine (PROZAC) 20 MG capsule Take 20 mg by mouth daily. 05/13/18   [provider]  furosemide (LASIX) 40 MG tablet Take 2 tablets (80 mg total) by mouth every morning AND 1 tablet  (40 mg total) every evening. 02/24/18 02/19/19  Croitoru, Mihai, MD  gabapentin (NEURONTIN) 300 MG capsule Take 1 capsule (300 mg total) by mouth 3 (three) times daily. 01/21/18   Charlott Rakes, MD  glucose blood (ACCU-CHEK AVIVA) test strip Use as instructed 10/16/17   Brayton Caves, PA-C  glucose blood (TRUE METRIX BLOOD GLUCOSE TEST) test strip Use as instructed 10/16/17   Brayton Caves, PA-C  guaiFENesin-dextromethorphan (ROBITUSSIN DM) 100-10 MG/5ML syrup Take 5 mLs by mouth every 4 (four) hours as needed for cough. 10/08/17   Mikhail, Velta Addison, DO  hydrALAZINE (APRESOLINE) 100 MG tablet Take 1 tablet (100 mg total) by mouth 3 (three) times daily. 01/21/18   Charlott Rakes, MD  Insulin Pen Needle (TRUEPLUS PEN NEEDLES) 31G X 6 MM MISC Use as directed 11/08/17   Charlott Rakes, MD  ipratropium-albuterol (DUONEB) 0.5-2.5 (3) MG/3ML SOLN Take 3 mLs by nebulization every 6 (six) hours as needed. 03/07/18   Kayleen Memos, DO  ketoconazole (NIZORAL) 2 % shampoo Daily for the next week then twice a week until resolution 02/17/18   Augusto Gamble B, NP  losartan (COZAAR) 100 MG tablet Take 100 mg by mouth daily. 05/13/18   [provider]  montelukast (SINGULAIR) 10 MG tablet Take 1 tablet (10 mg total) by mouth at bedtime. 01/21/18  Charlott Rakes, MD  potassium chloride (K-DUR) 10 MEQ tablet Take 10 mEq by mouth daily. 06/17/18   [provider]  potassium chloride SA (K-DUR,KLOR-CON) 10 MEQ tablet Take 1 tablet (10 mEq total) by mouth daily. 01/21/18   Charlott Rakes, MD  rosuvastatin (CRESTOR) 40 MG tablet Take 1 tablet (40 mg total) by mouth daily. 01/21/18 06/27/18  Charlott Rakes, MD  spironolactone (ALDACTONE) 50 MG tablet Take 1 tablet (50 mg total) by mouth daily. 01/21/18   Charlott Rakes, MD  TRUEPLUS LANCETS 28G MISC 28 g by Does not apply route 4 (four) times daily. 10/16/17   Brayton Caves, PA-C   Physical Exam: Blood pressure 104/82, pulse 80, resp. rate 19, SpO2 96 %. 1.  General:  in No Acute distress   Chronically ill  -appearing 2. Psychological: Alert and   Oriented 3. Head/ENT:   Dry Mucous Membranes                          Head Non traumatic, neck supple                         Poor Dentition 4. SKIN: decreased Skin turgor,  Skin clean Dry and intact no rash 5. Heart: Regular rate and rhythm no Murmur, no Rub or gallop 6. Lungs:  no wheezes or crackles   7. Abdomen: Soft, non-tender, Non distended   obese   8. Lower extremities: no clubbing, cyanosis, or  edema 9. Neurologically Grossly intact, moving all 4 extremities equally  10. MSK: Normal range of motion   LABS:     Recent Labs  Lab 06/27/18 1255  WBC 9.6  NEUTROABS 6.3  HGB 10.2*  HCT 32.4*  MCV 78.3*  PLT 093   Basic Metabolic Panel: Recent Labs  Lab 06/27/18 1255  NA 139  K 4.0  CL 104  CO2 23  GLUCOSE 112*  BUN 39*  CREATININE 2.56*  CALCIUM 9.4      Recent Labs  Lab 06/27/18 1255  AST 21  ALT 14  ALKPHOS 54  BILITOT 0.4  PROT 7.5  ALBUMIN 3.6   No results for input(s): LIPASE, AMYLASE in the last 168 hours. No results for input(s): AMMONIA in the last 168 hours.    HbA1C: No results for input(s): HGBA1C in the last 72 hours. CBG: No results for input(s): GLUCAP in the last 168 hours.    Urine analysis:    Component Value Date/Time   COLORURINE YELLOW 05/06/2018 2259   APPEARANCEUR HAZY (A) 05/06/2018 2259   LABSPEC 1.010 05/06/2018 2259   PHURINE 5.0 05/06/2018 2259   GLUCOSEU NEGATIVE 05/06/2018 2259   HGBUR NEGATIVE 05/06/2018 2259   BILIRUBINUR NEGATIVE 05/06/2018 2259   KETONESUR NEGATIVE 05/06/2018 2259   PROTEINUR NEGATIVE 05/06/2018 2259   NITRITE NEGATIVE 05/06/2018 2259   LEUKOCYTESUR NEGATIVE 05/06/2018 2259       Cultures:    Component Value Date/Time   SDES BLOOD RIGHT HAND 03/06/2018 0048   SPECREQUEST  03/06/2018 0048    BOTTLES DRAWN AEROBIC ONLY Blood Culture results may not be optimal due to an excessive volume of  blood received in culture bottles   CULT  03/06/2018 0048    NO GROWTH 5 DAYS Performed at [facility], [place] [address], [place], [place] [address]    REPTSTATUS 03/11/2018 FINAL 03/06/2018 0048     Radiological Exams on Admission: Dg Chest 2 View  Result Date: 06/27/2018 CLINICAL DATA:  Chest pain, hypertension, diabetes, history of CHF EXAM: CHEST - 2 VIEW COMPARISON:  05/07/2018 FINDINGS: Mild cardiac enlargement with vascular and interstitial prominence diffusely suggesting early interstitial edema. No large effusion or pneumothorax. No significant collapse or consolidation. Trachea is midline. Aorta atherosclerotic. Degenerative changes of the spine. Monitor leads overlie the chest. IMPRESSION: Mild cardiac enlargement with diffuse interstitial edema pattern suspicious for early developing CHF. Electronically Signed   By: Jerilynn Mages.  Shick M.D.   On: 06/27/2018 14:06    Chart has been reviewed    Assessment/Plan   62 y.o. female with medical history significant of COPD on 2 L at night, DM2, HTN, cardiac arrest, CKD, apnea not on CPAP hypothyroidism depression    Admitted for a typical left shoulder/chest pain and slight fluid overload suggestive of early CHF although clinically appears to be less likely  Present on Admission: . Chest pain -likely musculoskeletal in origin but given risk factors and prior history of cardiac arrest will observe overnight and cycle cardiac enzymes obtain serial EKG and monitor . Left shoulder pain suspect musculoskeletal will try to use Robaxin for pain management, plain imaging ordered patient probably need to follow-up with orthopedics to prevent frozen shoulder syndrome and maintain mobility.  Will need physical therapy evaluation . Essential hypertension stable continue home medications . CKD (chronic kidney disease) stable avoid nephrotoxic medications . Coronary artery disease involving native coronary artery of native heart with angina pectoris  ([place]) -stable continue aspirin and statins beta-blocker with holding parameters . Type II diabetes mellitus with renal manifestations (HCC) order sliding scale . Chronic combined systolic and diastolic CHF (congestive heart failure) ([place]) 6 in ER if blood pressure allows could tolerate another dose in a.m. does not appear to be in severe fluid overload will need to carefully monitor to avoid overdiuresis . HLD (hyperlipidemia) stable continue home medications . Anxiety and depression stable continue home medications    Other plan as per orders.  DVT prophylaxis:   Lovenox     Code Status:  FULL CODE   as per patient  I had personally discussed CODE STATUS with patient   Family Communication:   Family not at  Bedside    Disposition Plan:     To home once workup is complete and patient is stable                   Would benefit from PT  eval prior to DC  Ordered                    Consults called: none    Admission status:   Obs    Level of care     tele  For 12H       Chidera Dearcos 06/27/2018, 9:42 PM    [facility]  Pager [phone]   after 2 AM please page floor coverage PA If 7AM-7PM, please contact the day team taking care of the patient  [email or web address]  Password [email or web address]

## 2018-06-27 NOTE — ED Notes (Signed)
Xray for pt, she told the transport that she has already gone and she has not seen a provider.  RN stepped in to explain she had met w/ the Provider (wearing the white coat).  RN explained that she had told her she was having shoulder pain so the provider wanted to have it checked.  She stated "I'm just not up for that right now.... I should be upstairs."

## 2018-06-27 NOTE — ED Notes (Signed)
Report given to rn on 6e 

## 2018-06-27 NOTE — ED Triage Notes (Addendum)
[facility]- pt coming from PCP office with c/o chest pain X1 week. She also has some pain in left shoulder radiating down to her left shoulder, worse with movement. Hx of MI and cardiac arrest. Pt had total of 324mg  of aspirin PTA.

## 2018-06-27 NOTE — ED Provider Notes (Signed)
[facility] EMERGENCY DEPARTMENT Provider Note   CSN: [id number] Arrival date & time: 06/27/18  1228     History   Chief Complaint Chief Complaint  Patient presents with  . Chest Pain    HPI Brenda Sims is a 62 y.o. female.  The history is provided by the patient. No language interpreter was used.  Chest Pain     Brenda Sims is a 62 y.o. female who presents to the Emergency Department complaining of chest pain. Resents to the emergency department from her PCPs office for evaluation of chest and shoulder pain. She has had pain in her left neck and shoulder down her left arm for one week. For the last few days she has had associated pain in her chest as well in the left upper chest. Pain is worse with movement. She also has associated shortness of breath and difficulty laying down flat. Shortness of breath is been worsening over the last few days. She denies any leg swelling. No recent injuries. She received 324 of aspirin prior to arrival. Past Medical History:  Diagnosis Date  . Anemia   . Anxiety   . CAD (coronary artery disease)   . CHF (congestive heart failure) ([place])   . CKD (chronic kidney disease)   . COPD (chronic obstructive pulmonary disease) ([place])   . Depression   . Diabetes mellitus without complication ([place])   . Hypertension   . Hypothyroid   . MI (myocardial infarction) ([place])   . Sleep apnea    no cpap , "never gave me one"    Patient Active Problem List   Diagnosis Date Noted  . COPD exacerbation ([place]) 03/05/2018  . CAD (coronary artery disease) 03/05/2018  . Iron deficiency anemia 02/26/2018  . Sleep apnea 11/01/2017  . HLD (hyperlipidemia) 10/19/2017  . Anxiety and depression 10/19/2017  . Chest pain 10/19/2017  . Hypoglycemia   . Hypoxia   . Bronchitis 10/07/2017  . Chronic combined systolic and diastolic CHF (congestive heart failure) ([place]) 10/07/2017  . Acute renal failure superimposed on stage 3 chronic kidney disease  ([place]) 10/07/2017  . Hypokalemia 10/07/2017  . Normocytic normochromic anemia 10/07/2017  . Type II diabetes mellitus with renal manifestations ([place])   . Leukocytosis   . Acute blood loss anemia   . Seizures ([place])   . Coronary artery disease involving native coronary artery of native heart with angina pectoris ([place]) 09/05/2017  . History of cardiac arrest 09/03/2017  . Cardiopulmonary arrest ([place])   . Acute encephalopathy   . Acute respiratory failure with hypoxia ([place])   . Acute on chronic combined systolic and diastolic CHF (congestive heart failure) ([place]) 08/29/2017  . Essential hypertension 08/29/2017  . Type 1 diabetes mellitus without complication ([place]) 28/41/3244  . Tobacco abuse 08/29/2017  . Acute pulmonary edema ([facility])   . CKD (chronic kidney disease)   . Dyspnea on exertion     Past Surgical History:  Procedure Laterality Date  . APPENDECTOMY    . BACK SURGERY    . LEFT HEART CATH AND CORONARY ANGIOGRAPHY N/A 09/11/2017   Procedure: LEFT HEART CATH AND CORONARY ANGIOGRAPHY;  Surgeon: Martinique, Peter M, MD;  Location: [facility];  Service: Cardiovascular;  Laterality: N/A;  . TOTAL KNEE ARTHROPLASTY Right 2007     OB History   None      Home Medications    Prior to Admission medications   Medication Sig Start Date End Date Taking? Authorizing Provider  ACCU-CHEK  SOFTCLIX LANCETS lancets Use as instructed 10/16/17   Brayton Caves, PA-C  amLODipine (NORVASC) 10 MG tablet Take 1 tablet (10 mg total) by mouth daily. MUST MAKE APPT FOR FURTHER REFILLS 06/24/18   Charlott Rakes, MD  aspirin EC 81 MG tablet Take 1 tablet (81 mg total) by mouth daily. 10/16/17   Brayton Caves, PA-C  Blood Glucose Monitoring Suppl (ACCU-CHEK AVIVA) device Use as instructed 10/16/17 10/16/18  Brayton Caves, PA-C  Blood Glucose Monitoring Suppl (TRUE METRIX METER) DEVI 1 kit by Does not apply route 4 (four) times daily. 10/16/17   Brayton Caves, PA-C  busPIRone (BUSPAR) 5 MG tablet Take  1 tablet (5 mg total) by mouth 3 (three) times daily. 01/21/18   Charlott Rakes, MD  carvedilol (COREG) 25 MG tablet TAKE 1 TABLET BY MOUTH 2 (TWO) TIMES DAILY WITH A MEAL. Patient taking differently: Take 25 mg by mouth 2 (two) times daily with a meal.  05/29/18   Charlott Rakes, MD  cetirizine (ZYRTEC) 10 MG tablet TAKE 1 TABLET (10 MG TOTAL) BY MOUTH DAILY. 03/11/18   Charlott Rakes, MD  furosemide (LASIX) 40 MG tablet Take 2 tablets (80 mg total) by mouth every morning AND 1 tablet (40 mg total) every evening. 02/24/18 02/19/19  Croitoru, Mihai, MD  gabapentin (NEURONTIN) 300 MG capsule Take 1 capsule (300 mg total) by mouth 3 (three) times daily. 01/21/18   Charlott Rakes, MD  glucose blood (ACCU-CHEK AVIVA) test strip Use as instructed 10/16/17   Brayton Caves, PA-C  glucose blood (TRUE METRIX BLOOD GLUCOSE TEST) test strip Use as instructed 10/16/17   Brayton Caves, PA-C  guaiFENesin-dextromethorphan (ROBITUSSIN DM) 100-10 MG/5ML syrup Take 5 mLs by mouth every 4 (four) hours as needed for cough. 10/08/17   Mikhail, Velta Addison, DO  hydrALAZINE (APRESOLINE) 100 MG tablet Take 1 tablet (100 mg total) by mouth 3 (three) times daily. 01/21/18   Charlott Rakes, MD  Hyoscyamine Sulfate SL (LEVSIN/SL) 0.125 MG SUBL Place 1 tablet under the tongue every 4 (four) hours as needed. 05/07/18   Charlann Lange, PA-C  insulin aspart (NOVOLOG) 100 UNIT/ML injection Inject 4 Units into the skin 3 (three) times daily with meals. 01/21/18   Charlott Rakes, MD  Insulin Glargine (LANTUS SOLOSTAR) 100 UNIT/ML Solostar Pen Inject 20 Units into the skin 2 (two) times daily. Patient taking differently: Inject 20-80 Units into the skin See admin instructions. Use 80 units every morning then use 20 units at night 01/21/18   Charlott Rakes, MD  Insulin Pen Needle (TRUEPLUS PEN NEEDLES) 31G X 6 MM MISC Use as directed 11/08/17   Charlott Rakes, MD  ipratropium-albuterol (DUONEB) 0.5-2.5 (3) MG/3ML SOLN Take 3 mLs by nebulization  every 6 (six) hours as needed. 03/07/18   Kayleen Memos, DO  ketoconazole (NIZORAL) 2 % shampoo Daily for the next week then twice a week until resolution 02/17/18   Augusto Gamble B, NP  montelukast (SINGULAIR) 10 MG tablet Take 1 tablet (10 mg total) by mouth at bedtime. 01/21/18   Charlott Rakes, MD  potassium chloride SA (K-DUR,KLOR-CON) 10 MEQ tablet Take 1 tablet (10 mEq total) by mouth daily. 01/21/18   Charlott Rakes, MD  rosuvastatin (CRESTOR) 40 MG tablet Take 1 tablet (40 mg total) by mouth daily. 01/21/18 06/27/18  Charlott Rakes, MD  spironolactone (ALDACTONE) 50 MG tablet Take 1 tablet (50 mg total) by mouth daily. 01/21/18   Charlott Rakes, MD  TRUEPLUS LANCETS 28G MISC 28 g by Does  not apply route 4 (four) times daily. 10/16/17   Brayton Caves, PA-C    Family History Family History  Problem Relation Age of Onset  . Hypertension Mother   . Colon cancer Neg Hx   . Esophageal cancer Neg Hx     Social History Social History   Tobacco Use  . Smoking status: Never Smoker  . Smokeless tobacco: Never Used  Substance Use Topics  . Alcohol use: No    Frequency: Never  . Drug use: No     Allergies   Eggs or egg-derived products; Azithromycin; and Cherry   Review of Systems Review of Systems  Cardiovascular: Positive for chest pain.  All other systems reviewed and are negative.    Physical Exam Updated Vital Signs BP 126/76 (BP Location: Right Arm)   Pulse 68   Resp 16   SpO2 95%   Physical Exam  Constitutional: She is oriented to person, place, and time. She appears well-developed and well-nourished.  HENT:  Head: Normocephalic and atraumatic.  Cardiovascular: Normal rate and regular rhythm.  No murmur heard. Pulmonary/Chest: Effort normal. No respiratory distress.  Crackles in bilateral bases  Abdominal: Soft. There is no tenderness. There is no rebound and no guarding.  Musculoskeletal: She exhibits no edema.  TTP over left shoulder, arm. 2+ radial and  DP pulses  Neurological: She is alert and oriented to person, place, and time.  Skin: Skin is warm and dry.  Psychiatric: She has a normal mood and affect. Her behavior is normal.  Nursing note and vitals reviewed.    ED Treatments / Results  Labs (all labs ordered are listed, but only abnormal results are displayed) Labs Reviewed  COMPREHENSIVE METABOLIC PANEL - Abnormal; Notable for the following components:      Result Value   Glucose, Bld 112 (*)    BUN 39 (*)    Creatinine, Ser 2.56 (*)    GFR calc non Af Amer 19 (*)    GFR calc Af Amer 22 (*)    All other components within normal limits  CBC WITH DIFFERENTIAL/PLATELET - Abnormal; Notable for the following components:   Hemoglobin 10.2 (*)    HCT 32.4 (*)    MCV 78.3 (*)    MCH 24.6 (*)    RDW 18.4 (*)    All other components within normal limits  TROPONIN I  BRAIN NATRIURETIC PEPTIDE    EKG EKG Interpretation  Date/Time:  Friday June 27 2018 12:41:12 EST Ventricular Rate:  63 PR Interval:    QRS Duration: 102 QT Interval:  418 QTC Calculation: 428 R Axis:   -55 Text Interpretation:  Sinus rhythm Abnormal R-wave progression, late transition Inferior infarct, old Baseline wander in lead(s) II aVR aVF Confirmed by Quintella Reichert [phone]) on 06/27/2018 12:47:55 PM   Radiology Dg Chest 2 View  Result Date: 06/27/2018 CLINICAL DATA:  Chest pain, hypertension, diabetes, history of CHF EXAM: CHEST - 2 VIEW COMPARISON:  05/07/2018 FINDINGS: Mild cardiac enlargement with vascular and interstitial prominence diffusely suggesting early interstitial edema. No large effusion or pneumothorax. No significant collapse or consolidation. Trachea is midline. Aorta atherosclerotic. Degenerative changes of the spine. Monitor leads overlie the chest. IMPRESSION: Mild cardiac enlargement with diffuse interstitial edema pattern suspicious for early developing CHF. Electronically Signed   By: Jerilynn Mages.  Shick M.D.   On: 06/27/2018 14:06     Procedures Procedures (including critical care time)  Medications Ordered in ED Medications  fentaNYL (SUBLIMAZE) injection 50 mcg (50 mcg Intravenous  Given 06/27/18 1420)  furosemide (LASIX) injection 80 mg (80 mg Intravenous Given 06/27/18 1514)     Initial Impression / Assessment and Plan / ED Course  I have reviewed the triage vital signs and the nursing notes.  Pertinent labs & imaging results that were available during my care of the patient were reviewed by me and considered in my medical decision making (see chart for details).    patient with history of CKD, CHF here for evaluation of chest pain as well as progressive shortness of breath. She does have by [place] crackles on examination without respiratory distress. In terms of her chest pain this is reproducible and more consistent with musculoskeletal pain. Given her dyspnea and crackles plan to admit for observation and diuresis.  Medicine consulted for observation.   Final Clinical Impressions(s) / ED Diagnoses   Final diagnoses:  None    ED Discharge Orders    None       Quintella Reichert, MD 06/27/18 1658

## 2018-06-27 NOTE — ED Notes (Signed)
HH tray ordered 

## 2018-06-27 NOTE — ED Notes (Signed)
Unsuccessful attempt to call report  Just had notifications  Of pts number

## 2018-06-27 NOTE — ED Notes (Signed)
C/o lt arm pain and lt shoulder pain  But no chest pain

## 2018-06-28 ENCOUNTER — Other Ambulatory Visit: Payer: Self-pay

## 2018-06-28 DIAGNOSIS — R0789 Other chest pain: Secondary | ICD-10-CM

## 2018-06-28 LAB — BASIC METABOLIC PANEL
Anion gap: 9 (ref 5–15)
BUN: 51 mg/dL — ABNORMAL HIGH (ref 8–23)
CALCIUM: 9.3 mg/dL (ref 8.9–10.3)
CO2: 27 mmol/L (ref 22–32)
CREATININE: 3.03 mg/dL — AB (ref 0.44–1.00)
Chloride: 106 mmol/L (ref 98–111)
GFR calc non Af Amer: 15 mL/min — ABNORMAL LOW (ref >60)
GFR, EST AFRICAN AMERICAN: 18 mL/min — AB (ref >60)
Glucose, Bld: 143 mg/dL — ABNORMAL HIGH (ref 70–99)
Potassium: 4.3 mmol/L (ref 3.5–5.1)
SODIUM: 142 mmol/L (ref 135–145)

## 2018-06-28 LAB — GLUCOSE, CAPILLARY
GLUCOSE-CAPILLARY: 185 mg/dL — AB (ref 70–99)
Glucose-Capillary: 139 mg/dL — ABNORMAL HIGH (ref 70–99)

## 2018-06-28 LAB — TROPONIN I

## 2018-06-28 LAB — HEMOGLOBIN A1C
Hgb A1c MFr Bld: 6.9 % — ABNORMAL HIGH (ref 4.8–5.6)
Mean Plasma Glucose: 151.33 mg/dL

## 2018-06-28 MED ORDER — NAPROXEN 500 MG PO TABS: 500.0000 mg | ORAL_TABLET | Freq: Two times a day (BID) | ORAL | 0 refills | Status: DC | PRN

## 2018-06-28 MED ORDER — ENOXAPARIN SODIUM 30 MG/0.3ML ~~LOC~~ SOLN: 30.0000 mg | SUBCUTANEOUS | Status: DC

## 2018-06-28 MED ORDER — CYCLOBENZAPRINE HCL 10 MG PO TABS: 10.0000 mg | ORAL_TABLET | Freq: Three times a day (TID) | ORAL | 0 refills | Status: DC | PRN

## 2018-06-28 NOTE — Discharge Instructions (Signed)
Joint Pain Joint pain can be caused by many things. The joint can be bruised, infected, weak from aging, or sore from exercise. The pain will probably go away if you follow your doctor's instructions for home care. If your joint pain continues, more tests may be needed to help find the cause of your condition. Follow these instructions at home: Watch your condition for any changes. Follow these instructions as told to lessen the pain that you are feeling:  Take medicines only as told by your doctor.  Rest the sore joint for as long as told by your doctor. If your doctor tells you to, raise (elevate) the painful joint above the level of your heart while you are sitting or lying down.  Do not do things that cause pain or make the pain worse.  If told, put ice on the painful area: ? Put ice in a plastic bag. ? Place a towel between your skin and the bag. ? Leave the ice on for 20 minutes, 2-3 times per day.  Wear an elastic bandage, splint, or sling as told by your doctor. Loosen the bandage or splint if your fingers or toes lose feeling (become numb) and tingle, or if they turn cold and blue.  Begin exercising or stretching the joint as told by your doctor. Ask your doctor what types of exercise are safe for you.  Keep all follow-up visits as told by your doctor. This is important.  Contact a doctor if:  Your pain gets worse and medicine does not help it.  Your joint pain does not get better in 3 days.  You have more bruising or swelling.  You have a fever.  You lose 10 pounds (4.5 kg) or more without trying. Get help right away if:  You are not able to move the joint.  Your fingers or toes become numb or they turn cold and blue. This information is not intended to replace advice given to you by your health care provider. Make sure you discuss any questions you have with your health care provider. Document Released: 07/18/2009 Document Revised: 01/05/2016 Document Reviewed:  05/11/2014 Elsevier Interactive Patient Education  2018 [place].    Heart Failure Heart failure means your heart has trouble pumping blood. This makes it hard for your body to work well. Heart failure is usually a long-term (chronic) condition. You must take good care of yourself and follow your doctor's treatment plan. Follow these instructions at home:  Take your heart medicine as told by your doctor. ? Do not stop taking medicine unless your doctor tells you to. ? Do not skip any dose of medicine. ? Refill your medicines before they run out. ? Take other medicines only as told by your doctor or pharmacist.  Stay active if told by your doctor. The elderly and people with severe heart failure should talk with a doctor about physical activity.  Eat heart-healthy foods. Choose foods that are without trans fat and are low in saturated fat, cholesterol, and salt (sodium). This includes fresh or frozen fruits and vegetables, fish, lean meats, fat-free or low-fat dairy foods, whole grains, and high-fiber foods. Lentils and dried peas and beans (legumes) are also good choices.  Limit salt if told by your doctor.  Cook in a healthy way. Roast, grill, broil, bake, poach, steam, or stir-fry foods.  Limit fluids as told by your doctor.  Weigh yourself every morning. Do this after you pee (urinate) and before you eat breakfast. Write down your  weight to give to your doctor.  Take your blood pressure and write it down if your doctor tells you to.  Ask your doctor how to check your pulse. Check your pulse as told.  Lose weight if told by your doctor.  Stop smoking or chewing tobacco. Do not use gum or patches that help you quit without your doctor's approval.  Schedule and go to doctor visits as told.  Nonpregnant women should have no more than 1 drink a day. Men should have no more than 2 drinks a day. Talk to your doctor about drinking alcohol.  Stop illegal drug use.  Stay  current with shots (immunizations).  Manage your health conditions as told by your doctor.  Learn to manage your stress.  Rest when you are tired.  If it is really hot outside: ? Avoid intense activities. ? Use air conditioning or fans, or get in a cooler place. ? Avoid caffeine and alcohol. ? Wear loose-fitting, lightweight, and light-colored clothing.  If it is really cold outside: ? Avoid intense activities. ? Layer your clothing. ? Wear mittens or gloves, a hat, and a scarf when going outside. ? Avoid alcohol.  Learn about heart failure and get support as needed.  Get help to maintain or improve your quality of life and your ability to care for yourself as needed. Contact a doctor if:  You gain weight quickly.  You are more short of breath than usual.  You cannot do your normal activities.  You tire easily.  You cough more than normal, especially with activity.  You have any or more puffiness (swelling) in areas such as your hands, feet, ankles, or belly (abdomen).  You cannot sleep because it is hard to breathe.  You feel like your heart is beating fast (palpitations).  You get dizzy or light-headed when you stand up. Get help right away if:  You have trouble breathing.  There is a change in mental status, such as becoming less alert or not being able to focus.  You have chest pain or discomfort.  You faint. This information is not intended to replace advice given to you by your health care provider. Make sure you discuss any questions you have with your health care provider. Document Released: 05/08/2008 Document Revised: 01/05/2016 Document Reviewed: 09/15/2012 Elsevier Interactive Patient Education  2017 [place] With Less [employer] with less salt is one way to reduce the amount of sodium you get from food. Depending on your condition and overall health, your health care provider or diet and nutrition specialist (dietitian) may  recommend that you reduce your sodium intake. Most people should have less than 2,300 milligrams (mg) of sodium each day. If you have high blood pressure (hypertension), you may need to limit your sodium to 1,500 mg each day. Follow the tips below to help reduce your sodium intake. What do I need to know about cooking with less salt? Shopping  Buy sodium-free or low-sodium products. Look for the following words on food labels: ? Low-sodium. ? Sodium-free. ? Reduced-sodium. ? No salt added. ? Unsalted.  Buy fresh or frozen vegetables. Avoid canned vegetables.  Avoid buying meats or protein foods that have been injected with broth or saline solution.  Avoid cured or smoked meats, such as hot dogs, bacon, salami, ham, and bologna. Reading food labels  Check the food label before buying or using packaged ingredients.  Look for products with no more than 140 mg of sodium in one  serving.  Do not choose foods with salt as one of the first three ingredients on the ingredients list. If salt is one of the first three ingredients, it usually means the item is high in sodium, because ingredients are listed in order of amount in the food item. Cooking  Use herbs, seasonings without salt, and spices as substitutes for salt in foods.  Use sodium-free baking soda when baking.  Grill, braise, or roast foods to add flavor with less salt.  Avoid adding salt to pasta, rice, or hot cereals while cooking.  Drain and rinse canned vegetables before use.  Avoid adding salt when cooking sweets and desserts.  Cook with low-sodium ingredients. What are some salt alternatives? The following are herbs, seasonings, and spices that can be used instead of salt to give taste to your food. Herbs should be fresh or dried. Do not choose packaged mixes. Next to the name of the herb, spice, or seasoning are some examples of foods you can pair it with. Herbs  Bay leaves - Soups, meat and vegetable dishes, and  spaghetti sauce.  Basil - [employer], soups, pasta, and fish dishes.  Cilantro - Meat, poultry, and vegetable dishes.  Chili powder - Marinades and Mexican dishes.  Chives - Salad dressings and potato dishes.  Cumin - Mexican dishes, couscous, and meat dishes.  Dill - Fish dishes, sauces, and salads.  Fennel - Meat and vegetable dishes, breads, and cookies.  Garlic (do not use garlic salt) - [place] dishes, meat dishes, salad dressings, and sauces.  Marjoram - Soups, potato dishes, and meat dishes.  Oregano - Pizza and spaghetti sauce.  Parsley - Salads, soups, pasta, and meat dishes.  Rosemary - [place] dishes, salad dressings, soups, and red meats.  Saffron - Fish dishes, pasta, and some poultry dishes.  Sage - Stuffings and sauces.  Tarragon - Fish and [employer].  Thyme - Stuffing, meat, and fish dishes. Seasonings  Lemon juice - Fish dishes, poultry dishes, vegetables, and salads.  Vinegar - Salad dressings, vegetables, and fish dishes. Spices  Cinnamon - Sweet dishes, such as cakes, cookies, and puddings.  Cloves - Gingerbread, puddings, and marinades for meats.  Curry - Vegetable dishes, fish and poultry dishes, and stir-fry dishes.  Ginger - Vegetables dishes, fish dishes, and stir-fry dishes.  Nutmeg - Pasta, vegetables, poultry, fish dishes, and custard. What are some low-sodium ingredients and foods?  Fresh or frozen fruits and vegetables with no sauce added.  Fresh or frozen whole meats, poultry, and fish with no sauce added.  Eggs.  Noodles, pasta, quinoa, rice.  Shredded or puffed wheat or puffed rice.  Regular or quick oats.  Milk, yogurt, hard cheeses, and low-sodium cheeses. Good cheese choices include Swiss, [place]. Always check the label for the serving size and sodium content.  Unsalted butter or margarine.  Unsalted nuts.  Sherbet or ice cream (keep to  cup per serving).  Homemade  pudding.  Sodium-free baking soda and baking powder. This is not a complete list of low-sodium ingredients and foods. Contact your dietitian for more options. Summary  Cooking with less salt is one way to reduce the amount of sodium that you get from food.  Buy sodium-free or low-sodium products.  Check the food label before using or buying packaged ingredients.  Use herbs, seasonings without salt, and spices as substitutes for salt in foods. This information is not intended to replace advice given to you by your health care provider. Make sure  you discuss any questions you have with your health care provider. Document Released: 07/30/2005 Document Revised: 08/07/2016 Document Reviewed: 08/07/2016 Elsevier Interactive Patient Education  2017 [employer].

## 2018-06-28 NOTE — Discharge Summary (Signed)
Physician Discharge Summary  Brenda Sims MBE:[id number] DOB: 07/22/1956 DOA: 06/27/2018  PCP: Charlott Rakes, MD  Admit date: 06/27/2018 Discharge date: 06/28/2018  Admitted From: Home Disposition: Home  Recommendations for Outpatient Follow-up:  1. Follow up with PCP in 1-2 weeks 2. Please obtain BMP/CBC in one week   Home Health: No Equipment/Devices: None Discharge Condition: Stable CODE STATUS: Full Diet recommendation: Heart Healthy   Brief/Interim Summary:  #) Left shoulder pain: Patient presented with left shoulder pain radiating up her neck.  It appeared to be musculoskeletal on examination.  She had a negative troponins and a negative EKG.  She was discharged home with symptomatic management with cycle Benzapril.  #) COPD: Patient is on 2 L at night.  She was not wheezing on presentation.  #) Type 2 diabetes: Patient was maintained on glargine and aspart.  She was maintained on sliding scale here.  #) Chronic diastolic heart failure: Patient was noted to have some congestion on chest x-ray but was not hypoxic.  She was given 1 dose of IV furosemide.  She may restart her home furosemide by mouth on discharge.  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  #) Hypertension/hyperlipidemia: Patient was continued on home hydralazine, losartan, spironolactone, rosuvastatin, all.  #) Coronary artery disease: Patient was continued on home aspirin, beta-blocker, arb.  #) Pain/psych: Patient was continued on home buspirone, gabapentin, fluoxetine  Discharge Diagnoses:  Active Problems:   Essential hypertension   CKD (chronic kidney disease)   History of cardiac arrest   Coronary artery disease involving native coronary artery of native heart with angina pectoris (HCC)   Type II diabetes mellitus with renal manifestations (HCC)   Chronic combined systolic and diastolic CHF (congestive heart failure) (HCC)   HLD (hyperlipidemia)   Anxiety and depression   Chest  pain   CHF exacerbation (HCC)   Left shoulder pain    Discharge Instructions  Discharge Instructions    Call MD for:  difficulty breathing, headache or visual disturbances   Complete by:  As directed    Call MD for:  hives   Complete by:  As directed    Call MD for:  persistant nausea and vomiting   Complete by:  As directed    Call MD for:  redness, tenderness, or signs of infection (pain, swelling, redness, odor or green/yellow discharge around incision site)   Complete by:  As directed    Call MD for:  severe uncontrolled pain   Complete by:  As directed    Call MD for:  temperature >100.4   Complete by:  As directed    Diet - low sodium heart healthy   Complete by:  As directed    Discharge instructions   Complete by:  As directed    Please follow-up with your primary care doctor in 1 week.   Increase activity slowly   Complete by:  As directed      Allergies as of 06/28/2018      Reactions   Eggs Or Egg-derived Products Shortness Of Breath, Rash   Azithromycin Swelling   Facial/throat swelling   Cherry Swelling   Facial swelling   Latex Rash      Medication List    TAKE these medications   amLODipine 10 MG tablet Commonly known as:  NORVASC Take 1 tablet (10 mg total) by mouth daily. MUST MAKE APPT FOR FURTHER REFILLS What changed:  additional instructions   aspirin EC 81 MG tablet Take 1 tablet (  81 mg total) by mouth daily. What changed:  when to take this   busPIRone 5 MG tablet Commonly known as:  BUSPAR Take 1 tablet (5 mg total) by mouth 3 (three) times daily.   carvedilol 25 MG tablet Commonly known as:  COREG TAKE 1 TABLET BY MOUTH 2 (TWO) TIMES DAILY WITH A MEAL. What changed:  See the new instructions.   cetirizine 10 MG tablet Commonly known as:  ZYRTEC TAKE 1 TABLET (10 MG TOTAL) BY MOUTH DAILY.   clotrimazole-betamethasone cream Commonly known as:  LOTRISONE Apply 1 application topically See admin instructions. Apply to affected  areas of scalp daily as directed   cyclobenzaprine 10 MG tablet Commonly known as:  FLEXERIL Take 1 tablet (10 mg total) by mouth 3 (three) times daily as needed for muscle spasms.   FLUoxetine 20 MG capsule Commonly known as:  PROZAC Take 20 mg by mouth daily.   furosemide 40 MG tablet Commonly known as:  LASIX Take 2 tablets (80 mg total) by mouth every morning AND 1 tablet (40 mg total) every evening.   gabapentin 300 MG capsule Commonly known as:  NEURONTIN Take 1 capsule (300 mg total) by mouth 3 (three) times daily.   glucose blood test strip Use as instructed   glucose blood test strip Use as instructed   guaiFENesin-dextromethorphan 100-10 MG/5ML syrup Commonly known as:  ROBITUSSIN DM Take 5 mLs by mouth every 4 (four) hours as needed for cough.   hydrALAZINE 100 MG tablet Commonly known as:  APRESOLINE Take 1 tablet (100 mg total) by mouth 3 (three) times daily.   Hyoscyamine Sulfate SL 0.125 MG Subl Place 1 tablet under the tongue every 4 (four) hours as needed. What changed:  reasons to take this   insulin aspart 100 UNIT/ML injection Commonly known as:  novoLOG Inject 4 Units into the skin 3 (three) times daily with meals. What changed:  when to take this   Insulin Glargine 100 UNIT/ML Solostar Pen Commonly known as:  LANTUS Inject 20 Units into the skin 2 (two) times daily. What changed:  when to take this   Insulin Pen Needle 31G X 6 MM Misc Use as directed   ipratropium-albuterol 0.5-2.5 (3) MG/3ML Soln Commonly known as:  DUONEB Take 3 mLs by nebulization every 6 (six) hours as needed.   ketoconazole 2 % shampoo Commonly known as:  NIZORAL Daily for the next week then twice a week until resolution   losartan 100 MG tablet Commonly known as:  COZAAR Take 100 mg by mouth daily.   montelukast 10 MG tablet Commonly known as:  SINGULAIR Take 1 tablet (10 mg total) by mouth at bedtime.   potassium chloride 10 MEQ tablet Commonly known as:   K-DUR,KLOR-CON Take 1 tablet (10 mEq total) by mouth daily.   potassium chloride 10 MEQ tablet Commonly known as:  K-DUR Take 10 mEq by mouth daily.   rosuvastatin 40 MG tablet Commonly known as:  CRESTOR Take 1 tablet (40 mg total) by mouth daily.   spironolactone 50 MG tablet Commonly known as:  ALDACTONE Take 1 tablet (50 mg total) by mouth daily.   TRUE METRIX METER Devi 1 kit by Does not apply route 4 (four) times daily.   ACCU-CHEK AVIVA device Use as instructed   TRUEPLUS LANCETS 28G Misc 28 g by Does not apply route 4 (four) times daily.   ACCU-CHEK SOFTCLIX LANCETS lancets Use as instructed       Allergies  Allergen Reactions  .  Eggs Or Egg-Derived Products Shortness Of Breath and Rash  . Azithromycin Swelling    Facial/throat swelling  . Cherry Swelling    Facial swelling  . Latex Rash    Consultations:  None   Procedures/Studies: Dg Chest 2 View  Result Date: 06/27/2018 CLINICAL DATA:  Chest pain, hypertension, diabetes, history of CHF EXAM: CHEST - 2 VIEW COMPARISON:  05/07/2018 FINDINGS: Mild cardiac enlargement with vascular and interstitial prominence diffusely suggesting early interstitial edema. No large effusion or pneumothorax. No significant collapse or consolidation. Trachea is midline. Aorta atherosclerotic. Degenerative changes of the spine. Monitor leads overlie the chest. IMPRESSION: Mild cardiac enlargement with diffuse interstitial edema pattern suspicious for early developing CHF. Electronically Signed   By: Jerilynn Mages.  Shick M.D.   On: 06/27/2018 14:06      Subjective:   Discharge Exam: Vitals:   06/27/18 2109 06/28/18 0549  BP: (!) 112/49 (!) 120/57  Pulse: 71 70  Resp: (!) 26 17  Temp: 98.3 F (36.8 C) 97.7 F (36.5 C)  SpO2:  100%   Vitals:   06/27/18 1938 06/27/18 1952 06/27/18 2109 06/28/18 0549  BP: 113/81 113/81 (!) 112/49 (!) 120/57  Pulse: 81 85 71 70  Resp: 20  (!) 26 17  Temp:   98.3 F (36.8 C) 97.7 F (36.5  C)  TempSrc:   Oral Oral  SpO2: 93% 97%  100%  Weight:    87.1 kg    General: Pt is alert, awake, not in acute distress Cardiovascular: RRR, S1/S2 +, no rubs, no gallops Respiratory: CTA bilaterally, no wheezing, no rhonchi Abdominal: Soft, NT, ND, bowel sounds + Extremities: no edema, left arm with intact pulses but pain on abduction and palpation over trapezius and neck, no midline tenderness    The results of significant diagnostics from this hospitalization (including imaging, microbiology, ancillary and laboratory) are listed below for reference.     Microbiology: No results found for this or any previous visit (from the past 240 hour(s)).   Labs: BNP (last 3 results) Recent Labs    10/19/17 2016 03/05/18 2149 06/27/18 1255  BNP 1,539.6* 32.4 16.1   Basic Metabolic Panel: Recent Labs  Lab 06/27/18 1255 06/27/18 1943 06/28/18 0612  NA 139  --  142  K 4.0  --  4.3  CL 104  --  106  CO2 23  --  27  GLUCOSE 112*  --  143*  BUN 39*  --  51*  CREATININE 2.56* 2.72* 3.03*  CALCIUM 9.4  --  9.3   Liver Function Tests: Recent Labs  Lab 06/27/18 1255  AST 21  ALT 14  ALKPHOS 54  BILITOT 0.4  PROT 7.5  ALBUMIN 3.6   No results for input(s): LIPASE, AMYLASE in the last 168 hours. No results for input(s): AMMONIA in the last 168 hours. CBC: Recent Labs  Lab 06/27/18 1255 06/27/18 1943  WBC 9.6 10.0  NEUTROABS 6.3  --   HGB 10.2* 10.6*  HCT 32.4* 33.6*  MCV 78.3* 77.8*  PLT 233 245   Cardiac Enzymes: Recent Labs  Lab 06/27/18 1255 06/27/18 1943 06/28/18 0003 06/28/18 0612  TROPONINI <0.03 <0.03 <0.03 <0.03   BNP: Invalid input(s): POCBNP CBG: Recent Labs  Lab 06/27/18 2110 06/28/18 0733  GLUCAP 187* 185*   D-Dimer No results for input(s): DDIMER in the last 72 hours. Hgb A1c Recent Labs    06/28/18 0003  HGBA1C 6.9*   Lipid Profile No results for input(s): CHOL, HDL, LDLCALC, TRIG, CHOLHDL, LDLDIRECT  in the last 72  hours. Thyroid function studies No results for input(s): TSH, T4TOTAL, T3FREE, THYROIDAB in the last 72 hours.  Invalid input(s): FREET3 Anemia work up No results for input(s): VITAMINB12, FOLATE, FERRITIN, TIBC, IRON, RETICCTPCT in the last 72 hours. Urinalysis    Component Value Date/Time   COLORURINE YELLOW 05/06/2018 2259   APPEARANCEUR HAZY (A) 05/06/2018 2259   LABSPEC 1.010 05/06/2018 2259   PHURINE 5.0 05/06/2018 2259   GLUCOSEU NEGATIVE 05/06/2018 2259   HGBUR NEGATIVE 05/06/2018 2259   BILIRUBINUR NEGATIVE 05/06/2018 2259   KETONESUR NEGATIVE 05/06/2018 2259   PROTEINUR NEGATIVE 05/06/2018 2259   NITRITE NEGATIVE 05/06/2018 2259   LEUKOCYTESUR NEGATIVE 05/06/2018 2259   Sepsis Labs Invalid input(s): PROCALCITONIN,  WBC,  LACTICIDVEN Microbiology No results found for this or any previous visit (from the past 240 hour(s)).   Time coordinating discharge: 35 SIGNED:   Cristy Folks, MD  [facility] 06/28/2018, 9:46 AM  If 7PM-7AM, please contact night-coverage [email or web address] Password [email or web address]

## 2018-07-01 ENCOUNTER — Telehealth: Payer: Self-pay | Admitting: Cardiovascular Disease

## 2018-07-01 NOTE — Telephone Encounter (Signed)
Received call from patient.Stated she has been having chest pain with pain radiating down left arm.Rates pain a # 7 at present.Stated she was discharged from [facility] this past Saturday.States she was having chest pain on discharge and has been having off and on since.Trish notified.

## 2018-07-01 NOTE — Telephone Encounter (Signed)
New Message   Pt c/o of Chest Pain: STAT if CP now or developed within 24 hours  1. Are you having CP right now? yes  2. Are you experiencing any other symptoms (ex. SOB, nausea, vomiting, sweating)? Shortness of breath   3. How long have you been experiencing CP? Since last week   4. Is your CP continuous or coming and going? Coming and going  5. Have you taken Nitroglycerin? No    Patient states that she went to the ER on 11/15 she states that since the pain has been more present not only in her chest but also in her back.   ?

## 2018-07-03 ENCOUNTER — Encounter: Payer: Self-pay | Admitting: Cardiovascular Disease

## 2018-07-03 ENCOUNTER — Ambulatory Visit: Payer: Medicaid Other | Admitting: Physician Assistant

## 2018-07-04 ENCOUNTER — Encounter: Payer: Self-pay | Admitting: Internal Medicine

## 2018-07-04 ENCOUNTER — Ambulatory Visit (INDEPENDENT_AMBULATORY_CARE_PROVIDER_SITE_OTHER): Payer: Medicaid Other | Admitting: Internal Medicine

## 2018-07-04 DIAGNOSIS — R059 Cough, unspecified: Secondary | ICD-10-CM

## 2018-07-04 DIAGNOSIS — R05 Cough: Secondary | ICD-10-CM | POA: Diagnosis not present

## 2018-07-04 DIAGNOSIS — R0609 Other forms of dyspnea: Secondary | ICD-10-CM

## 2018-07-04 DIAGNOSIS — R058 Other specified cough: Secondary | ICD-10-CM

## 2018-07-04 MED ORDER — PANTOPRAZOLE SODIUM 40 MG PO TBEC: 40.0000 mg | DELAYED_RELEASE_TABLET | Freq: Every day | ORAL | 2 refills | Status: DC

## 2018-07-04 MED ORDER — FAMOTIDINE 20 MG PO TABS: ORAL_TABLET | ORAL | 11 refills | Status: DC

## 2018-07-04 NOTE — Progress Notes (Signed)
Brenda Sims, female    DOB: [birth year]      MRN: [id number]   Brief patient profile:  42 yobf  never smoker carries dx of copd with nl pfts 07/04/2018 s/p admit:    Admit date: 06/27/2018 Discharge date: 06/28/2018  Brief/Interim Summary:  #) Left shoulder pain: Patient presented with left shoulder pain radiating up her neck.  It appeared to be musculoskeletal on examination.  She had a negative troponins and a negative EKG.  She was discharged home with symptomatic management with cycle Benzapril.  #) COPD: Patient is on 2 L at night.  She was not wheezing on presentation.  #) Type 2 diabetes: Patient was maintained on glargine and aspart.  She was maintained on sliding scale here.  #) Chronic diastolic heart failure: Patient was noted to have some congestion on chest x-ray but was not hypoxic.  She was given 1 dose of IV furosemide.  She may restart her home furosemide by mouth on discharge.  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  #) Hypertension/hyperlipidemia: Patient was continued on home hydralazine, losartan, spironolactone, rosuvastatin, all.  #) Coronary artery disease: Patient was continued on home aspirin, beta-blocker, arb.  #) Pain/psych: Patient was continued on home buspirone, gabapentin, fluoxetine  Discharge Diagnoses:  Active Problems:   Essential hypertension   CKD (chronic kidney disease)   History of cardiac arrest   Coronary artery disease involving native coronary artery of native heart with angina pectoris (HCC)   Type II diabetes mellitus with renal manifestations (HCC)   Chronic combined systolic and diastolic CHF (congestive heart failure) (HCC)   HLD (hyperlipidemia)   Anxiety and depression   Chest pain   CHF exacerbation ([place])   Left shoulder pain     07/04/2018  Pulmonary/ 1st office eval/Khaylee Mcevoy  Chief Complaint  Patient presents with  . Pulmonary Consult    Referred by "[facility]" per pt. Pt c/o SOB since Jan 2019.   She states she gets SOB with exertion such as taking a shower. She is also c/o cough with light yellow sputum.     Dyspnea:  Sob in shower but says can walk anywhere at nl pace, does not do steps Cough: x 10 m severe esp at hs min yellow mucus, ? Some better p abx,assoc with sensation of sinus congestion / drainage year round x years  Sleep: on cpap, disrupted by cough  SABA use: has duoneb, usually doesn't help   No obvious day to day or daytime variability or assoc  mucus plugs or hemoptysis or cp or chest tightness, subjective wheeze or overt sinus or hb symptoms.     Also denies any obvious fluctuation of symptoms with weather or environmental changes or other aggravating or alleviating factors except as outlined above   No unusual exposure hx or h/o childhood pna/ asthma or knowledge of premature birth.  Current Allergies, Complete Past Medical History, Past Surgical History, Family History, and Social History were reviewed in [employer] record.  ROS  The following are not active complaints unless bolded Hoarseness, sore throat, dysphagia, dental problems, itching, sneezing,  nasal congestion or discharge of excess mucus or purulent secretions, ear ache,   fever, chills, sweats, unintended wt loss or wt gain, classically pleuritic or exertional cp,  orthopnea pnd or arm/hand swelling  or leg swelling, presyncope, palpitations, abdominal pain, anorexia, nausea, vomiting, diarrhea  or change in bowel habits or change in bladder habits, change in stools or change in  urine, dysuria, hematuria,  rash, arthralgias, visual complaints, headache, numbness, weakness or ataxia or problems with walking or coordination,  change in mood or  memory.           Past Medical History:  Diagnosis Date  . Anemia   . Anxiety   . CAD (coronary artery disease)   . CHF (congestive heart failure) ([place])   . CKD (chronic kidney disease)   . COPD (chronic obstructive pulmonary disease)  ([place])   . Depression   . Diabetes mellitus without complication ([place])   . Hypertension   . Hypothyroid   . MI (myocardial infarction) ([place])   . Sleep apnea    no cpap , "never gave me one"    Outpatient Medications Prior to Visit  Medication Sig Dispense Refill  . ACCU-CHEK SOFTCLIX LANCETS lancets Use as instructed 100 each 12  . amLODipine (NORVASC) 10 MG tablet Take 1 tablet (10 mg total) by mouth daily. MUST MAKE APPT FOR FURTHER REFILLS (Patient taking differently: Take 10 mg by mouth daily. ) 30 tablet 0  . aspirin EC 81 MG tablet Take 1 tablet (81 mg total) by mouth daily. (Patient taking differently: Take 81 mg by mouth 2 (two) times daily. ) 150 tablet 0  . Blood Glucose Monitoring Suppl (ACCU-CHEK AVIVA) device Use as instructed 1 each 0  . Blood Glucose Monitoring Suppl (TRUE METRIX METER) DEVI 1 kit by Does not apply route 4 (four) times daily. 1 Device 0  . busPIRone (BUSPAR) 5 MG tablet Take 1 tablet (5 mg total) by mouth 3 (three) times daily. 90 tablet 3  . carvedilol (COREG) 25 MG tablet TAKE 1 TABLET BY MOUTH 2 (TWO) TIMES DAILY WITH A MEAL. (Patient taking differently: Take 25 mg by mouth 2 (two) times daily with a meal. ) 60 tablet 2  . cetirizine (ZYRTEC) 10 MG tablet TAKE 1 TABLET (10 MG TOTAL) BY MOUTH DAILY. 30 tablet 2  . clotrimazole-betamethasone (LOTRISONE) cream Apply 1 application topically See admin instructions. Apply to affected areas of scalp daily as directed  2  . cyclobenzaprine (FLEXERIL) 10 MG tablet Take 1 tablet (10 mg total) by mouth 3 (three) times daily as needed for muscle spasms. 30 tablet 0  . FLUoxetine (PROZAC) 20 MG capsule Take 20 mg by mouth daily.  3  . furosemide (LASIX) 40 MG tablet Take 2 tablets (80 mg total) by mouth every morning AND 1 tablet (40 mg total) every evening. 270 tablet 3  . gabapentin (NEURONTIN) 300 MG capsule Take 1 capsule (300 mg total) by mouth 3 (three) times daily. 90 capsule 3  . glucose blood (ACCU-CHEK AVIVA)  test strip Use as instructed 100 each 12  . glucose blood (TRUE METRIX BLOOD GLUCOSE TEST) test strip Use as instructed 120 each 12  . guaiFENesin-dextromethorphan (ROBITUSSIN DM) 100-10 MG/5ML syrup Take 5 mLs by mouth every 4 (four) hours as needed for cough. 118 mL 0  . hydrALAZINE (APRESOLINE) 100 MG tablet Take 1 tablet (100 mg total) by mouth 3 (three) times daily. 90 tablet 3  . Hyoscyamine Sulfate SL (LEVSIN/SL) 0.125 MG SUBL Place 1 tablet under the tongue every 4 (four) hours as needed. (Patient taking differently: Place 1 tablet under the tongue every 4 (four) hours as needed (for abdominal cramping). ) 12 each 0  . insulin aspart (NOVOLOG) 100 UNIT/ML injection Inject 4 Units into the skin 3 (three) times daily with meals. (Patient taking differently: Inject 4 Units into the skin 3 (three)  times daily after meals. ) 10 mL 3  . Insulin Glargine (LANTUS SOLOSTAR) 100 UNIT/ML Solostar Pen Inject 20 Units into the skin 2 (two) times daily. (Patient taking differently: Inject 20 Units into the skin 3 (three) times daily. ) 5 pen 3  . Insulin Pen Needle (TRUEPLUS PEN NEEDLES) 31G X 6 MM MISC Use as directed 100 each 2  . ipratropium-albuterol (DUONEB) 0.5-2.5 (3) MG/3ML SOLN Take 3 mLs by nebulization every 6 (six) hours as needed. 360 mL 0  . ketoconazole (NIZORAL) 2 % shampoo Daily for the next week then twice a week until resolution 120 mL 0  . losartan (COZAAR) 100 MG tablet Take 100 mg by mouth daily.  3  . montelukast (SINGULAIR) 10 MG tablet Take 1 tablet (10 mg total) by mouth at bedtime. 30 tablet 3  . potassium chloride SA (K-DUR,KLOR-CON) 10 MEQ tablet Take 1 tablet (10 mEq total) by mouth daily. 30 tablet 3  . rosuvastatin (CRESTOR) 40 MG tablet Take 1 tablet (40 mg total) by mouth daily. 30 tablet 3  . spironolactone (ALDACTONE) 50 MG tablet Take 1 tablet (50 mg total) by mouth daily. 30 tablet 3  . TRUEPLUS LANCETS 28G MISC 28 g by Does not apply route 4 (four) times daily. 120  each 3   No facility-administered medications prior to visit.      Objective:     BP 122/74 (BP Location: Left Arm, Cuff Size: Normal)   Pulse 80   Ht 5' 4.5" (1.638 m)   Wt 193 lb (87.5 kg)   SpO2 93%   BMI 32.62 kg/m   SpO2: 93 % RA   amb bf nad/ easily confused with details of care/ harsh upper airway pattern cough with deep insp   HEENT: nl   turbinates bilaterally, and oropharynx. Nl external ear canals without cough reflex Modified Mallampati Score =   1  - full dentures    NECK :  without JVD/Nodes/TM/ nl carotid upstrokes bilaterally   LUNGS: no acc muscle use,  Nl contour chest which is clear to A and P bilaterally with  cough on insp  maneuvers   CV:  RRR  no s3 or murmur or increase in P2, and no edema   ABD:  Quite obese soft and nontender with nl inspiratory excursion in the supine position. No bruits or organomegaly appreciated, bowel sounds nl  MS:  Nl gait/ ext warm without deformities, calf tenderness, cyanosis or clubbing No obvious joint restrictions   SKIN: warm and dry without lesions    NEURO:  alert, approp, nl sensorium with  no motor or cerebellar deficits apparent.       I personally reviewed images and agree with radiology impression as follows:  CXR:   06/27/18  Mild cardiac enlargement with diffuse interstitial edema pattern suspicious for early developing CHF.   Labs ordered 07/04/2018  Allergy profile     Assessment   Dyspnea on exertion Echo 02/26/18  - Left ventricle: The cavity size was normal. Wall thickness was   increased in a pattern of mild LVH. Systolic function was normal.   The estimated ejection fraction was 50%. Hypokinesis of the   basalinferoseptal myocardium. Features are consistent with a   pseudonormal left ventricular filling pattern, with concomitant   abnormal relaxation and increased filling pressure (grade 2   diastolic dysfunction). - Aortic valve: Transvalvular velocity was within the normal  range.   There was no stenosis. There was no regurgitation. - Mitral valve:  There was trivial regurgitation. Valve area by   continuity equation (using LVOT flow): 2.11 cm^2. - Left atrium: The atrium was severely dilated. - Right ventricle: Systolic function was normal. - Right atrium: The atrium was moderately dilated. - Tricuspid valve: There was trivial regurgitation. - Pulmonic valve: There was no significant regurgitation. Spirometry 07/04/2018  FEV1 2.40 (117%)  Ratio 90 s curvature off all rx     Sob in shower but not with extended walking ? Etiology   Symptoms are markedly disproportionate to objective findings and not clear to what extent this is actually a pulmonary  problem but pt does appear to have difficult to sort out respiratory symptoms of unknown origin for which  DDX  = almost all start with A and  include Adherence, Ace Inhibitors, Acid Reflux, Active Sinus Disease, Alpha 1 Antitripsin deficiency, Anxiety masquerading as Airways dz,  ABPA,  Allergy(esp in young), Aspiration (esp in elderly), Adverse effects of meds,  Active smoking or Vaping, A bunch of PE's/clot burden (a few small clots can't cause this syndrome unless there is already severe underlying pulm or vascular dz with poor reserve),  Anemia or thyroid disorder, plus two Bs  = Bronchiectasis and Beta blocker use..and one C= CHF   Adherence is always the initial "prime suspect" and is a multilayered concern that requires a "trust but verify" approach in every patient - starting with knowing how to use medications, especially inhalers, correctly, keeping up with refills and understanding the fundamental difference between maintenance and prns vs those medications only taken for a very short course and then stopped and not refilled.  - very easily confused with details of care/ meds - return with all meds in hand using a trust but verify approach to confirm accurate Medication  Reconciliation The principal here is that  until we are certain that the  patients are doing what we've asked, it makes no sense to ask them to do more.    ? Acid (or non-acid) GERD > always difficult to exclude as up to 75% of pts in some series report no assoc GI/ Heartburn symptoms> rec max (24h)  acid suppression and diet restrictions/ reviewed and instructions given in writing.    ? Active sinus dz > sinus ct ordered  ? Adverse effects of meds > none of the usual suspects listed   ? Allergy/ asthma > send allergy profile , continue singulair/ zyrtec and prn duoneb but this is not nor ever has been copd based on hex of never smoking and nl pfts today  ? Anxiety > usually at the bottom of this list of usual suspects but should be much higher on this pt's based on H and P and note already on psychotropics and may interfere with adherence and also interpretation of response or lack thereof to symptom management which can be quite subjective.   ? BB effects :  On moderately high doses of coreg, so if starts wheezing rather than escalate bronchodilators : In the setting of respiratory symptoms of unknown etiology,  It would be preferable to use bystolic, the most beta -1  selective Beta blocker available in sample form, with bisoprolol the most selective generic choice  on the market, at least on a trial basis, to make sure the spillover Beta 2 effects of the less specific Beta blockers are not contributing to this patient's symptoms.    ? Chf/ cardiac asthma would be the likely culprit here based on cxr review.  rx per PCP/  cards. Presently not vol overloaded.  See echo 02/26/18 reviewed with pt    Upper airway cough syndrome Allergy profile 07/04/2018 >  Eos 0. /  IgE  Ordered  - sinus CT pending    Upper airway cough syndrome (previously labeled PNDS),  is so named because it's frequently impossible to sort out how much is  CR/sinusitis with freq throat clearing (which can be related to primary GERD)   vs  causing  secondary ("  extra esophageal")  GERD from wide swings in gastric pressure that occur with throat clearing, often  promoting self use of mint and menthol lozenges that reduce the lower esophageal sphincter tone and exacerbate the problem further in a cyclical fashion.   These are the same pts (now being labeled as having "irritable larynx syndrome" by some cough centers) who not infrequently have a history of having failed to tolerate ace inhibitors,  dry powder inhalers or biphosphonates or report having atypical/extraesophageal reflux symptoms that don't respond to standard doses of PPI  and are easily confused as having aecopd or asthma flares by even experienced allergists/ pulmonologists (myself included).    rec check sinus ct / hold abx for now and rx max for gerd then return in 4 weeks with all meds in hand using a trust but verify approach to confirm accurate Medication  Reconciliation The principal here is that until we are certain that the  patients are doing what we've asked, it makes no sense to ask them to do more.        Total time devoted to counseling  > 50 % of initial 60 min office visit:  review case with pt/fiance discussion of options/alternatives/ personally creating written customized instructions  in presence of pt  then going over those specific  Instructions directly with the pt including how to use all of the meds but in particular covering each new medication in detail and the difference between the maintenance= "automatic" meds and the prns using an action plan format for the latter (If this problem/symptom => do that organization reading Left to right).  Please see AVS from this visit for a full list of these instructions which I personally wrote for this pt and  are unique to this visit.     Christinia Gully, MD 07/04/2018

## 2018-07-04 NOTE — Patient Instructions (Signed)
Pantoprazole (protonix) 40 mg   Take  30-60 min before first meal of the day and Pepcid (famotidine)  20 mg x one hour  before  bedtime until return to office - this is the best way to tell whether stomach acid is contributing to your problem.    GERD (REFLUX)  is an extremely common cause of respiratory symptoms just like yours , many times with no obvious heartburn at all.    It can be treated with medication, but also with lifestyle changes including elevation of the head of your bed (ideally with 6 inch  bed blocks),  Smoking cessation, avoidance of late meals, excessive alcohol, and avoid fatty foods, chocolate, peppermint, colas, red wine, and acidic juices such as orange juice.  NO MINT OR MENTHOL PRODUCTS SO NO COUGH DROPS   USE SUGARLESS CANDY INSTEAD (Jolley ranchers or Stover's or Life Savers) or even ice chips will also do - the key is to swallow to prevent all throat clearing. NO OIL BASED VITAMINS - use powdered substitutes.    Please see patient coordinator before you leave today  to schedule sinus CT    Please remember to go to the lab department downstairs in the basement  for your tests - we will call you with the results when they are available.      Please schedule a follow up office visit in 4 weeks, sooner if needed  with all medications /inhalers/ solutions in hand so we can verify exactly what you are taking. This includes all medications from all doctors and over the counters

## 2018-07-05 NOTE — Assessment & Plan Note (Addendum)
Echo 02/26/18  - Left ventricle: The cavity size was normal. Wall thickness was   increased in a pattern of mild LVH. Systolic function was normal.   The estimated ejection fraction was 50%. Hypokinesis of the   basalinferoseptal myocardium. Features are consistent with a   pseudonormal left ventricular filling pattern, with concomitant   abnormal relaxation and increased filling pressure (grade 2   diastolic dysfunction). - Aortic valve: Transvalvular velocity was within the normal range.   There was no stenosis. There was no regurgitation. - Mitral valve: There was trivial regurgitation. Valve area by   continuity equation (using LVOT flow): 2.11 cm^2. - Left atrium: The atrium was severely dilated. - Right ventricle: Systolic function was normal. - Right atrium: The atrium was moderately dilated. - Tricuspid valve: There was trivial regurgitation. - Pulmonic valve: There was no significant regurgitation. Spirometry 07/04/2018  FEV1 2.40 (117%)  Ratio 90 s curvature off all rx     Sob in shower but not with extended walking ? Etiology   Symptoms are markedly disproportionate to objective findings and not clear to what extent this is actually a pulmonary  problem but pt does appear to have difficult to sort out respiratory symptoms of unknown origin for which  DDX  = almost all start with A and  include Adherence, Ace Inhibitors, Acid Reflux, Active Sinus Disease, Alpha 1 Antitripsin deficiency, Anxiety masquerading as Airways dz,  ABPA,  Allergy(esp in young), Aspiration (esp in elderly), Adverse effects of meds,  Active smoking or Vaping, A bunch of PE's/clot burden (a few small clots can't cause this syndrome unless there is already severe underlying pulm or vascular dz with poor reserve),  Anemia or thyroid disorder, plus two Bs  = Bronchiectasis and Beta blocker use..and one C= CHF   Adherence is always the initial "prime suspect" and is a multilayered concern that requires a "trust but  verify" approach in every patient - starting with knowing how to use medications, especially inhalers, correctly, keeping up with refills and understanding the fundamental difference between maintenance and prns vs those medications only taken for a very short course and then stopped and not refilled.  - very easily confused with details of care/ meds - return with all meds in hand using a trust but verify approach to confirm accurate Medication  Reconciliation The principal here is that until we are certain that the  patients are doing what we've asked, it makes no sense to ask them to do more.    ? Acid (or non-acid) GERD > always difficult to exclude as up to 75% of pts in some series report no assoc GI/ Heartburn symptoms> rec max (24h)  acid suppression and diet restrictions/ reviewed and instructions given in writing.    ? Active sinus dz > sinus ct ordered  ? Adverse effects of meds > none of the usual suspects listed   ? Allergy/ asthma > send allergy profile , continue singulair/ zyrtec and prn duoneb but this is not nor ever has been copd based on hex of never smoking and nl pfts today  ? Anxiety > usually at the bottom of this list of usual suspects but should be much higher on this pt's based on H and P and note already on psychotropics and may interfere with adherence and also interpretation of response or lack thereof to symptom management which can be quite subjective.   ? BB effects :  On moderately high doses of coreg, so if starts wheezing  rather than escalate bronchodilators : In the setting of respiratory symptoms of unknown etiology,  It would be preferable to use bystolic, the most beta -1  selective Beta blocker available in sample form, with bisoprolol the most selective generic choice  on the market, at least on a trial basis, to make sure the spillover Beta 2 effects of the less specific Beta blockers are not contributing to this patient's symptoms.    ? Chf/ cardiac  asthma would be the likely culprit here based on cxr review.  rx per PCP/ cards. Presently not vol overloaded.  See echo 02/26/18 reviewed with pt

## 2018-07-05 NOTE — Assessment & Plan Note (Signed)
Allergy profile 07/04/2018 >  Eos 0. /  IgE  Ordered  - sinus CT pending    Upper airway cough syndrome (previously labeled PNDS),  is so named because it's frequently impossible to sort out how much is  CR/sinusitis with freq throat clearing (which can be related to primary GERD)   vs  causing  secondary (" extra esophageal")  GERD from wide swings in gastric pressure that occur with throat clearing, often  promoting self use of mint and menthol lozenges that reduce the lower esophageal sphincter tone and exacerbate the problem further in a cyclical fashion.   These are the same pts (now being labeled as having "irritable larynx syndrome" by some cough centers) who not infrequently have a history of having failed to tolerate ace inhibitors,  dry powder inhalers or biphosphonates or report having atypical/extraesophageal reflux symptoms that don't respond to standard doses of PPI  and are easily confused as having aecopd or asthma flares by even experienced allergists/ pulmonologists (myself included).    rec check sinus ct / hold abx for now and rx max for gerd then return in 4 weeks with all meds in hand using a trust but verify approach to confirm accurate Medication  Reconciliation The principal here is that until we are certain that the  patients are doing what we've asked, it makes no sense to ask them to do more.        Total time devoted to counseling  > 50 % of initial 60 min office visit:  review case with pt/fiance discussion of options/alternatives/ personally creating written customized instructions  in presence of pt  then going over those specific  Instructions directly with the pt including how to use all of the meds but in particular covering each new medication in detail and the difference between the maintenance= "automatic" meds and the prns using an action plan format for the latter (If this problem/symptom => do that organization reading Left to right).  Please see AVS from this  visit for a full list of these instructions which I personally wrote for this pt and  are unique to this visit.     Marland Kitchen

## 2018-07-07 ENCOUNTER — Ambulatory Visit (INDEPENDENT_AMBULATORY_CARE_PROVIDER_SITE_OTHER): Payer: Medicaid Other | Admitting: Cardiovascular Disease

## 2018-07-07 ENCOUNTER — Encounter: Payer: Self-pay | Admitting: Cardiovascular Disease

## 2018-07-07 VITALS — BP 112/64 | HR 69 | Ht 64.5 in | Wt 194.8 lb

## 2018-07-07 DIAGNOSIS — I10 Essential (primary) hypertension: Secondary | ICD-10-CM

## 2018-07-07 DIAGNOSIS — I25118 Atherosclerotic heart disease of native coronary artery with other forms of angina pectoris: Secondary | ICD-10-CM | POA: Diagnosis not present

## 2018-07-07 DIAGNOSIS — I5032 Chronic diastolic (congestive) heart failure: Secondary | ICD-10-CM

## 2018-07-07 DIAGNOSIS — D509 Iron deficiency anemia, unspecified: Secondary | ICD-10-CM

## 2018-07-07 DIAGNOSIS — N184 Chronic kidney disease, stage 4 (severe): Secondary | ICD-10-CM | POA: Diagnosis not present

## 2018-07-07 NOTE — Progress Notes (Signed)
Cardiology Office Note:    Date:  07/08/2018   ID:  Brenda Sims, DOB 01-17-1956, MRN [id number]  PCP:  Charlott Rakes, MD  Cardiologist:  Sanda Klein, MD   Referring MD: Charlott Rakes, MD   Chief Complaint  Patient presents with  . Follow-up    CAD, left ventricular dysfunction    History of Present Illness:    Brenda Sims is a 62 y.o. female with a hx of coronary artery disease who presented with pulseless electrical activity arrest in the setting of severe hypoxemia, depressed left ventricular systolic function and regional wall motion abnormalities in January 2019.  Brief hospitalization for arm pain in February and had severe hypokalemia at the time.  Repeat hospitalization in March for heart failure.  She had multivessel coronary artery disease with moderate lesions and was advised medical therapy.  LVEF was 35-45% by angiography but improved to 50% by echocardiogram performed in March.    Discharge weight after the January admission was 181-1/2 pounds; her weight at March discharge was 176-1/2 pounds.  Her weight was 185 pounds on our office scale at her last office visit in July.  She weighed 191 pounds in September.  Today she weighs almost 195 months.  She was in the emergency room last Friday for complaints of shortness of breath.  Retrospectively she thinks she was just very upset and anxious.  Her work-up included a chest x-ray that did not show pulmonary edema or pneumonia, but very low BNP level, normal cardiac enzymes.  She was subsequently seen in the pulmonary clinic by Dr. Melvyn Novas.  Pulmonary function tests were essentially normal.  There was no evidence of COPD.  She always smells of tobacco because her fianc is a chain smoker.  She herself does not smoke.  She was very upset with her treatment in the emergency room.  She states that she was initially in a room, but then was taken out into the hallway and somebody else's place in her room.  She did not receive any  food for almost 24 hours.  She does not have any shortness of breath at this time and generally feels better.  She is very upset about her social situation.  She would like to move out in the place of her own but cannot afford to.  She has no transportation and has to rely on the fianc, who does not want to leave the home, refuses to take her to church or shopping.  The patient specifically denies any chest pain at rest exertion, dyspnea at rest or with exertion since she is quite sedentary), orthopnea, paroxysmal nocturnal dyspnea, syncope, palpitations, focal neurological deficits, intermittent claudication, lower extremity edema, unexplained weight gain, cough, hemoptysis.    Treatment with ARB was initiated in April by Dr. Arelia Sneddon, but labs performed in June showed increase in creatinine to 2.12, from a previous baseline of approximately 1.4.  Creatinine was even worse at 3.03 at her ER visit last week.  She has well-controlled diabetes mellitus with the most recent hemoglobin A1c of 6.4% in June and 6.9% last week.  Lipid profile performed in March showed an LDL cholesterol level of 81, HDL 34.  Most recent hemoglobin has improved 10.6, still with microcytic indices, but not quite as pronounced, now MCV around 79.  Hemoglobin has oscillated 7.4-10.5 over the last 12 months.  She received a transfusion in January.  Labs confirmed iron deficiency.  Reportedly has had Hemoccult positive stools at an outside facility.  Past  Medical History:  Diagnosis Date  . Anemia   . Anxiety   . CAD (coronary artery disease)   . CHF (congestive heart failure) ([place])   . CKD (chronic kidney disease)   . COPD (chronic obstructive pulmonary disease) ([place])   . Depression   . Diabetes mellitus without complication ([place])   . Hypertension   . Hypothyroid   . MI (myocardial infarction) ([place])   . Sleep apnea    no cpap , "never gave me one"    Past Surgical History:  Procedure Laterality Date  . APPENDECTOMY      . BACK SURGERY    . LEFT HEART CATH AND CORONARY ANGIOGRAPHY N/A 09/11/2017   Procedure: LEFT HEART CATH AND CORONARY ANGIOGRAPHY;  Surgeon: Martinique, Peter M, MD;  Location: [facility];  Service: Cardiovascular;  Laterality: N/A;  . TOTAL KNEE ARTHROPLASTY Right 2007    Current Medications: Current Meds  Medication Sig  . ACCU-CHEK SOFTCLIX LANCETS lancets Use as instructed  . amLODipine (NORVASC) 10 MG tablet Take 1 tablet (10 mg total) by mouth daily. MUST MAKE APPT FOR FURTHER REFILLS (Patient taking differently: Take 10 mg by mouth daily. )  . aspirin EC 81 MG tablet Take 1 tablet (81 mg total) by mouth daily.  . Blood Glucose Monitoring Suppl (ACCU-CHEK AVIVA) device Use as instructed  . Blood Glucose Monitoring Suppl (TRUE METRIX METER) DEVI 1 kit by Does not apply route 4 (four) times daily.  . busPIRone (BUSPAR) 5 MG tablet Take 1 tablet (5 mg total) by mouth 3 (three) times daily.  . carvedilol (COREG) 25 MG tablet TAKE 1 TABLET BY MOUTH 2 (TWO) TIMES DAILY WITH A MEAL. (Patient taking differently: Take 25 mg by mouth 2 (two) times daily with a meal. )  . cetirizine (ZYRTEC) 10 MG tablet TAKE 1 TABLET (10 MG TOTAL) BY MOUTH DAILY.  . clotrimazole-betamethasone (LOTRISONE) cream Apply 1 application topically See admin instructions. Apply to affected areas of scalp daily as directed  . cyclobenzaprine (FLEXERIL) 10 MG tablet Take 1 tablet (10 mg total) by mouth 3 (three) times daily as needed for muscle spasms.  . famotidine (PEPCID) 20 MG tablet One at bedtime  . FLUoxetine (PROZAC) 20 MG capsule Take 20 mg by mouth daily.  . furosemide (LASIX) 40 MG tablet Take 2 tablets (80 mg total) by mouth every morning AND 1 tablet (40 mg total) every evening.  . gabapentin (NEURONTIN) 300 MG capsule Take 1 capsule (300 mg total) by mouth 3 (three) times daily.  Marland Kitchen glucose blood (ACCU-CHEK AVIVA) test strip Use as instructed  . glucose blood (TRUE METRIX BLOOD GLUCOSE TEST) test strip Use  as instructed  . guaiFENesin-dextromethorphan (ROBITUSSIN DM) 100-10 MG/5ML syrup Take 5 mLs by mouth every 4 (four) hours as needed for cough.  . hydrALAZINE (APRESOLINE) 100 MG tablet Take 1 tablet (100 mg total) by mouth 3 (three) times daily.  Marland Kitchen Hyoscyamine Sulfate SL (LEVSIN/SL) 0.125 MG SUBL Place 1 tablet under the tongue every 4 (four) hours as needed. (Patient taking differently: Place 1 tablet under the tongue every 4 (four) hours as needed (for abdominal cramping). )  . insulin aspart (NOVOLOG) 100 UNIT/ML injection Inject 4 Units into the skin 3 (three) times daily with meals. (Patient taking differently: Inject 4 Units into the skin 3 (three) times daily after meals. )  . Insulin Glargine (LANTUS SOLOSTAR) 100 UNIT/ML Solostar Pen Inject 20 Units into the skin 2 (two) times daily. (Patient taking differently: Inject  20 Units into the skin 3 (three) times daily. )  . Insulin Pen Needle (TRUEPLUS PEN NEEDLES) 31G X 6 MM MISC Use as directed  . ipratropium-albuterol (DUONEB) 0.5-2.5 (3) MG/3ML SOLN Take 3 mLs by nebulization every 6 (six) hours as needed.  Marland Kitchen ketoconazole (NIZORAL) 2 % shampoo Daily for the next week then twice a week until resolution  . losartan (COZAAR) 100 MG tablet Take 100 mg by mouth daily.  . montelukast (SINGULAIR) 10 MG tablet Take 1 tablet (10 mg total) by mouth at bedtime.  . pantoprazole (PROTONIX) 40 MG tablet Take 1 tablet (40 mg total) by mouth daily. Take 30-60 min before first meal of the day  . potassium chloride SA (K-DUR,KLOR-CON) 10 MEQ tablet Take 1 tablet (10 mEq total) by mouth daily.  Marland Kitchen spironolactone (ALDACTONE) 50 MG tablet Take 1 tablet (50 mg total) by mouth daily.  . TRUEPLUS LANCETS 28G MISC 28 g by Does not apply route 4 (four) times daily.     Allergies:   Eggs or egg-derived products; Azithromycin; Cherry; and Latex   Social History   Socioeconomic History  . Marital status: Divorced    Spouse name: Not on file  . Number of children:  0  . Years of education: Not on file  . Highest education level: Not on file  Occupational History  . Occupation: retired  Scientific laboratory technician  . Financial resource strain: Not on file  . Food insecurity:    Worry: Not on file    Inability: Not on file  . Transportation needs:    Medical: Not on file    Non-medical: Not on file  Tobacco Use  . Smoking status: Never Smoker  . Smokeless tobacco: Never Used  Substance and Sexual Activity  . Alcohol use: No    Frequency: Never  . Drug use: No  . Sexual activity: Not on file  Lifestyle  . Physical activity:    Days per week: Not on file    Minutes per session: Not on file  . Stress: Not on file  Relationships  . Social connections:    Talks on phone: Not on file    Gets together: Not on file    Attends religious service: Not on file    Active member of club or organization: Not on file    Attends meetings of clubs or organizations: Not on file    Relationship status: Not on file  Other Topics Concern  . Not on file  Social History Narrative  . Not on file     Family History: The patient's family history includes Hypertension in her mother. There is no history of Colon cancer or Esophageal cancer.  ROS:   Please see the history of present illness.    All other systems are reviewed and are negative  EKGs/Labs/Other Studies Reviewed:    The following studies were reviewed today: Cath 09/11/2017 Conclusion     Ost Ramus to Ramus lesion is 70% stenosed.  Prox Cx to Mid Cx lesion is 65% stenosed.  Mid RCA to Dist RCA lesion is 50% stenosed.  LV end diastolic pressure is mildly elevated.  There is moderate left ventricular systolic dysfunction.  The left ventricular ejection fraction is 35-45% by visual estimate.  1. Moderate nonobstructive CAD 2. Moderate LV dysfunction. 3. Mildly elevated LVEDP   Echo 10/20/17:  Study Conclusions - Left ventricle: The cavity size was mildly dilated. Wall thickness was  increased in a pattern of mild LVH. Basal inferior  akinesis. The estimated ejection fraction was 50%. Features are consistent with a pseudonormal left ventricular filling pattern, with concomitant abnormal relaxation and increased filling pressure (grade 2 diastolic dysfunction). - Aortic valve: There was no stenosis. - Mitral valve: Mildly calcified annulus. Mildly calcified leaflets . There was mild to moderate regurgitation. - Left atrium: The atrium was severely dilated. - Right ventricle: The cavity size was normal. Systolic function was normal. - Tricuspid valve: Peak RV-RA gradient (S): 25 mm Hg. - Pulmonary arteries: PA peak pressure: 33 mm Hg (S). - Systemic veins: IVC measured 2.4 cm with > 50% respirophasic variation, suggesting RA pressure 8 mmHg.  Impressions:  - Mildly dilated LV with mild LV hypertrophy. EF 50% with basal inferior akinesis. Moderate diastolic dysfunction. Normal RV size and systolic function. Severe left atrial enlargement. Mild to moderate MR.    EKG:  EKG is not ordered today.  ECG 06/27/2018 shows normal sinus rhythm, left atrial abnormality, left anterior fascicular block, poor R wave progression, T wave inversion in leads I, V5 and V6, QTC 462 ms. Recent Labs: 09/03/2017: TSH 2.308 10/19/2017: Magnesium 1.5 06/27/2018: ALT 14; B Natriuretic Peptide 39.0; Hemoglobin 10.6; Platelets 245 06/28/2018: BUN 51; Creatinine, Ser 3.03; Potassium 4.3; Sodium 142  Recent Lipid Panel    Component Value Date/Time   CHOL 229 (H) 03/06/2018 0547   TRIG 218 (H) 03/06/2018 0547   HDL 29 (L) 03/06/2018 0547   CHOLHDL 7.9 03/06/2018 0547   VLDL 44 (H) 03/06/2018 0547   LDLCALC 156 (H) 03/06/2018 0547    Physical Exam:    VS:  BP 112/64   Pulse 69   Ht 5' 4.5" (1.638 m)   Wt 194 lb 12.8 oz (88.4 kg)   BMI 32.92 kg/m     Wt Readings from Last 3 Encounters:  07/07/18 194 lb 12.8 oz (88.4 kg)  07/04/18 193 lb (87.5 kg)  06/28/18  192 lb 1.6 oz (87.1 kg)     General: Alert, oriented x3, no distress, mildly obese Head: no evidence of trauma, PERRL, EOMI, no exophtalmos or lid lag, no myxedema, no xanthelasma; normal ears, nose and oropharynx Neck: normal jugular venous pulsations and no hepatojugular reflux; brisk carotid pulses without delay and no carotid bruits Chest: clear to auscultation, no signs of consolidation by percussion or palpation, normal fremitus, symmetrical and full respiratory excursions Cardiovascular: normal position and quality of the apical impulse, regular rhythm, normal first and second heart sounds, no murmurs, rubs or gallops Abdomen: no tenderness or distention, no masses by palpation, no abnormal pulsatility or arterial bruits, normal bowel sounds, no hepatosplenomegaly Extremities: no clubbing, cyanosis or edema; 2+ radial, ulnar and brachial pulses bilaterally; 2+ right femoral, posterior tibial and dorsalis pedis pulses; 2+ left femoral, posterior tibial and dorsalis pedis pulses; no subclavian or femoral bruits Neurological: grossly nonfocal Psych: Normal mood and affect   ASSESSMENT:    1. Coronary artery disease of native artery of native heart with stable angina pectoris ([place])   2. Microcytic hypochromic anemia   3. Chronic diastolic heart failure ([place])   4. CKD (chronic kidney disease) stage 4, GFR 15-29 ml/min ([facility])   5. Essential hypertension    PLAN:    In order of problems listed above:  1. CAD: Remains asymptomatic.  Does not have chest pain.  Does not have angina pectoris while on treatment with beta-blockers and amlodipine.  Has widespread coronary stenoses, none of which are clearly critical or require revascularization especially in the absence of angina.  She still has microcytic anemia and recent concern for continued blood loss, which would be an issue if we have to perform percutaneous revascularization future.  Nevertheless her anemia is improving. 2. CHF: On initial  presentation had a moderately depressed LVEF, but this has steadily improved and is just slightly less than normal by her most recent echocardiogram in March and again in July (EF 50%).  On the July echo was performed she had evidence of volume overload, but when she was seen in the emergency room last week her BNP was very low.  She does not appear to be clinically volume overloaded today.  She is gaining a lot of weight, about 20 pounds since last March.  Her creatinine is worse than it was in the past and I do not think her diuretics should be increased.  She is quite sedentary.  She reports she would like to walk more [place] safe place for her to walk.  NYHA functional class II.  PFTs performed recently did not confirm the COPD diagnosis. 3. CKD 4: As far as I can tell her baseline creatinine is around 1.4 (GFR 45), before she was started on ARB.  After losartan was started her creatinine has been 2.1-3.0, corresponding to a GFR of 20-30 mL/min.  She is also receiving spironolactone.  Her potassium is normal.  I think it is very important that she establish follow-up with a nephrologist. 4. HTN: Controlled.  5. Anemia: Clearly there is a component of iron deficiency but probably also a component of chronic disease/erythropoietin deficiency due to her kidney failure.  I do not think she ever had her GI work-up.  Her iron deficiency is probably improving as marked by the increased hemoglobin and MCV values.   Medication Adjustments/Labs and Tests Ordered: Current medicines are reviewed at length with the patient today.  Concerns regarding medicines are outlined above.  No orders of the defined types were placed in this encounter.  No orders of the defined types were placed in this encounter.   Patient Instructions  Medication Instructions:  Dr Sallyanne Kuster recommends that you continue on your current medications as directed. Please refer to the Current  Medication list given to you today.  If you need a refill on your cardiac medications before your next appointment, please call your pharmacy.   Follow-Up: At [facility], you and your health needs are our priority.  As part of our continuing mission to provide you with exceptional heart care, we have created designated Provider Care Teams.  These Care Teams include your primary Cardiologist (physician) and Advanced Practice Providers (APPs -  Physician Assistants and Nurse Practitioners) who all work together to provide you with the care you need, when you need it. You will need a follow up appointment in 6 months.  Please call our office 2 months in advance to schedule this appointment.  You may see Sanda Klein, MD or one of the following Advanced Practice Providers on your designated Care Team: [place], [place] . Fabian Sharp, PA-C    Signed, Sanda Klein, MD  07/08/2018 1:22 PM    [facility]

## 2018-07-07 NOTE — Patient Instructions (Signed)
Medication Instructions:  Dr Croitoru recommends that you continue on your current medications as directed. Please refer to the Current Medication list given to you today.  If you need a refill on your cardiac medications before your next appointment, please call your pharmacy.   Follow-Up: At [facility], you and your health needs are our priority.  As part of our continuing mission to provide you with exceptional heart care, we have created designated Provider Care Teams.  These Care Teams include your primary Cardiologist (physician) and Advanced Practice Providers (APPs -  Physician Assistants and Nurse Practitioners) who all work together to provide you with the care you need, when you need it. You will need a follow up appointment in 6 months.  Please call our office 2 months in advance to schedule this appointment.  You may see Mihai Croitoru, MD or one of the following Advanced Practice Providers on your designated Care Team: Hao Meng, PA-C . Hailey Miles Duke, PA-C 

## 2018-07-08 ENCOUNTER — Inpatient Hospital Stay: Admission: RE | Admit: 2018-07-08 | Payer: Medicaid Other | Source: Ambulatory Visit

## 2018-07-16 ENCOUNTER — Other Ambulatory Visit: Payer: Self-pay

## 2018-07-16 DIAGNOSIS — D509 Iron deficiency anemia, unspecified: Secondary | ICD-10-CM

## 2018-07-17 ENCOUNTER — Telehealth: Payer: Self-pay | Admitting: *Deleted

## 2018-07-17 NOTE — Telephone Encounter (Signed)
LMTCB

## 2018-07-17 NOTE — Telephone Encounter (Signed)
-----   Message from Tanda Rockers, MD sent at 07/16/2018  5:19 PM EST ----- Let her know her insurance won't cover a ct sinus so if still having problems with cough  needs to first see ENT - [facility] rec

## 2018-07-18 NOTE — Telephone Encounter (Signed)
LMTCB

## 2018-07-19 IMAGING — CT CT ANGIO CHEST
3 of 7 series · 18 of 36 positions shown · IV contrast (Omni 300)
Comparison: Radiograph 10/07/2017, CT chest 09/01/2016

CLINICAL DATA: Shortness of breath

EXAM:
CT ANGIOGRAPHY CHEST WITH CONTRAST
TECHNIQUE: Multidetector CT imaging of the chest was performed using the
standard protocol during bolus administration of intravenous
contrast. Multiplanar CT image reconstructions and MIPs were
obtained to evaluate the vascular anatomy.
CONTRAST:  73 ml [id number] IOPAMIDOL ([id number]) INJECTION 76%

[Series 8: pe lung · axial · 0.70mm/px · z∈[+1402,+1534]mm · 4 of 110 slices shown]
[im 22/110  mediastinal]
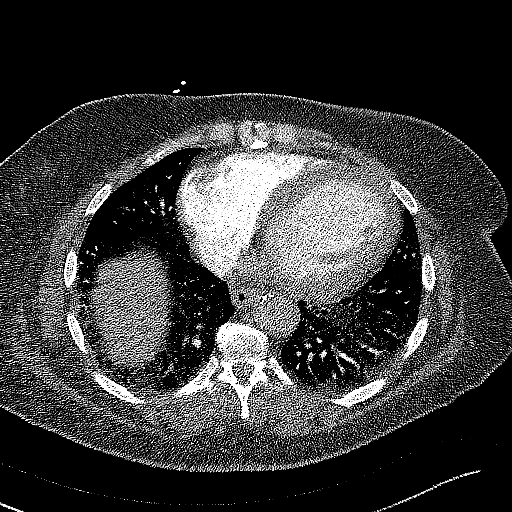
[im 44/110  mediastinal]
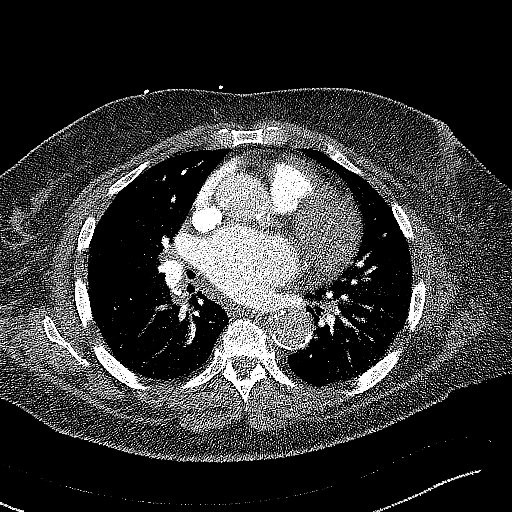
[im 66/110  mediastinal]
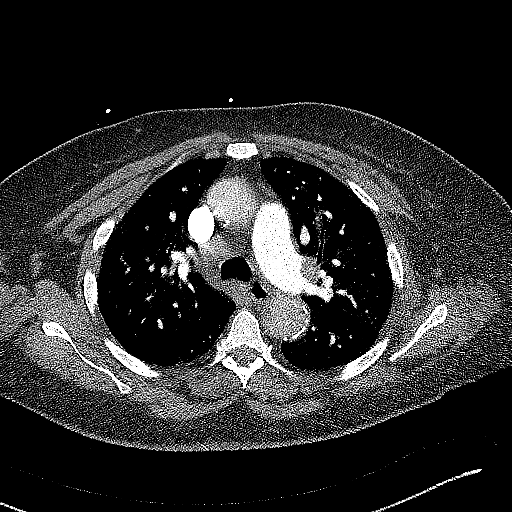
[im 88/110  mediastinal]
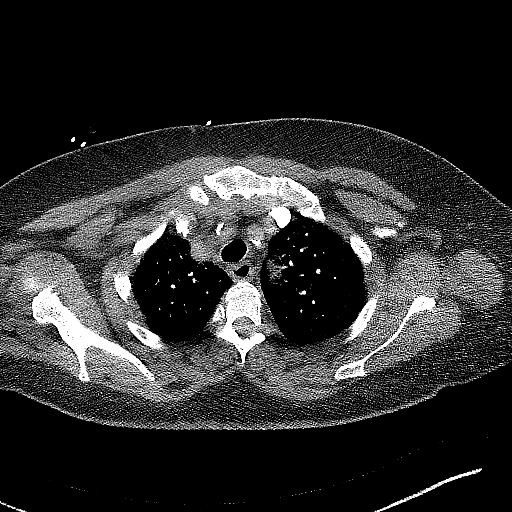

[Series 9: pe thins · axial · 0.70mm/px · z∈[+1332,+1558]mm · 13 of 265 slices shown]
[im 19/265  lung]
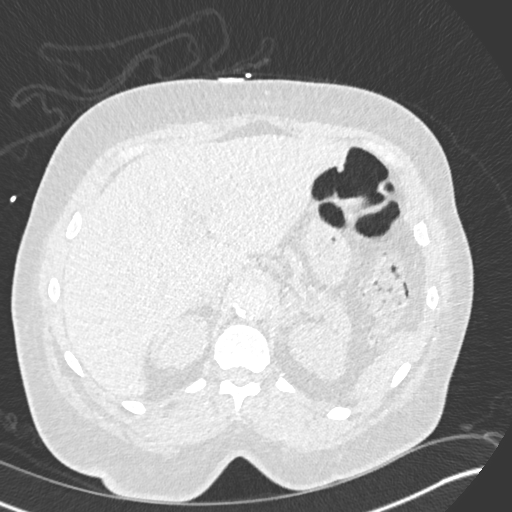
[im 38/265  mediastinal]
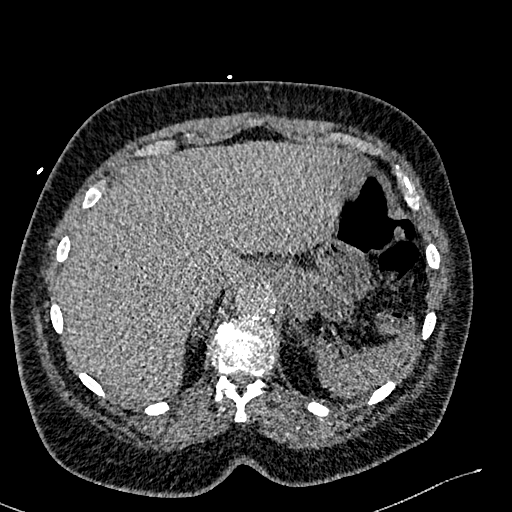
[im 57/265  lung]
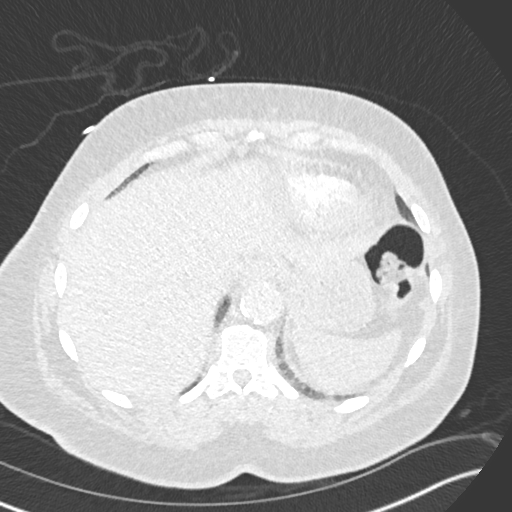
[im 76/265  mediastinal]
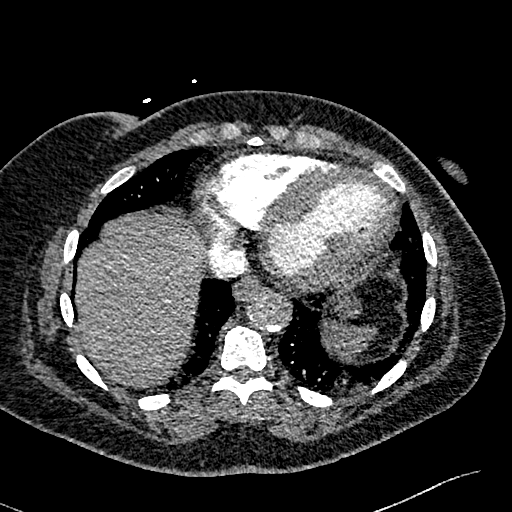
[im 95/265  lung]
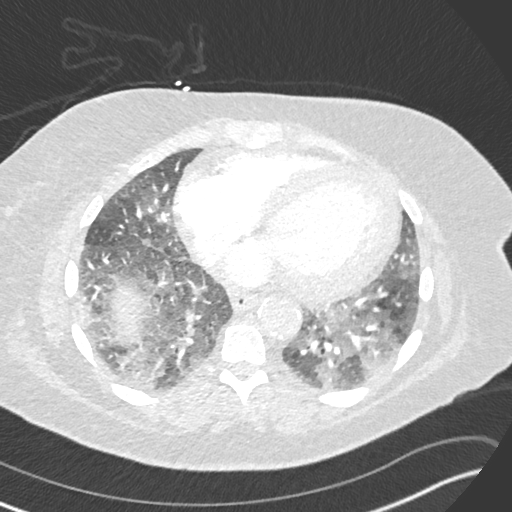
[im 114/265  mediastinal]
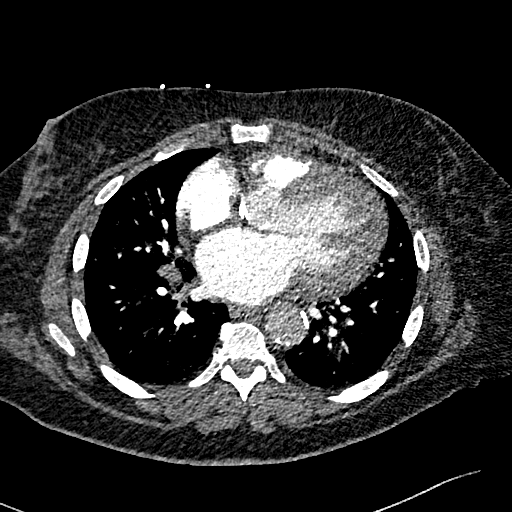
[im 133/265  lung]
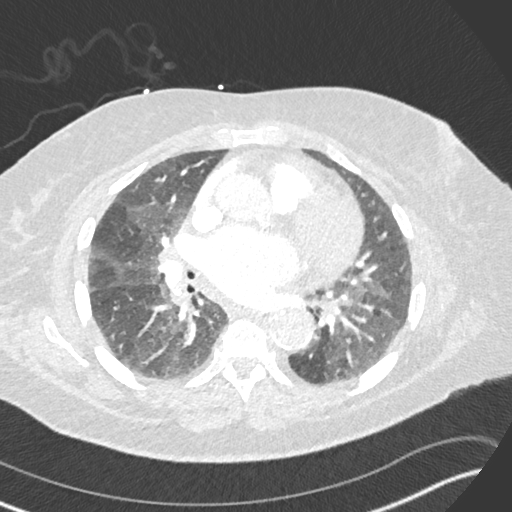
[im 151/265  mediastinal]
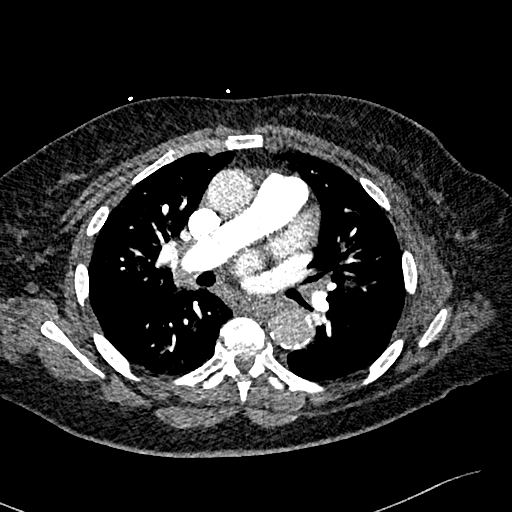
[im 170/265  lung]
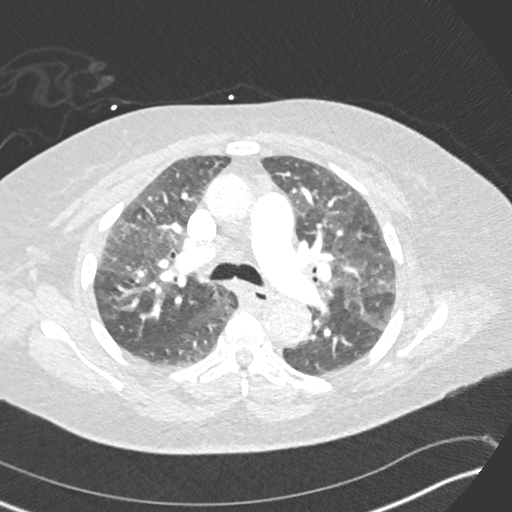
[im 189/265  mediastinal]
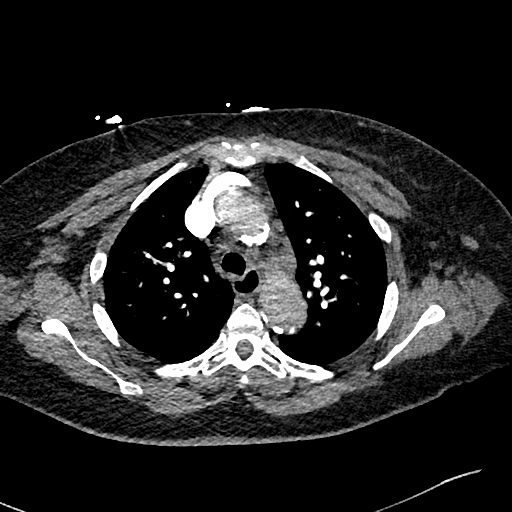
[im 208/265  lung]
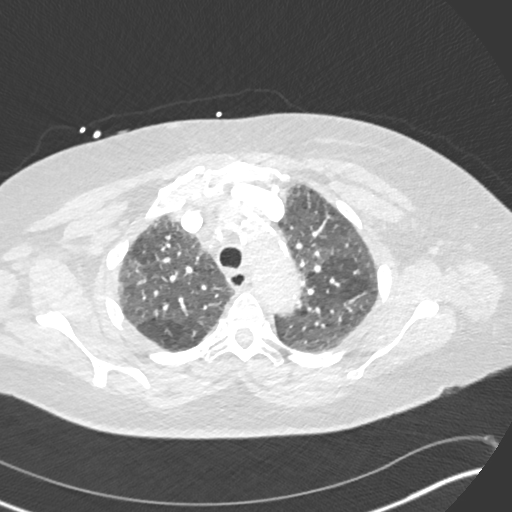
[im 227/265  mediastinal]
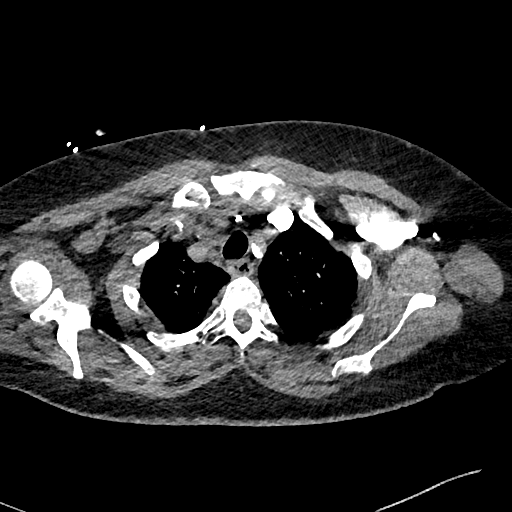
[im 246/265  lung]
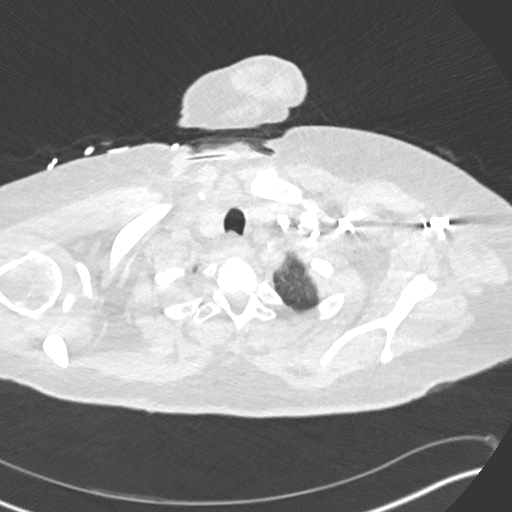

[Series 10: pe 2mm cor · coronal · 0.52mm/px · 1 of 151 slices shown]
[im 76/151  mediastinal]
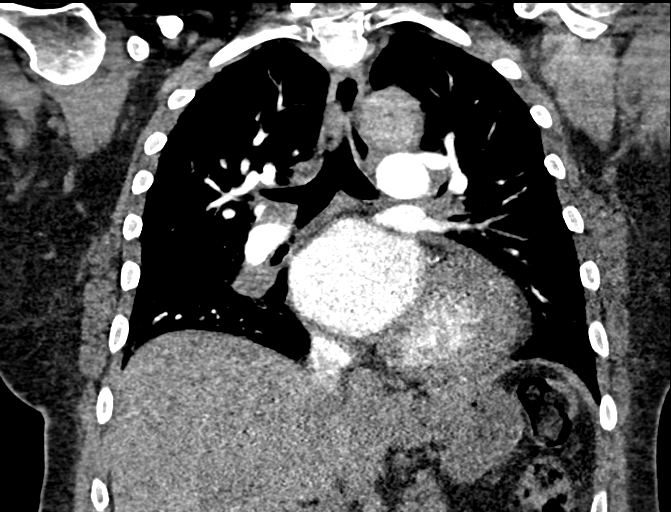

[18 of 36 positions shown; findings below may reference images not displayed]

FINDINGS: Cardiovascular: Satisfactory opacification of the pulmonary arteries
to the segmental level. No evidence of pulmonary embolism.
Nonaneurysmal aorta. Moderate-to-marked aortic atherosclerosis.
Coronary artery calcification. Cardiomegaly. No large pericardial
effusion

Mediastinum/Nodes: Midline trachea. Hypodensity at the right lobe of
thyroid thought artifactual given normal appearance on recent
comparison. Surgical clips at the left lobe. Prominent right
paratracheal lymph node measuring up to 15 mm.

Lungs/Pleura: Decreased ground-glass density. Mild residual
ground-glass opacity within the bilateral lungs. No pleural
effusion. No pneumothorax.

Upper Abdomen: No acute abnormality. Nodular fullness of the left
adrenal gland.

Musculoskeletal: No chest wall abnormality. No acute or significant
osseous findings.

Review of the MIP images confirms the above findings.
IMPRESSION: 1. Negative for acute pulmonary embolus
2. Overall decreased ground-glass density within the lung fields.
There is residual mild mosaic/ground-glass density throughout the
lung suspected to represent residual pulmonary edema. There is
cardiomegaly.

Aortic Atherosclerosis ([id number]-K59.9).

## 2018-07-21 NOTE — Telephone Encounter (Signed)
LMTCB and will close per protocol  

## 2018-08-01 ENCOUNTER — Ambulatory Visit: Payer: Medicaid Other | Admitting: Internal Medicine

## 2018-08-11 ENCOUNTER — Other Ambulatory Visit: Payer: Self-pay | Admitting: Nurse Practitioner

## 2018-08-11 ENCOUNTER — Other Ambulatory Visit: Payer: Self-pay | Admitting: Family Medicine

## 2018-08-11 ENCOUNTER — Ambulatory Visit
Admission: RE | Admit: 2018-08-11 | Discharge: 2018-08-11 | Disposition: A | Discharge status: Home / Self Care | Payer: Medicaid Other | Source: Ambulatory Visit | Attending: Family Medicine | Admitting: Family Medicine

## 2018-08-11 DIAGNOSIS — R05 Cough: Secondary | ICD-10-CM

## 2018-08-11 DIAGNOSIS — R059 Cough, unspecified: Secondary | ICD-10-CM

## 2018-08-11 DIAGNOSIS — R0989 Other specified symptoms and signs involving the circulatory and respiratory systems: Secondary | ICD-10-CM

## 2018-08-12 ENCOUNTER — Inpatient Hospital Stay (HOSPITAL_COMMUNITY)
Admission: EM | Admit: 2018-08-12 | Discharge: 2018-08-13 | DRG: 194 | Discharge status: Against Medical Advice | Payer: Medicaid Other | Attending: Internal Medicine | Admitting: Internal Medicine

## 2018-08-12 ENCOUNTER — Emergency Department (HOSPITAL_COMMUNITY): Payer: Medicaid Other

## 2018-08-12 ENCOUNTER — Encounter (HOSPITAL_COMMUNITY): Payer: Self-pay | Admitting: *Deleted

## 2018-08-12 ENCOUNTER — Other Ambulatory Visit: Payer: Self-pay

## 2018-08-12 DIAGNOSIS — E1122 Type 2 diabetes mellitus with diabetic chronic kidney disease: Secondary | ICD-10-CM | POA: Diagnosis present

## 2018-08-12 DIAGNOSIS — D509 Iron deficiency anemia, unspecified: Secondary | ICD-10-CM | POA: Diagnosis present

## 2018-08-12 DIAGNOSIS — N179 Acute kidney failure, unspecified: Secondary | ICD-10-CM | POA: Diagnosis present

## 2018-08-12 DIAGNOSIS — Z881 Allergy status to other antibiotic agents status: Secondary | ICD-10-CM

## 2018-08-12 DIAGNOSIS — I509 Heart failure, unspecified: Secondary | ICD-10-CM

## 2018-08-12 DIAGNOSIS — I130 Hypertensive heart and chronic kidney disease with heart failure and stage 1 through stage 4 chronic kidney disease, or unspecified chronic kidney disease: Secondary | ICD-10-CM | POA: Diagnosis present

## 2018-08-12 DIAGNOSIS — J9801 Acute bronchospasm: Secondary | ICD-10-CM | POA: Diagnosis present

## 2018-08-12 DIAGNOSIS — F329 Major depressive disorder, single episode, unspecified: Secondary | ICD-10-CM | POA: Diagnosis present

## 2018-08-12 DIAGNOSIS — Z7982 Long term (current) use of aspirin: Secondary | ICD-10-CM

## 2018-08-12 DIAGNOSIS — I2510 Atherosclerotic heart disease of native coronary artery without angina pectoris: Secondary | ICD-10-CM | POA: Diagnosis present

## 2018-08-12 DIAGNOSIS — E039 Hypothyroidism, unspecified: Secondary | ICD-10-CM | POA: Diagnosis present

## 2018-08-12 DIAGNOSIS — E875 Hyperkalemia: Secondary | ICD-10-CM | POA: Diagnosis present

## 2018-08-12 DIAGNOSIS — F419 Anxiety disorder, unspecified: Secondary | ICD-10-CM | POA: Diagnosis present

## 2018-08-12 DIAGNOSIS — J440 Chronic obstructive pulmonary disease with acute lower respiratory infection: Secondary | ICD-10-CM | POA: Diagnosis present

## 2018-08-12 DIAGNOSIS — Z91040 Latex allergy status: Secondary | ICD-10-CM

## 2018-08-12 DIAGNOSIS — N183 Chronic kidney disease, stage 3 (moderate): Secondary | ICD-10-CM | POA: Diagnosis present

## 2018-08-12 DIAGNOSIS — Z794 Long term (current) use of insulin: Secondary | ICD-10-CM

## 2018-08-12 DIAGNOSIS — G894 Chronic pain syndrome: Secondary | ICD-10-CM | POA: Diagnosis present

## 2018-08-12 DIAGNOSIS — Z79899 Other long term (current) drug therapy: Secondary | ICD-10-CM

## 2018-08-12 DIAGNOSIS — I252 Old myocardial infarction: Secondary | ICD-10-CM

## 2018-08-12 DIAGNOSIS — Z96651 Presence of right artificial knee joint: Secondary | ICD-10-CM | POA: Diagnosis present

## 2018-08-12 DIAGNOSIS — I5042 Chronic combined systolic (congestive) and diastolic (congestive) heart failure: Secondary | ICD-10-CM | POA: Diagnosis present

## 2018-08-12 DIAGNOSIS — G4730 Sleep apnea, unspecified: Secondary | ICD-10-CM | POA: Diagnosis present

## 2018-08-12 DIAGNOSIS — E785 Hyperlipidemia, unspecified: Secondary | ICD-10-CM | POA: Diagnosis present

## 2018-08-12 DIAGNOSIS — J189 Pneumonia, unspecified organism: Principal | ICD-10-CM | POA: Diagnosis present

## 2018-08-12 LAB — BASIC METABOLIC PANEL
Anion gap: 12 (ref 5–15)
BUN: 70 mg/dL — ABNORMAL HIGH (ref 8–23)
CALCIUM: 9.9 mg/dL (ref 8.9–10.3)
CO2: 22 mmol/L (ref 22–32)
Chloride: 102 mmol/L (ref 98–111)
Creatinine, Ser: 3.47 mg/dL — ABNORMAL HIGH (ref 0.44–1.00)
GFR calc Af Amer: 16 mL/min — ABNORMAL LOW (ref >60)
GFR calc non Af Amer: 13 mL/min — ABNORMAL LOW (ref >60)
Glucose, Bld: 402 mg/dL — ABNORMAL HIGH (ref 70–99)
Potassium: 5.8 mmol/L — ABNORMAL HIGH (ref 3.5–5.1)
Sodium: 136 mmol/L (ref 135–145)

## 2018-08-12 LAB — I-STAT TROPONIN, ED: Troponin i, poc: 0.01 ng/mL (ref 0.00–0.08)

## 2018-08-12 LAB — RESPIRATORY PANEL BY PCR
Adenovirus: NOT DETECTED
BORDETELLA PERTUSSIS-RVPCR: NOT DETECTED
Chlamydophila pneumoniae: NOT DETECTED
Coronavirus 229E: NOT DETECTED
Coronavirus HKU1: NOT DETECTED
Coronavirus NL63: NOT DETECTED
Coronavirus OC43: NOT DETECTED
INFLUENZA B-RVPPCR: NOT DETECTED
Influenza A: NOT DETECTED
Metapneumovirus: NOT DETECTED
Mycoplasma pneumoniae: NOT DETECTED
Parainfluenza Virus 1: NOT DETECTED
Parainfluenza Virus 2: NOT DETECTED
Parainfluenza Virus 3: NOT DETECTED
Parainfluenza Virus 4: NOT DETECTED
Respiratory Syncytial Virus: NOT DETECTED
Rhinovirus / Enterovirus: NOT DETECTED

## 2018-08-12 LAB — URINALYSIS, ROUTINE W REFLEX MICROSCOPIC
Bilirubin Urine: NEGATIVE
Glucose, UA: NEGATIVE mg/dL
Hgb urine dipstick: NEGATIVE
Ketones, ur: NEGATIVE mg/dL
Leukocytes, UA: NEGATIVE
Nitrite: NEGATIVE
PROTEIN: NEGATIVE mg/dL
Specific Gravity, Urine: 1.01 (ref 1.005–1.030)
pH: 5 (ref 5.0–8.0)

## 2018-08-12 LAB — HEPATIC FUNCTION PANEL
ALT: 16 U/L (ref 0–44)
AST: 19 U/L (ref 15–41)
Albumin: 3.6 g/dL (ref 3.5–5.0)
Alkaline Phosphatase: 62 U/L (ref 38–126)
Bilirubin, Direct: <0.1 mg/dL (ref 0.0–0.2)
Total Bilirubin: 0.4 mg/dL (ref 0.3–1.2)
Total Protein: 7.6 g/dL (ref 6.5–8.1)

## 2018-08-12 LAB — CBC
HCT: 28.9 % — ABNORMAL LOW (ref 36.0–46.0)
Hemoglobin: 8.9 g/dL — ABNORMAL LOW (ref 12.0–15.0)
MCH: 23.7 pg — ABNORMAL LOW (ref 26.0–34.0)
MCHC: 30.8 g/dL (ref 30.0–36.0)
MCV: 77.1 fL — ABNORMAL LOW (ref 80.0–100.0)
PLATELETS: 292 10*3/uL (ref 150–400)
RBC: 3.75 MIL/uL — AB (ref 3.87–5.11)
RDW: 18.6 % — ABNORMAL HIGH (ref 11.5–15.5)
WBC: 27.2 10*3/uL — ABNORMAL HIGH (ref 4.0–10.5)
nRBC: 0 % (ref 0.0–0.2)

## 2018-08-12 LAB — BRAIN NATRIURETIC PEPTIDE: B Natriuretic Peptide: 340.6 pg/mL — ABNORMAL HIGH (ref 0.0–100.0)

## 2018-08-12 LAB — I-STAT CG4 LACTIC ACID, ED: Lactic Acid, Venous: 1.05 mmol/L (ref 0.5–1.9)

## 2018-08-12 MED ORDER — LEVOFLOXACIN 500 MG PO TABS
500.0000 mg | ORAL_TABLET | Freq: Once | ORAL | Status: AC
  Administered 2018-08-12: 500 mg via ORAL
  Filled 2018-08-12: qty 1

## 2018-08-12 MED ORDER — IPRATROPIUM-ALBUTEROL 0.5-2.5 (3) MG/3ML IN SOLN
3.0000 mL | Freq: Once | RESPIRATORY_TRACT | Status: AC
  Administered 2018-08-12: 3 mL via RESPIRATORY_TRACT
  Filled 2018-08-12: qty 3

## 2018-08-12 MED ORDER — SODIUM CHLORIDE 0.9 % IV SOLN
1.0000 g | Freq: Once | INTRAVENOUS | Status: AC
  Administered 2018-08-12: 1 g via INTRAVENOUS
  Filled 2018-08-12: qty 10

## 2018-08-12 MED ORDER — FUROSEMIDE 10 MG/ML IJ SOLN
80.0000 mg | INTRAMUSCULAR | Status: AC
  Administered 2018-08-12: 80 mg via INTRAVENOUS
  Filled 2018-08-12: qty 8

## 2018-08-12 MED ORDER — OSELTAMIVIR PHOSPHATE 75 MG PO CAPS
75.0000 mg | ORAL_CAPSULE | Freq: Once | ORAL | Status: AC
  Administered 2018-08-12: 75 mg via ORAL
  Filled 2018-08-12: qty 1

## 2018-08-12 NOTE — ED Provider Notes (Signed)
[facility] EMERGENCY DEPARTMENT Provider Note   CSN: [id number] Arrival date & time: 08/12/18  1807     History   Chief Complaint Chief Complaint  Patient presents with  . Shortness of Breath    HPI Brenda Sims is a 62 y.o. female.  62yo F w/ PMH including CHF, CAD, T2DM, CKD who p/w shortness of breath.  The patient states that she has had a few days of cough associated with sore throat, nasal congestion, body aches, subjective fevers, and progressively worsening shortness of breath.  She endorses orthopnea and dyspnea on exertion.  No leg swelling.  She saw her PCP yesterday and had a chest x-ray.  She was instructed to come to the ER due to concerns for CHF and pneumonia.  She states she has been compliant with her medications.  She reports sick contacts at home.  The history is provided by the patient.  Shortness of Breath     Past Medical History:  Diagnosis Date  . Anemia   . Anxiety   . CAD (coronary artery disease)   . CHF (congestive heart failure) ([place])   . CKD (chronic kidney disease)   . COPD (chronic obstructive pulmonary disease) ([place])   . Depression   . Diabetes mellitus without complication ([place])   . Hypertension   . Hypothyroid   . MI (myocardial infarction) ([place])   . Sleep apnea    no cpap , "never gave me one"    Patient Active Problem List   Diagnosis Date Noted  . Upper airway cough syndrome 07/04/2018  . CHF exacerbation ([place]) 06/27/2018  . Left shoulder pain 06/27/2018  . COPD exacerbation ([place]) 03/05/2018  . CAD (coronary artery disease) 03/05/2018  . Iron deficiency anemia 02/26/2018  . Sleep apnea 11/01/2017  . HLD (hyperlipidemia) 10/19/2017  . Anxiety and depression 10/19/2017  . Chest pain 10/19/2017  . Hypoglycemia   . Hypoxia   . Bronchitis 10/07/2017  . Chronic diastolic heart failure ([place]) 10/07/2017  . Acute renal failure superimposed on stage 3 chronic kidney disease ([place]) 10/07/2017  . Hypokalemia  10/07/2017  . Normocytic normochromic anemia 10/07/2017  . Type II diabetes mellitus with renal manifestations ([place])   . Leukocytosis   . Acute blood loss anemia   . Seizures ([place])   . Coronary artery disease involving native coronary artery of native heart with angina pectoris ([place]) 09/05/2017  . History of cardiac arrest 09/03/2017  . Cardiopulmonary arrest ([place])   . Acute encephalopathy   . Acute respiratory failure with hypoxia ([place])   . Acute on chronic combined systolic and diastolic CHF (congestive heart failure) ([place]) 08/29/2017  . Essential hypertension 08/29/2017  . Type 1 diabetes mellitus without complication ([place]) 37/34/2876  . Tobacco abuse 08/29/2017  . Acute pulmonary edema ([facility])   . CKD (chronic kidney disease) stage 4, GFR 15-29 ml/min ([facility])   . Dyspnea on exertion     Past Surgical History:  Procedure Laterality Date  . APPENDECTOMY    . BACK SURGERY    . LEFT HEART CATH AND CORONARY ANGIOGRAPHY N/A 09/11/2017   Procedure: LEFT HEART CATH AND CORONARY ANGIOGRAPHY;  Surgeon: Martinique, Peter M, MD;  Location: [facility];  Service: Cardiovascular;  Laterality: N/A;  . TOTAL KNEE ARTHROPLASTY Right 2007     OB History   No obstetric history on file.      Home Medications    Prior to Admission medications   Medication Sig Start Date End  Date Taking? Authorizing Provider  ACCU-CHEK SOFTCLIX LANCETS lancets Use as instructed 10/16/17   Brayton Caves, PA-C  amLODipine (NORVASC) 10 MG tablet Take 1 tablet (10 mg total) by mouth daily. MUST MAKE APPT FOR FURTHER REFILLS Patient taking differently: Take 10 mg by mouth daily.  06/24/18   Charlott Rakes, MD  aspirin EC 81 MG tablet Take 1 tablet (81 mg total) by mouth daily. 10/16/17   Brayton Caves, PA-C  Blood Glucose Monitoring Suppl (ACCU-CHEK AVIVA) device Use as instructed 10/16/17 10/16/18  Brayton Caves, PA-C  Blood Glucose Monitoring Suppl (TRUE METRIX METER) DEVI 1 kit by Does not apply route 4 (four)  times daily. 10/16/17   Brayton Caves, PA-C  busPIRone (BUSPAR) 5 MG tablet Take 1 tablet (5 mg total) by mouth 3 (three) times daily. 01/21/18   Charlott Rakes, MD  carvedilol (COREG) 25 MG tablet TAKE 1 TABLET BY MOUTH 2 (TWO) TIMES DAILY WITH A MEAL. Patient taking differently: Take 25 mg by mouth 2 (two) times daily with a meal.  05/29/18   Charlott Rakes, MD  cetirizine (ZYRTEC) 10 MG tablet TAKE 1 TABLET (10 MG TOTAL) BY MOUTH DAILY. 03/11/18   Charlott Rakes, MD  clotrimazole-betamethasone (LOTRISONE) cream Apply 1 application topically See admin instructions. Apply to affected areas of scalp daily as directed 05/29/18   [provider]  cyclobenzaprine (FLEXERIL) 10 MG tablet Take 1 tablet (10 mg total) by mouth 3 (three) times daily as needed for muscle spasms. 06/28/18   Purohit, Konrad Dolores, MD  famotidine (PEPCID) 20 MG tablet One at bedtime 07/04/18   Tanda Rockers, MD  FLUoxetine (PROZAC) 20 MG capsule Take 20 mg by mouth daily. 05/13/18   [provider]  furosemide (LASIX) 40 MG tablet Take 2 tablets (80 mg total) by mouth every morning AND 1 tablet (40 mg total) every evening. 02/24/18 02/19/19  Croitoru, Mihai, MD  gabapentin (NEURONTIN) 300 MG capsule Take 1 capsule (300 mg total) by mouth 3 (three) times daily. 01/21/18   Charlott Rakes, MD  glucose blood (ACCU-CHEK AVIVA) test strip Use as instructed 10/16/17   Brayton Caves, PA-C  glucose blood (TRUE METRIX BLOOD GLUCOSE TEST) test strip Use as instructed 10/16/17   Brayton Caves, PA-C  guaiFENesin-dextromethorphan (ROBITUSSIN DM) 100-10 MG/5ML syrup Take 5 mLs by mouth every 4 (four) hours as needed for cough. 10/08/17   Mikhail, Velta Addison, DO  hydrALAZINE (APRESOLINE) 100 MG tablet Take 1 tablet (100 mg total) by mouth 3 (three) times daily. 01/21/18   Charlott Rakes, MD  Hyoscyamine Sulfate SL (LEVSIN/SL) 0.125 MG SUBL Place 1 tablet under the tongue every 4 (four) hours as needed. Patient taking differently: Place  1 tablet under the tongue every 4 (four) hours as needed (for abdominal cramping).  05/07/18   Charlann Lange, PA-C  insulin aspart (NOVOLOG) 100 UNIT/ML injection Inject 4 Units into the skin 3 (three) times daily with meals. Patient taking differently: Inject 4 Units into the skin 3 (three) times daily after meals.  01/21/18   Charlott Rakes, MD  Insulin Glargine (LANTUS SOLOSTAR) 100 UNIT/ML Solostar Pen Inject 20 Units into the skin 2 (two) times daily. Patient taking differently: Inject 20 Units into the skin 3 (three) times daily.  01/21/18   Charlott Rakes, MD  Insulin Pen Needle (TRUEPLUS PEN NEEDLES) 31G X 6 MM MISC Use as directed 11/08/17   Charlott Rakes, MD  ipratropium-albuterol (DUONEB) 0.5-2.5 (3) MG/3ML SOLN Take 3 mLs by nebulization every  6 (six) hours as needed. 03/07/18   Kayleen Memos, DO  ketoconazole (NIZORAL) 2 % shampoo Daily for the next week then twice a week until resolution 02/17/18   Augusto Gamble B, NP  losartan (COZAAR) 100 MG tablet Take 100 mg by mouth daily. 05/13/18   [provider]  montelukast (SINGULAIR) 10 MG tablet Take 1 tablet (10 mg total) by mouth at bedtime. 01/21/18   Charlott Rakes, MD  pantoprazole (PROTONIX) 40 MG tablet Take 1 tablet (40 mg total) by mouth daily. Take 30-60 min before first meal of the day 07/04/18   Tanda Rockers, MD  potassium chloride SA (K-DUR,KLOR-CON) 10 MEQ tablet Take 1 tablet (10 mEq total) by mouth daily. 01/21/18   Charlott Rakes, MD  rosuvastatin (CRESTOR) 40 MG tablet Take 1 tablet (40 mg total) by mouth daily. 01/21/18 07/04/18  Charlott Rakes, MD  spironolactone (ALDACTONE) 50 MG tablet Take 1 tablet (50 mg total) by mouth daily. 01/21/18   Charlott Rakes, MD  TRUEPLUS LANCETS 28G MISC 28 g by Does not apply route 4 (four) times daily. 10/16/17   Brayton Caves, PA-C    Family History Family History  Problem Relation Age of Onset  . Hypertension Mother   . Colon cancer Neg Hx   . Esophageal cancer Neg Hx      Social History Social History   Tobacco Use  . Smoking status: Never Smoker  . Smokeless tobacco: Never Used  Substance Use Topics  . Alcohol use: No    Frequency: Never  . Drug use: No     Allergies   Eggs or egg-derived products; Azithromycin; Cherry; and Latex   Review of Systems Review of Systems  Respiratory: Positive for shortness of breath.    All other systems reviewed and are negative except that which was mentioned in HPI   Physical Exam Updated Vital Signs BP 116/81 (BP Location: Right Arm)   Pulse 87   Temp 98.6 F (37 C) (Oral)   Resp 19   SpO2 95%   Physical Exam Vitals signs and nursing note reviewed.  Constitutional:      General: She is not in acute distress.    Appearance: She is well-developed.  HENT:     Head: Normocephalic and atraumatic.  Eyes:     Conjunctiva/sclera: Conjunctivae normal.  Neck:     Musculoskeletal: Neck supple.  Cardiovascular:     Rate and Rhythm: Normal rate and regular rhythm.     Heart sounds: Normal heart sounds. No murmur.  Pulmonary:     Comments: Mild dyspnea, Frequent cough which limits deep inspiration, diminished BS b/l bases Abdominal:     General: Bowel sounds are normal. There is no distension.     Palpations: Abdomen is soft.     Tenderness: There is no abdominal tenderness.  Musculoskeletal:     Right lower leg: No edema.     Left lower leg: No edema.  Skin:    General: Skin is warm and dry.  Neurological:     Mental Status: She is alert and oriented to person, place, and time.     Comments: Fluent speech  Psychiatric:        Judgment: Judgment normal.     Comments: Bizarre affect      ED Treatments / Results  Labs (all labs ordered are listed, but only abnormal results are displayed) Labs Reviewed  RESPIRATORY PANEL BY PCR  BASIC METABOLIC PANEL  CBC  URINALYSIS, ROUTINE W REFLEX  MICROSCOPIC  HEPATIC FUNCTION PANEL  BRAIN NATRIURETIC PEPTIDE  I-STAT TROPONIN, ED  I-STAT CG4  LACTIC ACID, ED    EKG EKG Interpretation  Date/Time:  Tuesday August 12 2018 18:18:38 EST Ventricular Rate:  84 PR Interval:  142 QRS Duration: 94 QT Interval:  366 QTC Calculation: 432 R Axis:   -60 Text Interpretation:  Normal sinus rhythm Left axis deviation Anterior infarct , age undetermined Abnormal ECG Interpretation limited secondary to artifact Confirmed by Theotis Burrow [phone]) on 08/12/2018 6:54:49 PM   Radiology Dg Chest 2 View  Result Date: 08/12/2018 CLINICAL DATA:  Cough, chest congestion, and fever for the past week EXAM: CHEST - 2 VIEW COMPARISON:  PA and lateral chest x-ray of June 27, 2018 FINDINGS: The lungs are adequately inflated. The interstitial markings are increased similar to to that seen previously. No air bronchograms are observed. The cardiac silhouette is mildly enlarged. The pulmonary vascularity is mildly engorged. There is calcification in the wall of the thoracic aorta. There is no pleural effusion. The bony structures are unremarkable. IMPRESSION: Findings most compatible with mild to moderate pulmonary edema in the appropriate clinical setting. Given that there is fever, the possibility of interstitial pneumonia is raised. There is no discrete alveolar infiltrate. Electronically Signed   By: David  Martinique M.D.   On: 08/12/2018 07:34    Procedures Procedures (including critical care time)  Medications Ordered in ED Medications  oseltamivir (TAMIFLU) capsule 75 mg (75 mg Oral Given 08/12/18 2001)  ipratropium-albuterol (DUONEB) 0.5-2.5 (3) MG/3ML nebulizer solution 3 mL (3 mLs Nebulization Given 08/12/18 2001)  furosemide (LASIX) injection 80 mg (80 mg Intravenous Given 08/12/18 2037)  cefTRIAXone (ROCEPHIN) 1 g in sodium chloride 0.9 % 100 mL IVPB (0 g Intravenous Stopped 08/12/18 2140)  levofloxacin (LEVAQUIN) tablet 500 mg (500 mg Oral Given 08/12/18 2058)     Initial Impression / Assessment and Plan / ED Course  I have reviewed the  triage vital signs and the nursing notes.  Pertinent labs & imaging results that were available during my care of the patient were reviewed by me and considered in my medical decision making (see chart for details).    Non-toxic on exam, O2 sat 95% on RA.  CXR shows pulmonary edema, with fevers she may have superimposed infection. WBC 27.2, further suggesting infection. Gave CTX and levaquin. BNP 340. Also gave dose of IV lasix. Cr is elevated to 3.47, baseline near 2.7.  Gave initial dose of Tamiflu.  RVP later came back negative.  Discussed admission with [facility], Dr. Tamala Julian, and patient admitted for further care.  Final Clinical Impressions(s) / ED Diagnoses   Final diagnoses:  Community acquired pneumonia, unspecified laterality  AKI (acute kidney injury) [facility])    ED Discharge Orders    None       Little, Wenda Overland, MD 08/12/18 2325

## 2018-08-12 NOTE — ED Triage Notes (Signed)
Pt is here with chf and diagnosed with PNA and received abx via IV and po.  Pt was sent here to possibly be admitted and complaining of fevers, body aches, chest pain, sob.  Pt did receive hydrocodone liquid before leaving MD

## 2018-08-12 NOTE — ED Notes (Signed)
Patient transported to X-ray 

## 2018-08-13 ENCOUNTER — Inpatient Hospital Stay (HOSPITAL_COMMUNITY)
Admission: EM | Admit: 2018-08-13 | Discharge: 2018-08-15 | Discharge status: Against Medical Advice | Payer: Medicaid Other | Source: Home / Self Care | Attending: Internal Medicine | Admitting: Internal Medicine

## 2018-08-13 ENCOUNTER — Emergency Department (HOSPITAL_COMMUNITY): Payer: Medicaid Other

## 2018-08-13 ENCOUNTER — Encounter (HOSPITAL_COMMUNITY): Payer: Self-pay | Admitting: Emergency Medicine

## 2018-08-13 DIAGNOSIS — Z881 Allergy status to other antibiotic agents status: Secondary | ICD-10-CM | POA: Diagnosis not present

## 2018-08-13 DIAGNOSIS — E1122 Type 2 diabetes mellitus with diabetic chronic kidney disease: Secondary | ICD-10-CM | POA: Diagnosis present

## 2018-08-13 DIAGNOSIS — R5381 Other malaise: Secondary | ICD-10-CM | POA: Diagnosis present

## 2018-08-13 DIAGNOSIS — I2510 Atherosclerotic heart disease of native coronary artery without angina pectoris: Secondary | ICD-10-CM | POA: Diagnosis present

## 2018-08-13 DIAGNOSIS — E785 Hyperlipidemia, unspecified: Secondary | ICD-10-CM | POA: Diagnosis present

## 2018-08-13 DIAGNOSIS — E039 Hypothyroidism, unspecified: Secondary | ICD-10-CM | POA: Diagnosis present

## 2018-08-13 DIAGNOSIS — F419 Anxiety disorder, unspecified: Secondary | ICD-10-CM | POA: Diagnosis present

## 2018-08-13 DIAGNOSIS — I5032 Chronic diastolic (congestive) heart failure: Secondary | ICD-10-CM | POA: Diagnosis not present

## 2018-08-13 DIAGNOSIS — J441 Chronic obstructive pulmonary disease with (acute) exacerbation: Secondary | ICD-10-CM | POA: Diagnosis not present

## 2018-08-13 DIAGNOSIS — N184 Chronic kidney disease, stage 4 (severe): Secondary | ICD-10-CM | POA: Diagnosis not present

## 2018-08-13 DIAGNOSIS — I5042 Chronic combined systolic (congestive) and diastolic (congestive) heart failure: Secondary | ICD-10-CM | POA: Diagnosis present

## 2018-08-13 DIAGNOSIS — I130 Hypertensive heart and chronic kidney disease with heart failure and stage 1 through stage 4 chronic kidney disease, or unspecified chronic kidney disease: Secondary | ICD-10-CM | POA: Diagnosis not present

## 2018-08-13 DIAGNOSIS — F329 Major depressive disorder, single episode, unspecified: Secondary | ICD-10-CM | POA: Diagnosis present

## 2018-08-13 DIAGNOSIS — N183 Chronic kidney disease, stage 3 (moderate): Secondary | ICD-10-CM | POA: Diagnosis present

## 2018-08-13 DIAGNOSIS — J189 Pneumonia, unspecified organism: Principal | ICD-10-CM

## 2018-08-13 DIAGNOSIS — G894 Chronic pain syndrome: Secondary | ICD-10-CM | POA: Diagnosis present

## 2018-08-13 DIAGNOSIS — Z7982 Long term (current) use of aspirin: Secondary | ICD-10-CM | POA: Diagnosis not present

## 2018-08-13 DIAGNOSIS — J440 Chronic obstructive pulmonary disease with acute lower respiratory infection: Secondary | ICD-10-CM | POA: Diagnosis present

## 2018-08-13 DIAGNOSIS — J9801 Acute bronchospasm: Secondary | ICD-10-CM | POA: Diagnosis present

## 2018-08-13 DIAGNOSIS — Z96651 Presence of right artificial knee joint: Secondary | ICD-10-CM | POA: Diagnosis present

## 2018-08-13 DIAGNOSIS — Z91040 Latex allergy status: Secondary | ICD-10-CM | POA: Diagnosis not present

## 2018-08-13 DIAGNOSIS — N179 Acute kidney failure, unspecified: Secondary | ICD-10-CM | POA: Diagnosis present

## 2018-08-13 DIAGNOSIS — E875 Hyperkalemia: Secondary | ICD-10-CM

## 2018-08-13 DIAGNOSIS — I252 Old myocardial infarction: Secondary | ICD-10-CM | POA: Diagnosis not present

## 2018-08-13 DIAGNOSIS — Z794 Long term (current) use of insulin: Secondary | ICD-10-CM | POA: Diagnosis not present

## 2018-08-13 DIAGNOSIS — N289 Disorder of kidney and ureter, unspecified: Secondary | ICD-10-CM | POA: Diagnosis not present

## 2018-08-13 DIAGNOSIS — G4730 Sleep apnea, unspecified: Secondary | ICD-10-CM | POA: Diagnosis present

## 2018-08-13 DIAGNOSIS — D509 Iron deficiency anemia, unspecified: Secondary | ICD-10-CM | POA: Diagnosis present

## 2018-08-13 DIAGNOSIS — Z79899 Other long term (current) drug therapy: Secondary | ICD-10-CM | POA: Diagnosis not present

## 2018-08-13 LAB — HEMOGLOBIN A1C
Hgb A1c MFr Bld: 7.7 % — ABNORMAL HIGH (ref 4.8–5.6)
Mean Plasma Glucose: 174.29 mg/dL

## 2018-08-13 LAB — CBC WITH DIFFERENTIAL/PLATELET
Abs Immature Granulocytes: 0.1 10*3/uL — ABNORMAL HIGH (ref 0.00–0.07)
Basophils Absolute: 0.1 10*3/uL (ref 0.0–0.1)
Basophils Relative: 0 %
Eosinophils Absolute: 0.1 10*3/uL (ref 0.0–0.5)
Eosinophils Relative: 0 %
HCT: 28.9 % — ABNORMAL LOW (ref 36.0–46.0)
Hemoglobin: 8.8 g/dL — ABNORMAL LOW (ref 12.0–15.0)
Immature Granulocytes: 1 %
Lymphocytes Relative: 12 %
Lymphs Abs: 2.4 10*3/uL (ref 0.7–4.0)
MCH: 23.7 pg — ABNORMAL LOW (ref 26.0–34.0)
MCHC: 30.4 g/dL (ref 30.0–36.0)
MCV: 77.7 fL — ABNORMAL LOW (ref 80.0–100.0)
Monocytes Absolute: 1.5 10*3/uL — ABNORMAL HIGH (ref 0.1–1.0)
Monocytes Relative: 8 %
NEUTROS ABS: 15.4 10*3/uL — AB (ref 1.7–7.7)
Neutrophils Relative %: 79 %
Platelets: 291 10*3/uL (ref 150–400)
RBC: 3.72 MIL/uL — AB (ref 3.87–5.11)
RDW: 18.8 % — ABNORMAL HIGH (ref 11.5–15.5)
WBC: 19.5 10*3/uL — ABNORMAL HIGH (ref 4.0–10.5)
nRBC: 0 % (ref 0.0–0.2)

## 2018-08-13 LAB — GLUCOSE, CAPILLARY
Glucose-Capillary: 120 mg/dL — ABNORMAL HIGH (ref 70–99)
Glucose-Capillary: 240 mg/dL — ABNORMAL HIGH (ref 70–99)

## 2018-08-13 LAB — COMPREHENSIVE METABOLIC PANEL
ALBUMIN: 3.6 g/dL (ref 3.5–5.0)
ALT: 16 U/L (ref 0–44)
AST: 23 U/L (ref 15–41)
Alkaline Phosphatase: 54 U/L (ref 38–126)
Anion gap: 14 (ref 5–15)
BUN: 99 mg/dL — AB (ref 8–23)
CO2: 21 mmol/L — ABNORMAL LOW (ref 22–32)
Calcium: 10 mg/dL (ref 8.9–10.3)
Chloride: 102 mmol/L (ref 98–111)
Creatinine, Ser: 3.72 mg/dL — ABNORMAL HIGH (ref 0.44–1.00)
GFR calc Af Amer: 14 mL/min — ABNORMAL LOW (ref >60)
GFR calc non Af Amer: 12 mL/min — ABNORMAL LOW (ref >60)
Glucose, Bld: 298 mg/dL — ABNORMAL HIGH (ref 70–99)
POTASSIUM: 5.5 mmol/L — AB (ref 3.5–5.1)
Sodium: 137 mmol/L (ref 135–145)
Total Bilirubin: 0.1 mg/dL — ABNORMAL LOW (ref 0.3–1.2)
Total Protein: 7.5 g/dL (ref 6.5–8.1)

## 2018-08-13 LAB — POTASSIUM: POTASSIUM: 5.2 mmol/L — AB (ref 3.5–5.1)

## 2018-08-13 LAB — PROCALCITONIN: PROCALCITONIN: 0.12 ng/mL

## 2018-08-13 LAB — TSH: TSH: 4.567 u[IU]/mL — ABNORMAL HIGH (ref 0.350–4.500)

## 2018-08-13 LAB — I-STAT CG4 LACTIC ACID, ED: Lactic Acid, Venous: 1.41 mmol/L (ref 0.5–1.9)

## 2018-08-13 LAB — TROPONIN I

## 2018-08-13 MED ORDER — FAMOTIDINE 20 MG PO TABS
10.0000 mg | ORAL_TABLET | Freq: Every day | ORAL | Status: DC
  Administered 2018-08-13: 10 mg via ORAL
  Filled 2018-08-13 (×2): qty 1

## 2018-08-13 MED ORDER — SODIUM CHLORIDE 0.9% FLUSH: 3.0000 mL | Freq: Two times a day (BID) | INTRAVENOUS | Status: DC

## 2018-08-13 MED ORDER — MONTELUKAST SODIUM 10 MG PO TABS
10.0000 mg | ORAL_TABLET | Freq: Every day | ORAL | Status: DC
  Administered 2018-08-13 – 2018-08-14 (×2): 10 mg via ORAL
  Filled 2018-08-13 (×2): qty 1

## 2018-08-13 MED ORDER — FUROSEMIDE 40 MG PO TABS
40.0000 mg | ORAL_TABLET | Freq: Every day | ORAL | Status: DC
  Administered 2018-08-13: 40 mg via ORAL
  Filled 2018-08-13: qty 1

## 2018-08-13 MED ORDER — SODIUM CHLORIDE 0.9 % IV BOLUS (SEPSIS)
1000.0000 mL | Freq: Once | INTRAVENOUS | Status: AC
  Administered 2018-08-13: 1000 mL via INTRAVENOUS

## 2018-08-13 MED ORDER — INSULIN ASPART 100 UNIT/ML ~~LOC~~ SOLN
0.0000 [IU] | Freq: Three times a day (TID) | SUBCUTANEOUS | Status: DC
  Administered 2018-08-14: 5 [IU] via SUBCUTANEOUS
  Administered 2018-08-14: 7 [IU] via SUBCUTANEOUS

## 2018-08-13 MED ORDER — ALBUTEROL SULFATE (2.5 MG/3ML) 0.083% IN NEBU
2.5000 mg | INHALATION_SOLUTION | Freq: Four times a day (QID) | RESPIRATORY_TRACT | Status: DC
  Administered 2018-08-13 – 2018-08-14 (×3): 2.5 mg via RESPIRATORY_TRACT
  Filled 2018-08-13 (×3): qty 3

## 2018-08-13 MED ORDER — LEVOFLOXACIN IN D5W 500 MG/100ML IV SOLN: 500.0000 mg | INTRAVENOUS | Status: DC

## 2018-08-13 MED ORDER — FUROSEMIDE 10 MG/ML IJ SOLN: 80.0000 mg | Freq: Two times a day (BID) | INTRAMUSCULAR | Status: DC

## 2018-08-13 MED ORDER — HYDRALAZINE HCL 50 MG PO TABS
100.0000 mg | ORAL_TABLET | Freq: Three times a day (TID) | ORAL | Status: DC
  Administered 2018-08-13 – 2018-08-14 (×2): 100 mg via ORAL
  Filled 2018-08-13 (×2): qty 2

## 2018-08-13 MED ORDER — ASPIRIN EC 81 MG PO TBEC
81.0000 mg | DELAYED_RELEASE_TABLET | Freq: Every day | ORAL | Status: DC
  Administered 2018-08-13 – 2018-08-14 (×2): 81 mg via ORAL
  Filled 2018-08-13 (×2): qty 1

## 2018-08-13 MED ORDER — HYOSCYAMINE SULFATE SL 0.125 MG SL SUBL: 1.0000 | SUBLINGUAL_TABLET | SUBLINGUAL | Status: DC | PRN

## 2018-08-13 MED ORDER — SODIUM POLYSTYRENE SULFONATE 15 GM/60ML PO SUSP
15.0000 g | Freq: Once | ORAL | Status: DC
  Filled 2018-08-13: qty 60

## 2018-08-13 MED ORDER — INSULIN GLARGINE 100 UNIT/ML ~~LOC~~ SOLN
20.0000 [IU] | Freq: Two times a day (BID) | SUBCUTANEOUS | Status: DC
  Administered 2018-08-14 (×2): 20 [IU] via SUBCUTANEOUS
  Filled 2018-08-13 (×2): qty 0.2

## 2018-08-13 MED ORDER — LEVOFLOXACIN IN D5W 750 MG/150ML IV SOLN
750.0000 mg | Freq: Once | INTRAVENOUS | Status: AC
  Administered 2018-08-13: 750 mg via INTRAVENOUS
  Filled 2018-08-13: qty 150

## 2018-08-13 MED ORDER — ALBUTEROL (5 MG/ML) CONTINUOUS INHALATION SOLN
10.0000 mg/h | INHALATION_SOLUTION | Freq: Once | RESPIRATORY_TRACT | Status: AC
  Administered 2018-08-13: 10 mg/h via RESPIRATORY_TRACT
  Filled 2018-08-13: qty 20

## 2018-08-13 MED ORDER — ALBUTEROL SULFATE (2.5 MG/3ML) 0.083% IN NEBU: 2.5000 mg | INHALATION_SOLUTION | RESPIRATORY_TRACT | Status: DC | PRN

## 2018-08-13 MED ORDER — ONDANSETRON HCL 4 MG/2ML IJ SOLN: 4.0000 mg | Freq: Four times a day (QID) | INTRAMUSCULAR | Status: DC | PRN

## 2018-08-13 MED ORDER — INSULIN ASPART 100 UNIT/ML ~~LOC~~ SOLN
0.0000 [IU] | Freq: Every day | SUBCUTANEOUS | Status: DC
  Administered 2018-08-13: 2 [IU] via SUBCUTANEOUS

## 2018-08-13 MED ORDER — SODIUM CHLORIDE 0.9% FLUSH: 3.0000 mL | INTRAVENOUS | Status: DC | PRN

## 2018-08-13 MED ORDER — GABAPENTIN 300 MG PO CAPS
300.0000 mg | ORAL_CAPSULE | Freq: Three times a day (TID) | ORAL | Status: DC
  Administered 2018-08-13 – 2018-08-14 (×4): 300 mg via ORAL
  Filled 2018-08-13 (×4): qty 1

## 2018-08-13 MED ORDER — PANTOPRAZOLE SODIUM 40 MG PO TBEC
40.0000 mg | DELAYED_RELEASE_TABLET | Freq: Every day | ORAL | Status: DC
  Administered 2018-08-14: 40 mg via ORAL
  Filled 2018-08-13: qty 1

## 2018-08-13 MED ORDER — LEVOFLOXACIN 500 MG PO TABS: 500.0000 mg | ORAL_TABLET | ORAL | Status: DC

## 2018-08-13 MED ORDER — GUAIFENESIN-DM 100-10 MG/5ML PO SYRP
5.0000 mL | ORAL_SOLUTION | ORAL | Status: DC | PRN
  Administered 2018-08-13: 5 mL via ORAL
  Filled 2018-08-13: qty 5

## 2018-08-13 MED ORDER — CYCLOBENZAPRINE HCL 10 MG PO TABS: 10.0000 mg | ORAL_TABLET | Freq: Three times a day (TID) | ORAL | Status: DC | PRN

## 2018-08-13 MED ORDER — SODIUM CHLORIDE 0.9 % IV SOLN: 250.0000 mL | INTRAVENOUS | Status: DC | PRN

## 2018-08-13 MED ORDER — IPRATROPIUM BROMIDE 0.02 % IN SOLN
0.5000 mg | Freq: Four times a day (QID) | RESPIRATORY_TRACT | Status: DC
  Administered 2018-08-13 – 2018-08-14 (×3): 0.5 mg via RESPIRATORY_TRACT
  Filled 2018-08-13 (×3): qty 2.5

## 2018-08-13 MED ORDER — SPIRONOLACTONE 25 MG PO TABS: 50.0000 mg | ORAL_TABLET | Freq: Every day | ORAL | Status: DC

## 2018-08-13 MED ORDER — BUSPIRONE HCL 5 MG PO TABS
5.0000 mg | ORAL_TABLET | Freq: Three times a day (TID) | ORAL | Status: DC
  Administered 2018-08-13 – 2018-08-14 (×4): 5 mg via ORAL
  Filled 2018-08-13 (×4): qty 1

## 2018-08-13 MED ORDER — AMLODIPINE BESYLATE 10 MG PO TABS
10.0000 mg | ORAL_TABLET | Freq: Every day | ORAL | Status: DC
  Administered 2018-08-14: 10 mg via ORAL
  Filled 2018-08-13: qty 1

## 2018-08-13 MED ORDER — FLUOXETINE HCL 20 MG PO CAPS
20.0000 mg | ORAL_CAPSULE | Freq: Every day | ORAL | Status: DC
  Administered 2018-08-13: 20 mg via ORAL
  Filled 2018-08-13 (×2): qty 1

## 2018-08-13 MED ORDER — HYOSCYAMINE SULFATE 0.125 MG SL SUBL
0.1250 mg | SUBLINGUAL_TABLET | SUBLINGUAL | Status: DC | PRN
  Administered 2018-08-13 – 2018-08-14 (×2): 0.125 mg via SUBLINGUAL
  Filled 2018-08-13 (×4): qty 1

## 2018-08-13 MED ORDER — ACETAMINOPHEN 325 MG PO TABS: 650.0000 mg | ORAL_TABLET | ORAL | Status: DC | PRN

## 2018-08-13 MED ORDER — LORATADINE 10 MG PO TABS
10.0000 mg | ORAL_TABLET | Freq: Every day | ORAL | Status: DC
  Administered 2018-08-14: 10 mg via ORAL
  Filled 2018-08-13: qty 1

## 2018-08-13 MED ORDER — SODIUM CHLORIDE 0.9% FLUSH
3.0000 mL | Freq: Two times a day (BID) | INTRAVENOUS | Status: DC
  Administered 2018-08-13 – 2018-08-14 (×3): 3 mL via INTRAVENOUS

## 2018-08-13 MED ORDER — HEPARIN SODIUM (PORCINE) 5000 UNIT/ML IJ SOLN
5000.0000 [IU] | Freq: Three times a day (TID) | INTRAMUSCULAR | Status: DC
  Administered 2018-08-13 – 2018-08-14 (×2): 5000 [IU] via SUBCUTANEOUS
  Filled 2018-08-13 (×4): qty 1

## 2018-08-13 MED ORDER — CARVEDILOL 25 MG PO TABS
25.0000 mg | ORAL_TABLET | Freq: Two times a day (BID) | ORAL | Status: DC
  Administered 2018-08-14 (×2): 25 mg via ORAL
  Filled 2018-08-13 (×2): qty 1

## 2018-08-13 NOTE — ED Notes (Signed)
Pt placwed on 2/o2 per [employer] after sat noted at 89%

## 2018-08-13 NOTE — ED Triage Notes (Signed)
Pt was seen last night, diagnosed with chf and pneumonia, was set for admission but states she was too uncomfortable on stretcher to wait so she left AMA, pt now stating she is too sick to go home.

## 2018-08-13 NOTE — ED Provider Notes (Signed)
[facility] EMERGENCY DEPARTMENT Provider Note   CSN: [id number] Arrival date & time: 08/13/18  1055     History   Chief Complaint Chief Complaint  Patient presents with  . Pneumonia    HPI Brenda Sims is a 63 y.o. female.  HPI   She presents for evaluation of cough with fever and general achiness.  Onset of symptoms several days ago, worsening now.  Last night she was seen in the ED, admitted, medication started and she left AMA around 2 AM.  She decided to come back to be seen and treated because she "blacked out," and feels worse.  She denies focal weakness or paresthesia.  She has not vomited.  There are no other known modifying factors.  Past Medical History:  Diagnosis Date  . Anemia   . Anxiety   . CAD (coronary artery disease)   . CHF (congestive heart failure) ([place])   . CKD (chronic kidney disease)   . COPD (chronic obstructive pulmonary disease) ([place])   . Depression   . Diabetes mellitus without complication ([place])   . Hypertension   . Hypothyroid   . MI (myocardial infarction) ([place])   . Sleep apnea    no cpap , "never gave me one"    Patient Active Problem List   Diagnosis Date Noted  . Upper airway cough syndrome 07/04/2018  . CHF exacerbation ([place]) 06/27/2018  . Left shoulder pain 06/27/2018  . COPD exacerbation ([place]) 03/05/2018  . CAD (coronary artery disease) 03/05/2018  . Iron deficiency anemia 02/26/2018  . Sleep apnea 11/01/2017  . HLD (hyperlipidemia) 10/19/2017  . Anxiety and depression 10/19/2017  . Chest pain 10/19/2017  . Hypoglycemia   . Hypoxia   . Bronchitis 10/07/2017  . Chronic diastolic heart failure ([place]) 10/07/2017  . Acute renal failure superimposed on stage 3 chronic kidney disease ([place]) 10/07/2017  . Hypokalemia 10/07/2017  . Normocytic normochromic anemia 10/07/2017  . Type II diabetes mellitus with renal manifestations ([place])   . Leukocytosis   . Acute blood loss anemia   . Seizures ([place])   . Coronary  artery disease involving native coronary artery of native heart with angina pectoris ([place]) 09/05/2017  . History of cardiac arrest 09/03/2017  . Cardiopulmonary arrest ([place])   . Acute encephalopathy   . Acute respiratory failure with hypoxia ([place])   . Acute on chronic combined systolic and diastolic CHF (congestive heart failure) ([place]) 08/29/2017  . Essential hypertension 08/29/2017  . Type 1 diabetes mellitus without complication ([place]) 83/41/9622  . Tobacco abuse 08/29/2017  . Acute pulmonary edema (HCC)   . CKD (chronic kidney disease) stage 4, GFR 15-29 ml/min (HCC)   . Dyspnea on exertion     Past Surgical History:  Procedure Laterality Date  . APPENDECTOMY    . BACK SURGERY    . LEFT HEART CATH AND CORONARY ANGIOGRAPHY N/A 09/11/2017   Procedure: LEFT HEART CATH AND CORONARY ANGIOGRAPHY;  Surgeon: Martinique, Peter M, MD;  Location: [facility];  Service: Cardiovascular;  Laterality: N/A;  . TOTAL KNEE ARTHROPLASTY Right 2007     OB History   No obstetric history on file.      Home Medications    Prior to Admission medications   Medication Sig Start Date End Date Taking? Authorizing Provider  ACCU-CHEK SOFTCLIX LANCETS lancets Use as instructed 10/16/17   Brayton Caves, PA-C  amLODipine (NORVASC) 10 MG tablet Take 1 tablet (10 mg total) by mouth daily. MUST MAKE  APPT FOR FURTHER REFILLS Patient taking differently: Take 10 mg by mouth daily.  06/24/18   Charlott Rakes, MD  aspirin EC 81 MG tablet Take 1 tablet (81 mg total) by mouth daily. 10/16/17   Brayton Caves, PA-C  Blood Glucose Monitoring Suppl (ACCU-CHEK AVIVA) device Use as instructed 10/16/17 10/16/18  Brayton Caves, PA-C  Blood Glucose Monitoring Suppl (TRUE METRIX METER) DEVI 1 kit by Does not apply route 4 (four) times daily. 10/16/17   Brayton Caves, PA-C  busPIRone (BUSPAR) 5 MG tablet Take 1 tablet (5 mg total) by mouth 3 (three) times daily. 01/21/18   Charlott Rakes, MD  carvedilol (COREG) 25 MG tablet  TAKE 1 TABLET BY MOUTH 2 (TWO) TIMES DAILY WITH A MEAL. Patient taking differently: Take 25 mg by mouth 2 (two) times daily with a meal.  05/29/18   Charlott Rakes, MD  cetirizine (ZYRTEC) 10 MG tablet TAKE 1 TABLET (10 MG TOTAL) BY MOUTH DAILY. 03/11/18   Charlott Rakes, MD  clotrimazole-betamethasone (LOTRISONE) cream Apply 1 application topically See admin instructions. Apply to affected areas of scalp daily as directed 05/29/18   [provider]  cyclobenzaprine (FLEXERIL) 10 MG tablet Take 1 tablet (10 mg total) by mouth 3 (three) times daily as needed for muscle spasms. 06/28/18   Purohit, Konrad Dolores, MD  famotidine (PEPCID) 20 MG tablet One at bedtime 07/04/18   Tanda Rockers, MD  FLUoxetine (PROZAC) 20 MG capsule Take 20 mg by mouth daily. 05/13/18   [provider]  furosemide (LASIX) 40 MG tablet Take 2 tablets (80 mg total) by mouth every morning AND 1 tablet (40 mg total) every evening. 02/24/18 02/19/19  Croitoru, Mihai, MD  gabapentin (NEURONTIN) 300 MG capsule Take 1 capsule (300 mg total) by mouth 3 (three) times daily. 01/21/18   Charlott Rakes, MD  glucose blood (ACCU-CHEK AVIVA) test strip Use as instructed 10/16/17   Brayton Caves, PA-C  glucose blood (TRUE METRIX BLOOD GLUCOSE TEST) test strip Use as instructed 10/16/17   Brayton Caves, PA-C  guaiFENesin-dextromethorphan (ROBITUSSIN DM) 100-10 MG/5ML syrup Take 5 mLs by mouth every 4 (four) hours as needed for cough. 10/08/17   Mikhail, Velta Addison, DO  hydrALAZINE (APRESOLINE) 100 MG tablet Take 1 tablet (100 mg total) by mouth 3 (three) times daily. 01/21/18   Charlott Rakes, MD  Hyoscyamine Sulfate SL (LEVSIN/SL) 0.125 MG SUBL Place 1 tablet under the tongue every 4 (four) hours as needed. Patient taking differently: Place 1 tablet under the tongue every 4 (four) hours as needed (for abdominal cramping).  05/07/18   Charlann Lange, PA-C  insulin aspart (NOVOLOG) 100 UNIT/ML injection Inject 4 Units into the skin 3  (three) times daily with meals. Patient taking differently: Inject 4 Units into the skin 3 (three) times daily after meals.  01/21/18   Charlott Rakes, MD  Insulin Glargine (LANTUS SOLOSTAR) 100 UNIT/ML Solostar Pen Inject 20 Units into the skin 2 (two) times daily. Patient taking differently: Inject 20 Units into the skin 3 (three) times daily.  01/21/18   Charlott Rakes, MD  Insulin Pen Needle (TRUEPLUS PEN NEEDLES) 31G X 6 MM MISC Use as directed 11/08/17   Charlott Rakes, MD  ipratropium-albuterol (DUONEB) 0.5-2.5 (3) MG/3ML SOLN Take 3 mLs by nebulization every 6 (six) hours as needed. 03/07/18   Kayleen Memos, DO  ketoconazole (NIZORAL) 2 % shampoo Daily for the next week then twice a week until resolution 02/17/18   Zigmund Gottron, NP  losartan (COZAAR) 100 MG tablet Take 100 mg by mouth daily. 05/13/18   [provider]  montelukast (SINGULAIR) 10 MG tablet Take 1 tablet (10 mg total) by mouth at bedtime. 01/21/18   Charlott Rakes, MD  pantoprazole (PROTONIX) 40 MG tablet Take 1 tablet (40 mg total) by mouth daily. Take 30-60 min before first meal of the day 07/04/18   Tanda Rockers, MD  potassium chloride SA (K-DUR,KLOR-CON) 10 MEQ tablet Take 1 tablet (10 mEq total) by mouth daily. 01/21/18   Charlott Rakes, MD  rosuvastatin (CRESTOR) 40 MG tablet Take 1 tablet (40 mg total) by mouth daily. 01/21/18 08/12/18  Charlott Rakes, MD  spironolactone (ALDACTONE) 50 MG tablet Take 1 tablet (50 mg total) by mouth daily. 01/21/18   Charlott Rakes, MD  TRUEPLUS LANCETS 28G MISC 28 g by Does not apply route 4 (four) times daily. 10/16/17   Brayton Caves, PA-C    Family History Family History  Problem Relation Age of Onset  . Hypertension Mother   . Colon cancer Neg Hx   . Esophageal cancer Neg Hx     Social History Social History   Tobacco Use  . Smoking status: Never Smoker  . Smokeless tobacco: Never Used  Substance Use Topics  . Alcohol use: No    Frequency: Never  . Drug  use: No     Allergies   Eggs or egg-derived products; Azithromycin; Cherry; and Latex   Review of Systems Review of Systems  All other systems reviewed and are negative.    Physical Exam Updated Vital Signs BP 117/74   Pulse 71   Temp (!) 97.5 F (36.4 C) (Oral)   Resp 15   SpO2 99%   Physical Exam Vitals signs and nursing note reviewed.  Constitutional:      Appearance: Normal appearance. She is well-developed.     Comments: Overweight  HENT:     Head: Normocephalic and atraumatic.  Eyes:     Conjunctiva/sclera: Conjunctivae normal.     Pupils: Pupils are equal, round, and reactive to light.  Neck:     Musculoskeletal: Normal range of motion and neck supple.     Trachea: Phonation normal.  Cardiovascular:     Rate and Rhythm: Normal rate and regular rhythm.  Pulmonary:     Effort: Pulmonary effort is normal.     Comments: Decreased air movement, bilateral lung fields.  Few scattered rhonchi and wheezes.  There is no increased work of breathing. Chest:     Chest wall: No tenderness.  Abdominal:     General: There is no distension.     Palpations: Abdomen is soft.     Tenderness: There is no abdominal tenderness. There is no guarding.  Musculoskeletal: Normal range of motion.        General: No swelling or tenderness.  Skin:    General: Skin is warm and dry.  Neurological:     Mental Status: She is alert and oriented to person, place, and time.     Motor: No abnormal muscle tone.  Psychiatric:        Mood and Affect: Mood normal.        Behavior: Behavior normal.        Thought Content: Thought content normal.        Judgment: Judgment normal.      ED Treatments / Results  Labs (all labs ordered are listed, but only abnormal results are displayed) Labs Reviewed  COMPREHENSIVE METABOLIC PANEL -  Abnormal; Notable for the following components:      Result Value   Potassium 5.5 (*)    CO2 21 (*)    Glucose, Bld 298 (*)    BUN 99 (*)    Creatinine,  Ser 3.72 (*)    Total Bilirubin 0.1 (*)    GFR calc non Af Amer 12 (*)    GFR calc Af Amer 14 (*)    All other components within normal limits  CBC WITH DIFFERENTIAL/PLATELET - Abnormal; Notable for the following components:   WBC 19.5 (*)    RBC 3.72 (*)    Hemoglobin 8.8 (*)    HCT 28.9 (*)    MCV 77.7 (*)    MCH 23.7 (*)    RDW 18.8 (*)    Neutro Abs 15.4 (*)    Monocytes Absolute 1.5 (*)    Abs Immature Granulocytes 0.10 (*)    All other components within normal limits  CULTURE, BLOOD (ROUTINE X 2)  CULTURE, BLOOD (ROUTINE X 2)  URINE CULTURE  URINALYSIS, ROUTINE W REFLEX MICROSCOPIC  I-STAT CG4 LACTIC ACID, ED    EKG None  Radiology Dg Chest 2 View  Result Date: 08/12/2018 CLINICAL DATA:  Dyspnea EXAM: CHEST - 2 VIEW COMPARISON:  08/11/2018 chest radiograph. FINDINGS: Stable cardiomediastinal silhouette with mild cardiomegaly. No pneumothorax. No pleural effusion. Mild hazy opacities in the parahilar and lower lungs bilaterally, similar. IMPRESSION: Stable mild cardiomegaly. Stable mild hazy parahilar and lower lung opacities, differential includes mild congestive heart failure or atypical pneumonia. Continued chest radiograph follow-up advised. Electronically Signed   By: Ilona Sorrel M.D.   On: 08/12/2018 19:54   Dg Chest 2 View  Result Date: 08/12/2018 CLINICAL DATA:  Cough, chest congestion, and fever for the past week EXAM: CHEST - 2 VIEW COMPARISON:  PA and lateral chest x-ray of June 27, 2018 FINDINGS: The lungs are adequately inflated. The interstitial markings are increased similar to to that seen previously. No air bronchograms are observed. The cardiac silhouette is mildly enlarged. The pulmonary vascularity is mildly engorged. There is calcification in the wall of the thoracic aorta. There is no pleural effusion. The bony structures are unremarkable. IMPRESSION: Findings most compatible with mild to moderate pulmonary edema in the appropriate clinical setting.  Given that there is fever, the possibility of interstitial pneumonia is raised. There is no discrete alveolar infiltrate. Electronically Signed   By: David  Martinique M.D.   On: 08/12/2018 07:34   Dg Chest Port 1 View  Result Date: 08/13/2018 CLINICAL DATA:  Pneumonia EXAM: PORTABLE CHEST 1 VIEW COMPARISON:  August 12, 2018 FINDINGS: The mediastinal contour is normal. The heart size is enlarged. Mild hazy increased pulmonary interstitium is identified in both lungs more prominently in both lung bases. There is no pleural effusion. The visualized skeletal structures are stable. IMPRESSION: Mild cardiomegaly. Probable mild interstitial edema unchanged compared prior exam. Electronically Signed   By: Abelardo Diesel M.D.   On: 08/13/2018 13:49    Procedures .Critical Care Performed by: Daleen Bo, MD Authorized by: Daleen Bo, MD   Critical care provider statement:    Critical care time (minutes):  35   Critical care start time:  08/13/2018 1:00 PM   Critical care end time:  08/13/2018 3:21 PM   Critical care time was exclusive of:  Separately billable procedures and treating other patients   Critical care was necessary to treat or prevent imminent or life-threatening deterioration of the following conditions:  Respiratory failure  Critical care was time spent personally by me on the following activities:  Blood draw for specimens, development of treatment plan with patient or surrogate, discussions with consultants, evaluation of patient's response to treatment, examination of patient, obtaining history from patient or surrogate, ordering and performing treatments and interventions, ordering and review of laboratory studies, pulse oximetry, re-evaluation of patient's condition, review of old charts and ordering and review of radiographic studies   (including critical care time)  Medications Ordered in ED Medications  levofloxacin (LEVAQUIN) IVPB 500 mg (has no administration in time range)    sodium chloride 0.9 % bolus 1,000 mL (0 mLs Intravenous Stopped 08/13/18 1513)    And  sodium chloride 0.9 % bolus 1,000 mL (1,000 mLs Intravenous New Bag/Given 08/13/18 1436)    And  sodium chloride 0.9 % bolus 1,000 mL (1,000 mLs Intravenous New Bag/Given 08/13/18 1452)  levofloxacin (LEVAQUIN) IVPB 750 mg (0 mg Intravenous Stopped 08/13/18 1513)  albuterol (PROVENTIL,VENTOLIN) solution continuous neb (10 mg/hr Nebulization Given 08/13/18 1417)     Initial Impression / Assessment and Plan / ED Course  I have reviewed the triage vital signs and the nursing notes.  Pertinent labs & imaging results that were available during my care of the patient were reviewed by me and considered in my medical decision making (see chart for details).  Clinical Course as of Aug 13 1520  Wed Aug 13, 2018  1513 Normal except white count high, hemoglobin low, MCV low  CBC WITH DIFFERENTIAL(!) [EW]  1513 Normal except potassium high, CO2 high, glucose high, BUN high, creatinine high, GFR low, total bilirubin low  Comprehensive metabolic panel(!) [EW]  2263 Laboratory trending indicates improving white count, stable hemoglobin, stable GFR   [EW]    Clinical Course User Index [EW] Daleen Bo, MD     Patient Vitals for the past 24 hrs:  BP Temp Temp src Pulse Resp SpO2  08/13/18 1430 117/74 - - 71 15 99 %  08/13/18 1417 - - - - - 90 %  08/13/18 1400 110/63 - - 72 16 95 %  08/13/18 1330 (!) 116/51 - - 68 15 91 %  08/13/18 1300 138/80 - - (!) 52 - 100 %  08/13/18 1245 126/62 - - 76 - 91 %  08/13/18 1123 122/66 (!) 97.5 F (36.4 C) Oral 76 20 100 %    3:17 PM Reevaluation with update and discussion. After initial assessment and treatment, an updated evaluation reveals she feels better after the 1 hour nebulizer treatment.  She again agrees to stay in the hospital this time.Daleen Bo   Medical Decision Making: Patient acquired pneumonia, patient initially admitted overnight, returned when felt worse  at home.  She reports possible syncope.  Doubt ACS.  Doubt severe sepsis, metabolic instability or impending vascular collapse.  CRITICAL CARE-yes Performed by: Daleen Bo  Nursing Notes Reviewed/ Care Coordinated Applicable Imaging Reviewed Interpretation of Laboratory Data incorporated into ED treatment  3:22 PM-Consult complete with hospitalist. Patient case explained and discussed.  He agrees to admit patient for further evaluation and treatment. Call ended at 20 6 PM   Final Clinical Impressions(s) / ED Diagnoses   Final diagnoses:  Community acquired pneumonia, unspecified laterality  Bronchospasm  Renal insufficiency  Hyperkalemia    ED Discharge Orders    None       Daleen Bo, MD 08/13/18 1531

## 2018-08-13 NOTE — Progress Notes (Signed)
Pharmacy Antibiotic Note  Brenda Sims is a 63 y.o. female admitted on 08/12/2018 with pneumonia.  Pharmacy has been consulted for Levaquin dosing.  Plan: Levaquin 500mg  PO Q48H.  Height: 5' 4.5" (163.8 cm) Weight: 194 lb 14.2 oz (88.4 kg) IBW/kg (Calculated) : 55.85  Temp (24hrs), Avg:98.6 F (37 C), Min:98.6 F (37 C), Max:98.6 F (37 C)  Recent Labs  Lab 08/12/18 2028 08/12/18 2036  WBC 27.2*  --   CREATININE 3.47*  --   LATICACIDVEN  --  1.05    Estimated Creatinine Clearance: 18.3 mL/min (A) (by C-G formula based on SCr of 3.47 mg/dL (H)).    Allergies  Allergen Reactions  . Eggs Or Egg-Derived Products Shortness Of Breath and Rash  . Azithromycin Swelling    Facial/throat swelling  . Cherry Swelling    Facial swelling  . Latex Rash    Thank you for allowing pharmacy to be a part of this patient's care.  Wynona Neat, PharmD, BCPS  08/13/2018 2:09 AM

## 2018-08-13 NOTE — ED Notes (Signed)
Admitting at bedside 

## 2018-08-13 NOTE — ED Notes (Signed)
Pt wants to leave AMA, admitting at bedside explaining risk of leaving. Pt still wanting to leave. penpad is not working. AMA witnessed by this RN and Anderson Malta, Therapist, sports.
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
## 2018-08-13 NOTE — Progress Notes (Signed)
Alert and oriented patient admitted from ED. Pt oriented to room and call bell. No distress noted, v/s obtained and recorded.

## 2018-08-14 ENCOUNTER — Other Ambulatory Visit: Payer: Self-pay

## 2018-08-14 ENCOUNTER — Telehealth: Payer: Self-pay | Admitting: *Deleted

## 2018-08-14 LAB — CBC
HCT: 25.6 % — ABNORMAL LOW (ref 36.0–46.0)
Hemoglobin: 8 g/dL — ABNORMAL LOW (ref 12.0–15.0)
MCH: 24 pg — ABNORMAL LOW (ref 26.0–34.0)
MCHC: 31.3 g/dL (ref 30.0–36.0)
MCV: 76.6 fL — ABNORMAL LOW (ref 80.0–100.0)
Platelets: 248 10*3/uL (ref 150–400)
RBC: 3.34 MIL/uL — ABNORMAL LOW (ref 3.87–5.11)
RDW: 18.8 % — ABNORMAL HIGH (ref 11.5–15.5)
WBC: 12.1 10*3/uL — ABNORMAL HIGH (ref 4.0–10.5)
nRBC: 0 % (ref 0.0–0.2)

## 2018-08-14 LAB — URINALYSIS, ROUTINE W REFLEX MICROSCOPIC
Bilirubin Urine: NEGATIVE
Glucose, UA: NEGATIVE mg/dL
Hgb urine dipstick: NEGATIVE
Ketones, ur: NEGATIVE mg/dL
Leukocytes, UA: NEGATIVE
Nitrite: NEGATIVE
PROTEIN: NEGATIVE mg/dL
Specific Gravity, Urine: 1.01 (ref 1.005–1.030)
pH: 6 (ref 5.0–8.0)

## 2018-08-14 LAB — BASIC METABOLIC PANEL
Anion gap: 12 (ref 5–15)
BUN: 74 mg/dL — ABNORMAL HIGH (ref 8–23)
CO2: 23 mmol/L (ref 22–32)
Calcium: 8.8 mg/dL — ABNORMAL LOW (ref 8.9–10.3)
Chloride: 108 mmol/L (ref 98–111)
Creatinine, Ser: 3.37 mg/dL — ABNORMAL HIGH (ref 0.44–1.00)
GFR calc Af Amer: 16 mL/min — ABNORMAL LOW (ref >60)
GFR calc non Af Amer: 14 mL/min — ABNORMAL LOW (ref >60)
Glucose, Bld: 147 mg/dL — ABNORMAL HIGH (ref 70–99)
Potassium: 4.5 mmol/L (ref 3.5–5.1)
Sodium: 143 mmol/L (ref 135–145)

## 2018-08-14 LAB — IRON AND TIBC
Iron: 38 ug/dL (ref 28–170)
Saturation Ratios: 9 % — ABNORMAL LOW (ref 10.4–31.8)
TIBC: 420 ug/dL (ref 250–450)
UIBC: 382 ug/dL

## 2018-08-14 LAB — STREP PNEUMONIAE URINARY ANTIGEN: Strep Pneumo Urinary Antigen: NEGATIVE

## 2018-08-14 LAB — TROPONIN I
Troponin I: <0.03 ng/mL (ref <0.03)
Troponin I: <0.03 ng/mL (ref <0.03)

## 2018-08-14 LAB — GLUCOSE, CAPILLARY
Glucose-Capillary: 113 mg/dL — ABNORMAL HIGH (ref 70–99)
Glucose-Capillary: 313 mg/dL — ABNORMAL HIGH (ref 70–99)
Glucose-Capillary: 280 mg/dL — ABNORMAL HIGH (ref 70–99)

## 2018-08-14 LAB — FERRITIN: Ferritin: 12 ng/mL (ref 11–307)

## 2018-08-14 MED ORDER — INSULIN GLARGINE 100 UNIT/ML ~~LOC~~ SOLN
25.0000 [IU] | Freq: Two times a day (BID) | SUBCUTANEOUS | Status: DC
  Administered 2018-08-14: 25 [IU] via SUBCUTANEOUS
  Filled 2018-08-14 (×2): qty 0.25

## 2018-08-14 NOTE — Progress Notes (Signed)
Pt has requested to leave AMA. Provider notified.

## 2018-08-14 NOTE — Progress Notes (Signed)
Pt refusing BG check

## 2018-08-14 NOTE — Plan of Care (Signed)
  Problem: Activity: Goal: Capacity to carry out activities will improve Outcome: Progressing   Problem: Cardiac: Goal: Ability to achieve and maintain adequate cardiopulmonary perfusion will improve Outcome: Progressing   Problem: Education: Goal: Ability to demonstrate management of disease process will improve Outcome: Not Met (add Reason) Goal: Ability to verbalize understanding of medication therapies will improve Outcome: Not Met (add Reason) Goal: Individualized Educational Video(s) Outcome: Not Met (add Reason)

## 2018-08-14 NOTE — Progress Notes (Signed)
Patient is visibly upset regarding that the kitchen keeps bringing fruit cocktail with cherries. 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 Supervisor notified regarding situation. Comment placed regarding patient's food allergies earlier today.

## 2018-08-14 NOTE — Telephone Encounter (Signed)
Called patient's mobile # and message left for cancellation of Previsit and Procedures at the Hospital. Note forwarded to Rosanne Sack RN to cancel hospital cases and arrange 3 month follow up appointment. previsit has been cancelled.

## 2018-08-14 NOTE — Telephone Encounter (Signed)
Yes, cancel previous appointment and hospital procedure slot.  Given these changes, her multiple comorbidities, her previous problems with prep and no-show, she should have an office follow-up appointment with me for reevaluation in 3 months.  Thanks

## 2018-08-14 NOTE — Progress Notes (Signed)
PROGRESS NOTE    Brenda Sims  KWI:[id number] DOB: 05/18/1956 DOA: 08/13/2018 PCP: Charlott Rakes, MD  Brief Narrative: 63 year old female with history of chronic systolic and diastolic CHF, CAD, type 2 diabetes, chronic kidney disease stage IV presented to the emergency room with cough congestion sore throat body aches and fevers associated with some pleuritic chest pain and near syncope.  The emergency room she was noted to have leukocytosis, chest x-ray showed cardiomegaly, and interstitial edema versus atypical pneumonia  Assessment & Plan:  Bronchitis/early atypical pneumonia  -Clinically improving with antibiotics -Continue IV levofloxacin today does not appear fluid overloaded, specially considering infectious symptoms, leukocytosis -Follow-up sputum culture, wean oxygen -Surprisingly influenza PCR and respiratory virus panel was negative  Diabetes mellitus type 2 -Continue Lantus, will increase dose, sliding scale insulin  Chronic systolic and diastolic CHF -Last echocardiogram showed EF of  24% with folic dysfunction -Clinically appears dry to euvolemic, hold Lasix and Aldactone today  Acute kidney injury on chronic kidney disease stage IV -Baseline creatinine is in the 3 range, used to follow with [facility] but has not seen anyone in a number of years -Creatinine was 3.7 on admission, down to 3.4 now, hold diuretics today, clinically appears dry likely restart diuretics in 24 to 48 hours -Advised patient about need to follow-up with nephrology -Hold losartan and discontinue this at discharge  Hyperlipidemia  -continue with Crestor  History of coronary artery disease -; continue with aspirin and beta-blocker  History of anxiety/depression  -Continue BuSpar and Prozac  Chronic pain - continue Neurontin and Flexeril   DVT prophylaxis: Heparin subcutaneous Code Status: Full code Family Communication: No family at bedside Disposition Plan: Home  in 24 to 48 hours pending improvement in respiratory status and kidney function  Consultants:   Nephrology was consulted but better yesterday   Procedures:   Antimicrobials:    Subjective: -Breathing improving, overall feels much better, not back to baseline yet  Objective: Vitals:   08/14/18 0143 08/14/18 0527 08/14/18 0834 08/14/18 1119  BP:  122/65  (!) 103/51  Pulse:  77 77 72  Resp:  18 18 20   Temp:  99.1 F (37.3 C)  98.3 F (36.8 C)  TempSrc:  Oral  Oral  SpO2: 93% 94%  (!) 88%  Weight:      Height:        Intake/Output Summary (Last 24 hours) at 08/14/2018 1217 Last data filed at 08/14/2018 1029 Gross per 24 hour  Intake 726 ml  Output 1400 ml  Net -674 ml   Filed Weights   08/13/18 1600 08/14/18 0053  Weight: 92.3 kg 92.4 kg    Examination:  Gen: Awake, Alert, Oriented X 3, chronically ill-appearing HEENT: PERRLA, Neck supple, no JVD Lungs: Poor air movement bilaterally, no expiratory wheezes, few scattered rhonchi CVS: RRR,No Gallops,Rubs or new Murmurs Abd: soft, Non tender, non distended, BS present Extremities: No edema Skin: no new rashes Psychiatry: Judgement and insight appear normal. Mood & affect appropriate.     Data Reviewed:   CBC: Recent Labs  Lab 08/12/18 2028 08/13/18 1300 08/14/18 0615  WBC 27.2* 19.5* 12.1*  NEUTROABS  --  15.4*  --   HGB 8.9* 8.8* 8.0*  HCT 28.9* 28.9* 25.6*  MCV 77.1* 77.7* 76.6*  PLT 292 291 268   Basic Metabolic Panel: Recent Labs  Lab 08/12/18 2028 08/13/18 1300 08/13/18 1946 08/14/18 0615  NA 136 137  --  143  K 5.8* 5.5* 5.2* 4.5  CL 102  102  --  108  CO2 22 21*  --  23  GLUCOSE 402* 298*  --  147*  BUN 70* 99*  --  74*  CREATININE 3.47* 3.72*  --  3.37*  CALCIUM 9.9 10.0  --  8.8*   GFR: Estimated Creatinine Clearance: 19.3 mL/min (A) (by C-G formula based on SCr of 3.37 mg/dL (H)). Liver Function Tests: Recent Labs  Lab 08/12/18 2028 08/13/18 1300  AST 19 23  ALT 16 16    ALKPHOS 62 54  BILITOT 0.4 0.1*  PROT 7.6 7.5  ALBUMIN 3.6 3.6   No results for input(s): LIPASE, AMYLASE in the last 168 hours. No results for input(s): AMMONIA in the last 168 hours. Coagulation Profile: No results for input(s): INR, PROTIME in the last 168 hours. Cardiac Enzymes: Recent Labs  Lab 08/13/18 1946 08/14/18 0046 08/14/18 0615  TROPONINI <0.03 <0.03 <0.03   BNP (last 3 results) No results for input(s): PROBNP in the last 8760 hours. HbA1C: Recent Labs    08/13/18 1946  HGBA1C 7.7*   CBG: Recent Labs  Lab 08/13/18 1704 08/13/18 2120 08/14/18 0744 08/14/18 1121  GLUCAP 120* 240* 113* 313*   Lipid Profile: No results for input(s): CHOL, HDL, LDLCALC, TRIG, CHOLHDL, LDLDIRECT in the last 72 hours. Thyroid Function Tests: Recent Labs    08/13/18 1946  TSH 4.567*   Anemia Panel: No results for input(s): VITAMINB12, FOLATE, FERRITIN, TIBC, IRON, RETICCTPCT in the last 72 hours. Urine analysis:    Component Value Date/Time   COLORURINE STRAW (A) 08/14/2018 1037   APPEARANCEUR CLEAR 08/14/2018 1037   LABSPEC 1.010 08/14/2018 1037   PHURINE 6.0 08/14/2018 1037   GLUCOSEU NEGATIVE 08/14/2018 1037   HGBUR NEGATIVE 08/14/2018 1037   BILIRUBINUR NEGATIVE 08/14/2018 1037   KETONESUR NEGATIVE 08/14/2018 1037   PROTEINUR NEGATIVE 08/14/2018 1037   NITRITE NEGATIVE 08/14/2018 1037   LEUKOCYTESUR NEGATIVE 08/14/2018 1037   Sepsis Labs: @LABRCNTIP (procalcitonin:4,lacticidven:4)  ) Recent Results (from the past 240 hour(s))  Respiratory Panel by PCR     Status: None   Collection Time: 08/12/18  8:05 PM  Result Value Ref Range Status   Adenovirus NOT DETECTED NOT DETECTED Final   Coronavirus 229E NOT DETECTED NOT DETECTED Final   Coronavirus HKU1 NOT DETECTED NOT DETECTED Final   Coronavirus NL63 NOT DETECTED NOT DETECTED Final   Coronavirus OC43 NOT DETECTED NOT DETECTED Final   Metapneumovirus NOT DETECTED NOT DETECTED Final   Rhinovirus /  Enterovirus NOT DETECTED NOT DETECTED Final   Influenza A NOT DETECTED NOT DETECTED Final   Influenza B NOT DETECTED NOT DETECTED Final   Parainfluenza Virus 1 NOT DETECTED NOT DETECTED Final   Parainfluenza Virus 2 NOT DETECTED NOT DETECTED Final   Parainfluenza Virus 3 NOT DETECTED NOT DETECTED Final   Parainfluenza Virus 4 NOT DETECTED NOT DETECTED Final   Respiratory Syncytial Virus NOT DETECTED NOT DETECTED Final   Bordetella pertussis NOT DETECTED NOT DETECTED Final   Chlamydophila pneumoniae NOT DETECTED NOT DETECTED Final   Mycoplasma pneumoniae NOT DETECTED NOT DETECTED Final    Comment: Performed at [facility], [place] [address] [place], [place] [address]         Radiology Studies: Dg Chest 2 View  Result Date: 08/12/2018 CLINICAL DATA:  Dyspnea EXAM: CHEST - 2 VIEW COMPARISON:  08/11/2018 chest radiograph. FINDINGS: Stable cardiomediastinal silhouette with mild cardiomegaly. No pneumothorax. No pleural effusion. Mild hazy opacities in the parahilar and lower lungs bilaterally, similar. IMPRESSION: Stable mild  cardiomegaly. Stable mild hazy parahilar and lower lung opacities, differential includes mild congestive heart failure or atypical pneumonia. Continued chest radiograph follow-up advised. Electronically Signed   By: Ilona Sorrel M.D.   On: 08/12/2018 19:54   Dg Chest Port 1 View  Result Date: 08/13/2018 CLINICAL DATA:  Pneumonia EXAM: PORTABLE CHEST 1 VIEW COMPARISON:  August 12, 2018 FINDINGS: The mediastinal contour is normal. The heart size is enlarged. Mild hazy increased pulmonary interstitium is identified in both lungs more prominently in both lung bases. There is no pleural effusion. The visualized skeletal structures are stable. IMPRESSION: Mild cardiomegaly. Probable mild interstitial edema unchanged compared prior exam. Electronically Signed   By: Abelardo Diesel M.D.   On: 08/13/2018 13:49        Scheduled Meds: . albuterol  2.5 mg Nebulization Q6H    . amLODipine  10 mg Oral Daily  . aspirin EC  81 mg Oral Daily  . busPIRone  5 mg Oral TID  . carvedilol  25 mg Oral BID WC  . famotidine  10 mg Oral QHS  . FLUoxetine  20 mg Oral Daily  . gabapentin  300 mg Oral TID  . heparin  5,000 Units Subcutaneous Q8H  . insulin aspart  0-5 Units Subcutaneous QHS  . insulin aspart  0-9 Units Subcutaneous TID WC  . insulin glargine  20 Units Subcutaneous BID  . ipratropium  0.5 mg Nebulization Q6H  . loratadine  10 mg Oral Daily  . montelukast  10 mg Oral QHS  . pantoprazole  40 mg Oral Daily  . sodium chloride flush  3 mL Intravenous Q12H  . sodium polystyrene  15 g Oral Once   Continuous Infusions: . sodium chloride    . [START ON 08/15/2018] levofloxacin (LEVAQUIN) IV       LOS: 1 day    Time spent: 20min    Domenic Polite, MD [facility] Page via [email or web address], password [email or web address] After 7PM please contact night-coverage  08/14/2018, 12:17 PM

## 2018-08-14 NOTE — Progress Notes (Signed)
Pt refuses to use CPAP for the night.  

## 2018-08-14 NOTE — Progress Notes (Signed)
Pt refusing her telemetry monitor. Provider and CCMD notified

## 2018-08-14 NOTE — Telephone Encounter (Signed)
Hosp procedure cancelled and recall entered in epic for 3 mth OV.

## 2018-08-14 NOTE — Telephone Encounter (Signed)
Dr Henrene Pastor, On chart review today this patient is currently an In Patient with new dx of pneumonia. Patient is scheduled for a Previsit on 08/19/18 and Hospital procedures on 09/01/18. Should we reschedule this patient.

## 2018-08-14 NOTE — Consult Note (Signed)
[facility] Renal Consultation Note  Requesting MD: Dr. Laren Everts Indication for Consultation: AKI on CKD   HPI: Brenda Sims is a 63 y.o. female with HFpEF (h/o rEF but recently normal), CAD, type 2 DM and CKD who is currently admitted for pneumonia.  Nephrology is consulted re: AKI on CKD.  Sh presented with cough, sore throat, fevers, body aches.  CXR possible atypical PNA versus fluid.  Cr up from recent baseline of 2-2.5 to 3.5.  She was given 3L IV in the ED, admitted and placed on levaquin.  UA bland.  She's had Tm 99.1 here, BPs in 90/40s to low 100s mainly.  UOP after admission yesterday 700, 720m already today.    Admitted 11/14-11/6 with MSK shoulder pain.  She also rec'd IV diuresis for congested CXR.  Cr was in the 2.5-3 range.    She has seen Dr. BCarolin Sicksin clinic last month.   She is aware of her CKD.   PMHx:   Past Medical History:  Diagnosis Date  . Anemia   . Anxiety   . CAD (coronary artery disease)   . CHF (congestive heart failure) ([facility]   . CKD (chronic kidney disease)   . COPD (chronic obstructive pulmonary disease) ([facility]   . Depression   . Diabetes mellitus without complication ([facility]   . Hypertension   . Hypothyroid   . MI (myocardial infarction) ([facility]   . Sleep apnea    no cpap , "never gave me one"    Past Surgical History:  Procedure Laterality Date  . APPENDECTOMY    . BACK SURGERY    . LEFT HEART CATH AND CORONARY ANGIOGRAPHY N/A 09/11/2017   Procedure: LEFT HEART CATH AND CORONARY ANGIOGRAPHY;  Surgeon: JMartinique Peter M, MD;  [facility];  Service: Cardiovascular;  Laterality: N/A;  . TOTAL KNEE ARTHROPLASTY Right 2007    Family Hx:  Family History  Problem Relation Age of Onset  . Hypertension Mother   . Colon cancer Neg Hx   . Esophageal cancer Neg Hx     Social History:  reports that she has never smoked. She has never used smokeless tobacco. She reports that she does not drink alcohol or use  drugs.  Allergies:  Allergies  Allergen Reactions  . Eggs Or Egg-Derived Products Shortness Of Breath and Rash  . Azithromycin Swelling    Facial/throat swelling  . Cherry Swelling    Facial swelling  . Latex Rash    Medications: Prior to Admission medications   Medication Sig Start Date End Date Taking? Authorizing Provider  albuterol (PROVENTIL HFA;VENTOLIN HFA) 108 (90 Base) MCG/ACT inhaler Inhale 1-2 puffs into the lungs every 6 (six) hours as needed for shortness of breath.   Yes [provider]  amLODipine (NORVASC) 10 MG tablet Take 1 tablet (10 mg total) by mouth daily. MUST MAKE APPT FOR FURTHER REFILLS Patient taking differently: Take 10 mg by mouth daily.  06/24/18  Yes NCharlott Rakes MD  aspirin EC 81 MG tablet Take 1 tablet (81 mg total) by mouth daily. 10/16/17  Yes NEna Dawley Tiffany S, PA-C  azithromycin (ZITHROMAX) 250 MG tablet Take 250 mg by mouth daily. Take 500 mg by mouth today, Then take 250 mg tablet daily for 4 days   Yes [provider]  benzonatate (TESSALON) 200 MG capsule Take 200 mg by mouth 3 (three) times daily as needed for cough.   Yes [provider]  busPIRone (BUSPAR) 5 MG tablet Take  1 tablet (5 mg total) by mouth 3 (three) times daily. 01/21/18  Yes Newlin, Charlane Ferretti, MD  carvedilol (COREG) 25 MG tablet TAKE 1 TABLET BY MOUTH 2 (TWO) TIMES DAILY WITH A MEAL. Patient taking differently: Take 25 mg by mouth 2 (two) times daily with a meal.  05/29/18  Yes Newlin, Enobong, MD  cefdinir (OMNICEF) 300 MG capsule Take 300 mg by mouth daily.   Yes [provider]  cetirizine (ZYRTEC) 10 MG tablet TAKE 1 TABLET (10 MG TOTAL) BY MOUTH DAILY. 03/11/18  Yes Charlott Rakes, MD  cyclobenzaprine (FLEXERIL) 10 MG tablet Take 1 tablet (10 mg total) by mouth 3 (three) times daily as needed for muscle spasms. 06/28/18  Yes Purohit, Konrad Dolores, MD  FLUoxetine (PROZAC) 20 MG capsule Take 20 mg by mouth daily. 05/13/18  Yes [provider]   furosemide (LASIX) 40 MG tablet Take 2 tablets (80 mg total) by mouth every morning AND 1 tablet (40 mg total) every evening. Patient taking differently: Take  (80 mg total) by mouth every morning AND  (40 mg total) at 14:00 02/24/18 02/19/19 Yes Croitoru, Mihai, MD  gabapentin (NEURONTIN) 300 MG capsule Take 1 capsule (300 mg total) by mouth 3 (three) times daily. Patient taking differently: Take 800 mg by mouth 3 (three) times daily.  01/21/18  Yes Charlott Rakes, MD  hydrALAZINE (APRESOLINE) 100 MG tablet Take 1 tablet (100 mg total) by mouth 3 (three) times daily. 01/21/18  Yes Charlott Rakes, MD  HYDROcodone-Chlorpheniramine 5-4 MG/5ML SOLN Take 5 mLs by mouth See admin instructions. Take every 24 hours as needed for cough May cause sedation   Yes [provider]  Hyoscyamine Sulfate SL (LEVSIN/SL) 0.125 MG SUBL Place 1 tablet under the tongue every 4 (four) hours as needed. Patient taking differently: Place 1 tablet under the tongue every 4 (four) hours as needed (for abdominal cramping).  05/07/18  Yes Upstill, Nehemiah Settle, PA-C  Insulin Glargine (LANTUS SOLOSTAR) 100 UNIT/ML Solostar Pen Inject 20 Units into the skin 2 (two) times daily. Patient taking differently: Inject 20-30 Units into the skin See admin instructions. Take 30 units in the morning and at noon. Take 20 units with supper 01/21/18  Yes Newlin, Enobong, MD  insulin lispro (HUMALOG KWIKPEN) 100 UNIT/ML KwikPen Inject 4 Units into the skin 3 (three) times daily.   Yes [provider]  losartan (COZAAR) 100 MG tablet Take 100 mg by mouth daily. 05/13/18  Yes [provider]  montelukast (SINGULAIR) 10 MG tablet Take 1 tablet (10 mg total) by mouth at bedtime. 01/21/18  Yes Charlott Rakes, MD  potassium chloride SA (K-DUR,KLOR-CON) 10 MEQ tablet Take 1 tablet (10 mEq total) by mouth daily. 01/21/18  Yes Charlott Rakes, MD  rosuvastatin (CRESTOR) 40 MG tablet Take 1 tablet (40 mg total) by mouth daily. Patient  taking differently: Take 40 mg by mouth at bedtime.  01/21/18 08/14/18 Yes Charlott Rakes, MD  spironolactone (ALDACTONE) 50 MG tablet Take 1 tablet (50 mg total) by mouth daily. 01/21/18  Yes Charlott Rakes, MD  ACCU-CHEK SOFTCLIX LANCETS lancets Use as instructed 10/16/17   Brayton Caves, PA-C  Blood Glucose Monitoring Suppl (ACCU-CHEK AVIVA) device Use as instructed 10/16/17 10/16/18  Brayton Caves, PA-C  Blood Glucose Monitoring Suppl (TRUE METRIX METER) DEVI 1 kit by Does not apply route 4 (four) times daily. 10/16/17   Brayton Caves, PA-C  clotrimazole-betamethasone (LOTRISONE) cream Apply 1 application topically See admin instructions. Apply to affected areas of scalp daily  as directed 05/29/18   [provider]  glucose blood (ACCU-CHEK AVIVA) test strip Use as instructed 10/16/17   Brayton Caves, PA-C  glucose blood (TRUE METRIX BLOOD GLUCOSE TEST) test strip Use as instructed 10/16/17   Brayton Caves, PA-C  Insulin Pen Needle (TRUEPLUS PEN NEEDLES) 31G X 6 MM MISC Use as directed 11/08/17   Charlott Rakes, MD  ketoconazole (NIZORAL) 2 % shampoo Daily for the next week then twice a week until resolution 02/17/18   Augusto Gamble B, NP  pantoprazole (PROTONIX) 40 MG tablet Take 1 tablet (40 mg total) by mouth daily. Take 30-60 min before first meal of the day 07/04/18   Tanda Rockers, MD  TRUEPLUS LANCETS 28G MISC 28 g by Does not apply route 4 (four) times daily. 10/16/17   Brayton Caves, PA-C    I have reviewed the patient's current medications.  Labs:  Results for orders placed or performed during the hospital encounter of 08/13/18 (from the past 48 hour(s))  Comprehensive metabolic panel     Status: Abnormal   Collection Time: 08/13/18  1:00 PM  Result Value Ref Range   Sodium 137 135 - 145 mmol/L   Potassium 5.5 (H) 3.5 - 5.1 mmol/L   Chloride 102 98 - 111 mmol/L   CO2 21 (L) 22 - 32 mmol/L   Glucose, Bld 298 (H) 70 - 99 mg/dL   BUN 99 (H) 8 - 23 mg/dL   Creatinine, Ser  3.72 (H) 0.44 - 1.00 mg/dL   Calcium 10.0 8.9 - 10.3 mg/dL   Total Protein 7.5 6.5 - 8.1 g/dL   Albumin 3.6 3.5 - 5.0 g/dL   AST 23 15 - 41 U/L   ALT 16 0 - 44 U/L   Alkaline Phosphatase 54 38 - 126 U/L   Total Bilirubin 0.1 (L) 0.3 - 1.2 mg/dL   GFR calc non Af Amer 12 (L) >60 mL/min   GFR calc Af Amer 14 (L) >60 mL/min   Anion gap 14 5 - 15    Comment: Performed at [facility], [address] [place], [place] [address]  CBC WITH DIFFERENTIAL     Status: Abnormal   Collection Time: 08/13/18  1:00 PM  Result Value Ref Range   WBC 19.5 (H) 4.0 - 10.5 K/uL   RBC 3.72 (L) 3.87 - 5.11 MIL/uL   Hemoglobin 8.8 (L) 12.0 - 15.0 g/dL    Comment: Reticulocyte Hemoglobin testing may be clinically indicated, consider ordering this additional test [id number]    HCT 28.9 (L) 36.0 - 46.0 %   MCV 77.7 (L) 80.0 - 100.0 fL   MCH 23.7 (L) 26.0 - 34.0 pg   MCHC 30.4 30.0 - 36.0 g/dL   RDW 18.8 (H) 11.5 - 15.5 %   Platelets 291 150 - 400 K/uL   nRBC 0.0 0.0 - 0.2 %   Neutrophils Relative % 79 %   Neutro Abs 15.4 (H) 1.7 - 7.7 K/uL   Lymphocytes Relative 12 %   Lymphs Abs 2.4 0.7 - 4.0 K/uL   Monocytes Relative 8 %   Monocytes Absolute 1.5 (H) 0.1 - 1.0 K/uL   Eosinophils Relative 0 %   Eosinophils Absolute 0.1 0.0 - 0.5 K/uL   Basophils Relative 0 %   Basophils Absolute 0.1 0.0 - 0.1 K/uL   Immature Granulocytes 1 %   Abs Immature Granulocytes 0.10 (H) 0.00 - 0.07 K/uL    Comment: Performed at [facility],  [address] [place], [place] [address]  I-Stat CG4 Lactic Acid, ED  (not at  [facility])     Status: None   Collection Time: 08/13/18  1:24 PM  Result Value Ref Range   Lactic Acid, Venous 1.41 0.5 - 1.9 mmol/L  Glucose, capillary     Status: Abnormal   Collection Time: 08/13/18  5:04 PM  Result Value Ref Range   Glucose-Capillary 120 (H) 70 - 99 mg/dL   Comment 1 Notify RN   Strep pneumoniae urinary antigen     Status: None   Collection Time: 08/13/18  7:06 PM  Result  Value Ref Range   Strep Pneumo Urinary Antigen NEGATIVE NEGATIVE    Comment:        Infection due to S. pneumoniae cannot be absolutely ruled out since the antigen present may be below the detection limit of the test. Performed at [facility], [place] [address] [place], [place] [address]   TSH     Status: Abnormal   Collection Time: 08/13/18  7:46 PM  Result Value Ref Range   TSH 4.567 (H) 0.350 - 4.500 uIU/mL    Comment: Performed by a 3rd Generation assay with a functional sensitivity of <=0.01 uIU/mL. Performed at [facility], [place] [address] [place], [place] [address]   Troponin I - Now Then Q6H     Status: None   Collection Time: 08/13/18  7:46 PM  Result Value Ref Range   Troponin I <0.03 <0.03 ng/mL    Comment: Performed at [facility] [address] [place], [place] [address]  Hemoglobin A1c     Status: Abnormal   Collection Time: 08/13/18  7:46 PM  Result Value Ref Range   Hgb A1c MFr Bld 7.7 (H) 4.8 - 5.6 %    Comment: (NOTE) Pre diabetes:          5.7%-6.4% Diabetes:              >6.4% Glycemic control for   <7.0% adults with diabetes    Mean Plasma Glucose 174.29 mg/dL    Comment: Performed at [facility] [address], [place], [place] [address]  Potassium     Status: Abnormal   Collection Time: 08/13/18  7:46 PM  Result Value Ref Range   Potassium 5.2 (H) 3.5 - 5.1 mmol/L    Comment: Performed at [facility], [place] [address], [place], [place] [address]  Glucose, capillary     Status: Abnormal   Collection Time: 08/13/18  9:20 PM  Result Value Ref Range   Glucose-Capillary 240 (H) 70 - 99 mg/dL  Troponin I - Now Then Q6H     Status: None   Collection Time: 08/14/18 12:46 AM  Result Value Ref Range   Troponin I <0.03 <0.03 ng/mL    Comment: Performed at [facility] [address], [place], [place] [address]  Troponin I - Now Then Q6H     Status: None   Collection Time: 08/14/18  6:15 AM  Result Value Ref Range    Troponin I <0.03 <0.03 ng/mL    Comment: Performed at [facility] [address], [place], [place] [address]  Basic metabolic panel     Status: Abnormal   Collection Time: 08/14/18  6:15 AM  Result Value Ref Range   Sodium 143 135 - 145 mmol/L   Potassium 4.5 3.5 - 5.1 mmol/L   Chloride 108 98 -  111 mmol/L   CO2 23 22 - 32 mmol/L   Glucose, Bld 147 (H) 70 - 99 mg/dL   BUN 74 (H) 8 - 23 mg/dL   Creatinine, Ser 3.37 (H) 0.44 - 1.00 mg/dL   Calcium 8.8 (L) 8.9 - 10.3 mg/dL   GFR calc non Af Amer 14 (L) >60 mL/min   GFR calc Af Amer 16 (L) >60 mL/min   Anion gap 12 5 - 15    Comment: Performed at [facility] [address], [place], [place] [address]  CBC     Status: Abnormal   Collection Time: 08/14/18  6:15 AM  Result Value Ref Range   WBC 12.1 (H) 4.0 - 10.5 K/uL   RBC 3.34 (L) 3.87 - 5.11 MIL/uL   Hemoglobin 8.0 (L) 12.0 - 15.0 g/dL    Comment: Reticulocyte Hemoglobin testing may be clinically indicated, consider ordering this additional test [id number]    HCT 25.6 (L) 36.0 - 46.0 %   MCV 76.6 (L) 80.0 - 100.0 fL   MCH 24.0 (L) 26.0 - 34.0 pg   MCHC 31.3 30.0 - 36.0 g/dL   RDW 18.8 (H) 11.5 - 15.5 %   Platelets 248 150 - 400 K/uL   nRBC 0.0 0.0 - 0.2 %    Comment: Performed at [facility], [place] [address] [place], [place] [address]  Glucose, capillary     Status: Abnormal   Collection Time: 08/14/18  7:44 AM  Result Value Ref Range   Glucose-Capillary 113 (H) 70 - 99 mg/dL  Urinalysis, Routine w reflex microscopic (not at [facility])     Status: Abnormal   Collection Time: 08/14/18 10:37 AM  Result Value Ref Range   Color, Urine STRAW (A) YELLOW   APPearance CLEAR CLEAR   Specific Gravity, Urine 1.010 1.005 - 1.030   pH 6.0 5.0 - 8.0   Glucose, UA NEGATIVE NEGATIVE mg/dL   Hgb urine dipstick NEGATIVE NEGATIVE   Bilirubin Urine NEGATIVE NEGATIVE   Ketones, ur NEGATIVE NEGATIVE mg/dL   Protein, ur NEGATIVE NEGATIVE mg/dL   Nitrite NEGATIVE NEGATIVE    Leukocytes, UA NEGATIVE NEGATIVE    Comment: Performed at [facility] [address], [place], [place] [address]  Glucose, capillary     Status: Abnormal   Collection Time: 08/14/18 11:21 AM  Result Value Ref Range   Glucose-Capillary 313 (H) 70 - 99 mg/dL     ROS:  Pertinent items are noted in HPI.  Physical Exam: Vitals:   08/14/18 0834 08/14/18 1119  BP:  (!) 103/51  Pulse: 77 72  Resp: 18 20  Temp:  98.3 F (36.8 C)  SpO2:  (!) 88%     General: overweight woman sitting comfortably in bed HEENT: MMM Eyes: anicteric Neck: supple, unable to appreciate JVD Heart: RRR. No rub or S4 Lungs: normal WOB, a few scattered wheezes and rhonchi Abdomen: soft, obese Extremities: no edema Skin: warm and dry Neuro: nonfocal  Assessment/Plan: **AKI on CKD: Baseline renal function appear to be in mid 1s through 2019 but she's had some AKI earlier in year too with Cr into low 2s.  She was admitted in 06/2018 at which time Cr was 2.5 -3.  On presentation currently Cr 3.5 - 3.7.  Renal US 08/2017 with small atrophic R kidney, L ok.  I don't see a reason to repeat the ultrasound.  Her BPs have been running low and I think that is a large reason for her  AKI. --Holding antiHTNs, see below --Daily BMP --Strict I/Os and daily weights --Will make sure f/u in clinic is arranged prior to discharge  **HTN:  BP dipping into the 90-100s.  She has already rec'd amlodipine 10 this AM so I've D/C hydralazine 100 TID.  She is also on coreg 25 BID.  Losartan 100 on hold as is lasix 80qam/40qpm.  She will likely need to stay off losartan at discharge.  **dCHF:  Appears fairly euvolemic.  CXR does not appear pulmonary edema and her constellation of symptoms seem more consistent with infectious etiology.  --Hold furosemide today but low threshold to resume tomorrow  **pulmonary infection: atypical PNA. Improving with levaquin.  **Anemia: microcytic.  Had iron defiency in 08/2017.  Recheck iron  indices. (added on)  Justin Mend 08/14/2018, 11:57 AM

## 2018-08-14 NOTE — Progress Notes (Signed)
Spoke with provider. Pt is now saying she spoke with her lawyer and he told her to stay. So pt now staying in hospital.

## 2018-08-15 ENCOUNTER — Encounter (HOSPITAL_COMMUNITY): Payer: Self-pay | Admitting: *Deleted

## 2018-08-15 ENCOUNTER — Emergency Department (HOSPITAL_COMMUNITY): Payer: Medicaid Other

## 2018-08-15 ENCOUNTER — Emergency Department (HOSPITAL_COMMUNITY)
Admission: EM | Admit: 2018-08-15 | Discharge: 2018-08-15 | Disposition: A | Discharge status: Home / Self Care | Payer: Medicaid Other | Attending: Emergency Medicine | Admitting: Emergency Medicine

## 2018-08-15 ENCOUNTER — Other Ambulatory Visit: Payer: Self-pay

## 2018-08-15 DIAGNOSIS — Z79899 Other long term (current) drug therapy: Secondary | ICD-10-CM | POA: Insufficient documentation

## 2018-08-15 DIAGNOSIS — Z91040 Latex allergy status: Secondary | ICD-10-CM | POA: Insufficient documentation

## 2018-08-15 DIAGNOSIS — I130 Hypertensive heart and chronic kidney disease with heart failure and stage 1 through stage 4 chronic kidney disease, or unspecified chronic kidney disease: Secondary | ICD-10-CM | POA: Insufficient documentation

## 2018-08-15 DIAGNOSIS — N184 Chronic kidney disease, stage 4 (severe): Secondary | ICD-10-CM | POA: Insufficient documentation

## 2018-08-15 DIAGNOSIS — J441 Chronic obstructive pulmonary disease with (acute) exacerbation: Secondary | ICD-10-CM | POA: Insufficient documentation

## 2018-08-15 DIAGNOSIS — R5381 Other malaise: Secondary | ICD-10-CM | POA: Diagnosis not present

## 2018-08-15 DIAGNOSIS — I5032 Chronic diastolic (congestive) heart failure: Secondary | ICD-10-CM | POA: Insufficient documentation

## 2018-08-15 LAB — BASIC METABOLIC PANEL
Anion gap: 12 (ref 5–15)
BUN: 67 mg/dL — ABNORMAL HIGH (ref 8–23)
CHLORIDE: 106 mmol/L (ref 98–111)
CO2: 25 mmol/L (ref 22–32)
Calcium: 9.3 mg/dL (ref 8.9–10.3)
Creatinine, Ser: 2.84 mg/dL — ABNORMAL HIGH (ref 0.44–1.00)
GFR calc non Af Amer: 17 mL/min — ABNORMAL LOW (ref >60)
GFR, EST AFRICAN AMERICAN: 20 mL/min — AB (ref >60)
Glucose, Bld: 164 mg/dL — ABNORMAL HIGH (ref 70–99)
Potassium: 4.5 mmol/L (ref 3.5–5.1)
Sodium: 143 mmol/L (ref 135–145)

## 2018-08-15 LAB — CBC WITH DIFFERENTIAL/PLATELET
Abs Immature Granulocytes: 0.08 10*3/uL — ABNORMAL HIGH (ref 0.00–0.07)
Basophils Absolute: 0.1 10*3/uL (ref 0.0–0.1)
Basophils Relative: 1 %
Eosinophils Absolute: 0.6 10*3/uL — ABNORMAL HIGH (ref 0.0–0.5)
Eosinophils Relative: 4 %
HCT: 28.7 % — ABNORMAL LOW (ref 36.0–46.0)
HEMOGLOBIN: 8.9 g/dL — AB (ref 12.0–15.0)
Immature Granulocytes: 1 %
Lymphocytes Relative: 11 %
Lymphs Abs: 1.7 10*3/uL (ref 0.7–4.0)
MCH: 24.7 pg — ABNORMAL LOW (ref 26.0–34.0)
MCHC: 31 g/dL (ref 30.0–36.0)
MCV: 79.7 fL — ABNORMAL LOW (ref 80.0–100.0)
Monocytes Absolute: 1.8 10*3/uL — ABNORMAL HIGH (ref 0.1–1.0)
Monocytes Relative: 11 %
NEUTROS ABS: 11.2 10*3/uL — AB (ref 1.7–7.7)
NEUTROS PCT: 72 %
Platelets: 267 10*3/uL (ref 150–400)
RBC: 3.6 MIL/uL — ABNORMAL LOW (ref 3.87–5.11)
RDW: 19.2 % — ABNORMAL HIGH (ref 11.5–15.5)
WBC: 15.4 10*3/uL — ABNORMAL HIGH (ref 4.0–10.5)
nRBC: 0 % (ref 0.0–0.2)

## 2018-08-15 LAB — URINE CULTURE: Culture: NO GROWTH

## 2018-08-15 LAB — LEGIONELLA PNEUMOPHILA SEROGP 1 UR AG: L. pneumophila Serogp 1 Ur Ag: NEGATIVE

## 2018-08-15 NOTE — ED Provider Notes (Signed)
[facility] DEPT Provider Note   CSN: [id number] Arrival date & time: 08/15/18  0603     History   Chief Complaint Chief Complaint  Patient presents with  . Pneumonia    HPI Brenda Sims is a 63 y.o. female.  HPI   Patient apparently presented for evaluation of trouble breathing.  She left AMA from [facility] just prior to arrival here.  Patient is sad because she was dropped off here in [place] from a house where she was staying with someone in clinics, [place].  I saw her 2 days ago when she was ill, and admitted.  At that time she was being treated for pneumonia with possible fluid overload.  Currently patient states she feels cold but denies chills, shortness of breath, chest pain, weakness or dizziness.  There are no other known modifying factors.  Past Medical History:  Diagnosis Date  . Anemia   . Anxiety   . CAD (coronary artery disease)   . CHF (congestive heart failure) ([place])   . CKD (chronic kidney disease)   . COPD (chronic obstructive pulmonary disease) ([place])   . Depression   . Diabetes mellitus without complication ([place])   . Hypertension   . Hypothyroid   . MI (myocardial infarction) ([place])   . Sleep apnea    no cpap , "never gave me one"    Patient Active Problem List   Diagnosis Date Noted  . Community acquired pneumonia 08/13/2018  . Upper airway cough syndrome 07/04/2018  . CHF exacerbation ([place]) 06/27/2018  . Left shoulder pain 06/27/2018  . COPD exacerbation ([place]) 03/05/2018  . CAD (coronary artery disease) 03/05/2018  . Iron deficiency anemia 02/26/2018  . Sleep apnea 11/01/2017  . HLD (hyperlipidemia) 10/19/2017  . Anxiety and depression 10/19/2017  . Chest pain 10/19/2017  . Hypoglycemia   . Hypoxia   . Bronchitis 10/07/2017  . Chronic diastolic heart failure ([place]) 10/07/2017  . Acute renal failure superimposed on stage 3 chronic kidney disease ([place]) 10/07/2017  . Hypokalemia 10/07/2017  .  Normocytic normochromic anemia 10/07/2017  . Type II diabetes mellitus with renal manifestations ([place])   . Leukocytosis   . Acute blood loss anemia   . Seizures ([place])   . Coronary artery disease involving native coronary artery of native heart with angina pectoris ([place]) 09/05/2017  . History of cardiac arrest 09/03/2017  . Cardiopulmonary arrest ([place])   . Acute encephalopathy   . Acute respiratory failure with hypoxia ([place])   . Acute on chronic combined systolic and diastolic CHF (congestive heart failure) ([place]) 08/29/2017  . Essential hypertension 08/29/2017  . Type 1 diabetes mellitus without complication ([place]) 16/94/5038  . Tobacco abuse 08/29/2017  . Acute pulmonary edema (HCC)   . CKD (chronic kidney disease) stage 4, GFR 15-29 ml/min (HCC)   . Dyspnea on exertion     Past Surgical History:  Procedure Laterality Date  . APPENDECTOMY    . BACK SURGERY    . LEFT HEART CATH AND CORONARY ANGIOGRAPHY N/A 09/11/2017   Procedure: LEFT HEART CATH AND CORONARY ANGIOGRAPHY;  Surgeon: Martinique, Peter M, MD;  Location: [facility];  Service: Cardiovascular;  Laterality: N/A;  . TOTAL KNEE ARTHROPLASTY Right 2007     OB History   No obstetric history on file.      Home Medications    Prior to Admission medications   Medication Sig Start Date End Date Taking? Authorizing Provider  ACCU-CHEK SOFTCLIX LANCETS lancets Use  as instructed 10/16/17   Brayton Caves, PA-C  albuterol (PROVENTIL HFA;VENTOLIN HFA) 108 (90 Base) MCG/ACT inhaler Inhale 1-2 puffs into the lungs every 6 (six) hours as needed for shortness of breath.    [provider]  amLODipine (NORVASC) 10 MG tablet Take 1 tablet (10 mg total) by mouth daily. MUST MAKE APPT FOR FURTHER REFILLS Patient taking differently: Take 10 mg by mouth daily.  06/24/18   Charlott Rakes, MD  aspirin EC 81 MG tablet Take 1 tablet (81 mg total) by mouth daily. 10/16/17   Brayton Caves, PA-C  azithromycin (ZITHROMAX) 250 MG tablet  Take 250 mg by mouth daily. Take 500 mg by mouth today, Then take 250 mg tablet daily for 4 days    [provider]  benzonatate (TESSALON) 200 MG capsule Take 200 mg by mouth 3 (three) times daily as needed for cough.    [provider]  Blood Glucose Monitoring Suppl (ACCU-CHEK AVIVA) device Use as instructed 10/16/17 10/16/18  Brayton Caves, PA-C  Blood Glucose Monitoring Suppl (TRUE METRIX METER) DEVI 1 kit by Does not apply route 4 (four) times daily. 10/16/17   Brayton Caves, PA-C  busPIRone (BUSPAR) 5 MG tablet Take 1 tablet (5 mg total) by mouth 3 (three) times daily. 01/21/18   Charlott Rakes, MD  carvedilol (COREG) 25 MG tablet TAKE 1 TABLET BY MOUTH 2 (TWO) TIMES DAILY WITH A MEAL. Patient taking differently: Take 25 mg by mouth 2 (two) times daily with a meal.  05/29/18   Charlott Rakes, MD  cefdinir (OMNICEF) 300 MG capsule Take 300 mg by mouth daily.    [provider]  cetirizine (ZYRTEC) 10 MG tablet TAKE 1 TABLET (10 MG TOTAL) BY MOUTH DAILY. 03/11/18   Charlott Rakes, MD  clotrimazole-betamethasone (LOTRISONE) cream Apply 1 application topically See admin instructions. Apply to affected areas of scalp daily as directed 05/29/18   [provider]  cyclobenzaprine (FLEXERIL) 10 MG tablet Take 1 tablet (10 mg total) by mouth 3 (three) times daily as needed for muscle spasms. 06/28/18   Purohit, Konrad Dolores, MD  FLUoxetine (PROZAC) 20 MG capsule Take 20 mg by mouth daily. 05/13/18   [provider]  furosemide (LASIX) 40 MG tablet Take 2 tablets (80 mg total) by mouth every morning AND 1 tablet (40 mg total) every evening. Patient taking differently: Take  (80 mg total) by mouth every morning AND  (40 mg total) at 14:00 02/24/18 02/19/19  Croitoru, Mihai, MD  gabapentin (NEURONTIN) 300 MG capsule Take 1 capsule (300 mg total) by mouth 3 (three) times daily. Patient taking differently: Take 800 mg by mouth 3 (three) times daily.  01/21/18   Charlott Rakes, MD  glucose blood (ACCU-CHEK AVIVA) test strip Use as instructed 10/16/17   Brayton Caves, PA-C  glucose blood (TRUE METRIX BLOOD GLUCOSE TEST) test strip Use as instructed 10/16/17   Brayton Caves, PA-C  hydrALAZINE (APRESOLINE) 100 MG tablet Take 1 tablet (100 mg total) by mouth 3 (three) times daily. 01/21/18   Charlott Rakes, MD  HYDROcodone-Chlorpheniramine 5-4 MG/5ML SOLN Take 5 mLs by mouth See admin instructions. Take every 24 hours as needed for cough May cause sedation    [provider]  Hyoscyamine Sulfate SL (LEVSIN/SL) 0.125 MG SUBL Place 1 tablet under the tongue every 4 (four) hours as needed. Patient taking differently: Place 1 tablet under the tongue every 4 (four) hours as needed (for abdominal cramping).  05/07/18  Charlann Lange, PA-C  Insulin Glargine (LANTUS SOLOSTAR) 100 UNIT/ML Solostar Pen Inject 20 Units into the skin 2 (two) times daily. Patient taking differently: Inject 20-30 Units into the skin See admin instructions. Take 30 units in the morning and at noon. Take 20 units with supper 01/21/18   Charlott Rakes, MD  insulin lispro (HUMALOG KWIKPEN) 100 UNIT/ML KwikPen Inject 4 Units into the skin 3 (three) times daily.    [provider]  Insulin Pen Needle (TRUEPLUS PEN NEEDLES) 31G X 6 MM MISC Use as directed 11/08/17   Charlott Rakes, MD  ketoconazole (NIZORAL) 2 % shampoo Daily for the next week then twice a week until resolution Patient taking differently: Apply 1 application topically 2 (two) times a week. until resolution 02/17/18   Zigmund Gottron, NP  losartan (COZAAR) 100 MG tablet Take 100 mg by mouth daily. 05/13/18   [provider]  montelukast (SINGULAIR) 10 MG tablet Take 1 tablet (10 mg total) by mouth at bedtime. 01/21/18   Charlott Rakes, MD  potassium chloride SA (K-DUR,KLOR-CON) 10 MEQ tablet Take 1 tablet (10 mEq total) by mouth daily. 01/21/18   Charlott Rakes, MD  rosuvastatin (CRESTOR) 40 MG tablet Take 1 tablet  (40 mg total) by mouth daily. Patient taking differently: Take 40 mg by mouth at bedtime.  01/21/18 08/14/18  Charlott Rakes, MD  spironolactone (ALDACTONE) 50 MG tablet Take 1 tablet (50 mg total) by mouth daily. 01/21/18   Charlott Rakes, MD  TRUEPLUS LANCETS 28G MISC 28 g by Does not apply route 4 (four) times daily. 10/16/17   Brayton Caves, PA-C    Family History Family History  Problem Relation Age of Onset  . Hypertension Mother   . Colon cancer Neg Hx   . Esophageal cancer Neg Hx     Social History Social History   Tobacco Use  . Smoking status: Never Smoker  . Smokeless tobacco: Never Used  Substance Use Topics  . Alcohol use: No    Frequency: Never  . Drug use: No     Allergies   Eggs or egg-derived products; Azithromycin; Cherry; and Latex   Review of Systems Review of Systems  All other systems reviewed and are negative.    Physical Exam Updated Vital Signs BP (!) 142/72 (BP Location: Left Arm)   Pulse 98   Temp 98.9 F (37.2 C) (Oral)   Ht 5' 4.5" (1.638 m)   Wt 91.9 kg   SpO2 95%   BMI 34.24 kg/m   Physical Exam Vitals signs and nursing note reviewed.  Constitutional:      Appearance: She is well-developed.  HENT:     Head: Normocephalic and atraumatic.     Right Ear: External ear normal.     Left Ear: External ear normal.  Eyes:     Conjunctiva/sclera: Conjunctivae normal.     Pupils: Pupils are equal, round, and reactive to light.  Neck:     Musculoskeletal: Normal range of motion and neck supple.     Trachea: Phonation normal.  Cardiovascular:     Rate and Rhythm: Normal rate.  Pulmonary:     Effort: Pulmonary effort is normal.  Musculoskeletal: Normal range of motion.     Comments: Normal gait  Skin:    General: Skin is warm and dry.  Neurological:     Mental Status: She is alert and oriented to person, place, and time.     Cranial Nerves: No cranial nerve deficit.  Sensory: No sensory deficit.     Motor: No abnormal muscle  tone.     Coordination: Coordination normal.  Psychiatric:        Behavior: Behavior normal.        Thought Content: Thought content normal.        Judgment: Judgment normal.     Comments: Sad, tearful      ED Treatments / Results  Labs (all labs ordered are listed, but only abnormal results are displayed) Labs Reviewed  CBC WITH DIFFERENTIAL/PLATELET - Abnormal; Notable for the following components:      Result Value   WBC 15.4 (*)    RBC 3.60 (*)    Hemoglobin 8.9 (*)    HCT 28.7 (*)    MCV 79.7 (*)    MCH 24.7 (*)    RDW 19.2 (*)    Neutro Abs 11.2 (*)    Monocytes Absolute 1.8 (*)    Eosinophils Absolute 0.6 (*)    Abs Immature Granulocytes 0.08 (*)    All other components within normal limits  BASIC METABOLIC PANEL - Abnormal; Notable for the following components:   Glucose, Bld 164 (*)    BUN 67 (*)    Creatinine, Ser 2.84 (*)    GFR calc non Af Amer 17 (*)    GFR calc Af Amer 20 (*)    All other components within normal limits    EKG EKG Interpretation  Date/Time:  Friday August 15 2018 07:52:30 EST Ventricular Rate:  83 PR Interval:    QRS Duration: 101 QT Interval:  381 QTC Calculation: 448 R Axis:   -60 Text Interpretation:  Sinus rhythm Left anterior fascicular block Abnormal R-wave progression, late transition Borderline T abnormalities, lateral leads since last tracing no significant change Confirmed by Daleen Bo [phone]) on 08/15/2018 8:06:43 AM   Radiology Dg Chest 2 View  Result Date: 08/15/2018 CLINICAL DATA:  Cough, shortness of breath. EXAM: CHEST - 2 VIEW COMPARISON:  None. FINDINGS: The heart size and mediastinal contours are within normal limits. Both lungs are clear. Atherosclerosis of thoracic aorta is noted. No pneumothorax or pleural effusion is noted. The visualized skeletal structures are unremarkable. IMPRESSION: No active cardiopulmonary disease. Aortic Atherosclerosis (ICD10-I70.0). Electronically Signed   By: Marijo Conception,  M.D.   On: 08/15/2018 07:19   Dg Chest Port 1 View  Result Date: 08/13/2018 CLINICAL DATA:  Pneumonia EXAM: PORTABLE CHEST 1 VIEW COMPARISON:  August 12, 2018 FINDINGS: The mediastinal contour is normal. The heart size is enlarged. Mild hazy increased pulmonary interstitium is identified in both lungs more prominently in both lung bases. There is no pleural effusion. The visualized skeletal structures are stable. IMPRESSION: Mild cardiomegaly. Probable mild interstitial edema unchanged compared prior exam. Electronically Signed   By: Abelardo Diesel M.D.   On: 08/13/2018 13:49    Procedures Procedures (including critical care time)  Medications Ordered in ED Medications - No data to display   Initial Impression / Assessment and Plan / ED Course  I have reviewed the triage vital signs and the nursing notes.  Pertinent labs & imaging results that were available during my care of the patient were reviewed by me and considered in my medical decision making (see chart for details).  Clinical Course as of Aug 16 955  Fri Aug 15, 2018  0370 Normal except glucose high, BUN high, creatinine high, GFR low  Basic metabolic panel(!) [EW]  4888 Normal except white count high, hemoglobin low, MCV low  CBC with Differential(!) [EW]  0940 No infiltrate or CHF, images reviewed by me  DG Chest 2 View [EW]    Clinical Course User Index [EW] Daleen Bo, MD     Patient Vitals for the past 24 hrs:  BP Temp Temp src Pulse SpO2 Height Weight  08/15/18 0620 - - - - - 5' 4.5" (1.638 m) 91.9 kg  08/15/18 0615 (!) 142/72 98.9 F (37.2 C) Oral 98 95 % - -    9:54 AM Reevaluation with update and discussion. After initial assessment and treatment, an updated evaluation reveals no change in clinical status, findings discussed with the patient and all questions were answered. Daleen Bo   Medical Decision Making: [place] Rathke, with recent hospitalization, from which she left AMA.  Screening evaluation  today is reassuring.  No evidence for aspiratory stress, pneumonia, CHF or metabolic problems.  Patient with apparent homelessness, versus being displaced in an unfamiliar town.  She is distressed, sad over her current dilemma.  Social services has been consulted.  CRITICAL CARE-no Performed by: Daleen Bo   Nursing Notes Reviewed/ Care Coordinated Applicable Imaging Reviewed Interpretation of Laboratory Data incorporated into ED treatment  The patient appears reasonably screened and/or stabilized for discharge and I doubt any other medical condition or other [facility] requiring further screening, evaluation, or treatment in the ED at this time prior to discharge.  Plan: Home Medications-continue usual medications; Home Treatments-rest, regular diet for diabetes; return here if the recommended treatment, does not improve the symptoms; Recommended follow up-PCP checkup 1 week and as needed     Final Clinical Impressions(s) / ED Diagnoses   Final diagnoses:  [facility]    ED Discharge Orders    None       Daleen Bo, MD 08/15/18 [phone]

## 2018-08-15 NOTE — Progress Notes (Signed)
Pt wants to leave AMA but refuses to sign papers. Pt states "my lawyer dont want me to sign no papers. So take this IV out now and I am leaving". Rn educated patient on the discharge process. Pt refuses to listen. Security called and MD notified.

## 2018-08-15 NOTE — ED Triage Notes (Signed)
Pt stated "I was @ [facility] but they tried to get me to sign papers saying I was leaving but I wasn't going to sign anything.  They told me I have CHF and pneumonia."

## 2018-08-15 NOTE — Progress Notes (Signed)
CSW consulted due to patient being displaced and not having anywhere to go. CSW spoke with patient at bedside who reports she has lived here in [place] for the last year. Patient states she would like to just have her own place to live. CSW discussed housing programs throughout [place] that could assist patient with finding a place to live. CSW also provided patient with a list of homeless shelter resources, [facility] information and places patient could go for a warm meal. Patient denied needing any further resources. Please reconsult if needs arise.  Ollen Barges, [place] Work Department  [employer]  [phone]

## 2018-08-15 NOTE — Progress Notes (Signed)
Pt requested again to sign AMA form. Pt still refusing.   MD orders: let her leave

## 2018-08-15 NOTE — ED Notes (Signed)
2 unsuccessful attempts to draw blood, hospital phlebotomy called.

## 2018-08-15 NOTE — Progress Notes (Signed)
Pt requests her IV taken out. Patient states "if you dont take it out I will take it out myself"

## 2018-08-15 NOTE — Discharge Instructions (Addendum)
Take all of your medications as prescribed.  Stay on a low carbohydrate diet.  See your doctor for further care as soon as possible.

## 2018-08-15 NOTE — Progress Notes (Addendum)
Pt has left the unit AMA escorted by security. Md notified.

## 2018-08-17 NOTE — Discharge Summary (Signed)
Physician Discharge Summary  Brenda Sims [id number] DOB: 10-08-1955 DOA: 08/13/2018  PCP: Charlott Rakes, MD  Admit date: 08/13/2018 Discharge date: 08/14/2018  Time spent: 0 minutes  Recommendations for Outpatient Follow-up:  LEFT AMA  Discharge Diagnoses:  Active Problems:   Bronchitis/early pneumonia   DM type 2   Chronic systolic and diastolic CHF Acute kidney injury and chronic kidney disease stage IV History of CAD Anxiety Depression Chronic pain syndrome  Discharge Condition:   Diet recommendation:   Filed Weights   08/13/18 1600 08/14/18 0053 08/15/18 0001  Weight: 92.3 kg 92.4 kg 91.9 kg    History of present illness:   63 year old female with history of chronic systolic and diastolic CHF, CAD, type 2 diabetes, chronic kidney disease stage IV presented to the emergency room with cough congestion sore throat body aches and fevers associated with some pleuritic chest pain and near syncope.  The emergency room she was noted to have leukocytosis, chest x-ray showed cardiomegaly, and interstitial edema versus atypical pneumonia   Hospital Course:   Bronchitis/early atypical pneumonia  -Clinically improving with antibiotics -Surprisingly influenza PCR and respiratory virus panel was negative -was being Rx with IV Abx levofloxacin -Per staff note patient was very upset about kitchen bringing her food cocktails and cherries despite patient being allergic to cherries, Subsequently left AGAINST MEDICAL ADVICE  Diabetes mellitus type 2 -Treated with Lantus and sliding scale  Chronic systolic and diastolic CHF -Last echocardiogram showed EF of  48% with diastolic dysfunction -Likely appeared dry on admission hence diuretics were on hold  Acute kidney injury on chronic kidney disease stage IV -Baseline creatinine is in the 3 range, used to follow with [facility] but has not seen anyone in a number of years -Creatinine was 3.7 on admission, down to  3.4 now, held diuretics today, clinically appeared dry, our plan was to restart diuretics in 24 to 48 hours -Advised patient about need to follow-up with nephrology -We also discontinued losartan   Hyperlipidemia  -continue with Crestor  History of coronary artery disease -continue with aspirin and beta-blocker  History of anxiety/depression  -Continue BuSpar and Prozac  Chronic pain - continue Neurontin and Flexeril   Consultations:  Renal  Discharge Exam: Vitals:   08/15/18 0001 08/15/18 0336  BP: 135/82 131/77  Pulse: 82 86  Resp: 18 18  Temp: 98 F (36.7 C) 98.3 F (36.8 C)  SpO2: 100% 95%    Exam: Pt was not seen at the time of leaving [facility]  Discharge Instructions    Allergies as of 08/15/2018      Reactions   Eggs Or Egg-derived Products Shortness Of Breath, Rash   Azithromycin Swelling   Facial/throat swelling   Cherry Swelling   Facial swelling   Latex Rash      Medication List    ASK your doctor about these medications   albuterol 108 (90 Base) MCG/ACT inhaler Commonly known as:  PROVENTIL HFA;VENTOLIN HFA Inhale 1-2 puffs into the lungs every 6 (six) hours as needed for shortness of breath.   amLODipine 10 MG tablet Commonly known as:  NORVASC Take 1 tablet (10 mg total) by mouth daily. MUST MAKE APPT FOR FURTHER REFILLS   aspirin EC 81 MG tablet Take 1 tablet (81 mg total) by mouth daily.   azithromycin 250 MG tablet Commonly known as:  ZITHROMAX Take 250 mg by mouth daily. Take 500 mg by mouth today, Then take 250 mg tablet daily for 4 days   benzonatate  200 MG capsule Commonly known as:  TESSALON Take 200 mg by mouth 3 (three) times daily as needed for cough.   busPIRone 5 MG tablet Commonly known as:  BUSPAR Take 1 tablet (5 mg total) by mouth 3 (three) times daily.   carvedilol 25 MG tablet Commonly known as:  COREG TAKE 1 TABLET BY MOUTH 2 (TWO) TIMES DAILY WITH A MEAL.   cefdinir 300 MG capsule Commonly known as:   OMNICEF Take 300 mg by mouth daily.   cetirizine 10 MG tablet Commonly known as:  ZYRTEC TAKE 1 TABLET (10 MG TOTAL) BY MOUTH DAILY.   clotrimazole-betamethasone cream Commonly known as:  LOTRISONE Apply 1 application topically See admin instructions. Apply to affected areas of scalp daily as directed   cyclobenzaprine 10 MG tablet Commonly known as:  FLEXERIL Take 1 tablet (10 mg total) by mouth 3 (three) times daily as needed for muscle spasms.   FLUoxetine 20 MG capsule Commonly known as:  PROZAC Take 20 mg by mouth daily.   furosemide 40 MG tablet Commonly known as:  LASIX Take 2 tablets (80 mg total) by mouth every morning AND 1 tablet (40 mg total) every evening.   gabapentin 300 MG capsule Commonly known as:  NEURONTIN Take 1 capsule (300 mg total) by mouth 3 (three) times daily.   glucose blood test strip Commonly known as:  TRUE METRIX BLOOD GLUCOSE TEST Use as instructed   glucose blood test strip Commonly known as:  ACCU-CHEK AVIVA Use as instructed   HUMALOG KWIKPEN 100 UNIT/ML KwikPen Generic drug:  insulin lispro Inject 4 Units into the skin 3 (three) times daily.   hydrALAZINE 100 MG tablet Commonly known as:  APRESOLINE Take 1 tablet (100 mg total) by mouth 3 (three) times daily.   HYDROcodone-Chlorpheniramine 5-4 MG/5ML Soln Take 5 mLs by mouth See admin instructions. Take every 24 hours as needed for cough May cause sedation   Hyoscyamine Sulfate SL 0.125 MG Subl Commonly known as:  LEVSIN/SL Place 1 tablet under the tongue every 4 (four) hours as needed.   Insulin Glargine 100 UNIT/ML Solostar Pen Commonly known as:  LANTUS SOLOSTAR Inject 20 Units into the skin 2 (two) times daily.   Insulin Pen Needle 31G X 6 MM Misc Commonly known as:  TRUEPLUS PEN NEEDLES Use as directed   ketoconazole 2 % shampoo Commonly known as:  NIZORAL Daily for the next week then twice a week until resolution   losartan 100 MG tablet Commonly known as:   COZAAR Take 100 mg by mouth daily.   montelukast 10 MG tablet Commonly known as:  SINGULAIR Take 1 tablet (10 mg total) by mouth at bedtime.   potassium chloride 10 MEQ tablet Commonly known as:  K-DUR,KLOR-CON Take 1 tablet (10 mEq total) by mouth daily.   rosuvastatin 40 MG tablet Commonly known as:  CRESTOR Take 1 tablet (40 mg total) by mouth daily.   spironolactone 50 MG tablet Commonly known as:  ALDACTONE Take 1 tablet (50 mg total) by mouth daily.   TRUE METRIX METER Devi 1 kit by Does not apply route 4 (four) times daily.   ACCU-CHEK AVIVA device Use as instructed   TRUEPLUS LANCETS 28G Misc 28 g by Does not apply route 4 (four) times daily.   ACCU-CHEK SOFTCLIX LANCETS lancets Use as instructed      Allergies  Allergen Reactions  . Eggs Or Egg-Derived Products Shortness Of Breath and Rash  . Azithromycin Swelling    Facial/throat  swelling  . Cherry Swelling    Facial swelling  . Latex Rash      The results of significant diagnostics from this hospitalization (including imaging, microbiology, ancillary and laboratory) are listed below for reference.    Significant Diagnostic Studies: Dg Chest 2 View  Result Date: 08/15/2018 CLINICAL DATA:  Cough, shortness of breath. EXAM: CHEST - 2 VIEW COMPARISON:  None. FINDINGS: The heart size and mediastinal contours are within normal limits. Both lungs are clear. Atherosclerosis of thoracic aorta is noted. No pneumothorax or pleural effusion is noted. The visualized skeletal structures are unremarkable. IMPRESSION: No active cardiopulmonary disease. Aortic Atherosclerosis (ICD10-I70.0). Electronically Signed   By: Marijo Conception, M.D.   On: 08/15/2018 07:19   Dg Chest 2 View  Result Date: 08/12/2018 CLINICAL DATA:  Dyspnea EXAM: CHEST - 2 VIEW COMPARISON:  08/11/2018 chest radiograph. FINDINGS: Stable cardiomediastinal silhouette with mild cardiomegaly. No pneumothorax. No pleural effusion. Mild hazy opacities in  the parahilar and lower lungs bilaterally, similar. IMPRESSION: Stable mild cardiomegaly. Stable mild hazy parahilar and lower lung opacities, differential includes mild congestive heart failure or atypical pneumonia. Continued chest radiograph follow-up advised. Electronically Signed   By: Ilona Sorrel M.D.   On: 08/12/2018 19:54   Dg Chest 2 View  Result Date: 08/12/2018 CLINICAL DATA:  Cough, chest congestion, and fever for the past week EXAM: CHEST - 2 VIEW COMPARISON:  PA and lateral chest x-ray of June 27, 2018 FINDINGS: The lungs are adequately inflated. The interstitial markings are increased similar to to that seen previously. No air bronchograms are observed. The cardiac silhouette is mildly enlarged. The pulmonary vascularity is mildly engorged. There is calcification in the wall of the thoracic aorta. There is no pleural effusion. The bony structures are unremarkable. IMPRESSION: Findings most compatible with mild to moderate pulmonary edema in the appropriate clinical setting. Given that there is fever, the possibility of interstitial pneumonia is raised. There is no discrete alveolar infiltrate. Electronically Signed   By: David  Martinique M.D.   On: 08/12/2018 07:34   Dg Chest Port 1 View  Result Date: 08/13/2018 CLINICAL DATA:  Pneumonia EXAM: PORTABLE CHEST 1 VIEW COMPARISON:  August 12, 2018 FINDINGS: The mediastinal contour is normal. The heart size is enlarged. Mild hazy increased pulmonary interstitium is identified in both lungs more prominently in both lung bases. There is no pleural effusion. The visualized skeletal structures are stable. IMPRESSION: Mild cardiomegaly. Probable mild interstitial edema unchanged compared prior exam. Electronically Signed   By: Abelardo Diesel M.D.   On: 08/13/2018 13:49    Microbiology: Recent Results (from the past 240 hour(s))  Respiratory Panel by PCR     Status: None   Collection Time: 08/12/18  8:05 PM  Result Value Ref Range Status    Adenovirus NOT DETECTED NOT DETECTED Final   Coronavirus 229E NOT DETECTED NOT DETECTED Final   Coronavirus HKU1 NOT DETECTED NOT DETECTED Final   Coronavirus NL63 NOT DETECTED NOT DETECTED Final   Coronavirus OC43 NOT DETECTED NOT DETECTED Final   Metapneumovirus NOT DETECTED NOT DETECTED Final   Rhinovirus / Enterovirus NOT DETECTED NOT DETECTED Final   Influenza A NOT DETECTED NOT DETECTED Final   Influenza B NOT DETECTED NOT DETECTED Final   Parainfluenza Virus 1 NOT DETECTED NOT DETECTED Final   Parainfluenza Virus 2 NOT DETECTED NOT DETECTED Final   Parainfluenza Virus 3 NOT DETECTED NOT DETECTED Final   Parainfluenza Virus 4 NOT DETECTED NOT DETECTED Final   Respiratory Syncytial  Virus NOT DETECTED NOT DETECTED Final   Bordetella pertussis NOT DETECTED NOT DETECTED Final   Chlamydophila pneumoniae NOT DETECTED NOT DETECTED Final   Mycoplasma pneumoniae NOT DETECTED NOT DETECTED Final    Comment: Performed at [facility], [place] [address] [place], [place] [address]  Urine culture     Status: None   Collection Time: 08/13/18  1:00 PM  Result Value Ref Range Status   Specimen Description URINE, RANDOM  Final   Special Requests NONE  Final   Culture   Final    NO GROWTH Performed at [facility], [place] [address], [place], [place] [address]    Report Status 08/15/2018 FINAL  Final  Blood Culture (routine x 2)     Status: None (Preliminary result)   Collection Time: 08/13/18  1:04 PM  Result Value Ref Range Status   Specimen Description BLOOD LEFT ANTECUBITAL  Final   Special Requests   Final    BOTTLES DRAWN AEROBIC AND ANAEROBIC Blood Culture adequate volume   Culture NO GROWTH 4 DAYS  Final   Report Status PENDING  Incomplete  Blood Culture (routine x 2)     Status: None (Preliminary result)   Collection Time: 08/13/18  1:34 PM  Result Value Ref Range Status   Specimen Description BLOOD BLOOD LEFT HAND  Final   Special Requests   Final    AEROBIC BOTTLE ONLY  Blood Culture results may not be optimal due to an inadequate volume of blood received in culture bottles   Culture NO GROWTH 4 DAYS  Final   Report Status PENDING  Incomplete     Labs: Basic Metabolic Panel: Recent Labs  Lab 08/12/18 2028 08/13/18 1300 08/13/18 1946 08/14/18 0615 08/15/18 0905  NA 136 137  --  143 143  K 5.8* 5.5* 5.2* 4.5 4.5  CL 102 102  --  108 106  CO2 22 21*  --  23 25  GLUCOSE 402* 298*  --  147* 164*  BUN 70* 99*  --  74* 67*  CREATININE 3.47* 3.72*  --  3.37* 2.84*  CALCIUM 9.9 10.0  --  8.8* 9.3   Liver Function Tests: Recent Labs  Lab 08/12/18 2028 08/13/18 1300  AST 19 23  ALT 16 16  ALKPHOS 62 54  BILITOT 0.4 0.1*  PROT 7.6 7.5  ALBUMIN 3.6 3.6   No results for input(s): LIPASE, AMYLASE in the last 168 hours. No results for input(s): AMMONIA in the last 168 hours. CBC: Recent Labs  Lab 08/12/18 2028 08/13/18 1300 08/14/18 0615 08/15/18 0905  WBC 27.2* 19.5* 12.1* 15.4*  NEUTROABS  --  15.4*  --  11.2*  HGB 8.9* 8.8* 8.0* 8.9*  HCT 28.9* 28.9* 25.6* 28.7*  MCV 77.1* 77.7* 76.6* 79.7*  PLT 292 291 248 267   Cardiac Enzymes: Recent Labs  Lab 08/13/18 1946 08/14/18 0046 08/14/18 0615  TROPONINI <0.03 <0.03 <0.03   BNP: BNP (last 3 results) Recent Labs    03/05/18 2149 06/27/18 1255 08/12/18 2028  BNP 32.4 39.0 340.6*    ProBNP (last 3 results) No results for input(s): PROBNP in the last 8760 hours.  CBG: Recent Labs  Lab 08/13/18 1704 08/13/18 2120 08/14/18 0744 08/14/18 1121 08/14/18 1717  GLUCAP 120* 240* 113* 313* 280*       Signed:  Domenic Polite MD.  [facility] 08/17/2018, 4:18 PM

## 2018-08-18 ENCOUNTER — Telehealth: Payer: Self-pay

## 2018-08-18 LAB — CULTURE, BLOOD (ROUTINE X 2)
Culture: NO GROWTH
Culture: NO GROWTH
Special Requests: ADEQUATE
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
## 2018-09-01 ENCOUNTER — Encounter (HOSPITAL_COMMUNITY): Payer: Self-pay

## 2018-09-01 ENCOUNTER — Ambulatory Visit (HOSPITAL_COMMUNITY): Admit: 2018-09-01 | Payer: Medicaid Other | Admitting: Internal Medicine

## 2018-09-01 SURGERY — COLONOSCOPY WITH PROPOFOL
Anesthesia: Monitor Anesthesia Care

## 2018-09-25 ENCOUNTER — Ambulatory Visit: Payer: Medicaid Other | Admitting: Cardiovascular Disease

## 2018-09-26 ENCOUNTER — Encounter: Payer: Self-pay | Admitting: *Deleted

## 2018-10-09 ENCOUNTER — Observation Stay (HOSPITAL_COMMUNITY)
Admission: EM | Admit: 2018-10-09 | Discharge: 2018-10-10 | Disposition: A | Discharge status: Home / Self Care | Payer: Medicaid Other | Attending: Internal Medicine | Admitting: Internal Medicine

## 2018-10-09 ENCOUNTER — Encounter (HOSPITAL_COMMUNITY): Payer: Self-pay | Admitting: Emergency Medicine

## 2018-10-09 ENCOUNTER — Other Ambulatory Visit: Payer: Self-pay

## 2018-10-09 ENCOUNTER — Emergency Department (HOSPITAL_COMMUNITY): Payer: Medicaid Other

## 2018-10-09 DIAGNOSIS — F419 Anxiety disorder, unspecified: Secondary | ICD-10-CM | POA: Diagnosis not present

## 2018-10-09 DIAGNOSIS — F329 Major depressive disorder, single episode, unspecified: Secondary | ICD-10-CM | POA: Diagnosis not present

## 2018-10-09 DIAGNOSIS — E1122 Type 2 diabetes mellitus with diabetic chronic kidney disease: Secondary | ICD-10-CM | POA: Diagnosis not present

## 2018-10-09 DIAGNOSIS — E039 Hypothyroidism, unspecified: Secondary | ICD-10-CM | POA: Diagnosis not present

## 2018-10-09 DIAGNOSIS — Z79899 Other long term (current) drug therapy: Secondary | ICD-10-CM | POA: Insufficient documentation

## 2018-10-09 DIAGNOSIS — D649 Anemia, unspecified: Secondary | ICD-10-CM | POA: Diagnosis not present

## 2018-10-09 DIAGNOSIS — I130 Hypertensive heart and chronic kidney disease with heart failure and stage 1 through stage 4 chronic kidney disease, or unspecified chronic kidney disease: Principal | ICD-10-CM | POA: Insufficient documentation

## 2018-10-09 DIAGNOSIS — I252 Old myocardial infarction: Secondary | ICD-10-CM | POA: Insufficient documentation

## 2018-10-09 DIAGNOSIS — I25119 Atherosclerotic heart disease of native coronary artery with unspecified angina pectoris: Secondary | ICD-10-CM | POA: Diagnosis not present

## 2018-10-09 DIAGNOSIS — E1129 Type 2 diabetes mellitus with other diabetic kidney complication: Secondary | ICD-10-CM

## 2018-10-09 DIAGNOSIS — G4730 Sleep apnea, unspecified: Secondary | ICD-10-CM | POA: Diagnosis not present

## 2018-10-09 DIAGNOSIS — D631 Anemia in chronic kidney disease: Secondary | ICD-10-CM | POA: Diagnosis not present

## 2018-10-09 DIAGNOSIS — Z794 Long term (current) use of insulin: Secondary | ICD-10-CM | POA: Diagnosis not present

## 2018-10-09 DIAGNOSIS — IMO0001 Reserved for inherently not codable concepts without codable children: Secondary | ICD-10-CM | POA: Diagnosis present

## 2018-10-09 DIAGNOSIS — Z8249 Family history of ischemic heart disease and other diseases of the circulatory system: Secondary | ICD-10-CM | POA: Diagnosis not present

## 2018-10-09 DIAGNOSIS — N184 Chronic kidney disease, stage 4 (severe): Secondary | ICD-10-CM

## 2018-10-09 DIAGNOSIS — Z7982 Long term (current) use of aspirin: Secondary | ICD-10-CM | POA: Insufficient documentation

## 2018-10-09 DIAGNOSIS — D72829 Elevated white blood cell count, unspecified: Secondary | ICD-10-CM | POA: Diagnosis not present

## 2018-10-09 DIAGNOSIS — I5032 Chronic diastolic (congestive) heart failure: Secondary | ICD-10-CM

## 2018-10-09 DIAGNOSIS — I10 Essential (primary) hypertension: Secondary | ICD-10-CM | POA: Diagnosis present

## 2018-10-09 DIAGNOSIS — N183 Chronic kidney disease, stage 3 (moderate): Secondary | ICD-10-CM

## 2018-10-09 LAB — CBC
HCT: 25.6 % — ABNORMAL LOW (ref 36.0–46.0)
HCT: 23.6 % — ABNORMAL LOW (ref 36.0–46.0)
Hemoglobin: 7.6 g/dL — ABNORMAL LOW (ref 12.0–15.0)
Hemoglobin: 7 g/dL — ABNORMAL LOW (ref 12.0–15.0)
MCH: 23.1 pg — ABNORMAL LOW (ref 26.0–34.0)
MCH: 23 pg — ABNORMAL LOW (ref 26.0–34.0)
MCHC: 29.7 g/dL — ABNORMAL LOW (ref 30.0–36.0)
MCHC: 29.7 g/dL — ABNORMAL LOW (ref 30.0–36.0)
MCV: 77.8 fL — ABNORMAL LOW (ref 80.0–100.0)
MCV: 77.6 fL — ABNORMAL LOW (ref 80.0–100.0)
PLATELETS: 267 10*3/uL (ref 150–400)
Platelets: 291 10*3/uL (ref 150–400)
RBC: 3.29 MIL/uL — ABNORMAL LOW (ref 3.87–5.11)
RBC: 3.04 MIL/uL — ABNORMAL LOW (ref 3.87–5.11)
RDW: 18.6 % — ABNORMAL HIGH (ref 11.5–15.5)
RDW: 18.5 % — ABNORMAL HIGH (ref 11.5–15.5)
WBC: 15.6 10*3/uL — ABNORMAL HIGH (ref 4.0–10.5)
WBC: 16.7 10*3/uL — AB (ref 4.0–10.5)
nRBC: 0.1 % (ref 0.0–0.2)
nRBC: 0.1 % (ref 0.0–0.2)

## 2018-10-09 LAB — FOLATE: Folate: 13.2 ng/mL (ref >5.9)

## 2018-10-09 LAB — COMPREHENSIVE METABOLIC PANEL
ALBUMIN: 3.8 g/dL (ref 3.5–5.0)
ALT: 13 U/L (ref 0–44)
AST: 7 U/L — ABNORMAL LOW (ref 15–41)
Alkaline Phosphatase: 71 U/L (ref 38–126)
Anion gap: 8 (ref 5–15)
BILIRUBIN TOTAL: 0.3 mg/dL (ref 0.3–1.2)
BUN: 61 mg/dL — ABNORMAL HIGH (ref 8–23)
CO2: 23 mmol/L (ref 22–32)
Calcium: 9.8 mg/dL (ref 8.9–10.3)
Chloride: 108 mmol/L (ref 98–111)
Creatinine, Ser: 3.11 mg/dL — ABNORMAL HIGH (ref 0.44–1.00)
GFR calc Af Amer: 18 mL/min — ABNORMAL LOW (ref >60)
GFR calc non Af Amer: 15 mL/min — ABNORMAL LOW (ref >60)
Glucose, Bld: 194 mg/dL — ABNORMAL HIGH (ref 70–99)
POTASSIUM: 4.1 mmol/L (ref 3.5–5.1)
Sodium: 139 mmol/L (ref 135–145)
Total Protein: 8 g/dL (ref 6.5–8.1)

## 2018-10-09 LAB — FERRITIN: Ferritin: 34 ng/mL (ref 11–307)

## 2018-10-09 LAB — IRON AND TIBC
Iron: 150 ug/dL (ref 28–170)
Saturation Ratios: 37 % — ABNORMAL HIGH (ref 10.4–31.8)
TIBC: 406 ug/dL (ref 250–450)
UIBC: 256 ug/dL

## 2018-10-09 LAB — VITAMIN B12: Vitamin B-12: 491 pg/mL (ref 180–914)

## 2018-10-09 LAB — RETICULOCYTES
IMMATURE RETIC FRACT: 40.8 % — AB (ref 2.3–15.9)
RBC.: 3.04 MIL/uL — ABNORMAL LOW (ref 3.87–5.11)
Retic Count, Absolute: 105.5 10*3/uL (ref 19.0–186.0)
Retic Ct Pct: 3.5 % — ABNORMAL HIGH (ref 0.4–3.1)

## 2018-10-09 LAB — PREPARE RBC (CROSSMATCH)

## 2018-10-09 LAB — ABO/RH: ABO/RH(D): A POS

## 2018-10-09 MED ORDER — BUSPIRONE HCL 10 MG PO TABS
5.0000 mg | ORAL_TABLET | Freq: Three times a day (TID) | ORAL | Status: DC
  Administered 2018-10-09 – 2018-10-10 (×2): 5 mg via ORAL
  Filled 2018-10-09 (×2): qty 1
  Filled 2018-10-09 (×3): qty 0.5

## 2018-10-09 MED ORDER — LOSARTAN POTASSIUM 50 MG PO TABS
100.0000 mg | ORAL_TABLET | Freq: Every day | ORAL | Status: DC
  Administered 2018-10-10: 100 mg via ORAL
  Filled 2018-10-09: qty 2

## 2018-10-09 MED ORDER — MONTELUKAST SODIUM 10 MG PO TABS
10.0000 mg | ORAL_TABLET | Freq: Every day | ORAL | Status: DC
  Administered 2018-10-09: 10 mg via ORAL
  Filled 2018-10-09: qty 1

## 2018-10-09 MED ORDER — SODIUM CHLORIDE 0.9 % IV SOLN
10.0000 mL/h | Freq: Once | INTRAVENOUS | Status: AC
  Administered 2018-10-09: 10 mL/h via INTRAVENOUS

## 2018-10-09 MED ORDER — ONDANSETRON HCL 4 MG PO TABS: 4.0000 mg | ORAL_TABLET | Freq: Four times a day (QID) | ORAL | Status: DC | PRN

## 2018-10-09 MED ORDER — HEPARIN SODIUM (PORCINE) 5000 UNIT/ML IJ SOLN
5000.0000 [IU] | Freq: Three times a day (TID) | INTRAMUSCULAR | Status: DC
  Administered 2018-10-09 – 2018-10-10 (×2): 5000 [IU] via SUBCUTANEOUS
  Filled 2018-10-09 (×2): qty 1

## 2018-10-09 MED ORDER — LORATADINE 10 MG PO TABS
10.0000 mg | ORAL_TABLET | Freq: Every day | ORAL | Status: DC
  Administered 2018-10-10: 10 mg via ORAL
  Filled 2018-10-09: qty 1

## 2018-10-09 MED ORDER — ALBUTEROL SULFATE (2.5 MG/3ML) 0.083% IN NEBU: 2.5000 mg | INHALATION_SOLUTION | Freq: Four times a day (QID) | RESPIRATORY_TRACT | Status: DC | PRN

## 2018-10-09 MED ORDER — POTASSIUM CHLORIDE CRYS ER 10 MEQ PO TBCR
10.0000 meq | EXTENDED_RELEASE_TABLET | Freq: Every day | ORAL | Status: DC
  Administered 2018-10-10: 10 meq via ORAL
  Filled 2018-10-09: qty 1

## 2018-10-09 MED ORDER — INSULIN GLARGINE 100 UNIT/ML ~~LOC~~ SOLN
20.0000 [IU] | Freq: Every day | SUBCUTANEOUS | Status: DC
  Administered 2018-10-09: 20 [IU] via SUBCUTANEOUS
  Filled 2018-10-09 (×2): qty 0.2

## 2018-10-09 MED ORDER — INSULIN ASPART 100 UNIT/ML ~~LOC~~ SOLN
0.0000 [IU] | Freq: Three times a day (TID) | SUBCUTANEOUS | Status: DC
  Administered 2018-10-10: 2 [IU] via SUBCUTANEOUS

## 2018-10-09 MED ORDER — HYDRALAZINE HCL 50 MG PO TABS
100.0000 mg | ORAL_TABLET | Freq: Three times a day (TID) | ORAL | Status: DC
  Administered 2018-10-09 – 2018-10-10 (×2): 100 mg via ORAL
  Filled 2018-10-09 (×2): qty 2

## 2018-10-09 MED ORDER — FUROSEMIDE 40 MG PO TABS
40.0000 mg | ORAL_TABLET | Freq: Every day | ORAL | Status: DC
  Administered 2018-10-09: 40 mg via ORAL
  Filled 2018-10-09: qty 1

## 2018-10-09 MED ORDER — ASPIRIN EC 81 MG PO TBEC
81.0000 mg | DELAYED_RELEASE_TABLET | Freq: Two times a day (BID) | ORAL | Status: DC
  Administered 2018-10-10: 81 mg via ORAL
  Filled 2018-10-09: qty 1

## 2018-10-09 MED ORDER — HYOSCYAMINE SULFATE 0.125 MG SL SUBL
0.1250 mg | SUBLINGUAL_TABLET | SUBLINGUAL | Status: DC | PRN
  Filled 2018-10-09: qty 1

## 2018-10-09 MED ORDER — FLUOXETINE HCL 20 MG PO CAPS
20.0000 mg | ORAL_CAPSULE | Freq: Every day | ORAL | Status: DC
  Administered 2018-10-10: 20 mg via ORAL
  Filled 2018-10-09: qty 1

## 2018-10-09 MED ORDER — FUROSEMIDE 40 MG PO TABS
80.0000 mg | ORAL_TABLET | Freq: Every day | ORAL | Status: DC
  Administered 2018-10-10: 80 mg via ORAL
  Filled 2018-10-09: qty 2

## 2018-10-09 MED ORDER — ACETAMINOPHEN 325 MG PO TABS: 650.0000 mg | ORAL_TABLET | Freq: Four times a day (QID) | ORAL | Status: DC | PRN

## 2018-10-09 MED ORDER — ONDANSETRON HCL 4 MG/2ML IJ SOLN: 4.0000 mg | Freq: Four times a day (QID) | INTRAMUSCULAR | Status: DC | PRN

## 2018-10-09 MED ORDER — CARVEDILOL 25 MG PO TABS
25.0000 mg | ORAL_TABLET | Freq: Two times a day (BID) | ORAL | Status: DC
  Administered 2018-10-10: 25 mg via ORAL
  Filled 2018-10-09: qty 1

## 2018-10-09 MED ORDER — SPIRONOLACTONE 25 MG PO TABS
50.0000 mg | ORAL_TABLET | Freq: Every day | ORAL | Status: DC
  Administered 2018-10-10: 50 mg via ORAL
  Filled 2018-10-09: qty 2

## 2018-10-09 MED ORDER — AMLODIPINE BESYLATE 5 MG PO TABS
10.0000 mg | ORAL_TABLET | Freq: Every day | ORAL | Status: DC
  Administered 2018-10-10: 10 mg via ORAL
  Filled 2018-10-09: qty 2

## 2018-10-09 MED ORDER — ALBUTEROL SULFATE HFA 108 (90 BASE) MCG/ACT IN AERS: 1.0000 | INHALATION_SPRAY | Freq: Four times a day (QID) | RESPIRATORY_TRACT | Status: DC | PRN

## 2018-10-09 MED ORDER — ACETAMINOPHEN 650 MG RE SUPP: 650.0000 mg | Freq: Four times a day (QID) | RECTAL | Status: DC | PRN

## 2018-10-09 MED ORDER — CYCLOBENZAPRINE HCL 10 MG PO TABS: 10.0000 mg | ORAL_TABLET | Freq: Three times a day (TID) | ORAL | Status: DC | PRN

## 2018-10-09 MED ORDER — SODIUM CHLORIDE 0.9 % IV SOLN: INTRAVENOUS | Status: DC

## 2018-10-09 MED ORDER — GABAPENTIN 400 MG PO CAPS
800.0000 mg | ORAL_CAPSULE | Freq: Three times a day (TID) | ORAL | Status: DC | PRN
  Administered 2018-10-09: 800 mg via ORAL
  Filled 2018-10-09: qty 2

## 2018-10-09 MED ORDER — INSULIN GLARGINE 100 UNIT/ML ~~LOC~~ SOLN
30.0000 [IU] | Freq: Every day | SUBCUTANEOUS | Status: DC
  Administered 2018-10-10: 30 [IU] via SUBCUTANEOUS
  Filled 2018-10-09: qty 0.3

## 2018-10-09 MED ORDER — ROSUVASTATIN CALCIUM 40 MG PO TABS
40.0000 mg | ORAL_TABLET | Freq: Every day | ORAL | Status: DC
  Administered 2018-10-09: 40 mg via ORAL
  Filled 2018-10-09: qty 1
  Filled 2018-10-09: qty 2

## 2018-10-09 NOTE — ED Triage Notes (Signed)
Per pt, states she went to PCP due to feeling light headed-states PCP called her at home today due to low "blood"

## 2018-10-09 NOTE — ED Notes (Signed)
ED TO INPATIENT HANDOFF REPORT  Name/Age/Gender Brenda Sims 63 y.o. female  Code Status    Code Status Orders  (From admission, onward)         Start     Ordered   10/09/18 2119  Full code  Continuous     10/09/18 2121        Code Status History    Date Active Date Inactive Code Status Order ID Comments User Context   08/13/2018 1905 08/15/2018 0604 Full Code [id number]  Merton Border, MD Inpatient   08/13/2018 0154 08/13/2018 0528 Full Code [id number]  Norval Morton, MD ED   06/27/2018 1807 06/28/2018 1802 Full Code [id number]  Guilford Shi, MD ED   03/06/2018 0028 03/07/2018 1808 Full Code [id number]  Ivor Costa, MD ED   10/19/2017 2342 10/23/2017 1709 Full Code [id number]  Ivor Costa, MD ED   10/07/2017 2234 10/08/2017 2139 Full Code [id number]  Rise Patience, MD ED   09/03/2017 0526 09/12/2017 1524 Full Code [id number]  Arnell Asal, NP Inpatient   08/29/2017 1752 09/03/2017 0526 Full Code [id number]  Rosita Fire, MD ED      Home/SNF/Other Home  Chief Complaint low blood   Level of Care/Admitting Diagnosis ED Disposition    ED Disposition Condition Comment   [facility]: [facility] [[id number]]  Level of Care: Telemetry [5]  Admit to tele based on following criteria: Monitor for Ischemic changes  Diagnosis: Symptomatic anemia [4917915]  Admitting Physician: Rise Patience [id number]  Attending Physician: Rise Patience [[id number]]  PT Class (Do Not Modify): Observation [104]  PT Acc Code (Do Not Modify): Observation [10022]       Medical History Past Medical History:  Diagnosis Date  . Anemia   . Anxiety   . CAD (coronary artery disease)   . CHF (congestive heart failure) ([place])   . CKD (chronic kidney disease)   . COPD (chronic obstructive pulmonary disease) ([place])   . Depression   . Diabetes mellitus without complication ([place])   . Hypertension   . Hypothyroid   . MI (myocardial infarction) ([place])   . Sleep apnea    no cpap , "never gave me one"    Allergies Allergies  Allergen Reactions  . Eggs Or Egg-Derived Products Shortness Of Breath and Rash  . Azithromycin Swelling    Facial/throat swelling  . Cherry Swelling    Facial swelling  . Latex Rash    IV Location/Drains/Wounds Patient Lines/Drains/Airways Status   Active Line/Drains/Airways    Name:   Placement date:   Placement time:   Site:   Days:   Peripheral IV 10/09/18 Right Antecubital   10/09/18    1934    Antecubital   less than 1   Peripheral IV 10/09/18 Left Antecubital   10/09/18    2138    Antecubital   less than 1          Labs/Imaging Results for orders placed or performed during the hospital encounter of 10/09/18 (from the past 48 hour(s))  Comprehensive metabolic panel     Status: Abnormal   Collection Time: 10/09/18  7:32 PM  Result Value Ref Range   Sodium 139 135 - 145 mmol/L   Potassium 4.1 3.5 - 5.1 mmol/L   Chloride 108 98 - 111 mmol/L   CO2 23 22 - 32 mmol/L   Glucose, Bld 194 (H) 70 - 99 mg/dL   BUN 61 (H) 8 - 23 mg/dL  Creatinine, Ser 3.11 (H) 0.44 - 1.00 mg/dL   Calcium 9.8 8.9 - 10.3 mg/dL   Total Protein 8.0 6.5 - 8.1 g/dL   Albumin 3.8 3.5 - 5.0 g/dL   AST 7 (L) 15 - 41 U/L   ALT 13 0 - 44 U/L   Alkaline Phosphatase 71 38 - 126 U/L   Total Bilirubin 0.3 0.3 - 1.2 mg/dL   GFR calc non Af Amer 15 (L) >60 mL/min   GFR calc Af Amer 18 (L) >60 mL/min   Anion gap 8 5 - 15    Comment: Performed at [facility], [place] [address] [place], [place] [address]  CBC     Status: Abnormal   Collection Time: 10/09/18  7:32 PM  Result Value Ref Range   WBC 15.6 (H) 4.0 - 10.5 K/uL   RBC 3.29 (L) 3.87 - 5.11 MIL/uL   Hemoglobin 7.6 (L) 12.0 - 15.0 g/dL    Comment: Reticulocyte Hemoglobin testing may be clinically indicated, consider ordering this additional test [id number]    HCT 25.6 (L) 36.0 - 46.0 %   MCV 77.8 (L) 80.0 - 100.0 fL   MCH 23.1 (L) 26.0 - 34.0 pg   MCHC 29.7 (L) 30.0 - 36.0  g/dL   RDW 18.6 (H) 11.5 - 15.5 %   Platelets 291 150 - 400 K/uL   nRBC 0.1 0.0 - 0.2 %    Comment: Performed at [facility], [place] [address] [place], [place] [address]  Type and screen [place]     Status: None   Collection Time: 10/09/18  7:32 PM  Result Value Ref Range   ABO/RH(D) A POS    Antibody Screen NEG    Sample Expiration      10/12/2018 Performed at [facility], [place] [address], [place], [place] [address]   Prepare RBC     Status: None   Collection Time: 10/09/18  8:07 PM  Result Value Ref Range   Order Confirmation      ORDER PROCESSED BY BLOOD BANK Performed at [place] [address] [place], [place] [address]   CBC     Status: Abnormal   Collection Time: 10/09/18  9:30 PM  Result Value Ref Range   WBC 16.7 (H) 4.0 - 10.5 K/uL   RBC 3.04 (L) 3.87 - 5.11 MIL/uL   Hemoglobin 7.0 (L) 12.0 - 15.0 g/dL   HCT 23.6 (L) 36.0 - 46.0 %   MCV 77.6 (L) 80.0 - 100.0 fL   MCH 23.0 (L) 26.0 - 34.0 pg   MCHC 29.7 (L) 30.0 - 36.0 g/dL   RDW 18.5 (H) 11.5 - 15.5 %   Platelets 267 150 - 400 K/uL   nRBC 0.1 0.0 - 0.2 %    Comment: Performed at [facility], [place] [address] [place], [place] [address]  Reticulocytes     Status: Abnormal   Collection Time: 10/09/18  9:30 PM  Result Value Ref Range   Retic Ct Pct 3.5 (H) 0.4 - 3.1 %   RBC. 3.04 (L) 3.87 - 5.11 MIL/uL   Retic Count, Absolute 105.5 19.0 - 186.0 K/uL   Immature Retic Fract 40.8 (H) 2.3 - 15.9 %    Comment: Performed at [facility], [place] [address] [place],  [address]   Dg Chest 2 View  Result Date: 10/09/2018 CLINICAL DATA:  Lightheaded EXAM: CHEST - 2 VIEW COMPARISON:  08/15/2018 FINDINGS:  Mild cardiomegaly. Low lung volumes. Hazy airspace disease at the right lung base. No pneumothorax or pleural effusion. IMPRESSION: Hazy airspace disease at the right lung base. Electronically Signed    By: Marybelle Killings M.D.   On: 10/09/2018 20:49    Pending Labs Unresulted Labs (From admission, onward)    Start     Ordered   10/10/18 6789  Basic metabolic panel  Tomorrow morning,   R     10/09/18 2121   10/10/18 0500  CBC  Tomorrow morning,   R     10/09/18 2121   10/09/18 2121  Vitamin B12  (Anemia Panel (PNL))  Once,   R     10/09/18 2121   10/09/18 2121  Folate  (Anemia Panel (PNL))  Once,   R     10/09/18 2121   10/09/18 2121  Iron and TIBC  (Anemia Panel (PNL))  Once,   R     10/09/18 2121   10/09/18 2121  Ferritin  (Anemia Panel (PNL))  Once,   R     10/09/18 2121   10/09/18 1932  ABO/Rh  Once,   R     10/09/18 1932          Vitals/Pain Today's Vitals   10/09/18 1846 10/09/18 2118 10/09/18 2130 10/09/18 2202  BP: (!) 106/55 (!) 114/46 (!) 107/59   Pulse: 77 90 85   Resp: 16 (!) 21 (!) 21   Temp: 97.7 F (36.5 C)     SpO2: 96% 94% 94%   PainSc:    0-No pain    Isolation Precautions No active isolations  Medications Medications  aspirin EC tablet 81 mg (has no administration in time range)  amLODipine (NORVASC) tablet 10 mg (has no administration in time range)  carvedilol (COREG) tablet 25 mg (has no administration in time range)  hydrALAZINE (APRESOLINE) tablet 100 mg (has no administration in time range)  losartan (COZAAR) tablet 100 mg (has no administration in time range)  rosuvastatin (CRESTOR) tablet 40 mg (has no administration in time range)  spironolactone (ALDACTONE) tablet 50 mg (has no administration in time range)  busPIRone (BUSPAR) tablet 5 mg (has no administration in time range)  FLUoxetine (PROZAC) capsule 20 mg (has no administration in time range)  Insulin Glargine (LANTUS) Solostar Pen 20 Units (has no administration in time range)  Hyoscyamine Sulfate SL SUBL 1 tablet (has no administration in time range)  cyclobenzaprine (FLEXERIL) tablet 10 mg (has no administration in time range)  gabapentin (NEURONTIN) tablet 800 mg (has no  administration in time range)  potassium chloride (K-DUR,KLOR-CON) CR tablet 10 mEq (has no administration in time range)  albuterol (PROVENTIL HFA;VENTOLIN HFA) 108 (90 Base) MCG/ACT inhaler 1-2 puff (has no administration in time range)  loratadine (CLARITIN) tablet 10 mg (has no administration in time range)  montelukast (SINGULAIR) tablet 10 mg (has no administration in time range)  acetaminophen (TYLENOL) tablet 650 mg (has no administration in time range)    Or  acetaminophen (TYLENOL) suppository 650 mg (has no administration in time range)  ondansetron (ZOFRAN) tablet 4 mg (has no administration in time range)    Or  ondansetron (ZOFRAN) injection 4 mg (has no administration in time range)  insulin aspart (novoLOG) injection 0-9 Units (has no administration in time range)  heparin injection 5,000 Units (has no administration in time range)  0.9 %  sodium chloride infusion (10 mL/hr Intravenous New Bag/Given (Non-Interop) 10/09/18 2122)    Mobility walks

## 2018-10-09 NOTE — ED Provider Notes (Addendum)
[facility] DEPT Provider Note   CSN: [id number] Arrival date & time: 10/09/18  1824    History   Chief Complaint Chief Complaint  Patient presents with  . Anemia    HPI Brenda Sims is a 63 y.o. female.     Patient followed by Dr. Arelia Sneddon.  Sent in for low hemoglobin.  Her hemoglobins have been in the 8 range throughout the month of January patient has a known history of anemia has required blood transfusions in the past but not recently.  Patient also has chronic kidney disease according to her past labs stage IV.  Patient states that she has felt lightheaded for about a month.  Labs that were done not viewable to so we will repeat them.  Patient denies any chest pain any shortness of breath headache any syncope denies any blood in her bowel movements.  Patient is not on any blood thinners.  Patient does have a history of congestive heart failure.     Past Medical History:  Diagnosis Date  . Anemia   . Anxiety   . CAD (coronary artery disease)   . CHF (congestive heart failure) ([place])   . CKD (chronic kidney disease)   . COPD (chronic obstructive pulmonary disease) ([place])   . Depression   . Diabetes mellitus without complication ([place])   . Hypertension   . Hypothyroid   . MI (myocardial infarction) ([place])   . Sleep apnea    no cpap , "never gave me one"    Patient Active Problem List   Diagnosis Date Noted  . Symptomatic anemia 10/09/2018  . Community acquired pneumonia 08/13/2018  . Upper airway cough syndrome 07/04/2018  . CHF exacerbation ([place]) 06/27/2018  . Left shoulder pain 06/27/2018  . COPD exacerbation ([place]) 03/05/2018  . CAD (coronary artery disease) 03/05/2018  . Iron deficiency anemia 02/26/2018  . Sleep apnea 11/01/2017  . HLD (hyperlipidemia) 10/19/2017  . Anxiety and depression 10/19/2017  . Chest pain 10/19/2017  . Hypoglycemia   . Hypoxia   . Bronchitis 10/07/2017  . Chronic diastolic heart failure ([place])  10/07/2017  . Acute renal failure superimposed on stage 3 chronic kidney disease ([place]) 10/07/2017  . Hypokalemia 10/07/2017  . Normocytic normochromic anemia 10/07/2017  . Type II diabetes mellitus with renal manifestations ([place])   . Leukocytosis   . Acute blood loss anemia   . Seizures ([place])   . Coronary artery disease involving native coronary artery of native heart with angina pectoris ([place]) 09/05/2017  . History of cardiac arrest 09/03/2017  . Cardiopulmonary arrest ([place])   . Acute encephalopathy   . Acute respiratory failure with hypoxia ([place])   . Acute on chronic combined systolic and diastolic CHF (congestive heart failure) ([place]) 08/29/2017  . Essential hypertension 08/29/2017  . Type 1 diabetes mellitus without complication ([place]) 33/54/5625  . Tobacco abuse 08/29/2017  . Acute pulmonary edema (HCC)   . CKD (chronic kidney disease) stage 4, GFR 15-29 ml/min (HCC)   . Dyspnea on exertion     Past Surgical History:  Procedure Laterality Date  . APPENDECTOMY    . BACK SURGERY    . LEFT HEART CATH AND CORONARY ANGIOGRAPHY N/A 09/11/2017   Procedure: LEFT HEART CATH AND CORONARY ANGIOGRAPHY;  Surgeon: Martinique, Peter M, MD;  Location: [facility];  Service: Cardiovascular;  Laterality: N/A;  . TOTAL KNEE ARTHROPLASTY Right 2007     OB History   No obstetric history on file.  Home Medications    Prior to Admission medications   Medication Sig Start Date End Date Taking? Authorizing Provider  albuterol (PROVENTIL HFA;VENTOLIN HFA) 108 (90 Base) MCG/ACT inhaler Inhale 1-2 puffs into the lungs every 6 (six) hours as needed for shortness of breath.   Yes [provider]  amLODipine (NORVASC) 10 MG tablet Take 1 tablet (10 mg total) by mouth daily. MUST MAKE APPT FOR FURTHER REFILLS Patient taking differently: Take 10 mg by mouth daily.  06/24/18  Yes Newlin, Charlane Ferretti, MD  busPIRone (BUSPAR) 5 MG tablet Take 1 tablet (5 mg total) by mouth 3 (three) times daily.  01/21/18  Yes Newlin, Charlane Ferretti, MD  carvedilol (COREG) 25 MG tablet TAKE 1 TABLET BY MOUTH 2 (TWO) TIMES DAILY WITH A MEAL. Patient taking differently: Take 25 mg by mouth 2 (two) times daily with a meal.  05/29/18  Yes Newlin, Enobong, MD  cetirizine (ZYRTEC) 10 MG tablet TAKE 1 TABLET (10 MG TOTAL) BY MOUTH DAILY. 03/11/18  Yes Charlott Rakes, MD  cyclobenzaprine (FLEXERIL) 10 MG tablet Take 1 tablet (10 mg total) by mouth 3 (three) times daily as needed for muscle spasms. 06/28/18  Yes Purohit, Konrad Dolores, MD  diphenhydrAMINE (BENADRYL) 25 MG tablet Take 25 mg by mouth every 6 (six) hours as needed for itching or allergies.   Yes [provider]  FLUoxetine (PROZAC) 20 MG capsule Take 20 mg by mouth daily. 05/13/18  Yes [provider]  furosemide (LASIX) 40 MG tablet Take 2 tablets (80 mg total) by mouth every morning AND 1 tablet (40 mg total) every evening. Patient taking differently: Take  (80 mg total) by mouth every morning AND  (40 mg total) at 14:00 02/24/18 02/19/19 Yes Croitoru, Mihai, MD  gabapentin (NEURONTIN) 800 MG tablet Take 800 mg by mouth 3 (three) times daily as needed (pain).  08/30/18  Yes [provider]  hydrALAZINE (APRESOLINE) 100 MG tablet Take 1 tablet (100 mg total) by mouth 3 (three) times daily. 01/21/18  Yes Charlott Rakes, MD  Hyoscyamine Sulfate SL (LEVSIN/SL) 0.125 MG SUBL Place 1 tablet under the tongue every 4 (four) hours as needed. Patient taking differently: Place 1 tablet under the tongue every 4 (four) hours as needed (for abdominal cramping).  05/07/18  Yes Upstill, Nehemiah Settle, PA-C  Insulin Glargine (LANTUS SOLOSTAR) 100 UNIT/ML Solostar Pen Inject 20 Units into the skin 2 (two) times daily. Patient taking differently: Inject 20-30 Units into the skin See admin instructions. Take 30 units in the morning and at noon. Take 20 units with supper 01/21/18  Yes Newlin, Enobong, MD  insulin lispro (HUMALOG KWIKPEN) 100 UNIT/ML KwikPen Inject 4 Units  into the skin 3 (three) times daily.   Yes [provider]  losartan (COZAAR) 100 MG tablet Take 100 mg by mouth daily. 05/13/18  Yes [provider]  montelukast (SINGULAIR) 10 MG tablet Take 1 tablet (10 mg total) by mouth at bedtime. 01/21/18  Yes Charlott Rakes, MD  Multiple Vitamin (MULTIVITAMIN) tablet Take 1 tablet by mouth daily.   Yes [provider]  potassium chloride SA (K-DUR,KLOR-CON) 10 MEQ tablet Take 1 tablet (10 mEq total) by mouth daily. 01/21/18  Yes Charlott Rakes, MD  rosuvastatin (CRESTOR) 40 MG tablet Take 1 tablet (40 mg total) by mouth daily. Patient taking differently: Take 40 mg by mouth at bedtime.  01/21/18 10/09/18 Yes Charlott Rakes, MD  spironolactone (ALDACTONE) 50 MG tablet Take 1 tablet (50 mg total) by mouth daily. 01/21/18  Yes Newlin,  Charlane Ferretti, MD  ACCU-CHEK SOFTCLIX LANCETS lancets Use as instructed 10/16/17   Brayton Caves, PA-C  aspirin EC 81 MG tablet Take 1 tablet (81 mg total) by mouth daily. Patient taking differently: Take 81 mg by mouth 2 (two) times daily.  10/16/17   Brayton Caves, PA-C  Blood Glucose Monitoring Suppl (ACCU-CHEK AVIVA) device Use as instructed 10/16/17 10/16/18  Brayton Caves, PA-C  Blood Glucose Monitoring Suppl (TRUE METRIX METER) DEVI 1 kit by Does not apply route 4 (four) times daily. 10/16/17   Brayton Caves, PA-C  gabapentin (NEURONTIN) 300 MG capsule Take 1 capsule (300 mg total) by mouth 3 (three) times daily. Patient not taking: Reported on 10/09/2018 01/21/18   Charlott Rakes, MD  glucose blood (ACCU-CHEK AVIVA) test strip Use as instructed 10/16/17   Brayton Caves, PA-C  glucose blood (TRUE METRIX BLOOD GLUCOSE TEST) test strip Use as instructed 10/16/17   Brayton Caves, PA-C  Insulin Pen Needle (TRUEPLUS PEN NEEDLES) 31G X 6 MM MISC Use as directed 11/08/17   Charlott Rakes, MD  ketoconazole (NIZORAL) 2 % shampoo Daily for the next week then twice a week until resolution Patient not taking: Reported  on 10/09/2018 02/17/18   Zigmund Gottron, NP  TRUEPLUS LANCETS 28G MISC 28 g by Does not apply route 4 (four) times daily. 10/16/17   Brayton Caves, PA-C    Family History Family History  Problem Relation Age of Onset  . Hypertension Mother   . Colon cancer Neg Hx   . Esophageal cancer Neg Hx     Social History Social History   Tobacco Use  . Smoking status: Never Smoker  . Smokeless tobacco: Never Used  Substance Use Topics  . Alcohol use: No    Frequency: Never  . Drug use: No     Allergies   Eggs or egg-derived products; Azithromycin; Cherry; and Latex   Review of Systems Review of Systems  Constitutional: Negative for chills and fever.  HENT: Negative for congestion, rhinorrhea and sore throat.   Eyes: Negative for visual disturbance.  Respiratory: Negative for cough and shortness of breath.   Cardiovascular: Negative for chest pain and leg swelling.  Gastrointestinal: Negative for abdominal pain, blood in stool, diarrhea, nausea and vomiting.  Genitourinary: Negative for dysuria.  Musculoskeletal: Negative for back pain and neck pain.  Skin: Negative for rash.  Neurological: Positive for light-headedness. Negative for dizziness and headaches.  Hematological: Does not bruise/bleed easily.  Psychiatric/Behavioral: Negative for confusion.     Physical Exam Updated Vital Signs BP (!) 114/46   Pulse 90   Temp 97.7 F (36.5 C)   Resp (!) 21   SpO2 94%   Physical Exam Vitals signs and nursing note reviewed.  Constitutional:      General: She is not in acute distress.    Appearance: Normal appearance. She is well-developed.  HENT:     Head: Normocephalic and atraumatic.     Mouth/Throat:     Mouth: Mucous membranes are moist.  Eyes:     Extraocular Movements: Extraocular movements intact.     Conjunctiva/sclera: Conjunctivae normal.     Pupils: Pupils are equal, round, and reactive to light.  Neck:     Musculoskeletal: Normal range of motion and neck  supple.  Cardiovascular:     Rate and Rhythm: Normal rate and regular rhythm.     Heart sounds: Normal heart sounds. No murmur.  Pulmonary:     Effort: Pulmonary effort  is normal. No respiratory distress.     Breath sounds: Normal breath sounds.  Abdominal:     General: Bowel sounds are normal.     Palpations: Abdomen is soft.     Tenderness: There is no abdominal tenderness.  Musculoskeletal: Normal range of motion.  Skin:    General: Skin is warm and dry.  Neurological:     General: No focal deficit present.     Mental Status: She is alert and oriented to person, place, and time.      ED Treatments / Results  Labs (all labs ordered are listed, but only abnormal results are displayed) Labs Reviewed  COMPREHENSIVE METABOLIC PANEL - Abnormal; Notable for the following components:      Result Value   Glucose, Bld 194 (*)    BUN 61 (*)    Creatinine, Ser 3.11 (*)    AST 7 (*)    GFR calc non Af Amer 15 (*)    GFR calc Af Amer 18 (*)    All other components within normal limits  CBC - Abnormal; Notable for the following components:   WBC 15.6 (*)    RBC 3.29 (*)    Hemoglobin 7.6 (*)    HCT 25.6 (*)    MCV 77.8 (*)    MCH 23.1 (*)    MCHC 29.7 (*)    RDW 18.6 (*)    All other components within normal limits  BASIC METABOLIC PANEL  CBC  CBC  CREATININE, SERUM  VITAMIN B12  FOLATE  IRON AND TIBC  FERRITIN  RETICULOCYTES  TYPE AND SCREEN  PREPARE RBC (CROSSMATCH)    EKG EKG Interpretation  Date/Time:  Thursday October 09 2018 21:15:20 EST Ventricular Rate:  85 PR Interval:    QRS Duration: 98 QT Interval:  394 QTC Calculation: 469 R Axis:   -54 Text Interpretation:  Sinus rhythm Abnormal R-wave progression, late transition Inferior infarct, old No significant change since last tracing Interpretation limited secondary to artifact Confirmed by Fredia Sorrow [id number]) on 10/09/2018 9:34:52 PM   Radiology Dg Chest 2 View  Result Date: 10/09/2018 CLINICAL  DATA:  Lightheaded EXAM: CHEST - 2 VIEW COMPARISON:  08/15/2018 FINDINGS: Mild cardiomegaly. Low lung volumes. Hazy airspace disease at the right lung base. No pneumothorax or pleural effusion. IMPRESSION: Hazy airspace disease at the right lung base. Electronically Signed   By: Marybelle Killings M.D.   On: 10/09/2018 20:49    Procedures Procedures (including critical care time)  CRITICAL CARE Performed by: Fredia Sorrow Total critical care time: 30 minutes Critical care time was exclusive of separately billable procedures and treating other patients. Critical care was necessary to treat or prevent imminent or life-threatening deterioration. Critical care was time spent personally by me on the following activities: development of treatment plan with patient and/or surrogate as well as nursing, discussions with consultants, evaluation of patient's response to treatment, examination of patient, obtaining history from patient or surrogate, ordering and performing treatments and interventions, ordering and review of laboratory studies, ordering and review of radiographic studies, pulse oximetry and re-evaluation of patient's condition.   Medications Ordered in ED Medications  aspirin EC tablet 81 mg (has no administration in time range)  amLODipine (NORVASC) tablet 10 mg (has no administration in time range)  carvedilol (COREG) tablet 25 mg (has no administration in time range)  hydrALAZINE (APRESOLINE) tablet 100 mg (has no administration in time range)  losartan (COZAAR) tablet 100 mg (has no administration in time range)  rosuvastatin (  CRESTOR) tablet 40 mg (has no administration in time range)  spironolactone (ALDACTONE) tablet 50 mg (has no administration in time range)  busPIRone (BUSPAR) tablet 5 mg (has no administration in time range)  FLUoxetine (PROZAC) capsule 20 mg (has no administration in time range)  Insulin Glargine (LANTUS) Solostar Pen 20 Units (has no administration in time  range)  Hyoscyamine Sulfate SL SUBL 1 tablet (has no administration in time range)  cyclobenzaprine (FLEXERIL) tablet 10 mg (has no administration in time range)  gabapentin (NEURONTIN) tablet 800 mg (has no administration in time range)  potassium chloride (K-DUR,KLOR-CON) CR tablet 10 mEq (has no administration in time range)  albuterol (PROVENTIL HFA;VENTOLIN HFA) 108 (90 Base) MCG/ACT inhaler 1-2 puff (has no administration in time range)  loratadine (CLARITIN) tablet 10 mg (has no administration in time range)  montelukast (SINGULAIR) tablet 10 mg (has no administration in time range)  acetaminophen (TYLENOL) tablet 650 mg (has no administration in time range)    Or  acetaminophen (TYLENOL) suppository 650 mg (has no administration in time range)  ondansetron (ZOFRAN) tablet 4 mg (has no administration in time range)    Or  ondansetron (ZOFRAN) injection 4 mg (has no administration in time range)  insulin aspart (novoLOG) injection 0-9 Units (has no administration in time range)  heparin injection 5,000 Units (has no administration in time range)  0.9 %  sodium chloride infusion (10 mL/hr Intravenous New Bag/Given (Non-Interop) 10/09/18 2122)     Initial Impression / Assessment and Plan / ED Course  I have reviewed the triage vital signs and the nursing notes.  Pertinent labs & imaging results that were available during my care of the patient were reviewed by me and considered in my medical decision making (see chart for details).       Patient hemoglobin is low sevens here today.  Will require 2 unit blood transfusion.  Patient in no acute distress.  Oxygen saturation was 96% blood pressure was 106/55 not tachycardic.  Patient will have EKG added onto her initial work-up and also will have a chest x-ray due to her history of CHF.  2 units of blood transfusion has been ordered and will be gone in the emergency department.  Contacted the hospitalist for admission.  Patient's anemia may  be due to a slow GI blood loss or may be due to her chronic kidney disease.  No significant changes in her creatinines they have been in the 3 range over the last several months.   Final Clinical Impressions(s) / ED Diagnoses   Final diagnoses:  Symptomatic anemia  Stage 4 chronic kidney disease [facility])    ED Discharge Orders    None       Fredia Sorrow, MD 10/09/18 2107    Fredia Sorrow, MD 10/09/18 2135

## 2018-10-09 NOTE — H&P (Signed)
History and Physical    Brenda Sims:[id number] DOB: 10-Jun-1956 DOA: 10/09/2018  PCP: Leonard Downing, MD  Patient coming from: Home.  Chief Complaint: Low hemoglobin.  HPI: Brenda Sims is a 63 y.o. female with history of chronic and disease stage IV, chronic diastolic CHF, diabetes mellitus, sleep apnea, hypertension, CAD was referred to the ER after patient hemoglobin was found to be less than her baseline.  Patient hemoglobin is usually around 8 was found to be 7 and patient was having some exertional dyspnea and dizziness over the last few days.  Denies any chest pain productive cough fever chills nausea vomiting or diarrhea.  Did not see obvious GI bleed.  ED Course: In the ER hemoglobin was confirmed to be around 7.  EKG was showing normal sinus rhythm chest x-ray was showing nonspecific infiltrates.  Has chronic leukocytosis.  2 units of PRBC was ordered and admitted for further management of symptomatic anemia.  Review of Systems: As per HPI, rest all negative.   Past Medical History:  Diagnosis Date  . Anemia   . Anxiety   . CAD (coronary artery disease)   . CHF (congestive heart failure) ([facility])   . CKD (chronic kidney disease)   . COPD (chronic obstructive pulmonary disease) ([place])   . Depression   . Diabetes mellitus without complication ([place])   . Hypertension   . Hypothyroid   . MI (myocardial infarction) ([place])   . Sleep apnea    no cpap , "never gave me one"    Past Surgical History:  Procedure Laterality Date  . APPENDECTOMY    . BACK SURGERY    . LEFT HEART CATH AND CORONARY ANGIOGRAPHY N/A 09/11/2017   Procedure: LEFT HEART CATH AND CORONARY ANGIOGRAPHY;  Surgeon: Martinique, Peter M, MD;  Location: [facility];  Service: Cardiovascular;  Laterality: N/A;  . TOTAL KNEE ARTHROPLASTY Right 2007     reports that she has never smoked. She has never used smokeless tobacco. She reports that she does not drink alcohol or use drugs.  Allergies    Allergen Reactions  . Eggs Or Egg-Derived Products Shortness Of Breath and Rash  . Azithromycin Swelling    Facial/throat swelling  . Cherry Swelling    Facial swelling  . Latex Rash    Family History  Problem Relation Age of Onset  . Hypertension Mother   . Colon cancer Neg Hx   . Esophageal cancer Neg Hx     Prior to Admission medications   Medication Sig Start Date End Date Taking? Authorizing Provider  albuterol (PROVENTIL HFA;VENTOLIN HFA) 108 (90 Base) MCG/ACT inhaler Inhale 1-2 puffs into the lungs every 6 (six) hours as needed for shortness of breath.   Yes [provider]  amLODipine (NORVASC) 10 MG tablet Take 1 tablet (10 mg total) by mouth daily. MUST MAKE APPT FOR FURTHER REFILLS Patient taking differently: Take 10 mg by mouth daily.  06/24/18  Yes Newlin, Charlane Ferretti, MD  busPIRone (BUSPAR) 5 MG tablet Take 1 tablet (5 mg total) by mouth 3 (three) times daily. 01/21/18  Yes Newlin, Charlane Ferretti, MD  carvedilol (COREG) 25 MG tablet TAKE 1 TABLET BY MOUTH 2 (TWO) TIMES DAILY WITH A MEAL. Patient taking differently: Take 25 mg by mouth 2 (two) times daily with a meal.  05/29/18  Yes Newlin, Enobong, MD  cetirizine (ZYRTEC) 10 MG tablet TAKE 1 TABLET (10 MG TOTAL) BY MOUTH DAILY. 03/11/18  Yes Charlott Rakes, MD  cyclobenzaprine (FLEXERIL)  10 MG tablet Take 1 tablet (10 mg total) by mouth 3 (three) times daily as needed for muscle spasms. 06/28/18  Yes Purohit, Konrad Dolores, MD  diphenhydrAMINE (BENADRYL) 25 MG tablet Take 25 mg by mouth every 6 (six) hours as needed for itching or allergies.   Yes [provider]  FLUoxetine (PROZAC) 20 MG capsule Take 20 mg by mouth daily. 05/13/18  Yes [provider]  furosemide (LASIX) 40 MG tablet Take 2 tablets (80 mg total) by mouth every morning AND 1 tablet (40 mg total) every evening. Patient taking differently: Take  (80 mg total) by mouth every morning AND  (40 mg total) at 14:00 02/24/18 02/19/19 Yes Croitoru, Mihai, MD   gabapentin (NEURONTIN) 800 MG tablet Take 800 mg by mouth 3 (three) times daily as needed (pain).  08/30/18  Yes [provider]  hydrALAZINE (APRESOLINE) 100 MG tablet Take 1 tablet (100 mg total) by mouth 3 (three) times daily. 01/21/18  Yes Charlott Rakes, MD  Hyoscyamine Sulfate SL (LEVSIN/SL) 0.125 MG SUBL Place 1 tablet under the tongue every 4 (four) hours as needed. Patient taking differently: Place 1 tablet under the tongue every 4 (four) hours as needed (for abdominal cramping).  05/07/18  Yes Upstill, Nehemiah Settle, PA-C  Insulin Glargine (LANTUS SOLOSTAR) 100 UNIT/ML Solostar Pen Inject 20 Units into the skin 2 (two) times daily. Patient taking differently: Inject 20-30 Units into the skin See admin instructions. Take 30 units in the morning and at noon. Take 20 units with supper 01/21/18  Yes Newlin, Enobong, MD  insulin lispro (HUMALOG KWIKPEN) 100 UNIT/ML KwikPen Inject 4 Units into the skin 3 (three) times daily.   Yes [provider]  losartan (COZAAR) 100 MG tablet Take 100 mg by mouth daily. 05/13/18  Yes [provider]  montelukast (SINGULAIR) 10 MG tablet Take 1 tablet (10 mg total) by mouth at bedtime. 01/21/18  Yes Charlott Rakes, MD  Multiple Vitamin (MULTIVITAMIN) tablet Take 1 tablet by mouth daily.   Yes [provider]  potassium chloride SA (K-DUR,KLOR-CON) 10 MEQ tablet Take 1 tablet (10 mEq total) by mouth daily. 01/21/18  Yes Charlott Rakes, MD  rosuvastatin (CRESTOR) 40 MG tablet Take 1 tablet (40 mg total) by mouth daily. Patient taking differently: Take 40 mg by mouth at bedtime.  01/21/18 10/09/18 Yes Charlott Rakes, MD  spironolactone (ALDACTONE) 50 MG tablet Take 1 tablet (50 mg total) by mouth daily. 01/21/18  Yes Charlott Rakes, MD  ACCU-CHEK SOFTCLIX LANCETS lancets Use as instructed 10/16/17   Brayton Caves, PA-C  aspirin EC 81 MG tablet Take 1 tablet (81 mg total) by mouth daily. Patient taking differently: Take 81 mg by mouth 2  (two) times daily.  10/16/17   Brayton Caves, PA-C  Blood Glucose Monitoring Suppl (ACCU-CHEK AVIVA) device Use as instructed 10/16/17 10/16/18  Brayton Caves, PA-C  Blood Glucose Monitoring Suppl (TRUE METRIX METER) DEVI 1 kit by Does not apply route 4 (four) times daily. 10/16/17   Brayton Caves, PA-C  gabapentin (NEURONTIN) 300 MG capsule Take 1 capsule (300 mg total) by mouth 3 (three) times daily. Patient not taking: Reported on 10/09/2018 01/21/18   Charlott Rakes, MD  glucose blood (ACCU-CHEK AVIVA) test strip Use as instructed 10/16/17   Brayton Caves, PA-C  glucose blood (TRUE METRIX BLOOD GLUCOSE TEST) test strip Use as instructed 10/16/17   Brayton Caves, PA-C  Insulin Pen Needle (TRUEPLUS PEN NEEDLES) 31G X 6 MM MISC Use  as directed 11/08/17   Charlott Rakes, MD  ketoconazole (NIZORAL) 2 % shampoo Daily for the next week then twice a week until resolution Patient not taking: Reported on 10/09/2018 02/17/18   Zigmund Gottron, NP  TRUEPLUS LANCETS 28G MISC 28 g by Does not apply route 4 (four) times daily. 10/16/17   Brayton Caves, PA-C    Physical Exam: Vitals:   10/09/18 1846  BP: (!) 106/55  Pulse: 77  Resp: 16  Temp: 97.7 F (36.5 C)  SpO2: 96%      Constitutional: Moderately built and nourished. Vitals:   10/09/18 1846  BP: (!) 106/55  Pulse: 77  Resp: 16  Temp: 97.7 F (36.5 C)  SpO2: 96%   Eyes: Anicteric no pallor. ENMT: No discharge from the ears eyes nose and mouth. Neck: No mass felt.  No neck rigidity. Respiratory: No rhonchi or crepitations. Cardiovascular: S1-S2 heard. Abdomen: Soft nontender bowel sounds present. Musculoskeletal: No edema. Skin: No rash. Neurologic: Alert awake oriented to time place and person.  Moves all extremities. Psychiatric: Appears normal per normal affect.   Labs on Admission: I have personally reviewed following labs and imaging studies  CBC: Recent Labs  Lab 10/09/18 1932  WBC 15.6*  HGB 7.6*  HCT 25.6*  MCV  77.8*  PLT 758   Basic Metabolic Panel: Recent Labs  Lab 10/09/18 1932  NA 139  K 4.1  CL 108  CO2 23  GLUCOSE 194*  BUN 61*  CREATININE 3.11*  CALCIUM 9.8   GFR: CrCl cannot be calculated (Unknown ideal weight.). Liver Function Tests: Recent Labs  Lab 10/09/18 1932  AST 7*  ALT 13  ALKPHOS 71  BILITOT 0.3  PROT 8.0  ALBUMIN 3.8   No results for input(s): LIPASE, AMYLASE in the last 168 hours. No results for input(s): AMMONIA in the last 168 hours. Coagulation Profile: No results for input(s): INR, PROTIME in the last 168 hours. Cardiac Enzymes: No results for input(s): CKTOTAL, CKMB, CKMBINDEX, TROPONINI in the last 168 hours. BNP (last 3 results) No results for input(s): PROBNP in the last 8760 hours. HbA1C: No results for input(s): HGBA1C in the last 72 hours. CBG: No results for input(s): GLUCAP in the last 168 hours. Lipid Profile: No results for input(s): CHOL, HDL, LDLCALC, TRIG, CHOLHDL, LDLDIRECT in the last 72 hours. Thyroid Function Tests: No results for input(s): TSH, T4TOTAL, FREET4, T3FREE, THYROIDAB in the last 72 hours. Anemia Panel: No results for input(s): VITAMINB12, FOLATE, FERRITIN, TIBC, IRON, RETICCTPCT in the last 72 hours. Urine analysis:    Component Value Date/Time   COLORURINE STRAW (A) 08/14/2018 1037   APPEARANCEUR CLEAR 08/14/2018 1037   LABSPEC 1.010 08/14/2018 1037   PHURINE 6.0 08/14/2018 1037   GLUCOSEU NEGATIVE 08/14/2018 1037   HGBUR NEGATIVE 08/14/2018 1037   BILIRUBINUR NEGATIVE 08/14/2018 1037   KETONESUR NEGATIVE 08/14/2018 1037   PROTEINUR NEGATIVE 08/14/2018 1037   NITRITE NEGATIVE 08/14/2018 1037   LEUKOCYTESUR NEGATIVE 08/14/2018 1037   Sepsis Labs: _0 (procalcitonin:4,lacticidven:4) )No results found for this or any previous visit (from the past 240 hour(s)).   Radiological Exams on Admission: Dg Chest 2 View  Result Date: 10/09/2018 CLINICAL DATA:  Lightheaded EXAM: CHEST - 2 VIEW COMPARISON:   08/15/2018 FINDINGS: Mild cardiomegaly. Low lung volumes. Hazy airspace disease at the right lung base. No pneumothorax or pleural effusion. IMPRESSION: Hazy airspace disease at the right lung base. Electronically Signed   By: Marybelle Killings M.D.   On: 10/09/2018 20:49  EKG: Independently reviewed.  Normal sinus rhythm.  Assessment/Plan Principal Problem:   Symptomatic anemia Active Problems:   Essential hypertension   CKD (chronic kidney disease) stage 4, GFR 15-29 ml/min (HCC)   Coronary artery disease involving native coronary artery of native heart with angina pectoris (HCC)   Type II diabetes mellitus with renal manifestations (HCC)   Chronic diastolic heart failure (HCC)   Sleep apnea    1. Symptomatic anemia -2 units of PRBC transfusion has been ordered follow repeat CBC in the morning.  Advised patient to have a colonoscopy done through primary care physician. 2. Chronic and disease stage IV creatinine appears to be at baseline.  Note that patient is on spironolactone Lasix and ARB. 3. Chronic diastolic CHF last EF measured in July 2019 was 50% with grade 2 diastolic dysfunction.  On Lasix 80 mg in the morning and 40 at night and also takes spironolactone.  Patient also takes ARB.  Closely monitor metabolic panel given the chronic kidney disease. 4. Hypertension on Coreg amlodipine hydralazine Cozaar and spironolactone. 5. CAD on Coreg aspirin statins. 6. Diabetes mellitus type 2 on Lantus 20 units at bedtime with sliding scale coverage. 7. Sleep apnea on CPAP. 8. Chronic leukocytosis.  Note that patient's chest x-ray showing some infiltrates but patient is afebrile does not have any productive cough.  Will check procalcitonin.   DVT prophylaxis: Heparin. Code Status: Full code. Family Communication: Discussed with patient. Disposition Plan: Home. Consults called: None. Admission status: Observation.   Rise Patience MD [facility] Pager [phone].  If  7PM-7AM, please contact night-coverage [email or web address] Password [email or web address]  10/09/2018, 9:21 PM

## 2018-10-10 DIAGNOSIS — D649 Anemia, unspecified: Secondary | ICD-10-CM | POA: Diagnosis not present

## 2018-10-10 LAB — CBC
HEMATOCRIT: 32.3 % — AB (ref 36.0–46.0)
Hemoglobin: 10.1 g/dL — ABNORMAL LOW (ref 12.0–15.0)
MCH: 25 pg — ABNORMAL LOW (ref 26.0–34.0)
MCHC: 31.3 g/dL (ref 30.0–36.0)
MCV: 80 fL (ref 80.0–100.0)
Platelets: 257 10*3/uL (ref 150–400)
RBC: 4.04 MIL/uL (ref 3.87–5.11)
RDW: 18.6 % — ABNORMAL HIGH (ref 11.5–15.5)
WBC: 15.7 10*3/uL — ABNORMAL HIGH (ref 4.0–10.5)
nRBC: 0 % (ref 0.0–0.2)

## 2018-10-10 LAB — GLUCOSE, CAPILLARY
Glucose-Capillary: 171 mg/dL — ABNORMAL HIGH (ref 70–99)
Glucose-Capillary: 171 mg/dL — ABNORMAL HIGH (ref 70–99)

## 2018-10-10 LAB — BASIC METABOLIC PANEL
Anion gap: 10 (ref 5–15)
BUN: 59 mg/dL — ABNORMAL HIGH (ref 8–23)
CO2: 22 mmol/L (ref 22–32)
CREATININE: 2.95 mg/dL — AB (ref 0.44–1.00)
Calcium: 10.1 mg/dL (ref 8.9–10.3)
Chloride: 108 mmol/L (ref 98–111)
GFR calc non Af Amer: 16 mL/min — ABNORMAL LOW (ref >60)
GFR, EST AFRICAN AMERICAN: 19 mL/min — AB (ref >60)
Glucose, Bld: 170 mg/dL — ABNORMAL HIGH (ref 70–99)
Potassium: 4.1 mmol/L (ref 3.5–5.1)
Sodium: 140 mmol/L (ref 135–145)

## 2018-10-10 LAB — TROPONIN I: Troponin I: <0.03 ng/mL (ref <0.03)

## 2018-10-10 LAB — BRAIN NATRIURETIC PEPTIDE: B Natriuretic Peptide: 50.8 pg/mL (ref 0.0–100.0)

## 2018-10-10 LAB — PROCALCITONIN: Procalcitonin: 0.2 ng/mL

## 2018-10-10 MED ORDER — ALUM & MAG HYDROXIDE-SIMETH 200-200-20 MG/5ML PO SUSP
30.0000 mL | Freq: Four times a day (QID) | ORAL | Status: DC | PRN
  Administered 2018-10-10: 30 mL via ORAL
  Filled 2018-10-10: qty 30

## 2018-10-10 NOTE — Progress Notes (Signed)
Pt to be discharged to home this afternoon. Discharge teaching including review of Medications and Schedules. Pt verbalized understanding of all discharge teaching. Discharge packet with pt at time of discharge.

## 2018-10-10 NOTE — Discharge Summary (Signed)
Physician Discharge Summary  Brenda Sims IWP:[id number] DOB: 30-May-1956 DOA: 10/09/2018  PCP: Leonard Downing, MD  Admit date: 10/09/2018 Discharge date: 10/10/2018  Admitted From: Home Discharge disposition: Home   Code Status: Full Code   Recommendations for Outpatient Follow-Up:   1. Follow-up with GI as an outpatient  Discharge Diagnosis:   Active Problems:   Essential hypertension   CKD (chronic kidney disease) stage 4, GFR 15-29 ml/min (HCC)   Coronary artery disease involving native coronary artery of native heart with angina pectoris (HCC)   Type II diabetes mellitus with renal manifestations (HCC)   Chronic diastolic heart failure (HCC)   Sleep apnea    History of Present Illness / Brief narrative:  Brenda Sims is a 63 y.o. female with history of chronic and disease stage IV, chronic diastolic CHF, diabetes mellitus, sleep apnea, hypertension, CAD was referred to the ER after patient hemoglobin was found to be less than her baseline.  Patient's hemoglobin is usually around 8 was found to be 7 and patient was having some exertional dyspnea and dizziness over the last few days.  Denies any chest pain productive cough fever chills nausea vomiting or diarrhea.  Did not see obvious GI bleed.  ED Course: In the ER hemoglobin was confirmed to be around 7.  EKG was showing normal sinus rhythm chest x-ray was showing nonspecific infiltrates.  Has chronic leukocytosis.  2 units of PRBC was ordered and admitted for further management of symptomatic anemia.  Hospital Course:  Symptomatic anemia -2 units of PRBCs transfused this morning.  Hemoglobin improved to 10.1 posttransfusion.   Her anemia is most likely secondary to chronic kidney disease.  Patient also states to me that she has never had a GI evaluation.  I ordered for an ambulatory referral to GI as an outpatient.    CKD stage IV -continue to follow-up with nephrologist.  Creatinine at baseline.  CHF -not in  exacerbation.  Last EF measured in July 2019 was 50% with grade 2 diastolic dysfunction.  On Lasix 80 mg in the morning and 40 at night and also takes spironolactone.  Patient also takes ARB.   Hypertension -controlled on Coreg, amlodipine, hydralazine, Cozaar and spironolactone.  CAD - on Coreg aspirin statins.  Diabetes mellitus type 2 on Lantus 20 units at bedtime with sliding scale coverage.  Sleep apnea on CPAP.  Chronic leukocytosis.  Medical Consultants:    None   Discharge Exam:   Vitals:   10/10/18 0316 10/10/18 0633 10/10/18 0653 10/10/18 0955  BP: (!) 102/46  (!) 116/59 115/62  Pulse: 89  83   Resp: 20  18   Temp: 99.1 F (37.3 C)  98.9 F (37.2 C)   TempSrc: Oral  Oral   SpO2: 96%  94%   Weight:  92.4 kg    Height:  5' 4.5" (1.638 m)      Body mass index is 34.43 kg/m.  General exam: Appears calm and comfortable.  Skin: No rashes, lesions or ulcers. Respiratory system: Middle-aged Caucasian female.  Not in distress Cardiovascular system: Regular rate and rhythm, no murmur Gastrointestinal system: Soft abdomen, nondistended, nontender, bowel sound present Central nervous system: Alert, awake oriented x3 Psychiatry: Mood & affect appropriate.  Extremities: No pedal edema, no calf tenderness   Discharge Instructions:  Wound care: None Discharge Instructions    Ambulatory referral to Gastroenterology   Complete by:  As directed    Needs anemia evaluation   Diet - low sodium  heart healthy   Complete by:  As directed    Increase activity slowly   Complete by:  As directed       Allergies as of 10/10/2018      Reactions   Eggs Or Egg-derived Products Shortness Of Breath, Rash   Azithromycin Swelling   Facial/throat swelling   Cherry Swelling   Facial swelling   Latex Rash      Medication List    STOP taking these medications   ketoconazole 2 % shampoo Commonly known as:  NIZORAL     TAKE these medications   albuterol 108 (90 Base)  MCG/ACT inhaler Commonly known as:  PROVENTIL HFA;VENTOLIN HFA Inhale 1-2 puffs into the lungs every 6 (six) hours as needed for shortness of breath.   amLODipine 10 MG tablet Commonly known as:  NORVASC Take 1 tablet (10 mg total) by mouth daily. MUST MAKE APPT FOR FURTHER REFILLS What changed:  additional instructions   aspirin EC 81 MG tablet Take 1 tablet (81 mg total) by mouth daily. What changed:  when to take this   busPIRone 5 MG tablet Commonly known as:  BUSPAR Take 1 tablet (5 mg total) by mouth 3 (three) times daily.   carvedilol 25 MG tablet Commonly known as:  COREG TAKE 1 TABLET BY MOUTH 2 (TWO) TIMES DAILY WITH A MEAL. What changed:  See the new instructions.   cetirizine 10 MG tablet Commonly known as:  ZYRTEC TAKE 1 TABLET (10 MG TOTAL) BY MOUTH DAILY.   cyclobenzaprine 10 MG tablet Commonly known as:  FLEXERIL Take 1 tablet (10 mg total) by mouth 3 (three) times daily as needed for muscle spasms.   diphenhydrAMINE 25 MG tablet Commonly known as:  BENADRYL Take 25 mg by mouth every 6 (six) hours as needed for itching or allergies.   FLUoxetine 20 MG capsule Commonly known as:  PROZAC Take 20 mg by mouth daily.   furosemide 40 MG tablet Commonly known as:  LASIX Take 2 tablets (80 mg total) by mouth every morning AND 1 tablet (40 mg total) every evening. What changed:  See the new instructions.   gabapentin 300 MG capsule Commonly known as:  NEURONTIN Take 1 capsule (300 mg total) by mouth 3 (three) times daily.   gabapentin 800 MG tablet Commonly known as:  NEURONTIN Take 800 mg by mouth 3 (three) times daily as needed (pain).   glucose blood test strip Commonly known as:  TRUE METRIX BLOOD GLUCOSE TEST Use as instructed   glucose blood test strip Commonly known as:  ACCU-CHEK AVIVA Use as instructed   HUMALOG KWIKPEN 100 UNIT/ML KwikPen Generic drug:  insulin lispro Inject 4 Units into the skin 3 (three) times daily.   hydrALAZINE 100  MG tablet Commonly known as:  APRESOLINE Take 1 tablet (100 mg total) by mouth 3 (three) times daily.   Hyoscyamine Sulfate SL 0.125 MG Subl Commonly known as:  LEVSIN/SL Place 1 tablet under the tongue every 4 (four) hours as needed. What changed:  reasons to take this   Insulin Glargine 100 UNIT/ML Solostar Pen Commonly known as:  LANTUS SOLOSTAR Inject 20 Units into the skin 2 (two) times daily. What changed:    how much to take  when to take this  additional instructions   Insulin Pen Needle 31G X 6 MM Misc Commonly known as:  TRUEPLUS PEN NEEDLES Use as directed   losartan 100 MG tablet Commonly known as:  COZAAR Take 100 mg by mouth  daily.   montelukast 10 MG tablet Commonly known as:  SINGULAIR Take 1 tablet (10 mg total) by mouth at bedtime.   multivitamin tablet Take 1 tablet by mouth daily.   potassium chloride 10 MEQ tablet Commonly known as:  K-DUR,KLOR-CON Take 1 tablet (10 mEq total) by mouth daily.   rosuvastatin 40 MG tablet Commonly known as:  CRESTOR Take 1 tablet (40 mg total) by mouth daily. What changed:  when to take this   spironolactone 50 MG tablet Commonly known as:  ALDACTONE Take 1 tablet (50 mg total) by mouth daily.   TRUE METRIX METER Devi 1 kit by Does not apply route 4 (four) times daily.   ACCU-CHEK AVIVA device Use as instructed   TRUEPLUS LANCETS 28G Misc 28 g by Does not apply route 4 (four) times daily.   ACCU-CHEK SOFTCLIX LANCETS lancets Use as instructed       Time coordinating discharge: 20 minutes  The results of significant diagnostics from this hospitalization (including imaging, microbiology, ancillary and laboratory) are listed below for reference.    Procedures and Diagnostic Studies:   Dg Chest 2 View  Result Date: 10/09/2018 CLINICAL DATA:  Lightheaded EXAM: CHEST - 2 VIEW COMPARISON:  08/15/2018 FINDINGS: Mild cardiomegaly. Low lung volumes. Hazy airspace disease at the right lung base. No  pneumothorax or pleural effusion. IMPRESSION: Hazy airspace disease at the right lung base. Electronically Signed   By: Marybelle Killings M.D.   On: 10/09/2018 20:49     Labs:   Basic Metabolic Panel: Recent Labs  Lab 10/09/18 1932 10/10/18 0908  NA 139 140  K 4.1 4.1  CL 108 108  CO2 23 22  GLUCOSE 194* 170*  BUN 61* 59*  CREATININE 3.11* 2.95*  CALCIUM 9.8 10.1   GFR Estimated Creatinine Clearance: 22 mL/min (A) (by C-G formula based on SCr of 2.95 mg/dL (H)). Liver Function Tests: Recent Labs  Lab 10/09/18 1932  AST 7*  ALT 13  ALKPHOS 71  BILITOT 0.3  PROT 8.0  ALBUMIN 3.8   No results for input(s): LIPASE, AMYLASE in the last 168 hours. No results for input(s): AMMONIA in the last 168 hours. Coagulation profile No results for input(s): INR, PROTIME in the last 168 hours.  CBC: Recent Labs  Lab 10/09/18 1932 10/09/18 2130 10/10/18 0908  WBC 15.6* 16.7* 15.7*  HGB 7.6* 7.0* 10.1*  HCT 25.6* 23.6* 32.3*  MCV 77.8* 77.6* 80.0  PLT 291 267 257   Cardiac Enzymes: Recent Labs  Lab 10/10/18 0908  TROPONINI <0.03   BNP: Invalid input(s): POCBNP CBG: Recent Labs  Lab 10/10/18 0757  GLUCAP 171*   D-Dimer No results for input(s): DDIMER in the last 72 hours. Hgb A1c No results for input(s): HGBA1C in the last 72 hours. Lipid Profile No results for input(s): CHOL, HDL, LDLCALC, TRIG, CHOLHDL, LDLDIRECT in the last 72 hours. Thyroid function studies No results for input(s): TSH, T4TOTAL, T3FREE, THYROIDAB in the last 72 hours.  Invalid input(s): FREET3 Anemia work up [employer]    10/09/18 2130  VITAMINB12 491  FOLATE 13.2  FERRITIN 34  TIBC 406  IRON 150  RETICCTPCT 3.5*   Microbiology No results found for this or any previous visit (from the past 240 hour(s)).  Signed: Terrilee Croak  [facility] 10/10/2018, 11:34 AM

## 2018-10-10 NOTE — Discharge Instructions (Signed)
Follow-up with GI as an outpatient

## 2018-10-11 LAB — TYPE AND SCREEN
ABO/RH(D): A POS
Antibody Screen: NEGATIVE
UNIT DIVISION: 0
Unit division: 0

## 2018-10-11 LAB — BPAM RBC
Blood Product Expiration Date: 202003172359
Blood Product Expiration Date: 202003182359
ISSUE DATE / TIME: 202002272305
ISSUE DATE / TIME: 202002280253
Unit Type and Rh: 6200
Unit Type and Rh: 6200

## 2018-10-23 ENCOUNTER — Other Ambulatory Visit: Payer: Self-pay

## 2018-10-23 ENCOUNTER — Ambulatory Visit: Payer: Medicaid Other | Admitting: Physician Assistant

## 2018-12-23 ENCOUNTER — Telehealth: Payer: Self-pay | Admitting: *Deleted

## 2018-12-23 NOTE — Telephone Encounter (Signed)
A message was left, re: follow up visit. 

## 2019-01-16 ENCOUNTER — Emergency Department (HOSPITAL_COMMUNITY): Payer: Medicaid Other

## 2019-01-16 ENCOUNTER — Observation Stay (HOSPITAL_COMMUNITY)
Admission: EM | Admit: 2019-01-16 | Discharge: 2019-01-17 | Disposition: A | Discharge status: Home / Self Care | Payer: Medicaid Other | Attending: Internal Medicine | Admitting: Internal Medicine

## 2019-01-16 ENCOUNTER — Encounter (HOSPITAL_COMMUNITY): Payer: Self-pay

## 2019-01-16 ENCOUNTER — Other Ambulatory Visit: Payer: Self-pay

## 2019-01-16 DIAGNOSIS — D509 Iron deficiency anemia, unspecified: Secondary | ICD-10-CM | POA: Diagnosis not present

## 2019-01-16 DIAGNOSIS — I5043 Acute on chronic combined systolic (congestive) and diastolic (congestive) heart failure: Secondary | ICD-10-CM | POA: Diagnosis not present

## 2019-01-16 DIAGNOSIS — F419 Anxiety disorder, unspecified: Secondary | ICD-10-CM | POA: Insufficient documentation

## 2019-01-16 DIAGNOSIS — E785 Hyperlipidemia, unspecified: Secondary | ICD-10-CM | POA: Diagnosis not present

## 2019-01-16 DIAGNOSIS — Z8674 Personal history of sudden cardiac arrest: Secondary | ICD-10-CM | POA: Diagnosis not present

## 2019-01-16 DIAGNOSIS — E039 Hypothyroidism, unspecified: Secondary | ICD-10-CM | POA: Diagnosis not present

## 2019-01-16 DIAGNOSIS — R0789 Other chest pain: Secondary | ICD-10-CM

## 2019-01-16 DIAGNOSIS — Z1159 Encounter for screening for other viral diseases: Secondary | ICD-10-CM | POA: Insufficient documentation

## 2019-01-16 DIAGNOSIS — G4730 Sleep apnea, unspecified: Secondary | ICD-10-CM | POA: Diagnosis not present

## 2019-01-16 DIAGNOSIS — E1122 Type 2 diabetes mellitus with diabetic chronic kidney disease: Secondary | ICD-10-CM | POA: Diagnosis not present

## 2019-01-16 DIAGNOSIS — Z794 Long term (current) use of insulin: Secondary | ICD-10-CM | POA: Diagnosis not present

## 2019-01-16 DIAGNOSIS — N184 Chronic kidney disease, stage 4 (severe): Secondary | ICD-10-CM | POA: Diagnosis present

## 2019-01-16 DIAGNOSIS — E876 Hypokalemia: Secondary | ICD-10-CM | POA: Insufficient documentation

## 2019-01-16 DIAGNOSIS — Z7982 Long term (current) use of aspirin: Secondary | ICD-10-CM | POA: Diagnosis not present

## 2019-01-16 DIAGNOSIS — Z8249 Family history of ischemic heart disease and other diseases of the circulatory system: Secondary | ICD-10-CM | POA: Insufficient documentation

## 2019-01-16 DIAGNOSIS — G894 Chronic pain syndrome: Secondary | ICD-10-CM | POA: Diagnosis not present

## 2019-01-16 DIAGNOSIS — Z7951 Long term (current) use of inhaled steroids: Secondary | ICD-10-CM | POA: Diagnosis not present

## 2019-01-16 DIAGNOSIS — IMO0001 Reserved for inherently not codable concepts without codable children: Secondary | ICD-10-CM | POA: Diagnosis present

## 2019-01-16 DIAGNOSIS — I130 Hypertensive heart and chronic kidney disease with heart failure and stage 1 through stage 4 chronic kidney disease, or unspecified chronic kidney disease: Secondary | ICD-10-CM | POA: Insufficient documentation

## 2019-01-16 DIAGNOSIS — I252 Old myocardial infarction: Secondary | ICD-10-CM | POA: Insufficient documentation

## 2019-01-16 DIAGNOSIS — R079 Chest pain, unspecified: Principal | ICD-10-CM | POA: Diagnosis present

## 2019-01-16 DIAGNOSIS — I25119 Atherosclerotic heart disease of native coronary artery with unspecified angina pectoris: Secondary | ICD-10-CM | POA: Insufficient documentation

## 2019-01-16 DIAGNOSIS — F329 Major depressive disorder, single episode, unspecified: Secondary | ICD-10-CM | POA: Insufficient documentation

## 2019-01-16 DIAGNOSIS — Z79899 Other long term (current) drug therapy: Secondary | ICD-10-CM | POA: Diagnosis not present

## 2019-01-16 DIAGNOSIS — J40 Bronchitis, not specified as acute or chronic: Secondary | ICD-10-CM | POA: Diagnosis present

## 2019-01-16 DIAGNOSIS — I10 Essential (primary) hypertension: Secondary | ICD-10-CM | POA: Diagnosis present

## 2019-01-16 DIAGNOSIS — J849 Interstitial pulmonary disease, unspecified: Secondary | ICD-10-CM | POA: Insufficient documentation

## 2019-01-16 LAB — BRAIN NATRIURETIC PEPTIDE: B Natriuretic Peptide: 45.5 pg/mL (ref 0.0–100.0)

## 2019-01-16 LAB — BASIC METABOLIC PANEL
Anion gap: 9 (ref 5–15)
BUN: 57 mg/dL — ABNORMAL HIGH (ref 8–23)
CO2: 24 mmol/L (ref 22–32)
Calcium: 9.4 mg/dL (ref 8.9–10.3)
Chloride: 104 mmol/L (ref 98–111)
Creatinine, Ser: 2.85 mg/dL — ABNORMAL HIGH (ref 0.44–1.00)
GFR calc Af Amer: 20 mL/min — ABNORMAL LOW (ref >60)
GFR calc non Af Amer: 17 mL/min — ABNORMAL LOW (ref >60)
Glucose, Bld: 287 mg/dL — ABNORMAL HIGH (ref 70–99)
Potassium: 4.5 mmol/L (ref 3.5–5.1)
Sodium: 137 mmol/L (ref 135–145)

## 2019-01-16 LAB — TROPONIN I
Troponin I: <0.03 ng/mL (ref <0.03)
Troponin I: <0.03 ng/mL (ref <0.03)

## 2019-01-16 LAB — HEMOGLOBIN A1C
Hgb A1c MFr Bld: 9.3 % — ABNORMAL HIGH (ref 4.8–5.6)
Mean Plasma Glucose: 220.21 mg/dL

## 2019-01-16 LAB — CBC
HCT: 35.1 % — ABNORMAL LOW (ref 36.0–46.0)
Hemoglobin: 10.8 g/dL — ABNORMAL LOW (ref 12.0–15.0)
MCH: 25.8 pg — ABNORMAL LOW (ref 26.0–34.0)
MCHC: 30.8 g/dL (ref 30.0–36.0)
MCV: 83.8 fL (ref 80.0–100.0)
Platelets: 244 10*3/uL (ref 150–400)
RBC: 4.19 MIL/uL (ref 3.87–5.11)
RDW: 18.4 % — ABNORMAL HIGH (ref 11.5–15.5)
WBC: 12 10*3/uL — ABNORMAL HIGH (ref 4.0–10.5)
nRBC: 0 % (ref 0.0–0.2)

## 2019-01-16 LAB — GLUCOSE, CAPILLARY
Glucose-Capillary: 189 mg/dL — ABNORMAL HIGH (ref 70–99)
Glucose-Capillary: 307 mg/dL — ABNORMAL HIGH (ref 70–99)

## 2019-01-16 LAB — SARS CORONAVIRUS 2 BY RT PCR (HOSPITAL ORDER, PERFORMED IN ~~LOC~~ HOSPITAL LAB): SARS Coronavirus 2: NEGATIVE

## 2019-01-16 MED ORDER — NITROGLYCERIN 0.4 MG SL SUBL: 0.4000 mg | SUBLINGUAL_TABLET | SUBLINGUAL | Status: DC | PRN

## 2019-01-16 MED ORDER — INSULIN LISPRO (1 UNIT DIAL) 100 UNIT/ML (KWIKPEN): 4.0000 [IU] | PEN_INJECTOR | Freq: Three times a day (TID) | SUBCUTANEOUS | Status: DC

## 2019-01-16 MED ORDER — INSULIN ASPART 100 UNIT/ML ~~LOC~~ SOLN
0.0000 [IU] | Freq: Three times a day (TID) | SUBCUTANEOUS | Status: DC
  Administered 2019-01-16 – 2019-01-17 (×2): 3 [IU] via SUBCUTANEOUS
  Administered 2019-01-17: 15 [IU] via SUBCUTANEOUS

## 2019-01-16 MED ORDER — OXYCODONE-ACETAMINOPHEN 7.5-325 MG PO TABS
1.0000 | ORAL_TABLET | Freq: Four times a day (QID) | ORAL | Status: DC | PRN
  Administered 2019-01-16: 1 via ORAL
  Filled 2019-01-16: qty 1

## 2019-01-16 MED ORDER — HEPARIN SODIUM (PORCINE) 5000 UNIT/ML IJ SOLN
5000.0000 [IU] | Freq: Three times a day (TID) | INTRAMUSCULAR | Status: DC
  Administered 2019-01-16 – 2019-01-17 (×3): 5000 [IU] via SUBCUTANEOUS
  Filled 2019-01-16 (×3): qty 1

## 2019-01-16 MED ORDER — FUROSEMIDE 40 MG PO TABS
40.0000 mg | ORAL_TABLET | Freq: Two times a day (BID) | ORAL | Status: DC
  Administered 2019-01-16 – 2019-01-17 (×2): 40 mg via ORAL
  Filled 2019-01-16 (×2): qty 1

## 2019-01-16 MED ORDER — SODIUM CHLORIDE 0.9% FLUSH: 3.0000 mL | Freq: Once | INTRAVENOUS | Status: DC

## 2019-01-16 MED ORDER — INSULIN ASPART 100 UNIT/ML ~~LOC~~ SOLN
0.0000 [IU] | Freq: Every day | SUBCUTANEOUS | Status: DC
  Administered 2019-01-16: 4 [IU] via SUBCUTANEOUS

## 2019-01-16 MED ORDER — FERROUS SULFATE 325 (65 FE) MG PO TABS
325.0000 mg | ORAL_TABLET | Freq: Three times a day (TID) | ORAL | Status: DC
  Administered 2019-01-16 – 2019-01-17 (×2): 325 mg via ORAL
  Filled 2019-01-16 (×2): qty 1

## 2019-01-16 MED ORDER — FLUOXETINE HCL 20 MG PO CAPS
20.0000 mg | ORAL_CAPSULE | Freq: Every day | ORAL | Status: DC
  Administered 2019-01-16: 20 mg via ORAL
  Filled 2019-01-16: qty 1

## 2019-01-16 MED ORDER — BUSPIRONE HCL 10 MG PO TABS
5.0000 mg | ORAL_TABLET | Freq: Three times a day (TID) | ORAL | Status: DC
  Administered 2019-01-16 – 2019-01-17 (×3): 5 mg via ORAL
  Filled 2019-01-16 (×3): qty 1

## 2019-01-16 MED ORDER — GABAPENTIN 300 MG PO CAPS
300.0000 mg | ORAL_CAPSULE | Freq: Three times a day (TID) | ORAL | Status: DC
  Administered 2019-01-16 – 2019-01-17 (×2): 300 mg via ORAL
  Filled 2019-01-16 (×2): qty 1

## 2019-01-16 MED ORDER — ROSUVASTATIN CALCIUM 20 MG PO TABS
40.0000 mg | ORAL_TABLET | Freq: Every day | ORAL | Status: DC
  Administered 2019-01-16: 40 mg via ORAL
  Filled 2019-01-16: qty 2

## 2019-01-16 MED ORDER — ASPIRIN EC 81 MG PO TBEC
81.0000 mg | DELAYED_RELEASE_TABLET | Freq: Every day | ORAL | Status: DC
  Administered 2019-01-16 – 2019-01-17 (×2): 81 mg via ORAL
  Filled 2019-01-16 (×2): qty 1

## 2019-01-16 MED ORDER — INSULIN GLARGINE 100 UNIT/ML ~~LOC~~ SOLN
20.0000 [IU] | Freq: Two times a day (BID) | SUBCUTANEOUS | Status: DC
  Administered 2019-01-16: 20 [IU] via SUBCUTANEOUS
  Filled 2019-01-16 (×2): qty 0.2

## 2019-01-16 MED ORDER — INSULIN ASPART 100 UNIT/ML ~~LOC~~ SOLN
4.0000 [IU] | Freq: Three times a day (TID) | SUBCUTANEOUS | Status: DC
  Administered 2019-01-17: 4 [IU] via SUBCUTANEOUS

## 2019-01-16 NOTE — ED Provider Notes (Signed)
[facility] DEPT Provider Note   CSN: [id number] Arrival date & time: 01/16/19  1149    History   Chief Complaint Chief Complaint  Patient presents with  . Chest Pain    HPI Brenda Sims is a 63 y.o. female.     HPI Patient presents with chest pain.  States is on her left chest below her left breast.  Dull.  Thinks it feels like her previous MI.  Has been going constantly since yesterday.  States recently however that she has had more shortness of breath with exertion.  No diaphoresis.  No cough.  Recent admission to the hospital for anemia.  States she feels if her hemoglobin could be lower.  Does not know if she has had black stools and states she does not understand the question.  Mild swelling in both her legs.  Patient states the pain radiates down her left arm also.  Patient states she saw her PCP today and was told to come into the ER. Past Medical History:  Diagnosis Date  . Anemia   . Anxiety   . CAD (coronary artery disease)   . CHF (congestive heart failure) ([place])   . CKD (chronic kidney disease)   . COPD (chronic obstructive pulmonary disease) ([place])   . Depression   . Diabetes mellitus without complication ([place])   . Hypertension   . Hypothyroid   . MI (myocardial infarction) ([place])   . Sleep apnea    no cpap , "never gave me one"    Patient Active Problem List   Diagnosis Date Noted  . Community acquired pneumonia 08/13/2018  . Upper airway cough syndrome 07/04/2018  . CHF exacerbation ([place]) 06/27/2018  . Left shoulder pain 06/27/2018  . COPD exacerbation ([place]) 03/05/2018  . CAD (coronary artery disease) 03/05/2018  . Iron deficiency anemia 02/26/2018  . Sleep apnea 11/01/2017  . HLD (hyperlipidemia) 10/19/2017  . Anxiety and depression 10/19/2017  . Chest pain 10/19/2017  . Hypoglycemia   . Hypoxia   . Bronchitis 10/07/2017  . Chronic diastolic heart failure ([place]) 10/07/2017  . Acute renal failure superimposed on stage  3 chronic kidney disease ([place]) 10/07/2017  . Hypokalemia 10/07/2017  . Normocytic normochromic anemia 10/07/2017  . Type II diabetes mellitus with renal manifestations ([place])   . Leukocytosis   . Acute blood loss anemia   . Seizures ([place])   . Coronary artery disease involving native coronary artery of native heart with angina pectoris ([place]) 09/05/2017  . History of cardiac arrest 09/03/2017  . Cardiopulmonary arrest ([place])   . Acute encephalopathy   . Acute respiratory failure with hypoxia ([place])   . Acute on chronic combined systolic and diastolic CHF (congestive heart failure) ([place]) 08/29/2017  . Essential hypertension 08/29/2017  . Type 1 diabetes mellitus without complication ([place]) 18/84/1660  . Tobacco abuse 08/29/2017  . Acute pulmonary edema (HCC)   . CKD (chronic kidney disease) stage 4, GFR 15-29 ml/min (HCC)   . Dyspnea on exertion     Past Surgical History:  Procedure Laterality Date  . APPENDECTOMY    . BACK SURGERY    . LEFT HEART CATH AND CORONARY ANGIOGRAPHY N/A 09/11/2017   Procedure: LEFT HEART CATH AND CORONARY ANGIOGRAPHY;  Surgeon: Martinique, Peter M, MD;  Location: [facility];  Service: Cardiovascular;  Laterality: N/A;  . TOTAL KNEE ARTHROPLASTY Right 2007     OB History   No obstetric history on file.      Home  Medications    Prior to Admission medications   Medication Sig Start Date End Date Taking? Authorizing Provider  ACCU-CHEK SOFTCLIX LANCETS lancets Use as instructed 10/16/17   Brayton Caves, PA-C  albuterol (PROVENTIL HFA;VENTOLIN HFA) 108 (90 Base) MCG/ACT inhaler Inhale 1-2 puffs into the lungs every 6 (six) hours as needed for shortness of breath.    [provider]  amLODipine (NORVASC) 10 MG tablet Take 1 tablet (10 mg total) by mouth daily. MUST MAKE APPT FOR FURTHER REFILLS Patient taking differently: Take 10 mg by mouth daily.  06/24/18   Charlott Rakes, MD  aspirin EC 81 MG tablet Take 1 tablet (81 mg total) by mouth  daily. Patient taking differently: Take 81 mg by mouth 2 (two) times daily.  10/16/17   Brayton Caves, PA-C  Blood Glucose Monitoring Suppl (TRUE METRIX METER) DEVI 1 kit by Does not apply route 4 (four) times daily. 10/16/17   Brayton Caves, PA-C  busPIRone (BUSPAR) 5 MG tablet Take 1 tablet (5 mg total) by mouth 3 (three) times daily. 01/21/18   Charlott Rakes, MD  carvedilol (COREG) 25 MG tablet TAKE 1 TABLET BY MOUTH 2 (TWO) TIMES DAILY WITH A MEAL. Patient taking differently: Take 25 mg by mouth 2 (two) times daily with a meal.  05/29/18   Charlott Rakes, MD  cetirizine (ZYRTEC) 10 MG tablet TAKE 1 TABLET (10 MG TOTAL) BY MOUTH DAILY. 03/11/18   Charlott Rakes, MD  cyclobenzaprine (FLEXERIL) 10 MG tablet Take 1 tablet (10 mg total) by mouth 3 (three) times daily as needed for muscle spasms. 06/28/18   Purohit, Konrad Dolores, MD  diphenhydrAMINE (BENADRYL) 25 MG tablet Take 25 mg by mouth every 6 (six) hours as needed for itching or allergies.    [provider]  FLUoxetine (PROZAC) 20 MG capsule Take 20 mg by mouth daily. 05/13/18   [provider]  furosemide (LASIX) 40 MG tablet Take 2 tablets (80 mg total) by mouth every morning AND 1 tablet (40 mg total) every evening. Patient taking differently: Take  (80 mg total) by mouth every morning AND  (40 mg total) at 14:00 02/24/18 02/19/19  Croitoru, Mihai, MD  gabapentin (NEURONTIN) 300 MG capsule Take 1 capsule (300 mg total) by mouth 3 (three) times daily. Patient not taking: Reported on 10/09/2018 01/21/18   Charlott Rakes, MD  gabapentin (NEURONTIN) 800 MG tablet Take 800 mg by mouth 3 (three) times daily as needed (pain).  08/30/18   [provider]  glucose blood (ACCU-CHEK AVIVA) test strip Use as instructed 10/16/17   Brayton Caves, PA-C  glucose blood (TRUE METRIX BLOOD GLUCOSE TEST) test strip Use as instructed 10/16/17   Brayton Caves, PA-C  hydrALAZINE (APRESOLINE) 100 MG tablet Take 1 tablet (100 mg total) by mouth 3  (three) times daily. 01/21/18   Charlott Rakes, MD  Hyoscyamine Sulfate SL (LEVSIN/SL) 0.125 MG SUBL Place 1 tablet under the tongue every 4 (four) hours as needed. Patient taking differently: Place 1 tablet under the tongue every 4 (four) hours as needed (for abdominal cramping).  05/07/18   Charlann Lange, PA-C  Insulin Glargine (LANTUS SOLOSTAR) 100 UNIT/ML Solostar Pen Inject 20 Units into the skin 2 (two) times daily. Patient taking differently: Inject 20-30 Units into the skin See admin instructions. Take 30 units in the morning and at noon. Take 20 units with supper 01/21/18   Charlott Rakes, MD  insulin lispro (HUMALOG KWIKPEN) 100 UNIT/ML KwikPen Inject 4 Units into  the skin 3 (three) times daily.    [provider]  Insulin Pen Needle (TRUEPLUS PEN NEEDLES) 31G X 6 MM MISC Use as directed 11/08/17   Charlott Rakes, MD  losartan (COZAAR) 100 MG tablet Take 100 mg by mouth daily. 05/13/18   [provider]  montelukast (SINGULAIR) 10 MG tablet Take 1 tablet (10 mg total) by mouth at bedtime. 01/21/18   Charlott Rakes, MD  Multiple Vitamin (MULTIVITAMIN) tablet Take 1 tablet by mouth daily.    [provider]  potassium chloride SA (K-DUR,KLOR-CON) 10 MEQ tablet Take 1 tablet (10 mEq total) by mouth daily. 01/21/18   Charlott Rakes, MD  rosuvastatin (CRESTOR) 40 MG tablet Take 1 tablet (40 mg total) by mouth daily. Patient taking differently: Take 40 mg by mouth at bedtime.  01/21/18 10/09/18  Charlott Rakes, MD  spironolactone (ALDACTONE) 50 MG tablet Take 1 tablet (50 mg total) by mouth daily. 01/21/18   Charlott Rakes, MD  TRUEPLUS LANCETS 28G MISC 28 g by Does not apply route 4 (four) times daily. 10/16/17   Brayton Caves, PA-C    Family History Family History  Problem Relation Age of Onset  . Hypertension Mother   . Colon cancer Neg Hx   . Esophageal cancer Neg Hx     Social History Social History   Tobacco Use  . Smoking status: Never Smoker  .  Smokeless tobacco: Never Used  Substance Use Topics  . Alcohol use: No    Frequency: Never  . Drug use: No     Allergies   Eggs or egg-derived products; Azithromycin; Cherry; and Latex   Review of Systems Review of Systems  Constitutional: Negative for appetite change.  HENT: Negative for congestion.   Respiratory: Positive for shortness of breath.   Cardiovascular: Positive for chest pain and leg swelling.  Gastrointestinal: Negative for abdominal pain and blood in stool.  Genitourinary: Negative for flank pain.  Musculoskeletal: Negative for back pain.  Skin: Negative for rash.  Neurological: Negative for weakness.     Physical Exam Updated Vital Signs BP 118/69   Pulse 77   Temp 98.7 F (37.1 C) (Oral)   Resp 19   Ht '5\' 4"'  (1.626 m)   Wt 91.6 kg   SpO2 91%   BMI 34.67 kg/m   Physical Exam Vitals signs and nursing note reviewed.  HENT:     Head: Normocephalic.  Cardiovascular:     Rate and Rhythm: Regular rhythm.  Pulmonary:     Comments: No rash to left chest wall but may have some mild tenderness. Abdominal:     Tenderness: There is no abdominal tenderness.  Musculoskeletal:     Comments: edema bilateral lower extremities.  Skin:    General: Skin is warm.     Capillary Refill: Capillary refill takes less than 2 seconds.  Neurological:     Mental Status: She is alert.      ED Treatments / Results  Labs (all labs ordered are listed, but only abnormal results are displayed) Labs Reviewed  BASIC METABOLIC PANEL - Abnormal; Notable for the following components:      Result Value   Glucose, Bld 287 (*)    BUN 57 (*)    Creatinine, Ser 2.85 (*)    GFR calc non Af Amer 17 (*)    GFR calc Af Amer 20 (*)    All other components within normal limits  CBC - Abnormal; Notable for the following components:   WBC  12.0 (*)    Hemoglobin 10.8 (*)    HCT 35.1 (*)    MCH 25.8 (*)    RDW 18.4 (*)    All other components within normal limits  SARS  CORONAVIRUS 2 (HOSPITAL ORDER, [facility])  TROPONIN I  BRAIN NATRIURETIC PEPTIDE    EKG EKG Interpretation  Date/Time:  Friday January 16 2019 11:58:03 EDT Ventricular Rate:  77 PR Interval:    QRS Duration: 92 QT Interval:  393 QTC Calculation: 445 R Axis:   -72 Text Interpretation:  Sinus rhythm Probable left atrial enlargement Inferior infarct, age indeterminate Reconfirmed by Davonna Belling [phone]) on 01/16/2019 12:17:34 PM   Radiology Dg Chest 2 View  Result Date: 01/16/2019 CLINICAL DATA:  Chest pain. EXAM: CHEST - 2 VIEW COMPARISON:  10/09/2018. FINDINGS: Poor inspiration. Normal sized heart. Increased diffuse prominence of the interstitial markings with mild patchy density in both lung bases, accentuated by the poor inspiration. No visible pleural fluid. Mild thoracic spine degenerative changes. IMPRESSION: 1. Poor inspiration with bibasilar airspace opacity suspicious for pneumonia or alveolar edema. 2. Mildly progressive interstitial lung disease, possibly representing interstitial pulmonary edema or pneumonitis superimposed on chronic interstitial lung disease. Electronically Signed   By: Claudie Revering M.D.   On: 01/16/2019 12:50    Procedures Procedures (including critical care time)  Medications Ordered in ED Medications - No data to display   Initial Impression / Assessment and Plan / ED Course  I have reviewed the triage vital signs and the nursing notes.  Pertinent labs & imaging results that were available during my care of the patient were reviewed by me and considered in my medical decision making (see chart for details).        Patient presented with chest pain.  Left breast to left arm.  States feels like previous heart issues.  EKG has some nonspecific changes.  Troponin negative and has had pain since yesterday.  No cough.  No fevers.  Does not clearly have pneumonia.  BNP is normal however in terms of effusions.  Does have come  chronic changes.  With the chest pain however will admit to hospitalist for further chest pain work-up.  Final Clinical Impressions(s) / ED Diagnoses   Final diagnoses:  Nonspecific chest pain    ED Discharge Orders    None       Davonna Belling, MD 01/16/19 1444

## 2019-01-16 NOTE — ED Notes (Signed)
ED TO INPATIENT HANDOFF REPORT  Name/Age/Gender Brenda Sims 64 y.o. female  Code Status Code Status History    Date Active Date Inactive Code Status Order ID Comments User Context   10/09/2018 2121 10/10/2018 1549 Full Code [id number]  Rise Patience, MD ED   08/13/2018 1905 08/15/2018 0604 Full Code [id number]  Merton Border, MD Inpatient   08/13/2018 0154 08/13/2018 0528 Full Code [id number]  Norval Morton, MD ED   06/27/2018 1807 06/28/2018 1802 Full Code [id number]  Guilford Shi, MD ED   03/06/2018 0028 03/07/2018 1808 Full Code [id number]  Ivor Costa, MD ED   10/19/2017 2342 10/23/2017 1709 Full Code [id number]  Ivor Costa, MD ED   10/07/2017 2234 10/08/2017 2139 Full Code [id number]  Rise Patience, MD ED   09/03/2017 0526 09/12/2017 1524 Full Code [id number]  Arnell Asal, NP Inpatient   08/29/2017 1752 09/03/2017 0526 Full Code [id number]  Rosita Fire, MD ED      Home/SNF/Other Home  Chief Complaint chest pain   Level of Care/Admitting Diagnosis ED Disposition    ED Disposition Condition [facility]: [facility] [[id number]]  Level of Care: Telemetry [5]  Admit to tele based on following criteria: Complex arrhythmia (Bradycardia/Tachycardia)  Covid Evaluation: Confirmed COVID Negative  Diagnosis: Chest pain [277824]  Admitting Physician: Shelly Coss [[id number]]  Attending Physician: Shelly Coss [[id number]]  PT Class (Do Not Modify): Observation [104]  PT Acc Code (Do Not Modify): Observation [10022]       Medical History Past Medical History:  Diagnosis Date  . Anemia   . Anxiety   . CAD (coronary artery disease)   . CHF (congestive heart failure) ([place])   . CKD (chronic kidney disease)   . COPD (chronic obstructive pulmonary disease) ([place])   . Depression   . Diabetes mellitus without complication ([place])   . Hypertension   . Hypothyroid   . MI (myocardial infarction) ([place])   . Sleep apnea    no cpap , "never  gave me one"    Allergies Allergies  Allergen Reactions  . Eggs Or Egg-Derived Products Shortness Of Breath and Rash  . Azithromycin Swelling    Facial/throat swelling  . Cherry Swelling    Facial swelling  . Latex Rash    IV Location/Drains/Wounds Patient Lines/Drains/Airways Status   Active Line/Drains/Airways    Name:   Placement date:   Placement time:   Site:   Days:   Peripheral IV 01/16/19 Left Antecubital   01/16/19    1324    Antecubital   less than 1          Labs/Imaging Results for orders placed or performed during the hospital encounter of 01/16/19 (from the past 46 hour(s))  SARS Coronavirus 2 (CEPHEID - Performed in [facility] hospital lab), Hosp Order     Status: None   Collection Time: 01/16/19 12:27 PM  Result Value Ref Range   SARS Coronavirus 2 NEGATIVE NEGATIVE    Comment: (NOTE) If result is NEGATIVE SARS-CoV-2 target nucleic acids are NOT DETECTED. The SARS-CoV-2 RNA is generally detectable in upper and lower  respiratory specimens during the acute phase of infection. The lowest  concentration of SARS-CoV-2 viral copies this assay can detect is 250  copies / mL. A negative result does not preclude SARS-CoV-2 infection  and should not be used as the sole basis for treatment or other  patient management decisions.  A negative result may occur with  improper specimen collection / handling, submission of specimen other  than nasopharyngeal swab, presence of viral mutation(s) within the  areas targeted by this assay, and inadequate number of viral copies  (<250 copies / mL). A negative result must be combined with clinical  observations, patient history, and epidemiological information. If result is POSITIVE SARS-CoV-2 target nucleic acids are DETECTED. The SARS-CoV-2 RNA is generally detectable in upper and lower  respiratory specimens dur ing the acute phase of infection.  Positive  results are indicative of active infection with SARS-CoV-2.   Clinical  correlation with patient history and other diagnostic information is  necessary to determine patient infection status.  Positive results do  not rule out bacterial infection or co-infection with other viruses. If result is PRESUMPTIVE POSTIVE SARS-CoV-2 nucleic acids MAY BE PRESENT.   A presumptive positive result was obtained on the submitted specimen  and confirmed on repeat testing.  While 2019 novel coronavirus  (SARS-CoV-2) nucleic acids may be present in the submitted sample  additional confirmatory testing may be necessary for epidemiological  and / or clinical management purposes  to differentiate between  SARS-CoV-2 and other Sarbecovirus currently known to infect humans.  If clinically indicated additional testing with an alternate test  methodology [phone]) is advised. The SARS-CoV-2 RNA is generally  detectable in upper and lower respiratory sp ecimens during the acute  phase of infection. The expected result is Negative. Fact Sheet for Patients:  StrictlyIdeas.no Fact Sheet for Healthcare Providers: BankingDealers.co.za This test is not yet approved or cleared by the [place] FDA and has been authorized for detection and/or diagnosis of SARS-CoV-2 by FDA under an Emergency Use Authorization (EUA).  This EUA will remain in effect (meaning this test can be used) for the duration of the COVID-19 declaration under Section 564(b)(1) of the Act, 21 U.S.C. section 360bbb-3(b)(1), unless the authorization is terminated or revoked sooner. Performed at [facility], [place] [address], [place], [place] [address]   Basic metabolic panel     Status: Abnormal   Collection Time: 01/16/19 12:40 PM  Result Value Ref Range   Sodium 137 135 - 145 mmol/L   Potassium 4.5 3.5 - 5.1 mmol/L    Comment: SLIGHT HEMOLYSIS   Chloride 104 98 - 111 mmol/L   CO2 24 22 - 32 mmol/L   Glucose, Bld 287 (H) 70 - 99 mg/dL    BUN 57 (H) 8 - 23 mg/dL   Creatinine, Ser 2.85 (H) 0.44 - 1.00 mg/dL   Calcium 9.4 8.9 - 10.3 mg/dL   GFR calc non Af Amer 17 (L) >60 mL/min   GFR calc Af Amer 20 (L) >60 mL/min   Anion gap 9 5 - 15    Comment: Performed at [facility], [place] [address] [place], [place] [address]  CBC     Status: Abnormal   Collection Time: 01/16/19 12:40 PM  Result Value Ref Range   WBC 12.0 (H) 4.0 - 10.5 K/uL   RBC 4.19 3.87 - 5.11 MIL/uL   Hemoglobin 10.8 (L) 12.0 - 15.0 g/dL   HCT 35.1 (L) 36.0 - 46.0 %   MCV 83.8 80.0 - 100.0 fL   MCH 25.8 (L) 26.0 - 34.0 pg   MCHC 30.8 30.0 - 36.0 g/dL   RDW 18.4 (H) 11.5 - 15.5 %   Platelets 244 150 - 400 K/uL   nRBC 0.0 0.0 - 0.2 %    Comment: Performed at [facility], [place]  [address] [place], [place] [address]  Troponin I - ONCE - STAT     Status: None   Collection Time: 01/16/19 12:40 PM  Result Value Ref Range   Troponin I <0.03 <0.03 ng/mL    Comment: Performed at [facility], [place] [address] [place], [place] [address]  Brain natriuretic peptide     Status: None   Collection Time: 01/16/19 12:40 PM  Result Value Ref Range   B Natriuretic Peptide 45.5 0.0 - 100.0 pg/mL    Comment: Performed at [facility], [place] [address], [place], [place] [address]   Dg Chest 2 View  Result Date: 01/16/2019 CLINICAL DATA:  Chest pain. EXAM: CHEST - 2 VIEW COMPARISON:  10/09/2018. FINDINGS: Poor inspiration. Normal sized heart. Increased diffuse prominence of the interstitial markings with mild patchy density in both lung bases, accentuated by the poor inspiration. No visible pleural fluid. Mild thoracic spine degenerative changes. IMPRESSION: 1. Poor inspiration with bibasilar airspace opacity suspicious for pneumonia or alveolar edema. 2. Mildly progressive interstitial lung disease, possibly representing interstitial pulmonary edema or pneumonitis superimposed on chronic interstitial lung  disease. Electronically Signed   By: Claudie Revering M.D.   On: 01/16/2019 12:50    Pending Labs Unresulted Labs (From admission, onward)   None      Vitals/Pain Today's Vitals   01/16/19 1330 01/16/19 1400 01/16/19 1430 01/16/19 1500  BP: (!) 102/52 115/67 118/69 118/61  Pulse: 71 76 77 76  Resp: 18 19 19 20   Temp:      TempSrc:      SpO2: 93% 94% 91% 91%  Weight:      Height:      PainSc:        Isolation Precautions No active isolations  Medications Medications - No data to display  Mobility walks

## 2019-01-16 NOTE — ED Notes (Signed)
Hospitalist at bedside 

## 2019-01-16 NOTE — ED Triage Notes (Signed)
Pt states she was seen at PCP and had an EKG done there. She was then directed to come here for eval.  Pt states pain is under left breast and down her arm

## 2019-01-16 NOTE — Plan of Care (Signed)
  Problem: Education: Goal: Knowledge of General Education information will improve Description Including pain rating scale, medication(s)/side effects and non-pharmacologic comfort measures Outcome: Completed/Met

## 2019-01-16 NOTE — H&P (Addendum)
History and Physical    Brenda Sims NIO:[id number] DOB: 1956-02-14 DOA: 01/16/2019  PCP: Leonard Downing, MD   Patient coming from: Home    Chief Complaint: Chest pain  HPI: Brenda Sims is a 63 y.o. female with medical history significant of CKD stage IV, chronic diastolic CHF, diabetes mellitus, hypertension, coronary disease who presented to the emergency department today with complaints of chest pain.  Pain started yesterday when she was at home.  She is reports that pain was not associated with exertion.  Chest pain is mainly on her left breast and left arm.  She describes the chest pain is sharp.  Rates as 10/10.  She also reports shortness of breath on exertion.  She stated that her pain was similar to the pain that she had when she had MI.  Patient was seen by her PCP today and was told to come to the emergency department.  Patient denies any shortness of breath at rest, cough, fever, chills, palpitations, abdomen pain, dysuria, nausea, vomiting, diarrhea . Patient has diabetes and is on insulin at home.  She follows with [facility] for her CKD.  She takes Lasix at home.  ED Course: Initial troponin was negative.  EKG showed nonspecific changes.  Patient was mildly hypotensive during my evaluation in the emergency department.  BNP normal.  Admitted for chest pain rule out.  Review of Systems: As per HPI otherwise 10 point review of systems negative.    Past Medical History:  Diagnosis Date  . Anemia   . Anxiety   . CAD (coronary artery disease)   . CHF (congestive heart failure) ([place])   . CKD (chronic kidney disease)   . COPD (chronic obstructive pulmonary disease) ([place])   . Depression   . Diabetes mellitus without complication ([place])   . Hypertension   . Hypothyroid   . MI (myocardial infarction) ([place])   . Sleep apnea    no cpap , "never gave me one"    Past Surgical History:  Procedure Laterality Date  . APPENDECTOMY    . BACK SURGERY    . LEFT HEART CATH  AND CORONARY ANGIOGRAPHY N/A 09/11/2017   Procedure: LEFT HEART CATH AND CORONARY ANGIOGRAPHY;  Surgeon: Martinique, Peter M, MD;  Location: [facility];  Service: Cardiovascular;  Laterality: N/A;  . TOTAL KNEE ARTHROPLASTY Right 2007     reports that she has never smoked. She has never used smokeless tobacco. She reports that she does not drink alcohol or use drugs.  Allergies  Allergen Reactions  . Eggs Or Egg-Derived Products Shortness Of Breath and Rash  . Azithromycin Swelling    Facial/throat swelling  . Cherry Swelling    Facial swelling  . Latex Rash    Family History  Problem Relation Age of Onset  . Hypertension Mother   . Colon cancer Neg Hx   . Esophageal cancer Neg Hx      Prior to Admission medications   Medication Sig Start Date End Date Taking? Authorizing Provider  ACCU-CHEK SOFTCLIX LANCETS lancets Use as instructed 10/16/17   Brayton Caves, PA-C  acetaminophen-codeine (TYLENOL #3) 300-30 MG tablet Take 1 tablet by mouth every 8 (eight) hours as needed for pain. 01/09/19   [provider]  albuterol (PROVENTIL HFA;VENTOLIN HFA) 108 (90 Base) MCG/ACT inhaler Inhale 1-2 puffs into the lungs every 6 (six) hours as needed for shortness of breath.    [provider]  amLODipine (NORVASC) 10 MG tablet Take  1 tablet (10 mg total) by mouth daily. MUST MAKE APPT FOR FURTHER REFILLS Patient taking differently: Take 10 mg by mouth daily.  06/24/18   Charlott Rakes, MD  aspirin EC 81 MG tablet Take 1 tablet (81 mg total) by mouth daily. Patient taking differently: Take 81 mg by mouth 2 (two) times daily.  10/16/17   Brayton Caves, PA-C  Blood Glucose Monitoring Suppl (TRUE METRIX METER) DEVI 1 kit by Does not apply route 4 (four) times daily. 10/16/17   Brayton Caves, PA-C  busPIRone (BUSPAR) 5 MG tablet Take 1 tablet (5 mg total) by mouth 3 (three) times daily. 01/21/18   Charlott Rakes, MD  carvedilol (COREG) 25 MG tablet TAKE 1 TABLET BY MOUTH 2 (TWO)  TIMES DAILY WITH A MEAL. Patient taking differently: Take 25 mg by mouth 2 (two) times daily with a meal.  05/29/18   Charlott Rakes, MD  cetirizine (ZYRTEC) 10 MG tablet TAKE 1 TABLET (10 MG TOTAL) BY MOUTH DAILY. 03/11/18   Charlott Rakes, MD  [employer] 8HR MUSCLE ACHES & PAIN 650 MG CR tablet Take 1,300 mg by mouth every 12 (twelve) hours. 11/19/18   [provider]  cyclobenzaprine (FLEXERIL) 10 MG tablet Take 1 tablet (10 mg total) by mouth 3 (three) times daily as needed for muscle spasms. 06/28/18   Purohit, Konrad Dolores, MD  diphenhydrAMINE (BENADRYL) 25 MG tablet Take 25 mg by mouth every 6 (six) hours as needed for itching or allergies.    [provider]  ferrous sulfate 325 (65 FE) MG tablet Take 325 mg by mouth 3 (three) times daily. 01/02/19   [provider]  FLUoxetine (PROZAC) 20 MG capsule Take 20 mg by mouth daily. 05/13/18   [provider]  fluticasone (FLONASE) 50 MCG/ACT nasal spray Place 2 sprays into both nostrils daily as needed for allergies or rhinitis. 12/15/18   [provider]  furosemide (LASIX) 40 MG tablet Take 2 tablets (80 mg total) by mouth every morning AND 1 tablet (40 mg total) every evening. Patient taking differently: Take  (80 mg total) by mouth every morning AND  (40 mg total) at 14:00 02/24/18 02/19/19  Croitoru, Mihai, MD  gabapentin (NEURONTIN) 300 MG capsule Take 1 capsule (300 mg total) by mouth 3 (three) times daily. Patient not taking: Reported on 10/09/2018 01/21/18   Charlott Rakes, MD  gabapentin (NEURONTIN) 800 MG tablet Take 800 mg by mouth 3 (three) times daily as needed (pain).  08/30/18   [provider]  glucose blood (ACCU-CHEK AVIVA) test strip Use as instructed 10/16/17   Brayton Caves, PA-C  glucose blood (TRUE METRIX BLOOD GLUCOSE TEST) test strip Use as instructed 10/16/17   Brayton Caves, PA-C  hydrALAZINE (APRESOLINE) 100 MG tablet Take 1 tablet (100 mg total) by mouth 3 (three) times daily. 01/21/18    Charlott Rakes, MD  Hyoscyamine Sulfate SL (LEVSIN/SL) 0.125 MG SUBL Place 1 tablet under the tongue every 4 (four) hours as needed. Patient taking differently: Place 1 tablet under the tongue every 4 (four) hours as needed (for abdominal cramping).  05/07/18   Charlann Lange, PA-C  Insulin Glargine (LANTUS SOLOSTAR) 100 UNIT/ML Solostar Pen Inject 20 Units into the skin 2 (two) times daily. Patient taking differently: Inject 20-30 Units into the skin See admin instructions. Take 30 units in the morning and at noon. Take 20 units with supper 01/21/18   Charlott Rakes, MD  insulin lispro (HUMALOG KWIKPEN) 100 UNIT/ML KwikPen Inject 4  Units into the skin 3 (three) times daily.    [provider]  Insulin Pen Needle (TRUEPLUS PEN NEEDLES) 31G X 6 MM MISC Use as directed 11/08/17   Charlott Rakes, MD  loratadine (CLARITIN) 10 MG tablet Take 10 mg by mouth daily. Allergies and congestion. 12/24/18   [provider]  losartan (COZAAR) 100 MG tablet Take 100 mg by mouth daily. 05/13/18   [provider]  montelukast (SINGULAIR) 10 MG tablet Take 1 tablet (10 mg total) by mouth at bedtime. 01/21/18   Charlott Rakes, MD  Multiple Vitamin (MULTIVITAMIN) tablet Take 1 tablet by mouth daily.    [provider]  potassium chloride SA (K-DUR,KLOR-CON) 10 MEQ tablet Take 1 tablet (10 mEq total) by mouth daily. 01/21/18   Charlott Rakes, MD  rosuvastatin (CRESTOR) 40 MG tablet Take 1 tablet (40 mg total) by mouth daily. Patient taking differently: Take 40 mg by mouth at bedtime.  01/21/18 10/09/18  Charlott Rakes, MD  rosuvastatin (CRESTOR) 40 MG tablet Take 40 mg by mouth at bedtime. cholesterol 12/22/18   [provider]  spironolactone (ALDACTONE) 50 MG tablet Take 1 tablet (50 mg total) by mouth daily. 01/21/18   Charlott Rakes, MD  traMADol (ULTRAM) 50 MG tablet Take 50-100 mg by mouth every 4 (four) hours as needed for pain. Max: 8 tabs/day. 12/08/18   [provider]  TRUEPLUS LANCETS 28G MISC 28 g by Does not apply route 4 (four) times daily. 10/16/17   Brayton Caves, PA-C    Physical Exam: Vitals:   01/16/19 1400 01/16/19 1430 01/16/19 1500 01/16/19 1600  BP: 115/67 118/69 118/61 110/79  Pulse: 76 77 76 81  Resp: '19 19 20 ' (!) 21  Temp:      TempSrc:      SpO2: 94% 91% 91% 95%  Weight:      Height:        Constitutional: Obese, in mild to moderate distress due to chest pain. Vitals:   01/16/19 1400 01/16/19 1430 01/16/19 1500 01/16/19 1600  BP: 115/67 118/69 118/61 110/79  Pulse: 76 77 76 81  Resp: '19 19 20 ' (!) 21  Temp:      TempSrc:      SpO2: 94% 91% 91% 95%  Weight:      Height:       Eyes: PERRL, lids and conjunctivae normal ENMT: Mucous membranes are moist. Posterior pharynx clear of any exudate or lesions.Normal dentition.  Neck: normal, supple, no masses, no thyromegaly Respiratory: clear to auscultation bilaterally, no wheezing, no crackles. Normal respiratory effort. No accessory muscle use.  Cardiovascular: Regular rate and rhythm, no murmurs / rubs / gallops. No extremity edema. 2+ pedal pulses. No carotid bruits.  Pain reproducible on deep palpation of the left chest Abdomen:obese, no tenderness, no masses palpated. No hepatosplenomegaly. Bowel sounds positive.  Musculoskeletal: no clubbing / cyanosis. No joint deformity upper and lower extremities. Good ROM, no contractures. Normal muscle tone.  Skin: no rashes, lesions, ulcers. No induration Neurologic: CN 2-12 grossly intact. Sensation intact, DTR normal. Strength 5/5 in all 4.  Psychiatric: Normal judgment and insight. Alert and oriented x 3. Normal mood.   Foley Catheter:None  Labs on Admission: I have personally reviewed following labs and imaging studies  CBC: Recent Labs  Lab 01/16/19 1240  WBC 12.0*  HGB 10.8*  HCT 35.1*  MCV 83.8  PLT 627   Basic Metabolic Panel: Recent Labs  Lab 01/16/19 1240  NA 137  K 4.5  CL 104  CO2 24   GLUCOSE 287*  BUN 57*  CREATININE 2.85*  CALCIUM 9.4   GFR: Estimated Creatinine Clearance: 22.2 mL/min (A) (by C-G formula based on SCr of 2.85 mg/dL (H)). Liver Function Tests: No results for input(s): AST, ALT, ALKPHOS, BILITOT, PROT, ALBUMIN in the last 168 hours. No results for input(s): LIPASE, AMYLASE in the last 168 hours. No results for input(s): AMMONIA in the last 168 hours. Coagulation Profile: No results for input(s): INR, PROTIME in the last 168 hours. Cardiac Enzymes: Recent Labs  Lab 01/16/19 1240  TROPONINI <0.03   BNP (last 3 results) No results for input(s): PROBNP in the last 8760 hours. HbA1C: No results for input(s): HGBA1C in the last 72 hours. CBG: No results for input(s): GLUCAP in the last 168 hours. Lipid Profile: No results for input(s): CHOL, HDL, LDLCALC, TRIG, CHOLHDL, LDLDIRECT in the last 72 hours. Thyroid Function Tests: No results for input(s): TSH, T4TOTAL, FREET4, T3FREE, THYROIDAB in the last 72 hours. Anemia Panel: No results for input(s): VITAMINB12, FOLATE, FERRITIN, TIBC, IRON, RETICCTPCT in the last 72 hours. Urine analysis:    Component Value Date/Time   COLORURINE STRAW (A) 08/14/2018 [place] 08/14/2018 1037   LABSPEC 1.010 08/14/2018 1037   PHURINE 6.0 08/14/2018 1037   GLUCOSEU NEGATIVE 08/14/2018 1037   HGBUR NEGATIVE 08/14/2018 1037   BILIRUBINUR NEGATIVE 08/14/2018 1037   KETONESUR NEGATIVE 08/14/2018 1037   PROTEINUR NEGATIVE 08/14/2018 1037   NITRITE NEGATIVE 08/14/2018 1037   LEUKOCYTESUR NEGATIVE 08/14/2018 1037    Radiological Exams on Admission: Dg Chest 2 View  Result Date: 01/16/2019 CLINICAL DATA:  Chest pain. EXAM: CHEST - 2 VIEW COMPARISON:  10/09/2018. FINDINGS: Poor inspiration. Normal sized heart. Increased diffuse prominence of the interstitial markings with mild patchy density in both lung bases, accentuated by the poor inspiration. No visible pleural fluid. Mild thoracic spine  degenerative changes. IMPRESSION: 1. Poor inspiration with bibasilar airspace opacity suspicious for pneumonia or alveolar edema. 2. Mildly progressive interstitial lung disease, possibly representing interstitial pulmonary edema or pneumonitis superimposed on chronic interstitial lung disease. Electronically Signed   By: Claudie Revering M.D.   On: 01/16/2019 12:50     Assessment/Plan Principal Problem:   Chest pain Active Problems:   Acute on chronic combined systolic and diastolic CHF (congestive heart failure) ([facility])   Essential hypertension   CKD (chronic kidney disease) stage 4, GFR 15-29 ml/min ([facility])   Type II diabetes mellitus with renal manifestations ([facility])   Bronchitis   HLD (hyperlipidemia)  Chest pain: Sounded atypical.  No associated with exertion.  Complains of pain below her left breast and left arm.  Exacerbated with movement and deep palpation of the chest.  Initial troponin negative.  We will continue to cycle troponin.  Will check echocardiogram.  Continue Nitrostat as needed for chest pain along with pain meds. EKG on presentation this showed nonspecific changes.  Chronic combined systolic/diastolic CHF: Currently euvolemic.  Her last echocardiogram on 02/2018 showed ejection fraction of 50%, hypokinesis of the basal inferior septal myocardium, grade 2 diastolic dysfunction. She is on Lasix 40 mg twice daily at home.  We will continue that.  BNP normal on presentation. Chest x-ray showed interstitial pulmonary edema.  Lungs were clear on auscultation.  Continue Lasix.  CKD stage IV: Her baseline creatinine ranged from 2-3.  Currently kidney function at baseline.  Follows with [facility].  History of coronary disease: On aspirin and beta-blockers at home.  Continue those.  Hypertension: On antihypertensive at home.  Currently blood pressure soft to low.  Will hold antihypertensives.  History of anxiety/depression: On BuSpar and Prozac.  Diabetes mellitus type 2: On  Lantus and short-acting insulin at home.  We will continue sliding scale insulin and Lantus here.  Will check hemoglobin A1c level.  Hyperlipidemia: Continue Crestor.  History of chronic pain syndrome: On Neurontin and Flexeril.  Sleep apnea: On CPAP.    Severity of Illness: The appropriate patient status for this patient is OBSERVATION. Observation status is judged to be reasonable and necessary in o    DVT prophylaxis: Heparin [place] Code Status: Full Family Communication: None present at the bedside Consults called: Cardiology     Shelly Coss MD [facility] Pager [phone]  If 7PM-7AM, please contact night-coverage [email or web address] Password [email or web address]  01/16/2019, 4:14 PM

## 2019-01-17 ENCOUNTER — Observation Stay (HOSPITAL_BASED_OUTPATIENT_CLINIC_OR_DEPARTMENT_OTHER): Payer: Medicaid Other

## 2019-01-17 DIAGNOSIS — R079 Chest pain, unspecified: Secondary | ICD-10-CM | POA: Diagnosis not present

## 2019-01-17 DIAGNOSIS — R0789 Other chest pain: Secondary | ICD-10-CM | POA: Diagnosis not present

## 2019-01-17 LAB — HIV ANTIBODY (ROUTINE TESTING W REFLEX): HIV Screen 4th Generation wRfx: NONREACTIVE

## 2019-01-17 LAB — BASIC METABOLIC PANEL
Anion gap: 9 (ref 5–15)
BUN: 60 mg/dL — ABNORMAL HIGH (ref 8–23)
CO2: 24 mmol/L (ref 22–32)
Calcium: 9.3 mg/dL (ref 8.9–10.3)
Chloride: 107 mmol/L (ref 98–111)
Creatinine, Ser: 2.81 mg/dL — ABNORMAL HIGH (ref 0.44–1.00)
GFR calc Af Amer: 20 mL/min — ABNORMAL LOW (ref >60)
GFR calc non Af Amer: 17 mL/min — ABNORMAL LOW (ref >60)
Glucose, Bld: 208 mg/dL — ABNORMAL HIGH (ref 70–99)
Potassium: 3.9 mmol/L (ref 3.5–5.1)
Sodium: 140 mmol/L (ref 135–145)

## 2019-01-17 LAB — ECHOCARDIOGRAM COMPLETE
Height: 64 in
Weight: 3232 oz

## 2019-01-17 LAB — CBC
HCT: 35.3 % — ABNORMAL LOW (ref 36.0–46.0)
Hemoglobin: 10.7 g/dL — ABNORMAL LOW (ref 12.0–15.0)
MCH: 25.6 pg — ABNORMAL LOW (ref 26.0–34.0)
MCHC: 30.3 g/dL (ref 30.0–36.0)
MCV: 84.4 fL (ref 80.0–100.0)
Platelets: 272 10*3/uL (ref 150–400)
RBC: 4.18 MIL/uL (ref 3.87–5.11)
RDW: 18.7 % — ABNORMAL HIGH (ref 11.5–15.5)
WBC: 10.7 10*3/uL — ABNORMAL HIGH (ref 4.0–10.5)
nRBC: 0 % (ref 0.0–0.2)

## 2019-01-17 LAB — GLUCOSE, CAPILLARY
Glucose-Capillary: 275 mg/dL — ABNORMAL HIGH (ref 70–99)
Glucose-Capillary: 356 mg/dL — ABNORMAL HIGH (ref 70–99)
Glucose-Capillary: 199 mg/dL — ABNORMAL HIGH (ref 70–99)

## 2019-01-17 LAB — TROPONIN I
Troponin I: <0.03 ng/mL (ref <0.03)
Troponin I: <0.03 ng/mL (ref <0.03)

## 2019-01-17 MED ORDER — INSULIN GLARGINE 100 UNIT/ML ~~LOC~~ SOLN
30.0000 [IU] | Freq: Every day | SUBCUTANEOUS | Status: DC
  Administered 2019-01-17: 10:00:00 30 [IU] via SUBCUTANEOUS
  Filled 2019-01-17: qty 0.3

## 2019-01-17 MED ORDER — INSULIN ASPART 100 UNIT/ML ~~LOC~~ SOLN
6.0000 [IU] | Freq: Three times a day (TID) | SUBCUTANEOUS | Status: DC
  Administered 2019-01-17: 6 [IU] via SUBCUTANEOUS

## 2019-01-17 MED ORDER — POLYETHYLENE GLYCOL 3350 17 G PO PACK
17.0000 g | PACK | Freq: Every day | ORAL | Status: DC
  Administered 2019-01-17: 17 g via ORAL
  Filled 2019-01-17: qty 1

## 2019-01-17 MED ORDER — BISACODYL 10 MG RE SUPP
10.0000 mg | Freq: Once | RECTAL | Status: DC
  Filled 2019-01-17: qty 1

## 2019-01-17 NOTE — Discharge Summary (Signed)
Physician Discharge Summary  Brenda Sims BZM:[id number] DOB: 01-02-1956 DOA: 01/16/2019  PCP: Leonard Downing, MD  Admit date: 01/16/2019 Discharge date: 01/17/2019  Admitted From: Home Disposition:  Home  Discharge Condition:Stable CODE STATUS:FULL Diet recommendation: Heart Healthy  Brief/Interim Summary: HPI: Brenda Sims is a 63 y.o. female with medical history significant of CKD stage IV, chronic diastolic CHF, diabetes mellitus, hypertension, coronary disease who presented to the emergency department today with complaints of chest pain.  Pain started yesterday when she was at home.  She is reports that pain was not associated with exertion.  Chest pain is mainly on her left breast and left arm.  She describes the chest pain is sharp.  Rates as 10/10.  She also reports shortness of breath on exertion.  She stated that her pain was similar to the pain that she had when she had MI.  Patient was seen by her PCP today and was told to come to the emergency department.  Patient denies any shortness of breath at rest, cough, fever, chills, palpitations, abdomen pain, dysuria, nausea, vomiting, diarrhea . Patient has diabetes and is on insulin at home.  She follows with [facility] for her CKD.  She takes Lasix at home.  ED Course: Initial troponin was negative.  EKG showed nonspecific changes.  Patient was mildly hypotensive during my evaluation in the emergency department.  BNP normal.  Admitted for chest pain rule out.  Hospital course:  Her hospital course remained stable.  Her chest pain resolved after admission.  EKG did not show any ischemic changes.  Her troponins were thoroughly cycled and they were negative.  Echocardiogram done here showed ejection fraction of 55 to 60%, no wall motion abnormality, impaired relaxation.  This morning she was chest pain-free.  She is currently hemodynamically stable.  She is stable for discharge to home today.  Following problems were addressed  during her hospitalization:  Chest pain: Sounded atypical.  No associated with exertion.  Complained of pain below her left breast and left arm.  Exacerbated with movement and deep palpation of the chest.   EKG on presentation this showed nonspecific changes. Troponins were negative.  Chest pain has resolved this morning.  Chest pain was most likely acid with musculoskeletal etiology.  No further intervention needed.  Chronic combined systolic/diastolic CHF: Currently euvolemic.  Her last echocardiogram on 02/2018 showed ejection fraction of 50%, hypokinesis of the basal inferior septal myocardium, grade 2 diastolic dysfunction. She is on Lasix 40 mg twice daily at home.  We will continue that.  BNP normal on presentation. Chest x-ray showed interstitial pulmonary edema.  Lungs were clear on auscultation.  Continue Lasix.  CKD stage IV: Her baseline creatinine ranged from 2-3.  Currently kidney function at baseline.  Follows with [facility].  History of coronary disease: On aspirin and beta-blockers at home.  Continue those.  Hypertension: On antihypertensive at home.  BP stable  History of anxiety/depression: On BuSpar and Prozac.  Diabetes mellitus type 2: On Lantus and short-acting insulin at home. HbA1C of 9.3  Hyperlipidemia: Continue Crestor.  History of chronic pain syndrome: On Neurontin and Flexeril.  Sleep apnea: On CPAP.    Discharge Diagnoses:  Principal Problem:   Chest pain Active Problems:   Acute on chronic combined systolic and diastolic CHF (congestive heart failure) (HCC)   Essential hypertension   CKD (chronic kidney disease) stage 4, GFR 15-29 ml/min (HCC)   Type II diabetes mellitus with renal manifestations ([place])  Bronchitis   HLD (hyperlipidemia)    Discharge Instructions  Discharge Instructions    Diet - low sodium heart healthy   Complete by:  As directed    Discharge instructions   Complete by:  As directed    1) Please follow  up with your PCP and nephrologist. 2)Continue taking your home medicines.   Increase activity slowly   Complete by:  As directed      Allergies as of 01/17/2019      Reactions   Eggs Or Egg-derived Products Shortness Of Breath, Rash   Azithromycin Swelling   Facial/throat swelling   Cherry Swelling   Facial swelling   Latex Rash      Medication List    TAKE these medications   acetaminophen-codeine 300-30 MG tablet Commonly known as:  TYLENOL #3 Take 1 tablet by mouth every 8 (eight) hours as needed for pain.   albuterol 108 (90 Base) MCG/ACT inhaler Commonly known as:  VENTOLIN HFA Inhale 1-2 puffs into the lungs every 6 (six) hours as needed for shortness of breath.   amLODipine 10 MG tablet Commonly known as:  NORVASC Take 1 tablet (10 mg total) by mouth daily. MUST MAKE APPT FOR FURTHER REFILLS What changed:  additional instructions   aspirin EC 81 MG tablet Take 1 tablet (81 mg total) by mouth daily. What changed:  when to take this   busPIRone 5 MG tablet Commonly known as:  BUSPAR Take 1 tablet (5 mg total) by mouth 3 (three) times daily.   carvedilol 25 MG tablet Commonly known as:  COREG TAKE 1 TABLET BY MOUTH 2 (TWO) TIMES DAILY WITH A MEAL. What changed:  See the new instructions.   cetirizine 10 MG tablet Commonly known as:  ZYRTEC TAKE 1 TABLET (10 MG TOTAL) BY MOUTH DAILY.   [employer] 8HR Muscle Aches & Pain 650 MG CR tablet Generic drug:  acetaminophen Take 1,300 mg by mouth every 12 (twelve) hours.   cyclobenzaprine 10 MG tablet Commonly known as:  FLEXERIL Take 1 tablet (10 mg total) by mouth 3 (three) times daily as needed for muscle spasms.   diphenhydrAMINE 25 MG tablet Commonly known as:  BENADRYL Take 25 mg by mouth every 6 (six) hours as needed for itching or allergies.   ferrous sulfate 325 (65 FE) MG tablet Take 325 mg by mouth 3 (three) times daily.   FLUoxetine 20 MG capsule Commonly known as:  PROZAC Take 20 mg by mouth daily.    fluticasone 50 MCG/ACT nasal spray Commonly known as:  FLONASE Place 2 sprays into both nostrils daily as needed for allergies or rhinitis.   furosemide 40 MG tablet Commonly known as:  LASIX Take 2 tablets (80 mg total) by mouth every morning AND 1 tablet (40 mg total) every evening.   gabapentin 800 MG tablet Commonly known as:  NEURONTIN Take 800 mg by mouth 3 (three) times daily as needed (pain).   glucose blood test strip Commonly known as:  True Metrix Blood Glucose Test Use as instructed   glucose blood test strip Commonly known as:  Accu-Chek Aviva Use as instructed   HumaLOG KwikPen 100 UNIT/ML KwikPen Generic drug:  insulin lispro Inject 4 Units into the skin 3 (three) times daily.   hydrALAZINE 100 MG tablet Commonly known as:  APRESOLINE Take 1 tablet (100 mg total) by mouth 3 (three) times daily.   hydrOXYzine 25 MG capsule Commonly known as:  VISTARIL Take 25-50 mg by mouth every 6 (six)  hours as needed for anxiety.   Hyoscyamine Sulfate SL 0.125 MG Subl Commonly known as:  Levsin/SL Place 1 tablet under the tongue every 4 (four) hours as needed. What changed:  reasons to take this   Insulin Glargine 100 UNIT/ML Solostar Pen Commonly known as:  Lantus SoloStar Inject 20 Units into the skin 2 (two) times daily. What changed:    how much to take  when to take this  additional instructions   Insulin Pen Needle 31G X 6 MM Misc Commonly known as:  TRUEplus Pen Needles Use as directed   loratadine 10 MG tablet Commonly known as:  CLARITIN Take 10 mg by mouth daily. Allergies and congestion.   losartan 100 MG tablet Commonly known as:  COZAAR Take 100 mg by mouth daily.   montelukast 10 MG tablet Commonly known as:  SINGULAIR Take 1 tablet (10 mg total) by mouth at bedtime.   multivitamin tablet Take 1 tablet by mouth daily.   potassium chloride 10 MEQ tablet Commonly known as:  K-DUR Take 1 tablet (10 mEq total) by mouth daily.    rosuvastatin 40 MG tablet Commonly known as:  CRESTOR Take 1 tablet (40 mg total) by mouth daily. What changed:  when to take this   senna 8.6 MG Tabs tablet Commonly known as:  SENOKOT Take 2 tablets by mouth 2 (two) times a day.   spironolactone 50 MG tablet Commonly known as:  ALDACTONE Take 1 tablet (50 mg total) by mouth daily.   traMADol 50 MG tablet Commonly known as:  ULTRAM Take 50-100 mg by mouth every 4 (four) hours as needed for pain. Max: 8 tabs/day.   True Metrix Meter Devi 1 kit by Does not apply route 4 (four) times daily.   TRUEplus Lancets 28G Misc 28 g by Does not apply route 4 (four) times daily.   Accu-Chek Softclix Lancets lancets Use as instructed      Follow-up Information    Leonard Downing, MD. Schedule an appointment as soon as possible for a visit in 1 week(s).   Specialty:  Family Medicine Contact information: [place] [address] [phone]          Allergies  Allergen Reactions  . Eggs Or Egg-Derived Products Shortness Of Breath and Rash  . Azithromycin Swelling    Facial/throat swelling  . Cherry Swelling    Facial swelling  . Latex Rash    Consultations:  None   Procedures/Studies: Dg Chest 2 View  Result Date: 01/16/2019 CLINICAL DATA:  Chest pain. EXAM: CHEST - 2 VIEW COMPARISON:  10/09/2018. FINDINGS: Poor inspiration. Normal sized heart. Increased diffuse prominence of the interstitial markings with mild patchy density in both lung bases, accentuated by the poor inspiration. No visible pleural fluid. Mild thoracic spine degenerative changes. IMPRESSION: 1. Poor inspiration with bibasilar airspace opacity suspicious for pneumonia or alveolar edema. 2. Mildly progressive interstitial lung disease, possibly representing interstitial pulmonary edema or pneumonitis superimposed on chronic interstitial lung disease. Electronically Signed   By: Claudie Revering M.D.   On: 01/16/2019 12:50        Subjective: Patient seen and examined the bedside this morning.  Was undergoing echocardiogram.  Looks comfortable.  Hemodynamically stable.  Did not complain of any chest pain.  Discharge Exam: Vitals:   01/16/19 2233 01/17/19 0507  BP: (!) 111/49 105/64  Pulse: 85 74  Resp: 18 14  Temp: 99 F (37.2 C) 98.2 F (36.8 C)  SpO2: (!) 89% 94%  Vitals:   01/16/19 1616 01/16/19 1618 01/16/19 2233 01/17/19 0507  BP: 119/66 119/66 (!) 111/49 105/64  Pulse: 79 79 85 74  Resp:   18 14  Temp: 97.8 F (36.6 C) 97.8 F (36.6 C) 99 F (37.2 C) 98.2 F (36.8 C)  TempSrc: Oral Oral Oral Oral  SpO2: 93% 95% (!) 89% 94%  Weight:      Height: '5\' 4"'  (1.626 m)       General: Pt is alert, awake, not in acute distress,obese Cardiovascular: RRR, S1/S2 +, no rubs, no gallops Respiratory: CTA bilaterally, no wheezing, no rhonchi Abdominal: Soft, NT, ND, bowel sounds + Extremities: no edema, no cyanosis    The results of significant diagnostics from this hospitalization (including imaging, microbiology, ancillary and laboratory) are listed below for reference.     Microbiology: Recent Results (from the past 240 hour(s))  SARS Coronavirus 2 (CEPHEID - Performed in [place] hospital lab), Hosp Order     Status: None   Collection Time: 01/16/19 12:27 PM  Result Value Ref Range Status   SARS Coronavirus 2 NEGATIVE NEGATIVE Final    Comment: (NOTE) If result is NEGATIVE SARS-CoV-2 target nucleic acids are NOT DETECTED. The SARS-CoV-2 RNA is generally detectable in upper and lower  respiratory specimens during the acute phase of infection. The lowest  concentration of SARS-CoV-2 viral copies this assay can detect is 250  copies / mL. A negative result does not preclude SARS-CoV-2 infection  and should not be used as the sole basis for treatment or other  patient management decisions.  A negative result may occur with  improper specimen collection / handling, submission of  specimen other  than nasopharyngeal swab, presence of viral mutation(s) within the  areas targeted by this assay, and inadequate number of viral copies  (<250 copies / mL). A negative result must be combined with clinical  observations, patient history, and epidemiological information. If result is POSITIVE SARS-CoV-2 target nucleic acids are DETECTED. The SARS-CoV-2 RNA is generally detectable in upper and lower  respiratory specimens dur ing the acute phase of infection.  Positive  results are indicative of active infection with SARS-CoV-2.  Clinical  correlation with patient history and other diagnostic information is  necessary to determine patient infection status.  Positive results do  not rule out bacterial infection or co-infection with other viruses. If result is PRESUMPTIVE POSTIVE SARS-CoV-2 nucleic acids MAY BE PRESENT.   A presumptive positive result was obtained on the submitted specimen  and confirmed on repeat testing.  While 2019 novel coronavirus  (SARS-CoV-2) nucleic acids may be present in the submitted sample  additional confirmatory testing may be necessary for epidemiological  and / or clinical management purposes  to differentiate between  SARS-CoV-2 and other Sarbecovirus currently known to infect humans.  If clinically indicated additional testing with an alternate test  methodology [phone]) is advised. The SARS-CoV-2 RNA is generally  detectable in upper and lower respiratory sp ecimens during the acute  phase of infection. The expected result is Negative. Fact Sheet for Patients:  StrictlyIdeas.no Fact Sheet for Healthcare Providers: BankingDealers.co.za This test is not yet approved or cleared by the [place] FDA and has been authorized for detection and/or diagnosis of SARS-CoV-2 by FDA under an Emergency Use Authorization (EUA).  This EUA will remain in effect (meaning this test can be used) for the  duration of the COVID-19 declaration under Section 564(b)(1) of the Act, 21 U.S.C. section 360bbb-3(b)(1), unless the authorization is terminated or  revoked sooner. Performed at [facility], [place] [address] [place], [place] [address]      Labs: BNP (last 3 results) Recent Labs    08/12/18 2028 10/10/18 0908 01/16/19 1240  BNP 340.6* 50.8 56.3   Basic Metabolic Panel: Recent Labs  Lab 01/16/19 1240 01/17/19 0530  NA 137 140  K 4.5 3.9  CL 104 107  CO2 24 24  GLUCOSE 287* 208*  BUN 57* 60*  CREATININE 2.85* 2.81*  CALCIUM 9.4 9.3   Liver Function Tests: No results for input(s): AST, ALT, ALKPHOS, BILITOT, PROT, ALBUMIN in the last 168 hours. No results for input(s): LIPASE, AMYLASE in the last 168 hours. No results for input(s): AMMONIA in the last 168 hours. CBC: Recent Labs  Lab 01/16/19 1240 01/17/19 0530  WBC 12.0* 10.7*  HGB 10.8* 10.7*  HCT 35.1* 35.3*  MCV 83.8 84.4  PLT 244 272   Cardiac Enzymes: Recent Labs  Lab 01/16/19 1240 01/16/19 1640 01/16/19 2329 01/17/19 0530  TROPONINI <0.03 <0.03 <0.03 <0.03   BNP: Invalid input(s): POCBNP CBG: Recent Labs  Lab 01/16/19 1633 01/16/19 2122 01/17/19 0830 01/17/19 1127  GLUCAP 189* 307* 356* 199*   D-Dimer No results for input(s): DDIMER in the last 72 hours. Hgb A1c Recent Labs    01/16/19 1640  HGBA1C 9.3*   Lipid Profile No results for input(s): CHOL, HDL, LDLCALC, TRIG, CHOLHDL, LDLDIRECT in the last 72 hours. Thyroid function studies No results for input(s): TSH, T4TOTAL, T3FREE, THYROIDAB in the last 72 hours.  Invalid input(s): FREET3 Anemia work up No results for input(s): VITAMINB12, FOLATE, FERRITIN, TIBC, IRON, RETICCTPCT in the last 72 hours. Urinalysis    Component Value Date/Time   COLORURINE STRAW (A) 08/14/2018 1037   APPEARANCEUR CLEAR 08/14/2018 1037   LABSPEC 1.010 08/14/2018 1037   PHURINE 6.0 08/14/2018 1037   GLUCOSEU NEGATIVE 08/14/2018  1037   HGBUR NEGATIVE 08/14/2018 1037   BILIRUBINUR NEGATIVE 08/14/2018 1037   KETONESUR NEGATIVE 08/14/2018 1037   PROTEINUR NEGATIVE 08/14/2018 1037   NITRITE NEGATIVE 08/14/2018 1037   LEUKOCYTESUR NEGATIVE 08/14/2018 1037   Sepsis Labs Invalid input(s): PROCALCITONIN,  WBC,  LACTICIDVEN Microbiology Recent Results (from the past 240 hour(s))  SARS Coronavirus 2 (CEPHEID - Performed in [place] hospital lab), Hosp Order     Status: None   Collection Time: 01/16/19 12:27 PM  Result Value Ref Range Status   SARS Coronavirus 2 NEGATIVE NEGATIVE Final    Comment: (NOTE) If result is NEGATIVE SARS-CoV-2 target nucleic acids are NOT DETECTED. The SARS-CoV-2 RNA is generally detectable in upper and lower  respiratory specimens during the acute phase of infection. The lowest  concentration of SARS-CoV-2 viral copies this assay can detect is 250  copies / mL. A negative result does not preclude SARS-CoV-2 infection  and should not be used as the sole basis for treatment or other  patient management decisions.  A negative result may occur with  improper specimen collection / handling, submission of specimen other  than nasopharyngeal swab, presence of viral mutation(s) within the  areas targeted by this assay, and inadequate number of viral copies  (<250 copies / mL). A negative result must be combined with clinical  observations, patient history, and epidemiological information. If result is POSITIVE SARS-CoV-2 target nucleic acids are DETECTED. The SARS-CoV-2 RNA is generally detectable in upper and lower  respiratory specimens dur ing the acute phase of infection.  Positive  results are indicative of active infection with SARS-CoV-2.  Clinical  correlation with patient history and other diagnostic information is  necessary to determine patient infection status.  Positive results do  not rule out bacterial infection or co-infection with other viruses. If result is PRESUMPTIVE  POSTIVE SARS-CoV-2 nucleic acids MAY BE PRESENT.   A presumptive positive result was obtained on the submitted specimen  and confirmed on repeat testing.  While 2019 novel coronavirus  (SARS-CoV-2) nucleic acids may be present in the submitted sample  additional confirmatory testing may be necessary for epidemiological  and / or clinical management purposes  to differentiate between  SARS-CoV-2 and other Sarbecovirus currently known to infect humans.  If clinically indicated additional testing with an alternate test  methodology [phone]) is advised. The SARS-CoV-2 RNA is generally  detectable in upper and lower respiratory sp ecimens during the acute  phase of infection. The expected result is Negative. Fact Sheet for Patients:  StrictlyIdeas.no Fact Sheet for Healthcare Providers: BankingDealers.co.za This test is not yet approved or cleared by the [place] FDA and has been authorized for detection and/or diagnosis of SARS-CoV-2 by FDA under an Emergency Use Authorization (EUA).  This EUA will remain in effect (meaning this test can be used) for the duration of the COVID-19 declaration under Section 564(b)(1) of the Act, 21 U.S.C. section 360bbb-3(b)(1), unless the authorization is terminated or revoked sooner. Performed at [facility], [place] [address] [place], [place] [address]     Please note: You were cared for by a hospitalist during your hospital stay. Once you are discharged, your primary care physician will handle any further medical issues. Please note that NO REFILLS for any discharge medications will be authorized once you are discharged, as it is imperative that you return to your primary care physician (or establish a relationship with a primary care physician if you do not have one) for your post hospital discharge needs so that they can reassess your need for medications and monitor your lab  values.    Time coordinating discharge: 40 minutes  SIGNED:   Shelly Coss, MD  [facility] 01/17/2019, 1:30 PM Pager [phone]  If 7PM-7AM, please contact night-coverage [email or web address] Password [email or web address]

## 2019-01-17 NOTE — Plan of Care (Signed)
Discharge instructions reviewed with patient, questions answered, verbalized understanding.  Patient transported to main entrance of hospital via wheelchair to be taken home by friend.  Patient in possession of all belongings at discharge.

## 2019-01-17 NOTE — Progress Notes (Signed)
Case reviewed, patient discharged home prior to initial assessment completion. Patient has pcp, has insurance for medication, no home health needs.

## 2019-01-17 NOTE — Progress Notes (Signed)
  Echocardiogram 2D Echocardiogram has been performed.  Brenda Sims 01/17/2019, 8:58 AM

## 2019-02-02 NOTE — Telephone Encounter (Signed)
Patient has been scheduled for a phone visit with DR. Henrene Pastor. But would like to speak to someone before her visit.

## 2019-02-02 NOTE — Telephone Encounter (Signed)
Spoke with pt and explained to her that she needs to have the telehealth visit with Dr. Henrene Pastor first due to her recent hospitalization prior to rescheduling the procedure. Pt verbalized understanding.

## 2019-02-04 ENCOUNTER — Inpatient Hospital Stay (HOSPITAL_COMMUNITY)
Admission: EM | Admit: 2019-02-04 | Discharge: 2019-02-07 | DRG: 202 | Disposition: A | Discharge status: Home / Self Care | Payer: Medicaid Other | Attending: Internal Medicine | Admitting: Internal Medicine

## 2019-02-04 ENCOUNTER — Encounter (HOSPITAL_COMMUNITY): Payer: Self-pay | Admitting: Emergency Medicine

## 2019-02-04 DIAGNOSIS — D631 Anemia in chronic kidney disease: Secondary | ICD-10-CM | POA: Diagnosis present

## 2019-02-04 DIAGNOSIS — Z6835 Body mass index (BMI) 35.0-35.9, adult: Secondary | ICD-10-CM

## 2019-02-04 DIAGNOSIS — Z96651 Presence of right artificial knee joint: Secondary | ICD-10-CM | POA: Diagnosis present

## 2019-02-04 DIAGNOSIS — I10 Essential (primary) hypertension: Secondary | ICD-10-CM | POA: Diagnosis present

## 2019-02-04 DIAGNOSIS — I5022 Chronic systolic (congestive) heart failure: Secondary | ICD-10-CM | POA: Diagnosis present

## 2019-02-04 DIAGNOSIS — D509 Iron deficiency anemia, unspecified: Secondary | ICD-10-CM | POA: Diagnosis present

## 2019-02-04 DIAGNOSIS — J4531 Mild persistent asthma with (acute) exacerbation: Principal | ICD-10-CM | POA: Diagnosis present

## 2019-02-04 DIAGNOSIS — R079 Chest pain, unspecified: Secondary | ICD-10-CM | POA: Diagnosis present

## 2019-02-04 DIAGNOSIS — E109 Type 1 diabetes mellitus without complications: Secondary | ICD-10-CM | POA: Diagnosis present

## 2019-02-04 DIAGNOSIS — Z8249 Family history of ischemic heart disease and other diseases of the circulatory system: Secondary | ICD-10-CM

## 2019-02-04 DIAGNOSIS — E1065 Type 1 diabetes mellitus with hyperglycemia: Secondary | ICD-10-CM | POA: Diagnosis present

## 2019-02-04 DIAGNOSIS — J45901 Unspecified asthma with (acute) exacerbation: Secondary | ICD-10-CM | POA: Diagnosis present

## 2019-02-04 DIAGNOSIS — N184 Chronic kidney disease, stage 4 (severe): Secondary | ICD-10-CM | POA: Diagnosis present

## 2019-02-04 DIAGNOSIS — E669 Obesity, unspecified: Secondary | ICD-10-CM | POA: Diagnosis present

## 2019-02-04 DIAGNOSIS — E1042 Type 1 diabetes mellitus with diabetic polyneuropathy: Secondary | ICD-10-CM | POA: Diagnosis present

## 2019-02-04 DIAGNOSIS — I252 Old myocardial infarction: Secondary | ICD-10-CM

## 2019-02-04 DIAGNOSIS — J449 Chronic obstructive pulmonary disease, unspecified: Secondary | ICD-10-CM | POA: Diagnosis present

## 2019-02-04 DIAGNOSIS — Z794 Long term (current) use of insulin: Secondary | ICD-10-CM

## 2019-02-04 DIAGNOSIS — I2510 Atherosclerotic heart disease of native coronary artery without angina pectoris: Secondary | ICD-10-CM | POA: Diagnosis present

## 2019-02-04 DIAGNOSIS — F329 Major depressive disorder, single episode, unspecified: Secondary | ICD-10-CM | POA: Diagnosis present

## 2019-02-04 DIAGNOSIS — IMO0001 Reserved for inherently not codable concepts without codable children: Secondary | ICD-10-CM | POA: Diagnosis present

## 2019-02-04 DIAGNOSIS — J441 Chronic obstructive pulmonary disease with (acute) exacerbation: Secondary | ICD-10-CM

## 2019-02-04 DIAGNOSIS — Z7982 Long term (current) use of aspirin: Secondary | ICD-10-CM

## 2019-02-04 DIAGNOSIS — G4730 Sleep apnea, unspecified: Secondary | ICD-10-CM | POA: Diagnosis present

## 2019-02-04 DIAGNOSIS — Z20828 Contact with and (suspected) exposure to other viral communicable diseases: Secondary | ICD-10-CM | POA: Diagnosis present

## 2019-02-04 DIAGNOSIS — F419 Anxiety disorder, unspecified: Secondary | ICD-10-CM | POA: Diagnosis present

## 2019-02-04 DIAGNOSIS — E039 Hypothyroidism, unspecified: Secondary | ICD-10-CM | POA: Diagnosis present

## 2019-02-04 DIAGNOSIS — I130 Hypertensive heart and chronic kidney disease with heart failure and stage 1 through stage 4 chronic kidney disease, or unspecified chronic kidney disease: Secondary | ICD-10-CM | POA: Diagnosis present

## 2019-02-04 DIAGNOSIS — E1022 Type 1 diabetes mellitus with diabetic chronic kidney disease: Secondary | ICD-10-CM | POA: Diagnosis present

## 2019-02-04 DIAGNOSIS — R0789 Other chest pain: Secondary | ICD-10-CM | POA: Diagnosis present

## 2019-02-04 MED ORDER — SODIUM CHLORIDE 0.9% FLUSH
3.0000 mL | Freq: Once | INTRAVENOUS | Status: AC
  Administered 2019-02-05: 3 mL via INTRAVENOUS

## 2019-02-04 NOTE — ED Triage Notes (Signed)
Patient here from home with complaints of chest pain and SOB with cough that started 1 week ago. Reports mid upper chest pain radiating to right and left side. Patient keeps yelling in triage "I've already died once".

## 2019-02-04 NOTE — ED Notes (Signed)
EKG monitor not working in triage, will do it in room 15.

## 2019-02-05 ENCOUNTER — Other Ambulatory Visit: Payer: Self-pay

## 2019-02-05 ENCOUNTER — Emergency Department (HOSPITAL_COMMUNITY): Payer: Medicaid Other

## 2019-02-05 ENCOUNTER — Encounter (HOSPITAL_COMMUNITY): Payer: Self-pay | Admitting: Internal Medicine

## 2019-02-05 DIAGNOSIS — G4730 Sleep apnea, unspecified: Secondary | ICD-10-CM

## 2019-02-05 DIAGNOSIS — J45901 Unspecified asthma with (acute) exacerbation: Secondary | ICD-10-CM | POA: Diagnosis not present

## 2019-02-05 DIAGNOSIS — N184 Chronic kidney disease, stage 4 (severe): Secondary | ICD-10-CM

## 2019-02-05 DIAGNOSIS — I200 Unstable angina: Secondary | ICD-10-CM

## 2019-02-05 DIAGNOSIS — R0789 Other chest pain: Secondary | ICD-10-CM | POA: Diagnosis not present

## 2019-02-05 DIAGNOSIS — J4531 Mild persistent asthma with (acute) exacerbation: Principal | ICD-10-CM

## 2019-02-05 DIAGNOSIS — I10 Essential (primary) hypertension: Secondary | ICD-10-CM

## 2019-02-05 DIAGNOSIS — E109 Type 1 diabetes mellitus without complications: Secondary | ICD-10-CM | POA: Diagnosis not present

## 2019-02-05 LAB — BASIC METABOLIC PANEL
Anion gap: 11 (ref 5–15)
Anion gap: 14 (ref 5–15)
BUN: 73 mg/dL — ABNORMAL HIGH (ref 8–23)
BUN: 74 mg/dL — ABNORMAL HIGH (ref 8–23)
CO2: 17 mmol/L — ABNORMAL LOW (ref 22–32)
CO2: 23 mmol/L (ref 22–32)
Calcium: 9.2 mg/dL (ref 8.9–10.3)
Calcium: 9.6 mg/dL (ref 8.9–10.3)
Chloride: 104 mmol/L (ref 98–111)
Chloride: 104 mmol/L (ref 98–111)
Creatinine, Ser: 3.12 mg/dL — ABNORMAL HIGH (ref 0.44–1.00)
Creatinine, Ser: 3.22 mg/dL — ABNORMAL HIGH (ref 0.44–1.00)
GFR calc Af Amer: 17 mL/min — ABNORMAL LOW (ref >60)
GFR calc Af Amer: 18 mL/min — ABNORMAL LOW (ref >60)
GFR calc non Af Amer: 15 mL/min — ABNORMAL LOW (ref >60)
GFR calc non Af Amer: 15 mL/min — ABNORMAL LOW (ref >60)
Glucose, Bld: 203 mg/dL — ABNORMAL HIGH (ref 70–99)
Glucose, Bld: 428 mg/dL — ABNORMAL HIGH (ref 70–99)
Potassium: 4.7 mmol/L (ref 3.5–5.1)
Potassium: 5.3 mmol/L — ABNORMAL HIGH (ref 3.5–5.1)
Sodium: 135 mmol/L (ref 135–145)
Sodium: 138 mmol/L (ref 135–145)

## 2019-02-05 LAB — CBC
HCT: 35.3 % — ABNORMAL LOW (ref 36.0–46.0)
Hemoglobin: 10.8 g/dL — ABNORMAL LOW (ref 12.0–15.0)
MCH: 26.2 pg (ref 26.0–34.0)
MCHC: 30.6 g/dL (ref 30.0–36.0)
MCV: 85.7 fL (ref 80.0–100.0)
Platelets: 260 10*3/uL (ref 150–400)
RBC: 4.12 MIL/uL (ref 3.87–5.11)
RDW: 17.4 % — ABNORMAL HIGH (ref 11.5–15.5)
WBC: 15.9 10*3/uL — ABNORMAL HIGH (ref 4.0–10.5)
nRBC: 0 % (ref 0.0–0.2)

## 2019-02-05 LAB — TROPONIN I (HIGH SENSITIVITY)
Troponin I (High Sensitivity): 5 ng/L (ref <18)
Troponin I (High Sensitivity): 7 ng/L (ref <18)

## 2019-02-05 LAB — BRAIN NATRIURETIC PEPTIDE: B Natriuretic Peptide: 40.4 pg/mL (ref 0.0–100.0)

## 2019-02-05 LAB — GLUCOSE, CAPILLARY
Glucose-Capillary: 416 mg/dL — ABNORMAL HIGH (ref 70–99)
Glucose-Capillary: 425 mg/dL — ABNORMAL HIGH (ref 70–99)
Glucose-Capillary: 429 mg/dL — ABNORMAL HIGH (ref 70–99)
Glucose-Capillary: 541 mg/dL (ref 70–99)

## 2019-02-05 LAB — D-DIMER, QUANTITATIVE: D-Dimer, Quant: 0.52 ug/mL-FEU — ABNORMAL HIGH (ref 0.00–0.50)

## 2019-02-05 LAB — SARS CORONAVIRUS 2 BY RT PCR (HOSPITAL ORDER, PERFORMED IN ~~LOC~~ HOSPITAL LAB): SARS Coronavirus 2: NEGATIVE

## 2019-02-05 LAB — PROCALCITONIN: Procalcitonin: <0.1 ng/mL

## 2019-02-05 MED ORDER — ALBUTEROL SULFATE HFA 108 (90 BASE) MCG/ACT IN AERS
2.0000 | INHALATION_SPRAY | Freq: Once | RESPIRATORY_TRACT | Status: AC
  Administered 2019-02-05: 2 via RESPIRATORY_TRACT
  Filled 2019-02-05: qty 6.7

## 2019-02-05 MED ORDER — ROSUVASTATIN CALCIUM 10 MG PO TABS
10.0000 mg | ORAL_TABLET | Freq: Every day | ORAL | Status: DC
  Administered 2019-02-06: 22:00:00 10 mg via ORAL
  Filled 2019-02-05 (×2): qty 1

## 2019-02-05 MED ORDER — FERROUS SULFATE 325 (65 FE) MG PO TABS
325.0000 mg | ORAL_TABLET | Freq: Three times a day (TID) | ORAL | Status: DC
  Administered 2019-02-05 – 2019-02-07 (×5): 325 mg via ORAL
  Filled 2019-02-05 (×6): qty 1

## 2019-02-05 MED ORDER — INSULIN ASPART 100 UNIT/ML ~~LOC~~ SOLN
10.0000 [IU] | Freq: Once | SUBCUTANEOUS | Status: AC
  Administered 2019-02-05: 10 [IU] via SUBCUTANEOUS

## 2019-02-05 MED ORDER — SALINE SPRAY 0.65 % NA SOLN: 1.0000 | NASAL | Status: DC | PRN

## 2019-02-05 MED ORDER — INSULIN ASPART 100 UNIT/ML ~~LOC~~ SOLN: 0.0000 [IU] | Freq: Three times a day (TID) | SUBCUTANEOUS | Status: DC

## 2019-02-05 MED ORDER — IPRATROPIUM-ALBUTEROL 0.5-2.5 (3) MG/3ML IN SOLN
3.0000 mL | Freq: Three times a day (TID) | RESPIRATORY_TRACT | Status: DC
  Administered 2019-02-05 – 2019-02-06 (×3): 3 mL via RESPIRATORY_TRACT
  Filled 2019-02-05 (×3): qty 3

## 2019-02-05 MED ORDER — INSULIN REGULAR BOLUS VIA INFUSION: 0.0000 [IU] | Freq: Three times a day (TID) | INTRAVENOUS | Status: DC

## 2019-02-05 MED ORDER — IPRATROPIUM-ALBUTEROL 0.5-2.5 (3) MG/3ML IN SOLN
3.0000 mL | Freq: Four times a day (QID) | RESPIRATORY_TRACT | Status: DC
  Administered 2019-02-05: 10:00:00 3 mL via RESPIRATORY_TRACT
  Filled 2019-02-05: qty 3

## 2019-02-05 MED ORDER — SODIUM CHLORIDE 0.9 % IV SOLN
INTRAVENOUS | Status: DC
  Administered 2019-02-06: 02:00:00 via INTRAVENOUS

## 2019-02-05 MED ORDER — INSULIN GLARGINE 100 UNIT/ML ~~LOC~~ SOLN
20.0000 [IU] | Freq: Every day | SUBCUTANEOUS | Status: DC
  Administered 2019-02-05: 20 [IU] via SUBCUTANEOUS
  Filled 2019-02-05: qty 0.2

## 2019-02-05 MED ORDER — FLUOXETINE HCL 20 MG PO CAPS
20.0000 mg | ORAL_CAPSULE | Freq: Every day | ORAL | Status: DC
  Administered 2019-02-05 – 2019-02-07 (×2): 20 mg via ORAL
  Filled 2019-02-05 (×3): qty 1

## 2019-02-05 MED ORDER — ALBUTEROL SULFATE (2.5 MG/3ML) 0.083% IN NEBU: 2.5000 mg | INHALATION_SOLUTION | Freq: Four times a day (QID) | RESPIRATORY_TRACT | Status: DC | PRN

## 2019-02-05 MED ORDER — DEXTROSE-NACL 5-0.45 % IV SOLN: INTRAVENOUS | Status: DC

## 2019-02-05 MED ORDER — AMLODIPINE BESYLATE 10 MG PO TABS
10.0000 mg | ORAL_TABLET | Freq: Every day | ORAL | Status: DC
  Administered 2019-02-05 – 2019-02-07 (×3): 10 mg via ORAL
  Filled 2019-02-05 (×3): qty 1

## 2019-02-05 MED ORDER — FENTANYL CITRATE (PF) 100 MCG/2ML IJ SOLN
50.0000 ug | Freq: Once | INTRAMUSCULAR | Status: AC
  Administered 2019-02-05: 50 ug via INTRAVENOUS
  Filled 2019-02-05: qty 2

## 2019-02-05 MED ORDER — LORATADINE 10 MG PO TABS: 10.0000 mg | ORAL_TABLET | Freq: Every day | ORAL | Status: DC | PRN

## 2019-02-05 MED ORDER — INSULIN REGULAR(HUMAN) IN NACL 100-0.9 UT/100ML-% IV SOLN
INTRAVENOUS | Status: DC
  Filled 2019-02-05: qty 100

## 2019-02-05 MED ORDER — MAGNESIUM SULFATE 2 GM/50ML IV SOLN
2.0000 g | Freq: Once | INTRAVENOUS | Status: AC
  Administered 2019-02-05: 03:00:00 2 g via INTRAVENOUS
  Filled 2019-02-05: qty 50

## 2019-02-05 MED ORDER — LIP MEDEX EX OINT: 1.0000 "application " | TOPICAL_OINTMENT | CUTANEOUS | Status: DC | PRN

## 2019-02-05 MED ORDER — MUSCLE RUB 10-15 % EX CREA: 1.0000 "application " | TOPICAL_CREAM | CUTANEOUS | Status: DC | PRN

## 2019-02-05 MED ORDER — BUDESONIDE 0.5 MG/2ML IN SUSP
0.5000 mg | Freq: Two times a day (BID) | RESPIRATORY_TRACT | Status: DC
  Administered 2019-02-05 – 2019-02-07 (×5): 0.5 mg via RESPIRATORY_TRACT
  Filled 2019-02-05 (×5): qty 2

## 2019-02-05 MED ORDER — SENNA 8.6 MG PO TABS
2.0000 | ORAL_TABLET | Freq: Two times a day (BID) | ORAL | Status: DC
  Administered 2019-02-05 – 2019-02-07 (×4): 17.2 mg via ORAL
  Filled 2019-02-05 (×4): qty 2

## 2019-02-05 MED ORDER — MONTELUKAST SODIUM 10 MG PO TABS
10.0000 mg | ORAL_TABLET | Freq: Every day | ORAL | Status: DC
  Administered 2019-02-06: 10 mg via ORAL
  Filled 2019-02-05 (×2): qty 1

## 2019-02-05 MED ORDER — ALBUTEROL SULFATE HFA 108 (90 BASE) MCG/ACT IN AERS
4.0000 | INHALATION_SPRAY | Freq: Once | RESPIRATORY_TRACT | Status: AC
  Administered 2019-02-05: 4 via RESPIRATORY_TRACT

## 2019-02-05 MED ORDER — SODIUM CHLORIDE 0.9 % IV SOLN: INTRAVENOUS | Status: DC

## 2019-02-05 MED ORDER — INSULIN REGULAR BOLUS VIA INFUSION
0.0000 [IU] | Freq: Three times a day (TID) | INTRAVENOUS | Status: DC
  Filled 2019-02-05: qty 10

## 2019-02-05 MED ORDER — INSULIN ASPART 100 UNIT/ML ~~LOC~~ SOLN: 0.0000 [IU] | Freq: Every day | SUBCUTANEOUS | Status: DC

## 2019-02-05 MED ORDER — METHYLPREDNISOLONE SODIUM SUCC 125 MG IJ SOLR
60.0000 mg | Freq: Once | INTRAMUSCULAR | Status: AC
  Administered 2019-02-05: 02:00:00 60 mg via INTRAVENOUS
  Filled 2019-02-05: qty 2

## 2019-02-05 MED ORDER — DEXTROSE 50 % IV SOLN: 25.0000 mL | INTRAVENOUS | Status: DC | PRN

## 2019-02-05 MED ORDER — HYDROCORTISONE (PERIANAL) 2.5 % EX CREA: 1.0000 "application " | TOPICAL_CREAM | Freq: Four times a day (QID) | CUTANEOUS | Status: DC | PRN

## 2019-02-05 MED ORDER — ASPIRIN EC 81 MG PO TBEC
81.0000 mg | DELAYED_RELEASE_TABLET | Freq: Two times a day (BID) | ORAL | Status: DC
  Administered 2019-02-05 – 2019-02-07 (×4): 81 mg via ORAL
  Filled 2019-02-05 (×5): qty 1

## 2019-02-05 MED ORDER — INSULIN GLARGINE 100 UNIT/ML ~~LOC~~ SOLN
12.0000 [IU] | Freq: Once | SUBCUTANEOUS | Status: AC
  Administered 2019-02-05: 12 [IU] via SUBCUTANEOUS
  Filled 2019-02-05: qty 0.12

## 2019-02-05 MED ORDER — INSULIN GLARGINE 100 UNIT/ML ~~LOC~~ SOLN
30.0000 [IU] | Freq: Two times a day (BID) | SUBCUTANEOUS | Status: DC
  Administered 2019-02-05: 30 [IU] via SUBCUTANEOUS
  Filled 2019-02-05 (×3): qty 0.3

## 2019-02-05 MED ORDER — GABAPENTIN 400 MG PO CAPS
400.0000 mg | ORAL_CAPSULE | Freq: Three times a day (TID) | ORAL | Status: DC
  Administered 2019-02-05 (×2): 400 mg via ORAL
  Filled 2019-02-05 (×3): qty 1

## 2019-02-05 MED ORDER — PHENOL 1.4 % MT LIQD: 1.0000 | OROMUCOSAL | Status: DC | PRN

## 2019-02-05 MED ORDER — POTASSIUM CHLORIDE CRYS ER 10 MEQ PO TBCR
10.0000 meq | EXTENDED_RELEASE_TABLET | Freq: Every day | ORAL | Status: DC
  Administered 2019-02-05: 10 meq via ORAL
  Filled 2019-02-05: qty 1

## 2019-02-05 MED ORDER — IPRATROPIUM-ALBUTEROL 0.5-2.5 (3) MG/3ML IN SOLN: 3.0000 mL | RESPIRATORY_TRACT | Status: DC | PRN

## 2019-02-05 MED ORDER — METHYLPREDNISOLONE SODIUM SUCC 125 MG IJ SOLR
80.0000 mg | Freq: Three times a day (TID) | INTRAMUSCULAR | Status: DC
  Administered 2019-02-05: 80 mg via INTRAVENOUS
  Filled 2019-02-05: qty 2

## 2019-02-05 MED ORDER — ACETAMINOPHEN 325 MG PO TABS
650.0000 mg | ORAL_TABLET | Freq: Four times a day (QID) | ORAL | Status: DC | PRN
  Administered 2019-02-05: 650 mg via ORAL
  Filled 2019-02-05: qty 2

## 2019-02-05 MED ORDER — INSULIN GLARGINE 100 UNITS/ML SOLOSTAR PEN
30.0000 [IU] | PEN_INJECTOR | Freq: Two times a day (BID) | SUBCUTANEOUS | Status: DC
  Filled 2019-02-05: qty 3

## 2019-02-05 MED ORDER — FUROSEMIDE 40 MG PO TABS
80.0000 mg | ORAL_TABLET | Freq: Two times a day (BID) | ORAL | Status: DC
  Administered 2019-02-05 – 2019-02-07 (×5): 80 mg via ORAL
  Filled 2019-02-05 (×5): qty 2

## 2019-02-05 MED ORDER — SODIUM CHLORIDE 0.9% FLUSH
3.0000 mL | Freq: Two times a day (BID) | INTRAVENOUS | Status: DC
  Administered 2019-02-05 – 2019-02-06 (×4): 3 mL via INTRAVENOUS

## 2019-02-05 MED ORDER — INSULIN REGULAR(HUMAN) IN NACL 100-0.9 UT/100ML-% IV SOLN
INTRAVENOUS | Status: DC
  Administered 2019-02-06: 02:00:00 4.1 [IU]/h via INTRAVENOUS
  Filled 2019-02-05 (×2): qty 100

## 2019-02-05 MED ORDER — ALUM & MAG HYDROXIDE-SIMETH 200-200-20 MG/5ML PO SUSP: 30.0000 mL | ORAL | Status: DC | PRN

## 2019-02-05 MED ORDER — CARVEDILOL 12.5 MG PO TABS
25.0000 mg | ORAL_TABLET | Freq: Two times a day (BID) | ORAL | Status: DC
  Administered 2019-02-05 – 2019-02-07 (×5): 25 mg via ORAL
  Filled 2019-02-05 (×2): qty 2
  Filled 2019-02-05: qty 1
  Filled 2019-02-05: qty 2
  Filled 2019-02-05: qty 1

## 2019-02-05 MED ORDER — SODIUM CHLORIDE 0.9% FLUSH: 3.0000 mL | INTRAVENOUS | Status: DC | PRN

## 2019-02-05 MED ORDER — METHYLPREDNISOLONE SODIUM SUCC 40 MG IJ SOLR
40.0000 mg | Freq: Three times a day (TID) | INTRAMUSCULAR | Status: DC
  Administered 2019-02-05 – 2019-02-06 (×3): 40 mg via INTRAVENOUS
  Filled 2019-02-05 (×3): qty 1

## 2019-02-05 MED ORDER — SENNOSIDES-DOCUSATE SODIUM 8.6-50 MG PO TABS: 2.0000 | ORAL_TABLET | Freq: Every evening | ORAL | Status: DC | PRN

## 2019-02-05 MED ORDER — BUSPIRONE HCL 5 MG PO TABS
5.0000 mg | ORAL_TABLET | Freq: Three times a day (TID) | ORAL | Status: DC
  Administered 2019-02-05 – 2019-02-07 (×7): 5 mg via ORAL
  Filled 2019-02-05 (×7): qty 1

## 2019-02-05 MED ORDER — SPIRONOLACTONE 25 MG PO TABS
50.0000 mg | ORAL_TABLET | Freq: Every day | ORAL | Status: DC
  Administered 2019-02-05: 50 mg via ORAL
  Filled 2019-02-05: qty 2

## 2019-02-05 MED ORDER — ACETAMINOPHEN 650 MG RE SUPP: 650.0000 mg | Freq: Four times a day (QID) | RECTAL | Status: DC | PRN

## 2019-02-05 MED ORDER — ENOXAPARIN SODIUM 30 MG/0.3ML ~~LOC~~ SOLN
30.0000 mg | Freq: Every day | SUBCUTANEOUS | Status: DC
  Administered 2019-02-05 – 2019-02-07 (×3): 30 mg via SUBCUTANEOUS
  Filled 2019-02-05 (×3): qty 0.3

## 2019-02-05 MED ORDER — HYDROCORTISONE 1 % EX CREA: 1.0000 "application " | TOPICAL_CREAM | Freq: Three times a day (TID) | CUTANEOUS | Status: DC | PRN

## 2019-02-05 MED ORDER — LORATADINE 10 MG PO TABS
10.0000 mg | ORAL_TABLET | Freq: Every day | ORAL | Status: DC
  Administered 2019-02-05 – 2019-02-07 (×3): 10 mg via ORAL
  Filled 2019-02-05 (×3): qty 1

## 2019-02-05 MED ORDER — HYDRALAZINE HCL 20 MG/ML IJ SOLN: 10.0000 mg | INTRAMUSCULAR | Status: DC | PRN

## 2019-02-05 MED ORDER — INSULIN ASPART 100 UNIT/ML ~~LOC~~ SOLN
12.0000 [IU] | Freq: Once | SUBCUTANEOUS | Status: AC
  Administered 2019-02-05: 19:00:00 12 [IU] via SUBCUTANEOUS

## 2019-02-05 MED ORDER — FUROSEMIDE 10 MG/ML IJ SOLN
40.0000 mg | Freq: Once | INTRAMUSCULAR | Status: AC
  Administered 2019-02-05: 40 mg via INTRAVENOUS
  Filled 2019-02-05: qty 4

## 2019-02-05 MED ORDER — HYDRALAZINE HCL 50 MG PO TABS
100.0000 mg | ORAL_TABLET | Freq: Three times a day (TID) | ORAL | Status: DC
  Administered 2019-02-05 – 2019-02-07 (×5): 100 mg via ORAL
  Filled 2019-02-05 (×8): qty 2

## 2019-02-05 MED ORDER — POLYETHYLENE GLYCOL 3350 17 G PO PACK: 17.0000 g | PACK | Freq: Every day | ORAL | Status: DC | PRN

## 2019-02-05 MED ORDER — FLUTICASONE PROPIONATE 50 MCG/ACT NA SUSP: 2.0000 | Freq: Every day | NASAL | Status: DC | PRN

## 2019-02-05 MED ORDER — POLYVINYL ALCOHOL 1.4 % OP SOLN: 1.0000 [drp] | OPHTHALMIC | Status: DC | PRN

## 2019-02-05 MED ORDER — SODIUM CHLORIDE 0.9 % IV SOLN: 250.0000 mL | INTRAVENOUS | Status: DC | PRN

## 2019-02-05 MED ORDER — ALBUTEROL SULFATE (2.5 MG/3ML) 0.083% IN NEBU
2.5000 mg | INHALATION_SOLUTION | Freq: Four times a day (QID) | RESPIRATORY_TRACT | Status: DC
  Filled 2019-02-05: qty 3

## 2019-02-05 MED ORDER — SODIUM CHLORIDE 0.9 % IV SOLN
100.0000 mg | Freq: Two times a day (BID) | INTRAVENOUS | Status: DC
  Administered 2019-02-05 – 2019-02-06 (×3): 100 mg via INTRAVENOUS
  Filled 2019-02-05 (×4): qty 100

## 2019-02-05 NOTE — Plan of Care (Signed)

## 2019-02-05 NOTE — ED Notes (Signed)
ED TO INPATIENT HANDOFF REPORT  Name/Age/Gender Kess L Palazzo 63 y.o. female  Code Status Code Status History    Date Active Date Inactive Code Status Order ID Comments User Context   01/16/2019 1609 01/17/2019 1859 Full Code [id number]  Shelly Coss, MD ED   10/09/2018 2121 10/10/2018 1549 Full Code [id number]  Rise Patience, MD ED   08/13/2018 1905 08/15/2018 0604 Full Code [id number]  Merton Border, MD Inpatient   08/13/2018 0154 08/13/2018 0528 Full Code [id number]  Norval Morton, MD ED   06/27/2018 1807 06/28/2018 1802 Full Code [id number]  Guilford Shi, MD ED   03/06/2018 0028 03/07/2018 1808 Full Code [id number]  Ivor Costa, MD ED   10/19/2017 2342 10/23/2017 1709 Full Code [id number]  Ivor Costa, MD ED   10/07/2017 2234 10/08/2017 2139 Full Code [id number]  Rise Patience, MD ED   09/03/2017 0526 09/12/2017 1524 Full Code [id number]  Arnell Asal, NP Inpatient   08/29/2017 1752 09/03/2017 0526 Full Code [id number]  Rosita Fire, MD ED   Advance Care Planning Activity      Home/SNF/Other Home  Chief Complaint Chest Pain; Back Pain  Level of Care/Admitting Diagnosis ED Disposition    ED Disposition Condition [place]: [facility] [[id number]]  Level of Care: Telemetry [5]  Admit to tele based on following criteria: Monitor for Ischemic changes  Covid Evaluation: Screening Protocol (No Symptoms)  Diagnosis: Chest pain [226333]  Admitting Physician: Jani Gravel [[id number]]  Attending Physician: Jani Gravel [[id number]]  PT Class (Do Not Modify): Observation [104]  PT Acc Code (Do Not Modify): Observation [10022]       Medical History Past Medical History:  Diagnosis Date  . Anemia   . Anxiety   . CAD (coronary artery disease)   . CHF (congestive heart failure) ([place])   . CKD (chronic kidney disease)   . COPD (chronic obstructive pulmonary disease) ([place])   . Depression   . Diabetes mellitus without complication ([place])   . Hypertension    . Hypothyroid   . MI (myocardial infarction) ([place])   . Sleep apnea    no cpap , "never gave me one"    Allergies Allergies  Allergen Reactions  . Eggs Or Egg-Derived Products Shortness Of Breath and Rash  . Azithromycin Swelling    Facial/throat swelling  . Cherry Swelling    Facial swelling  . Latex Rash    IV Location/Drains/Wounds Patient Lines/Drains/Airways Status   Active Line/Drains/Airways    Name:   Placement date:   Placement time:   Site:   Days:   Peripheral IV 02/05/19 Left Antecubital   02/05/19    0009    Antecubital   less than 1          Labs/Imaging Results for orders placed or performed during the hospital encounter of 02/04/19 (from the past 48 hour(s))  Basic metabolic panel     Status: Abnormal   Collection Time: 02/04/19 11:59 PM  Result Value Ref Range   Sodium 138 135 - 145 mmol/L   Potassium 4.7 3.5 - 5.1 mmol/L   Chloride 104 98 - 111 mmol/L   CO2 23 22 - 32 mmol/L   Glucose, Bld 203 (H) 70 - 99 mg/dL   BUN 73 (H) 8 - 23 mg/dL   Creatinine, Ser 3.12 (H) 0.44 - 1.00 mg/dL   Calcium 9.6 8.9 - 10.3 mg/dL   GFR calc non Af Amer 15 (L) >60 mL/min  GFR calc Af Amer 18 (L) >60 mL/min   Anion gap 11 5 - 15    Comment: Performed at [facility], [place] [address] [place], [place] [address]  CBC     Status: Abnormal   Collection Time: 02/04/19 11:59 PM  Result Value Ref Range   WBC 15.9 (H) 4.0 - 10.5 K/uL   RBC 4.12 3.87 - 5.11 MIL/uL   Hemoglobin 10.8 (L) 12.0 - 15.0 g/dL   HCT 35.3 (L) 36.0 - 46.0 %   MCV 85.7 80.0 - 100.0 fL   MCH 26.2 26.0 - 34.0 pg   MCHC 30.6 30.0 - 36.0 g/dL   RDW 17.4 (H) 11.5 - 15.5 %   Platelets 260 150 - 400 K/uL   nRBC 0.0 0.0 - 0.2 %    Comment: Performed at [facility], [place] [address] [place], [place] [address]  Troponin I (High Sensitivity)     Status: None   Collection Time: 02/04/19 11:59 PM  Result Value Ref Range   Troponin I (High Sensitivity) 7 <18 ng/L     Comment: (NOTE) Elevated high sensitivity troponin I (hsTnI) values and significant  changes across serial measurements may suggest ACS but many other  chronic and acute conditions are known to elevate hsTnI results.  Refer to the "Links" section for chest pain algorithms and additional  guidance. Performed at [facility], [place] [address], [place], [place] [address]   Troponin I (High Sensitivity)     Status: None   Collection Time: 02/05/19 12:11 AM  Result Value Ref Range   Troponin I (High Sensitivity) 5.0 <18 ng/L    Comment: (NOTE) Elevated high sensitivity troponin I (hsTnI) values and significant  changes across serial measurements may suggest ACS but many other  chronic and acute conditions are known to elevate hsTnI results.  Refer to the "Links" section for chest pain algorithms and additional  guidance. Performed at [facility], [place] [address] [place], [place] [address]   D-dimer, quantitative (not at [facility])     Status: Abnormal   Collection Time: 02/05/19 12:11 AM  Result Value Ref Range   D-Dimer, Quant 0.52 (H) 0.00 - 0.50 ug/mL-FEU    Comment: (NOTE) At the manufacturer cut-off of 0.50 ug/mL FEU, this assay has been documented to exclude PE with a sensitivity and negative predictive value of 97 to 99%.  At this time, this assay has not been approved by the FDA to exclude DVT/VTE. Results should be correlated with clinical presentation. Performed at [facility], [place] [address] [place], [place] [address]   Brain natriuretic peptide     Status: None   Collection Time: 02/05/19 12:58 AM  Result Value Ref Range   B Natriuretic Peptide 40.4 0.0 - 100.0 pg/mL    Comment: Performed at [facility], [place] [address], [place], [place] [address]   Dg Chest 2 View  Result Date: 02/05/2019 CLINICAL DATA:  Chest and upper back pain EXAM: CHEST - 2 VIEW COMPARISON:  01/16/2019 FINDINGS: There is  mild cardiomegaly. There are bilateral basilar predominant interstitial opacities. No pneumothorax or sizable pleural effusion. IMPRESSION: Mild cardiomegaly with bibasilar opacities, likely atelectasis or mild interstitial pulmonary edema. Electronically Signed   By: Ulyses Jarred M.D.   On: 02/05/2019 01:06    Pending Labs Unresulted Labs (From admission, onward)    Start     Ordered   02/05/19 0115  SARS Coronavirus 2 (CEPHEID -  Performed in [place] hospital lab), [facility] Order  (Asymptomatic Patients Labs)  Once,   STAT    Question:  Rule Out  Answer:  Yes   02/05/19 0115          Vitals/Pain Today's Vitals   02/04/19 2343 02/04/19 2345 02/05/19 0130 02/05/19 0200  BP: (!) 146/118  129/69 (!) 107/53  Pulse: 99  89 86  Resp: 20  14 16   Temp: 98 F (36.7 C)     TempSrc: Oral     SpO2: 92%  94% 90%  PainSc:  10-Worst pain ever      Isolation Precautions No active isolations  Medications Medications  magnesium sulfate IVPB 2 g 50 mL (has no administration in time range)  sodium chloride flush (NS) 0.9 % injection 3 mL (3 mLs Intravenous Given 02/05/19 0008)  albuterol (VENTOLIN HFA) 108 (90 Base) MCG/ACT inhaler 2 puff (2 puffs Inhalation Given 02/05/19 0135)  methylPREDNISolone sodium succinate (SOLU-MEDROL) 125 mg/2 mL injection 60 mg (60 mg Intravenous Given 02/05/19 0134)  furosemide (LASIX) injection 40 mg (40 mg Intravenous Given 02/05/19 0135)  fentaNYL (SUBLIMAZE) injection 50 mcg (50 mcg Intravenous Given 02/05/19 0133)  albuterol (VENTOLIN HFA) 108 (90 Base) MCG/ACT inhaler 4 puff (4 puffs Inhalation Given 02/05/19 0224)    Mobility walks with person assist

## 2019-02-05 NOTE — ED Notes (Signed)
Admitting MD at bedside.

## 2019-02-05 NOTE — Plan of Care (Signed)
Working on stabilizing pt's blood glucose, she is on solu-medrol

## 2019-02-05 NOTE — ED Provider Notes (Signed)
[facility] DEPT Provider Note   CSN: [id number] Arrival date & time: 02/04/19  2315     History   Chief Complaint Chief Complaint  Patient presents with  . Chest Pain  . Shortness of Breath  . Cough    HPI Amyah L Seppala is a 63 y.o. female.     female with medical history significant of CKD stage IV, chronic diastolic CHF, diabetes mellitus, hypertension, coronary disease who presented with chest pain, back pain, shortness of breath and cough.  She reports this got significantly worse this evening.  She reports pain in the center of her chest that radiates to both of her sides and axilla and arms.  She is unable to tell me whether this feels similar to her previous heart attack type pain or feels similar to when she was hospitalized 3 weeks ago.  She reports the pain is worse with coughing and movement.  Her cough is nonproductive.  She does have history of COPD, CKD, diabetes and previous MI.  States she had a cardiac arrest in January 2019 but I cannot see records of this.  Her cough is nonproductive.  She denies fever.  She denies leg pain or leg swelling.  States compliance with her medications.  The history is provided by the patient.  Chest Pain Associated symptoms: cough and shortness of breath   Associated symptoms: no abdominal pain, no dizziness, no fever, no headache, no nausea, no vomiting and no weakness   Shortness of Breath Associated symptoms: chest pain and cough   Associated symptoms: no abdominal pain, no fever, no headaches, no rash and no vomiting   Cough Associated symptoms: chest pain, myalgias and shortness of breath   Associated symptoms: no fever, no headaches, no rash and no rhinorrhea     Past Medical History:  Diagnosis Date  . Anemia   . Anxiety   . CAD (coronary artery disease)   . CHF (congestive heart failure) ([place])   . CKD (chronic kidney disease)   . COPD (chronic obstructive pulmonary disease) ([place])   .  Depression   . Diabetes mellitus without complication ([place])   . Hypertension   . Hypothyroid   . MI (myocardial infarction) ([place])   . Sleep apnea    no cpap , "never gave me one"    Patient Active Problem List   Diagnosis Date Noted  . Community acquired pneumonia 08/13/2018  . Upper airway cough syndrome 07/04/2018  . CHF exacerbation ([place]) 06/27/2018  . Left shoulder pain 06/27/2018  . COPD exacerbation ([place]) 03/05/2018  . CAD (coronary artery disease) 03/05/2018  . Iron deficiency anemia 02/26/2018  . Sleep apnea 11/01/2017  . HLD (hyperlipidemia) 10/19/2017  . Anxiety and depression 10/19/2017  . Chest pain 10/19/2017  . Hypoglycemia   . Hypoxia   . Bronchitis 10/07/2017  . Chronic diastolic heart failure ([place]) 10/07/2017  . Acute renal failure superimposed on stage 3 chronic kidney disease ([place]) 10/07/2017  . Hypokalemia 10/07/2017  . Normocytic normochromic anemia 10/07/2017  . Type II diabetes mellitus with renal manifestations ([place])   . Leukocytosis   . Acute blood loss anemia   . Seizures ([place])   . Coronary artery disease involving native coronary artery of native heart with angina pectoris ([place]) 09/05/2017  . History of cardiac arrest 09/03/2017  . Cardiopulmonary arrest ([place])   . Acute encephalopathy   . Acute respiratory failure with hypoxia ([place])   . Acute on chronic combined systolic and diastolic CHF (  congestive heart failure) ([place]) 08/29/2017  . Essential hypertension 08/29/2017  . Type 1 diabetes mellitus without complication ([place]) 35/68/6168  . Tobacco abuse 08/29/2017  . Acute pulmonary edema (HCC)   . CKD (chronic kidney disease) stage 4, GFR 15-29 ml/min (HCC)   . Dyspnea on exertion     Past Surgical History:  Procedure Laterality Date  . APPENDECTOMY    . BACK SURGERY    . LEFT HEART CATH AND CORONARY ANGIOGRAPHY N/A 09/11/2017   Procedure: LEFT HEART CATH AND CORONARY ANGIOGRAPHY;  Surgeon: Martinique, Peter M, MD;  Location: [facility];   Service: Cardiovascular;  Laterality: N/A;  . TOTAL KNEE ARTHROPLASTY Right 2007     OB History   No obstetric history on file.      Home Medications    Prior to Admission medications   Medication Sig Start Date End Date Taking? Authorizing Provider  nitroGLYCERIN (NITROSTAT) 0.4 MG SL tablet Place 0.4 mg under the tongue every 5 (five) minutes as needed for chest pain.   Yes [provider]  potassium chloride SA (K-DUR,KLOR-CON) 10 MEQ tablet Take 1 tablet (10 mEq total) by mouth daily. 01/21/18  Yes Charlott Rakes, MD  ACCU-CHEK SOFTCLIX LANCETS lancets Use as instructed 10/16/17   Brayton Caves, PA-C  acetaminophen-codeine (TYLENOL #3) 300-30 MG tablet Take 1 tablet by mouth every 8 (eight) hours as needed for pain. 01/09/19   [provider]  albuterol (PROVENTIL HFA;VENTOLIN HFA) 108 (90 Base) MCG/ACT inhaler Inhale 1-2 puffs into the lungs every 6 (six) hours as needed for shortness of breath.    [provider]  amLODipine (NORVASC) 10 MG tablet Take 1 tablet (10 mg total) by mouth daily. MUST MAKE APPT FOR FURTHER REFILLS Patient taking differently: Take 10 mg by mouth daily.  06/24/18   Charlott Rakes, MD  aspirin EC 81 MG tablet Take 1 tablet (81 mg total) by mouth daily. Patient taking differently: Take 81 mg by mouth 2 (two) times daily.  10/16/17   Brayton Caves, PA-C  Blood Glucose Monitoring Suppl (TRUE METRIX METER) DEVI 1 kit by Does not apply route 4 (four) times daily. 10/16/17   Brayton Caves, PA-C  busPIRone (BUSPAR) 5 MG tablet Take 1 tablet (5 mg total) by mouth 3 (three) times daily. 01/21/18   Charlott Rakes, MD  carvedilol (COREG) 25 MG tablet TAKE 1 TABLET BY MOUTH 2 (TWO) TIMES DAILY WITH A MEAL. Patient taking differently: Take 25 mg by mouth 2 (two) times daily with a meal.  05/29/18   Charlott Rakes, MD  cetirizine (ZYRTEC) 10 MG tablet TAKE 1 TABLET (10 MG TOTAL) BY MOUTH DAILY. 03/11/18   Charlott Rakes, MD  [employer] 8HR MUSCLE  ACHES & PAIN 650 MG CR tablet Take 1,300 mg by mouth every 12 (twelve) hours. 11/19/18   [provider]  cyclobenzaprine (FLEXERIL) 10 MG tablet Take 1 tablet (10 mg total) by mouth 3 (three) times daily as needed for muscle spasms. 06/28/18   Purohit, Konrad Dolores, MD  diphenhydrAMINE (BENADRYL) 25 MG tablet Take 25 mg by mouth every 6 (six) hours as needed for itching or allergies.    [provider]  ferrous sulfate 325 (65 FE) MG tablet Take 325 mg by mouth 3 (three) times daily. 01/02/19   [provider]  FLUoxetine (PROZAC) 20 MG capsule Take 20 mg by mouth daily. 05/13/18   [provider]  fluticasone (FLONASE) 50 MCG/ACT nasal spray Place 2 sprays into both  nostrils daily as needed for allergies or rhinitis. 12/15/18   [provider]  furosemide (LASIX) 40 MG tablet Take 2 tablets (80 mg total) by mouth every morning AND 1 tablet (40 mg total) every evening. 02/24/18 02/19/19  Croitoru, Mihai, MD  gabapentin (NEURONTIN) 800 MG tablet Take 800 mg by mouth 3 (three) times daily as needed (pain).  08/30/18   [provider]  glucose blood (ACCU-CHEK AVIVA) test strip Use as instructed 10/16/17   Brayton Caves, PA-C  glucose blood (TRUE METRIX BLOOD GLUCOSE TEST) test strip Use as instructed 10/16/17   Brayton Caves, PA-C  hydrALAZINE (APRESOLINE) 100 MG tablet Take 1 tablet (100 mg total) by mouth 3 (three) times daily. 01/21/18   Charlott Rakes, MD  hydrOXYzine (VISTARIL) 25 MG capsule Take 25-50 mg by mouth every 6 (six) hours as needed for anxiety.    [provider]  Hyoscyamine Sulfate SL (LEVSIN/SL) 0.125 MG SUBL Place 1 tablet under the tongue every 4 (four) hours as needed. Patient taking differently: Place 1 tablet under the tongue every 4 (four) hours as needed (for abdominal cramping).  05/07/18   Charlann Lange, PA-C  Insulin Glargine (LANTUS SOLOSTAR) 100 UNIT/ML Solostar Pen Inject 20 Units into the skin 2 (two) times daily. Patient  taking differently: Inject 20-30 Units into the skin 3 (three) times daily. Take 30 units in the morning and at noon. Take 20 units with supper 01/21/18   Charlott Rakes, MD  insulin lispro (HUMALOG KWIKPEN) 100 UNIT/ML KwikPen Inject 4 Units into the skin 3 (three) times daily.    [provider]  Insulin Pen Needle (TRUEPLUS PEN NEEDLES) 31G X 6 MM MISC Use as directed 11/08/17   Charlott Rakes, MD  loratadine (CLARITIN) 10 MG tablet Take 10 mg by mouth daily. Allergies and congestion. 12/24/18   [provider]  losartan (COZAAR) 100 MG tablet Take 100 mg by mouth daily. 05/13/18   [provider]  montelukast (SINGULAIR) 10 MG tablet Take 1 tablet (10 mg total) by mouth at bedtime. 01/21/18   Charlott Rakes, MD  Multiple Vitamin (MULTIVITAMIN) tablet Take 1 tablet by mouth daily.    [provider]  rosuvastatin (CRESTOR) 40 MG tablet Take 1 tablet (40 mg total) by mouth daily. Patient taking differently: Take 40 mg by mouth at bedtime.  01/21/18 01/17/19  Charlott Rakes, MD  senna (SENOKOT) 8.6 MG TABS tablet Take 2 tablets by mouth 2 (two) times a day.    [provider]  spironolactone (ALDACTONE) 50 MG tablet Take 1 tablet (50 mg total) by mouth daily. 01/21/18   Charlott Rakes, MD  traMADol (ULTRAM) 50 MG tablet Take 50-100 mg by mouth every 4 (four) hours as needed for pain. Max: 8 tabs/day. 12/08/18   [provider]  TRUEPLUS LANCETS 28G MISC 28 g by Does not apply route 4 (four) times daily. 10/16/17   Brayton Caves, PA-C    Family History Family History  Problem Relation Age of Onset  . Hypertension Mother   . Colon cancer Neg Hx   . Esophageal cancer Neg Hx     Social History Social History   Tobacco Use  . Smoking status: Never Smoker  . Smokeless tobacco: Never Used  Substance Use Topics  . Alcohol use: No    Frequency: Never  . Drug use: No     Allergies   Eggs or egg-derived products, Azithromycin, Cherry, and  Latex   Review of Systems Review of  Systems  Constitutional: Negative for activity change, appetite change and fever.  HENT: Negative for congestion and rhinorrhea.   Eyes: Negative for visual disturbance.  Respiratory: Positive for cough, chest tightness and shortness of breath.   Cardiovascular: Positive for chest pain.  Gastrointestinal: Negative for abdominal pain, nausea and vomiting.  Genitourinary: Negative for dysuria and hematuria.  Musculoskeletal: Positive for arthralgias and myalgias.  Skin: Negative for rash.  Neurological: Negative for dizziness, weakness and headaches.   all other systems are negative except as noted in the HPI and PMH.     Physical Exam Updated Vital Signs BP (!) 146/118 (BP Location: Right Arm)   Pulse 99   Temp 98 F (36.7 C) (Oral)   Resp 20   SpO2 92%   Physical Exam Vitals signs and nursing note reviewed.  Constitutional:      General: She is not in acute distress.    Appearance: She is well-developed. She is obese.     Comments: Tearful and anxious  Difficult historian And will not answer questions directly  HENT:     Head: Normocephalic and atraumatic.     Mouth/Throat:     Pharynx: No oropharyngeal exudate.  Eyes:     Conjunctiva/sclera: Conjunctivae normal.     Pupils: Pupils are equal, round, and reactive to light.  Neck:     Musculoskeletal: Normal range of motion and neck supple.     Comments: No meningismus. Cardiovascular:     Rate and Rhythm: Normal rate and regular rhythm.     Heart sounds: Normal heart sounds. No murmur.     Comments: Equal upper extremity grip strengths and radial pulses Pulmonary:     Effort: Pulmonary effort is normal. No respiratory distress.     Breath sounds: Wheezing present.  Abdominal:     Palpations: Abdomen is soft.     Tenderness: There is no abdominal tenderness. There is no guarding or rebound.  Musculoskeletal: Normal range of motion.        General: No tenderness.  Skin:     General: Skin is warm.     Capillary Refill: Capillary refill takes less than 2 seconds.  Neurological:     General: No focal deficit present.     Mental Status: She is alert and oriented to person, place, and time. Mental status is at baseline.     Cranial Nerves: No cranial nerve deficit.     Motor: No abnormal muscle tone.     Coordination: Coordination normal.     Comments:  5/5 strength throughout. CN 2-12 intact.Equal grip strength.   Psychiatric:        Behavior: Behavior normal.      ED Treatments / Results  Labs (all labs ordered are listed, but only abnormal results are displayed) Labs Reviewed  BASIC METABOLIC PANEL - Abnormal; Notable for the following components:      Result Value   Glucose, Bld 203 (*)    BUN 73 (*)    Creatinine, Ser 3.12 (*)    GFR calc non Af Amer 15 (*)    GFR calc Af Amer 18 (*)    All other components within normal limits  CBC - Abnormal; Notable for the following components:   WBC 15.9 (*)    Hemoglobin 10.8 (*)    HCT 35.3 (*)    RDW 17.4 (*)    All other components within normal limits  D-DIMER, QUANTITATIVE (NOT AT [facility]) - Abnormal; Notable for the following components:  D-Dimer, Quant 0.52 (*)    All other components within normal limits  SARS CORONAVIRUS 2 (HOSPITAL ORDER, [place] [facility])  TROPONIN I (HIGH SENSITIVITY)  TROPONIN I (HIGH SENSITIVITY)  BRAIN NATRIURETIC PEPTIDE    EKG EKG Interpretation  Date/Time:  Wednesday February 04 2019 23:55:36 EDT Ventricular Rate:  96 PR Interval:    QRS Duration: 90 QT Interval:  360 QTC Calculation: 455 R Axis:   -79 Text Interpretation:  Sinus rhythm Probable left atrial enlargement Inferior infarct, old Consider anterior infarct T waves now upright  Confirmed by Ezequiel Essex [phone]) on 02/05/2019 12:24:48 AM   Radiology Dg Chest 2 View  Result Date: 02/05/2019 CLINICAL DATA:  Chest and upper back pain EXAM: CHEST - 2 VIEW COMPARISON:  01/16/2019  FINDINGS: There is mild cardiomegaly. There are bilateral basilar predominant interstitial opacities. No pneumothorax or sizable pleural effusion. IMPRESSION: Mild cardiomegaly with bibasilar opacities, likely atelectasis or mild interstitial pulmonary edema. Electronically Signed   By: Ulyses Jarred M.D.   On: 02/05/2019 01:06    Procedures Procedures (including critical care time)  Medications Ordered in ED Medications  sodium chloride flush (NS) 0.9 % injection 3 mL (3 mLs Intravenous Given 02/05/19 0008)  albuterol (VENTOLIN HFA) 108 (90 Base) MCG/ACT inhaler 2 puff (2 puffs Inhalation Given 02/05/19 0135)  methylPREDNISolone sodium succinate (SOLU-MEDROL) 125 mg/2 mL injection 60 mg (60 mg Intravenous Given 02/05/19 0134)  furosemide (LASIX) injection 40 mg (40 mg Intravenous Given 02/05/19 0135)  fentaNYL (SUBLIMAZE) injection 50 mcg (50 mcg Intravenous Given 02/05/19 0133)     Initial Impression / Assessment and Plan / ED Course  I have reviewed the triage vital signs and the nursing notes.  Pertinent labs & imaging results that were available during my care of the patient were reviewed by me and considered in my medical decision making (see chart for details).       Patient with history of COPD, CHF, and nonobstructive CAD with chest pain or shortness of breath worsening over the past day.  Her EKG shows T waves now upright stable inferolateral Q waves.  Patient wheezing on exam with hypoxia in the 80s.  She is given bronchodilators, steroids and magnesium as well as IV Lasix.  Chest x-ray does show some edema.  EKG did not show acute ischemia.  Patient's initial troponin is negative.  Age-adjusted d-dimer is negative.  Low suspicion for pulmonary embolism or aortic dissection. Equal upper extremity radial pulses and grip strengths.    Her creatinine is slightly worse than baseline.  She was given nebulizers, steroids and magnesium as well as IV Lasix.  She will be treated for COPD  exacerbation as well as CHF exacerbation.  Low suspicion for ACS.  With her new oxygen requirement she will need admission.  Discussed with Dr. Maudie Mercury      Final Clinical Impressions(s) / ED Diagnoses   Final diagnoses:  COPD exacerbation [facility])  Chest pain, unspecified type    ED Discharge Orders    None       Waldon Sheerin, Annie Main, MD 02/05/19 0600

## 2019-02-05 NOTE — Progress Notes (Signed)
0335 Patient arrived via stretcher from ED to room 1438, belongings with patient. Report had been received by ED nurse, Judson Roch at [phone]. Tele monitor applied and verified with tele monitoring personnel, Saralyn Pilar and charge nurse 2nd pt identifier, Lesly Rubenstein. Skin assessment completed and verified with 2nd nurse witness, Lesly Rubenstein. Patient drowsy, oriented x4 and able to participate in admission assessment and history. Safety measures in place.

## 2019-02-05 NOTE — ED Notes (Signed)
Patient transported to X-ray 

## 2019-02-05 NOTE — ED Notes (Signed)
Left arm bp- 108/52 (69) Right arm bp- 129/69 (89)

## 2019-02-05 NOTE — ED Notes (Addendum)
Pt stated she took 3 lasix last night and 3 this morning.

## 2019-02-05 NOTE — H&P (Addendum)
TRH H&P    Patient Demographics:    Brenda Sims, is a 63 y.o. female  MRN: [id number]  DOB - [birth year]  Admit Date - 02/04/2019  Referring MD/NP/PA: Ezequiel Essex  Outpatient Primary MD for the patient is Leonard Downing, MD  Patient coming from:  home  Chief complaint- chest pain, and cough and dyspnea   HPI:    Brenda Sims  is a 63 y.o. female,w hypertension, Dm2, CKD stage4, CAD, chronic diastolic CHF,c/o chest pain, substernal , sharp, worse with coughing for the past 1-2 days.  Pt states that has cough w yellow sputum.  Slight dyspnea and wheezing.  Denies fever, chills, palp, n/v, abd pain, diarrhea, brbpr, dysuria.  Pt came to ED due to chest pain, and dyspnea/ wheezing.   In ED,  T 98.4, P 86  R 25, Bp 106/53  pox 90%  Wbc 15.9, Hgb 10.8, Plt 260 Na 138, K 4.7, Bun 73, Creatinine 3.12 Glucose 203  Trop 7 D dimer 0.52 BNP 40.4  CXR IMPRESSION: Mild cardiomegaly with bibasilar opacities, likely atelectasis or mild interstitial pulmonary edema  Covid 19 negative  Prior cardiac cath 09/11/2017 =>   Ost Ramus to Ramus lesion is 70% stenosed.  Prox Cx to Mid Cx lesion is 65% stenosed.  Mid RCA to Dist RCA lesion is 50% stenosed.  LV end diastolic pressure is mildly elevated.  There is moderate left ventricular systolic dysfunction.  The left ventricular ejection fraction is 35-45% by visual estimate.   1. Moderate nonobstructive CAD 2. Moderate LV dysfunction. 3. Mildly elevated LVEDP   Pt will be admitted for chest pain, and cough/ dyspnea/ wheezing likely secondary to asthma exacerbation.     Review of systems:    In addition to the HPI above,  No Fever-chills, No Headache, No changes with Vision or hearing, No problems swallowing food or Liquids,  No Abdominal pain, No Nausea or Vomiting, bowel movements are regular, No Blood in stool or Urine, No dysuria,  No new skin rashes or bruises, No new joints pains-aches,  No new weakness, tingling, numbness in any extremity, No recent weight gain or loss, No polyuria, polydypsia or polyphagia, No significant Mental Stressors.  All other systems reviewed and are negative.    Past History of the following :    Past Medical History:  Diagnosis Date  . Anemia   . Anxiety   . CAD (coronary artery disease)   . CHF (congestive heart failure) ([place])   . CKD (chronic kidney disease)   . COPD (chronic obstructive pulmonary disease) ([place])   . Depression   . Diabetes mellitus without complication ([place])   . Hypertension   . Hypothyroid   . MI (myocardial infarction) ([place])   . Sleep apnea    no cpap , "never gave me one"      Past Surgical History:  Procedure Laterality Date  . APPENDECTOMY    . BACK SURGERY    . LEFT HEART CATH AND CORONARY ANGIOGRAPHY N/A 09/11/2017   Procedure: LEFT HEART CATH AND  CORONARY ANGIOGRAPHY;  Surgeon: Martinique, Peter M, MD;  Location: [facility];  Service: Cardiovascular;  Laterality: N/A;  . TOTAL KNEE ARTHROPLASTY Right 2007      Social History:      Social History   Tobacco Use  . Smoking status: Never Smoker  . Smokeless tobacco: Never Used  Substance Use Topics  . Alcohol use: No    Frequency: Never       Family History :     Family History  Problem Relation Age of Onset  . Hypertension Mother   . Colon cancer Neg Hx   . Esophageal cancer Neg Hx       Home Medications:   Prior to Admission medications   Medication Sig Start Date End Date Taking? Authorizing Provider  acetaminophen-codeine (TYLENOL #3) 300-30 MG tablet Take 1 tablet by mouth every 8 (eight) hours as needed for pain. 01/09/19  Yes [provider]  albuterol (PROVENTIL HFA;VENTOLIN HFA) 108 (90 Base) MCG/ACT inhaler Inhale 1-2 puffs into the lungs every 6 (six) hours as needed for shortness of breath.   Yes [provider]  amLODipine (NORVASC) 10  MG tablet Take 1 tablet (10 mg total) by mouth daily. MUST MAKE APPT FOR FURTHER REFILLS Patient taking differently: Take 10 mg by mouth daily.  06/24/18  Yes Charlott Rakes, MD  aspirin EC 81 MG tablet Take 1 tablet (81 mg total) by mouth daily. Patient taking differently: Take 81 mg by mouth 2 (two) times daily.  10/16/17  Yes Noel, Tiffany S, PA-C  carvedilol (COREG) 25 MG tablet TAKE 1 TABLET BY MOUTH 2 (TWO) TIMES DAILY WITH A MEAL. Patient taking differently: Take 25 mg by mouth 2 (two) times daily with a meal.  05/29/18  Yes Newlin, Enobong, MD  cetirizine (ZYRTEC) 10 MG tablet TAKE 1 TABLET (10 MG TOTAL) BY MOUTH DAILY. 03/11/18  Yes Charlott Rakes, MD  diphenhydrAMINE (BENADRYL) 25 MG tablet Take 25 mg by mouth every 6 (six) hours as needed for itching or allergies.   Yes [provider]  ferrous sulfate 325 (65 FE) MG tablet Take 325 mg by mouth 3 (three) times daily. 01/02/19  Yes [provider]  FLUoxetine (PROZAC) 20 MG capsule Take 20 mg by mouth daily. 05/13/18  Yes [provider]  fluticasone (FLONASE) 50 MCG/ACT nasal spray Place 2 sprays into both nostrils daily as needed for allergies or rhinitis. 12/15/18  Yes [provider]  gabapentin (NEURONTIN) 800 MG tablet Take 800 mg by mouth 3 (three) times daily as needed (pain).  08/30/18  Yes [provider]  hydrALAZINE (APRESOLINE) 100 MG tablet Take 1 tablet (100 mg total) by mouth 3 (three) times daily. 01/21/18  Yes Charlott Rakes, MD  Hyoscyamine Sulfate SL (LEVSIN/SL) 0.125 MG SUBL Place 1 tablet under the tongue every 4 (four) hours as needed. Patient taking differently: Place 1 tablet under the tongue every 4 (four) hours as needed (for abdominal cramping).  05/07/18  Yes Upstill, Nehemiah Settle, PA-C  Insulin Glargine (LANTUS SOLOSTAR) 100 UNIT/ML Solostar Pen Inject 20 Units into the skin 2 (two) times daily. Patient taking differently: Inject 20-30 Units into the skin 3 (three) times daily.  Take 30 units in the morning and at noon. Take 20 units with supper 01/21/18  Yes Newlin, Enobong, MD  insulin lispro (HUMALOG KWIKPEN) 100 UNIT/ML KwikPen Inject 4 Units into the skin 3 (three) times daily.   Yes [provider]  montelukast (SINGULAIR) 10 MG tablet Take 1  tablet (10 mg total) by mouth at bedtime. 01/21/18  Yes Charlott Rakes, MD  Multiple Vitamin (MULTIVITAMIN) tablet Take 1 tablet by mouth daily.   Yes [provider]  nitroGLYCERIN (NITROSTAT) 0.4 MG SL tablet Place 0.4 mg under the tongue every 5 (five) minutes as needed for chest pain.   Yes [provider]  potassium chloride SA (K-DUR,KLOR-CON) 10 MEQ tablet Take 1 tablet (10 mEq total) by mouth daily. 01/21/18  Yes Charlott Rakes, MD  senna (SENOKOT) 8.6 MG TABS tablet Take 2 tablets by mouth 2 (two) times a day.   Yes [provider]  spironolactone (ALDACTONE) 50 MG tablet Take 1 tablet (50 mg total) by mouth daily. 01/21/18  Yes Charlott Rakes, MD  ACCU-CHEK SOFTCLIX LANCETS lancets Use as instructed 10/16/17   Brayton Caves, PA-C  Blood Glucose Monitoring Suppl (TRUE METRIX METER) DEVI 1 kit by Does not apply route 4 (four) times daily. 10/16/17   Brayton Caves, PA-C  busPIRone (BUSPAR) 5 MG tablet Take 1 tablet (5 mg total) by mouth 3 (three) times daily. 01/21/18   Charlott Rakes, MD  cyclobenzaprine (FLEXERIL) 10 MG tablet Take 1 tablet (10 mg total) by mouth 3 (three) times daily as needed for muscle spasms. Patient not taking: Reported on 02/05/2019 06/28/18   Purohit, Konrad Dolores, MD  furosemide (LASIX) 40 MG tablet Take 2 tablets (80 mg total) by mouth every morning AND 1 tablet (40 mg total) every evening. 02/24/18 02/19/19  Croitoru, Mihai, MD  glucose blood (ACCU-CHEK AVIVA) test strip Use as instructed 10/16/17   Brayton Caves, PA-C  glucose blood (TRUE METRIX BLOOD GLUCOSE TEST) test strip Use as instructed 10/16/17   Brayton Caves, PA-C  Insulin Pen Needle (TRUEPLUS PEN NEEDLES)  31G X 6 MM MISC Use as directed 11/08/17   Charlott Rakes, MD  losartan (COZAAR) 100 MG tablet Take 100 mg by mouth daily. 05/13/18   [provider]  rosuvastatin (CRESTOR) 40 MG tablet Take 1 tablet (40 mg total) by mouth daily. Patient taking differently: Take 40 mg by mouth at bedtime.  01/21/18 01/17/19  Charlott Rakes, MD  TRUEPLUS LANCETS 28G MISC 28 g by Does not apply route 4 (four) times daily. 10/16/17   Brayton Caves, PA-C     Allergies:     Allergies  Allergen Reactions  . Eggs Or Egg-Derived Products Shortness Of Breath and Rash  . Azithromycin Swelling    Facial/throat swelling  . Cherry Swelling    Facial swelling  . Latex Rash     Physical Exam:   Vitals  Blood pressure (!) 107/53, pulse 86, temperature 98 F (36.7 C), temperature source Oral, resp. rate 16, SpO2 90 %.  1.  General: aoxo3  2. Psychiatric: euthymic  3. Neurologic: cn2-12 intact, reflexes 2+ symmetric, diffuse with no clonus, motor 5/5 in all 4 ext  4. HEENMT:  Anicteric, pupils 1.7m symmetric, direct, consensual intact Neck: no jvd  5. Respiratory : + bilateral exp wheezing, no crackles  6. Cardiovascular : rrr s1, s2, no m/g/r  7. Gastrointestinal:  Abd: soft, nt, nd, +bs  8. Skin:  Ext: no c/c/e,  No rash  9.Musculoskeletal:  Good ROM  No adenopathy    Data Review:    CBC Recent Labs  Lab 02/04/19 2359  WBC 15.9*  HGB 10.8*  HCT 35.3*  PLT 260  MCV 85.7  MCH 26.2  MCHC 30.6  RDW 17.4*   ------------------------------------------------------------------------------------------------------------------  Results for orders placed  or performed during the hospital encounter of 02/04/19 (from the past 48 hour(s))  Basic metabolic panel     Status: Abnormal   Collection Time: 02/04/19 11:59 PM  Result Value Ref Range   Sodium 138 135 - 145 mmol/L   Potassium 4.7 3.5 - 5.1 mmol/L   Chloride 104 98 - 111 mmol/L   CO2 23 22 - 32 mmol/L   Glucose, Bld 203  (H) 70 - 99 mg/dL   BUN 73 (H) 8 - 23 mg/dL   Creatinine, Ser 3.12 (H) 0.44 - 1.00 mg/dL   Calcium 9.6 8.9 - 10.3 mg/dL   GFR calc non Af Amer 15 (L) >60 mL/min   GFR calc Af Amer 18 (L) >60 mL/min   Anion gap 11 5 - 15    Comment: Performed at [facility], [place] [address] [place], [place] [address]  CBC     Status: Abnormal   Collection Time: 02/04/19 11:59 PM  Result Value Ref Range   WBC 15.9 (H) 4.0 - 10.5 K/uL   RBC 4.12 3.87 - 5.11 MIL/uL   Hemoglobin 10.8 (L) 12.0 - 15.0 g/dL   HCT 35.3 (L) 36.0 - 46.0 %   MCV 85.7 80.0 - 100.0 fL   MCH 26.2 26.0 - 34.0 pg   MCHC 30.6 30.0 - 36.0 g/dL   RDW 17.4 (H) 11.5 - 15.5 %   Platelets 260 150 - 400 K/uL   nRBC 0.0 0.0 - 0.2 %    Comment: Performed at [facility], [place] [address], [place], [place] [address]  Troponin I (High Sensitivity)     Status: None   Collection Time: 02/04/19 11:59 PM  Result Value Ref Range   Troponin I (High Sensitivity) 7 <18 ng/L    Comment: (NOTE) Elevated high sensitivity troponin I (hsTnI) values and significant  changes across serial measurements may suggest ACS but many other  chronic and acute conditions are known to elevate hsTnI results.  Refer to the "Links" section for chest pain algorithms and additional  guidance. Performed at [facility], [place] [address], [place], [place] [address]   Troponin I (High Sensitivity)     Status: None   Collection Time: 02/05/19 12:11 AM  Result Value Ref Range   Troponin I (High Sensitivity) 5.0 <18 ng/L    Comment: (NOTE) Elevated high sensitivity troponin I (hsTnI) values and significant  changes across serial measurements may suggest ACS but many other  chronic and acute conditions are known to elevate hsTnI results.  Refer to the "Links" section for chest pain algorithms and additional  guidance. Performed at [facility], [place] [address], [place], [place] [address]    D-dimer, quantitative (not at [facility])     Status: Abnormal   Collection Time: 02/05/19 12:11 AM  Result Value Ref Range   D-Dimer, Quant 0.52 (H) 0.00 - 0.50 ug/mL-FEU    Comment: (NOTE) At the manufacturer cut-off of 0.50 ug/mL FEU, this assay has been documented to exclude PE with a sensitivity and negative predictive value of 97 to 99%.  At this time, this assay has not been approved by the FDA to exclude DVT/VTE. Results should be correlated with clinical presentation. Performed at [facility], [place] [address] [place], [place] [address]   Brain natriuretic peptide     Status: None   Collection Time: 02/05/19 12:58 AM  Result Value Ref Range   B Natriuretic Peptide 40.4 0.0 - 100.0 pg/mL  Comment: Performed at [facility], [place] [address], [place], [place] [address]    Chemistries  Recent Labs  Lab 02/04/19 2359  NA 138  K 4.7  CL 104  CO2 23  GLUCOSE 203*  BUN 73*  CREATININE 3.12*  CALCIUM 9.6   ------------------------------------------------------------------------------------------------------------------  ------------------------------------------------------------------------------------------------------------------ GFR: CrCl cannot be calculated (Unknown ideal weight.). Liver Function Tests: No results for input(s): AST, ALT, ALKPHOS, BILITOT, PROT, ALBUMIN in the last 168 hours. No results for input(s): LIPASE, AMYLASE in the last 168 hours. No results for input(s): AMMONIA in the last 168 hours. Coagulation Profile: No results for input(s): INR, PROTIME in the last 168 hours. Cardiac Enzymes: No results for input(s): CKTOTAL, CKMB, CKMBINDEX, TROPONINI in the last 168 hours. BNP (last 3 results) No results for input(s): PROBNP in the last 8760 hours. HbA1C: No results for input(s): HGBA1C in the last 72 hours. CBG: No results for input(s): GLUCAP in the last 168 hours. Lipid Profile: No results for input(s):  CHOL, HDL, LDLCALC, TRIG, CHOLHDL, LDLDIRECT in the last 72 hours. Thyroid Function Tests: No results for input(s): TSH, T4TOTAL, FREET4, T3FREE, THYROIDAB in the last 72 hours. Anemia Panel: No results for input(s): VITAMINB12, FOLATE, FERRITIN, TIBC, IRON, RETICCTPCT in the last 72 hours.  --------------------------------------------------------------------------------------------------------------- Urine analysis:    Component Value Date/Time   COLORURINE STRAW (A) 08/14/2018 [place] 08/14/2018 1037   LABSPEC 1.010 08/14/2018 1037   PHURINE 6.0 08/14/2018 1037   GLUCOSEU NEGATIVE 08/14/2018 1037   HGBUR NEGATIVE 08/14/2018 1037   BILIRUBINUR NEGATIVE 08/14/2018 1037   KETONESUR NEGATIVE 08/14/2018 1037   PROTEINUR NEGATIVE 08/14/2018 1037   NITRITE NEGATIVE 08/14/2018 1037   LEUKOCYTESUR NEGATIVE 08/14/2018 1037      Imaging Results:    Dg Chest 2 View  Result Date: 02/05/2019 CLINICAL DATA:  Chest and upper back pain EXAM: CHEST - 2 VIEW COMPARISON:  01/16/2019 FINDINGS: There is mild cardiomegaly. There are bilateral basilar predominant interstitial opacities. No pneumothorax or sizable pleural effusion. IMPRESSION: Mild cardiomegaly with bibasilar opacities, likely atelectasis or mild interstitial pulmonary edema. Electronically Signed   By: Ulyses Jarred M.D.   On: 02/05/2019 01:06    ekg nsr at 95, nl axis, q in 2,3, avf, poor R progression (no sig change from 10/11/18)   Assessment & Plan:    Principal Problem:   Chest pain Active Problems:   Essential hypertension   Type 1 diabetes mellitus without complication (HCC)   CKD (chronic kidney disease) stage 4, GFR 15-29 ml/min (HCC)   Sleep apnea  Chest pain, slightly atypical since associated with cough Tele Trop I q2hx2 Check cardiac echo Consider cardiology consult if not improving  Cough/ Dyspnea secondary to Asthma exacerbation Solumedrol 51m iv q6h  Doxycycline 1053miv bid Albuterol  neb q6h and q6h prn   Type 1 Dm2 w CKD stage4 Cont Lantus 30 units [place] qam, and 20units [place] qpm fsbs ac and qhs, ISS Check cmp in am  Diabetic neuropathy Cont Neurontin 80029mo tid=> 400m67m tid, may need to reduce further since has CKD stage4.   CAD moderate, h/o CHF (systolic) Cont aspirin 81mg35TDqday Cont Carvedilol 25mg8mbid Cont Lasix 80mg 41mid Cont Hydralazine 100mg p74md Cont Amlodipine 10mg po37my Cont Losartan 100mg po 53m Cont Kcl 10meq qda22mcrease Crestor from 40mg po qd81mo 10mg po qda25mhen creatinine clearance <30 then can use max dose 10mg po qday46memia Cont Ferrous sulfate 325mg34m  po qday  DepressionAnxiety Cont Fluoxetine 52m po qday Cont Buspar 552mtid  DVT Prophylaxis-   Lovenox - SCDs   AM Labs Ordered, also please review Full Orders  Family Communication: Admission, patients condition and plan of care including tests being ordered have been discussed with the patient who indicate understanding and agree with the plan and Code Status.  Code Status:  FULL CODE  Admission status: Observation : Based on patients clinical presentation and evaluation of above clinical data, I have made determination that patient meets observation criteria at this time.  Time spent in minutes : 55 minutes   JaJani Gravel.D on 02/05/2019 at 2:54 AM

## 2019-02-05 NOTE — Progress Notes (Signed)
Patient refused lab draw.

## 2019-02-05 NOTE — Progress Notes (Addendum)
Patient seen and examined at bedside for shortness of breath diagnosed with Asthma exac, atypical chest pain and possible PNA.  Started on Solumedrol, Abx, and bronchidilators.  Patient seen and examined at bedside, Saturating 88% on RA. Productive coughing and mild dyspnea with exertion.   She has diffuse coarse BS on the physical exam. She is obese with BMI >35  At this time I will reduce her steroids to solumedrol 40mg  q8hrs, change Bronchodilators to Duonebs- scheduled and prn, add Pulmicort. IS and Flutter. Supportive care. Check pro Cal.  Trend CE. Cont Doxycyline.   Nurse at bedside during my eval. Advised to maintain 2L [place] today.   Call with questions.   Time Spent: 25 mins  Gerlean Ren MD [facility]

## 2019-02-05 NOTE — Progress Notes (Signed)
Floor coverage for Hospitalist notified of pt's HS/ CBG 541 via Amion

## 2019-02-06 DIAGNOSIS — I5022 Chronic systolic (congestive) heart failure: Secondary | ICD-10-CM | POA: Diagnosis present

## 2019-02-06 DIAGNOSIS — J441 Chronic obstructive pulmonary disease with (acute) exacerbation: Secondary | ICD-10-CM | POA: Diagnosis present

## 2019-02-06 DIAGNOSIS — R739 Hyperglycemia, unspecified: Secondary | ICD-10-CM

## 2019-02-06 DIAGNOSIS — G4730 Sleep apnea, unspecified: Secondary | ICD-10-CM | POA: Diagnosis present

## 2019-02-06 DIAGNOSIS — I200 Unstable angina: Secondary | ICD-10-CM | POA: Diagnosis not present

## 2019-02-06 DIAGNOSIS — R0789 Other chest pain: Secondary | ICD-10-CM | POA: Diagnosis present

## 2019-02-06 DIAGNOSIS — Z794 Long term (current) use of insulin: Secondary | ICD-10-CM | POA: Diagnosis not present

## 2019-02-06 DIAGNOSIS — F419 Anxiety disorder, unspecified: Secondary | ICD-10-CM | POA: Diagnosis present

## 2019-02-06 DIAGNOSIS — E1065 Type 1 diabetes mellitus with hyperglycemia: Secondary | ICD-10-CM | POA: Diagnosis present

## 2019-02-06 DIAGNOSIS — I10 Essential (primary) hypertension: Secondary | ICD-10-CM | POA: Diagnosis not present

## 2019-02-06 DIAGNOSIS — N184 Chronic kidney disease, stage 4 (severe): Secondary | ICD-10-CM | POA: Diagnosis present

## 2019-02-06 DIAGNOSIS — I252 Old myocardial infarction: Secondary | ICD-10-CM | POA: Diagnosis not present

## 2019-02-06 DIAGNOSIS — I130 Hypertensive heart and chronic kidney disease with heart failure and stage 1 through stage 4 chronic kidney disease, or unspecified chronic kidney disease: Secondary | ICD-10-CM | POA: Diagnosis present

## 2019-02-06 DIAGNOSIS — E039 Hypothyroidism, unspecified: Secondary | ICD-10-CM | POA: Diagnosis present

## 2019-02-06 DIAGNOSIS — E1121 Type 2 diabetes mellitus with diabetic nephropathy: Secondary | ICD-10-CM

## 2019-02-06 DIAGNOSIS — J449 Chronic obstructive pulmonary disease, unspecified: Secondary | ICD-10-CM | POA: Diagnosis present

## 2019-02-06 DIAGNOSIS — D631 Anemia in chronic kidney disease: Secondary | ICD-10-CM | POA: Diagnosis present

## 2019-02-06 DIAGNOSIS — E1022 Type 1 diabetes mellitus with diabetic chronic kidney disease: Secondary | ICD-10-CM | POA: Diagnosis present

## 2019-02-06 DIAGNOSIS — I2510 Atherosclerotic heart disease of native coronary artery without angina pectoris: Secondary | ICD-10-CM | POA: Diagnosis present

## 2019-02-06 DIAGNOSIS — Z8249 Family history of ischemic heart disease and other diseases of the circulatory system: Secondary | ICD-10-CM | POA: Diagnosis not present

## 2019-02-06 DIAGNOSIS — E669 Obesity, unspecified: Secondary | ICD-10-CM | POA: Diagnosis present

## 2019-02-06 DIAGNOSIS — Z96651 Presence of right artificial knee joint: Secondary | ICD-10-CM | POA: Diagnosis present

## 2019-02-06 DIAGNOSIS — Z20828 Contact with and (suspected) exposure to other viral communicable diseases: Secondary | ICD-10-CM | POA: Diagnosis present

## 2019-02-06 DIAGNOSIS — Z7982 Long term (current) use of aspirin: Secondary | ICD-10-CM | POA: Diagnosis not present

## 2019-02-06 DIAGNOSIS — F329 Major depressive disorder, single episode, unspecified: Secondary | ICD-10-CM | POA: Diagnosis present

## 2019-02-06 DIAGNOSIS — D509 Iron deficiency anemia, unspecified: Secondary | ICD-10-CM | POA: Diagnosis present

## 2019-02-06 DIAGNOSIS — J4531 Mild persistent asthma with (acute) exacerbation: Secondary | ICD-10-CM | POA: Diagnosis present

## 2019-02-06 DIAGNOSIS — E1042 Type 1 diabetes mellitus with diabetic polyneuropathy: Secondary | ICD-10-CM | POA: Diagnosis present

## 2019-02-06 DIAGNOSIS — Z6835 Body mass index (BMI) 35.0-35.9, adult: Secondary | ICD-10-CM | POA: Diagnosis not present

## 2019-02-06 LAB — BLOOD GAS, VENOUS
Acid-base deficit: 2.5 mmol/L — ABNORMAL HIGH (ref 0.0–2.0)
Bicarbonate: 22.4 mmol/L (ref 20.0–28.0)
O2 Saturation: 52.1 %
Patient temperature: 98.1
pCO2, Ven: 41 mmHg — ABNORMAL LOW (ref 44.0–60.0)
pH, Ven: 7.354 (ref 7.250–7.430)

## 2019-02-06 LAB — BASIC METABOLIC PANEL
Anion gap: 11 (ref 5–15)
Anion gap: 13 (ref 5–15)
Anion gap: 15 (ref 5–15)
Anion gap: 16 — ABNORMAL HIGH (ref 5–15)
Anion gap: 17 — ABNORMAL HIGH (ref 5–15)
BUN: 90 mg/dL — ABNORMAL HIGH (ref 8–23)
BUN: 91 mg/dL — ABNORMAL HIGH (ref 8–23)
BUN: 92 mg/dL — ABNORMAL HIGH (ref 8–23)
BUN: 93 mg/dL — ABNORMAL HIGH (ref 8–23)
BUN: 94 mg/dL — ABNORMAL HIGH (ref 8–23)
CO2: 16 mmol/L — ABNORMAL LOW (ref 22–32)
CO2: 17 mmol/L — ABNORMAL LOW (ref 22–32)
CO2: 18 mmol/L — ABNORMAL LOW (ref 22–32)
CO2: 19 mmol/L — ABNORMAL LOW (ref 22–32)
CO2: 19 mmol/L — ABNORMAL LOW (ref 22–32)
Calcium: 9 mg/dL (ref 8.9–10.3)
Calcium: 9.3 mg/dL (ref 8.9–10.3)
Calcium: 9.4 mg/dL (ref 8.9–10.3)
Calcium: 9.6 mg/dL (ref 8.9–10.3)
Calcium: 9.6 mg/dL (ref 8.9–10.3)
Chloride: 103 mmol/L (ref 98–111)
Chloride: 104 mmol/L (ref 98–111)
Chloride: 104 mmol/L (ref 98–111)
Chloride: 105 mmol/L (ref 98–111)
Chloride: 99 mmol/L (ref 98–111)
Creatinine, Ser: 3.13 mg/dL — ABNORMAL HIGH (ref 0.44–1.00)
Creatinine, Ser: 3.19 mg/dL — ABNORMAL HIGH (ref 0.44–1.00)
Creatinine, Ser: 3.31 mg/dL — ABNORMAL HIGH (ref 0.44–1.00)
Creatinine, Ser: 3.48 mg/dL — ABNORMAL HIGH (ref 0.44–1.00)
Creatinine, Ser: 3.65 mg/dL — ABNORMAL HIGH (ref 0.44–1.00)
GFR calc Af Amer: 15 mL/min — ABNORMAL LOW (ref >60)
GFR calc Af Amer: 15 mL/min — ABNORMAL LOW (ref >60)
GFR calc Af Amer: 16 mL/min — ABNORMAL LOW (ref >60)
GFR calc Af Amer: 17 mL/min — ABNORMAL LOW (ref >60)
GFR calc Af Amer: 17 mL/min — ABNORMAL LOW (ref >60)
GFR calc non Af Amer: 13 mL/min — ABNORMAL LOW (ref >60)
GFR calc non Af Amer: 13 mL/min — ABNORMAL LOW (ref >60)
GFR calc non Af Amer: 14 mL/min — ABNORMAL LOW (ref >60)
GFR calc non Af Amer: 15 mL/min — ABNORMAL LOW (ref >60)
GFR calc non Af Amer: 15 mL/min — ABNORMAL LOW (ref >60)
Glucose, Bld: 160 mg/dL — ABNORMAL HIGH (ref 70–99)
Glucose, Bld: 202 mg/dL — ABNORMAL HIGH (ref 70–99)
Glucose, Bld: 236 mg/dL — ABNORMAL HIGH (ref 70–99)
Glucose, Bld: 312 mg/dL — ABNORMAL HIGH (ref 70–99)
Glucose, Bld: 574 mg/dL (ref 70–99)
Potassium: 4.2 mmol/L (ref 3.5–5.1)
Potassium: 4.5 mmol/L (ref 3.5–5.1)
Potassium: 4.6 mmol/L (ref 3.5–5.1)
Potassium: 4.7 mmol/L (ref 3.5–5.1)
Potassium: 5.3 mmol/L — ABNORMAL HIGH (ref 3.5–5.1)
Sodium: 129 mmol/L — ABNORMAL LOW (ref 135–145)
Sodium: 135 mmol/L (ref 135–145)
Sodium: 136 mmol/L (ref 135–145)
Sodium: 138 mmol/L (ref 135–145)
Sodium: 138 mmol/L (ref 135–145)

## 2019-02-06 LAB — GLUCOSE, CAPILLARY
Glucose-Capillary: 472 mg/dL — ABNORMAL HIGH (ref 70–99)
Glucose-Capillary: 277 mg/dL — ABNORMAL HIGH (ref 70–99)
Glucose-Capillary: 315 mg/dL — ABNORMAL HIGH (ref 70–99)
Glucose-Capillary: 229 mg/dL — ABNORMAL HIGH (ref 70–99)
Glucose-Capillary: 215 mg/dL — ABNORMAL HIGH (ref 70–99)
Glucose-Capillary: 193 mg/dL — ABNORMAL HIGH (ref 70–99)
Glucose-Capillary: 154 mg/dL — ABNORMAL HIGH (ref 70–99)
Glucose-Capillary: 141 mg/dL — ABNORMAL HIGH (ref 70–99)
Glucose-Capillary: 166 mg/dL — ABNORMAL HIGH (ref 70–99)
Glucose-Capillary: 148 mg/dL — ABNORMAL HIGH (ref 70–99)
Glucose-Capillary: 230 mg/dL — ABNORMAL HIGH (ref 70–99)
Glucose-Capillary: 221 mg/dL — ABNORMAL HIGH (ref 70–99)
Glucose-Capillary: 220 mg/dL — ABNORMAL HIGH (ref 70–99)
Glucose-Capillary: 217 mg/dL — ABNORMAL HIGH (ref 70–99)
Glucose-Capillary: 275 mg/dL — ABNORMAL HIGH (ref 70–99)
Glucose-Capillary: 236 mg/dL — ABNORMAL HIGH (ref 70–99)
Glucose-Capillary: 166 mg/dL — ABNORMAL HIGH (ref 70–99)
Glucose-Capillary: 101 mg/dL — ABNORMAL HIGH (ref 70–99)
Glucose-Capillary: 115 mg/dL — ABNORMAL HIGH (ref 70–99)

## 2019-02-06 LAB — URINALYSIS, ROUTINE W REFLEX MICROSCOPIC
Bilirubin Urine: NEGATIVE
Glucose, UA: NEGATIVE mg/dL
Hgb urine dipstick: NEGATIVE
Ketones, ur: NEGATIVE mg/dL
Leukocytes,Ua: NEGATIVE
Nitrite: NEGATIVE
Protein, ur: NEGATIVE mg/dL
Specific Gravity, Urine: 1.009 (ref 1.005–1.030)
pH: 5 (ref 5.0–8.0)

## 2019-02-06 LAB — CBC
HCT: 35.3 % — ABNORMAL LOW (ref 36.0–46.0)
Hemoglobin: 11.3 g/dL — ABNORMAL LOW (ref 12.0–15.0)
MCH: 26.5 pg (ref 26.0–34.0)
MCHC: 32 g/dL (ref 30.0–36.0)
MCV: 82.7 fL (ref 80.0–100.0)
Platelets: 252 10*3/uL (ref 150–400)
RBC: 4.27 MIL/uL (ref 3.87–5.11)
RDW: 16.7 % — ABNORMAL HIGH (ref 11.5–15.5)
WBC: 26.7 10*3/uL — ABNORMAL HIGH (ref 4.0–10.5)
nRBC: 0 % (ref 0.0–0.2)

## 2019-02-06 LAB — MAGNESIUM: Magnesium: 1.9 mg/dL (ref 1.7–2.4)

## 2019-02-06 LAB — PHOSPHORUS: Phosphorus: 3.1 mg/dL (ref 2.5–4.6)

## 2019-02-06 LAB — MRSA PCR SCREENING: MRSA by PCR: NEGATIVE

## 2019-02-06 MED ORDER — METOCLOPRAMIDE HCL 5 MG/ML IJ SOLN
5.0000 mg | Freq: Four times a day (QID) | INTRAMUSCULAR | Status: DC | PRN
  Filled 2019-02-06: qty 2

## 2019-02-06 MED ORDER — DOXYCYCLINE HYCLATE 100 MG PO TABS
100.0000 mg | ORAL_TABLET | Freq: Two times a day (BID) | ORAL | Status: DC
  Administered 2019-02-06 – 2019-02-07 (×2): 100 mg via ORAL
  Filled 2019-02-06 (×2): qty 1

## 2019-02-06 MED ORDER — TRAMADOL HCL 50 MG PO TABS
50.0000 mg | ORAL_TABLET | Freq: Three times a day (TID) | ORAL | Status: DC | PRN
  Administered 2019-02-06: 50 mg via ORAL

## 2019-02-06 MED ORDER — IPRATROPIUM-ALBUTEROL 0.5-2.5 (3) MG/3ML IN SOLN
3.0000 mL | Freq: Two times a day (BID) | RESPIRATORY_TRACT | Status: DC
  Administered 2019-02-06 – 2019-02-07 (×2): 3 mL via RESPIRATORY_TRACT
  Filled 2019-02-06 (×2): qty 3

## 2019-02-06 MED ORDER — INSULIN REGULAR(HUMAN) IN NACL 100-0.9 UT/100ML-% IV SOLN
INTRAVENOUS | Status: DC
  Filled 2019-02-06: qty 100

## 2019-02-06 MED ORDER — GABAPENTIN 300 MG PO CAPS
300.0000 mg | ORAL_CAPSULE | Freq: Two times a day (BID) | ORAL | Status: DC
  Administered 2019-02-06: 10:00:00 300 mg via ORAL
  Filled 2019-02-06: qty 1

## 2019-02-06 MED ORDER — DEXTROSE-NACL 5-0.45 % IV SOLN
INTRAVENOUS | Status: DC
  Administered 2019-02-06: 14:00:00 via INTRAVENOUS

## 2019-02-06 MED ORDER — TRAMADOL HCL 50 MG PO TABS
50.0000 mg | ORAL_TABLET | Freq: Two times a day (BID) | ORAL | Status: DC
  Filled 2019-02-06: qty 1

## 2019-02-06 MED ORDER — INSULIN ASPART 100 UNIT/ML ~~LOC~~ SOLN
12.0000 [IU] | Freq: Once | SUBCUTANEOUS | Status: AC
  Administered 2019-02-06: 12 [IU] via SUBCUTANEOUS

## 2019-02-06 MED ORDER — POTASSIUM CHLORIDE 10 MEQ/100ML IV SOLN
10.0000 meq | INTRAVENOUS | Status: AC
  Administered 2019-02-06 (×2): 10 meq via INTRAVENOUS
  Filled 2019-02-06: qty 100

## 2019-02-06 MED ORDER — SODIUM CHLORIDE 0.9 % IV SOLN: INTRAVENOUS | Status: DC

## 2019-02-06 MED ORDER — SODIUM CHLORIDE 0.9 % IV SOLN: INTRAVENOUS | Status: AC

## 2019-02-06 MED ORDER — METHYLPREDNISOLONE SODIUM SUCC 40 MG IJ SOLR: 40.0000 mg | Freq: Every day | INTRAMUSCULAR | Status: DC

## 2019-02-06 MED ORDER — GABAPENTIN 400 MG PO CAPS
400.0000 mg | ORAL_CAPSULE | Freq: Two times a day (BID) | ORAL | Status: DC
  Administered 2019-02-06 – 2019-02-07 (×2): 400 mg via ORAL
  Filled 2019-02-06 (×2): qty 1

## 2019-02-06 NOTE — Progress Notes (Signed)
Glucomander was on non DKA protocol stopped and restarted on DKA protocol per Dr. Reesa Chew.

## 2019-02-06 NOTE — Progress Notes (Signed)
Patient upset agitated, trying to go to bathroom, pulled out IV .  Dr aware.  Now will not let IV be replaced or placed back on monitor until allowed to shower.  MD notified for shower orders

## 2019-02-06 NOTE — Progress Notes (Signed)
CRITICAL VALUE ALERT  Critical Value:blood glucose 574  Date & Time Notied:  02/05/2019 0015  Provider Notified: Hollace Hayward NP  Orders Received/Actions taken: transfer to stepdown for gluco stabilizer

## 2019-02-06 NOTE — Plan of Care (Signed)
Pt started on insulin drip for hyperglycemic level 541 and transferred to step down.

## 2019-02-06 NOTE — Progress Notes (Addendum)
PROGRESS NOTE    Brenda Sims  ZMO:[id number] DOB: 04-15-1956 DOA: 02/04/2019 PCP: Leonard Downing, MD   Brief Narrative:  63 year old with history of essential hypertension, diabetes mellitus type 2-insulin-dependent, CKD stage IV, asthma came to the hospital with complains of atypical chest pain and shortness of breath.  She was diagnosed with asthma exacerbation with atypical chest pain.  Cardiac causes were ruled out.  She was started on doxycycline, bronchodilators and IV steroids.  Hospital course complicated by uncontrolled blood glucose.   Assessment & Plan:   Principal Problem:   Chest pain Active Problems:   Essential hypertension   Type 1 diabetes mellitus without complication (HCC)   CKD (chronic kidney disease) stage 4, GFR 15-29 ml/min (HCC)   Type II diabetes mellitus with renal manifestations (HCC)   Sleep apnea   Asthma exacerbation  Acute exacerbation of mild persistent asthma -Appears to be improving.  This morning she is not wheezing; decrease Solu-Medrol daily -Continue bronchodilators-scheduled and as needed -Pulmicort -Incentive spirometry and flutter valve - Doxycycline day 2, complete 5-day course.  Diabetes mellitus type 2, uncontrolled.  Hyperglycemia without anion gap Peripheral neuropathy - Currently on insulin drip.  Will repeat her BMP at 11 AM.  If anion gap is normal, will transition to subcutaneous insulin.  Clear liquid diet, will advance as tolerated. -Hemoglobin A1c 9.8; 3 weeks ago - Consult diabetic coordinator - Continue gabapentin, reduced dose due to renal dysfunction.  CKD stage IV -Secondary to uncontrolled diabetes and hypertension.  Close to her baseline of 3.0.  Closely monitor renal function.  Avoid nephrotoxic drugs.  history of coronary artery disease Chronic systolic congestive heart failure -Currently appears to be compensated without any chest pain.  Will resume her home medications.  Anemia of chronic disease and  iron deficiency -Continue ferrous sulfate  History of depression and anxiety -Resume home meds.   DVT prophylaxis: Lovenox Code Status: Full code Family Communication: None at bedside Disposition Plan: Maintain hospital stay, wean off insulin drip.  Unsafe for discharge.  Consultants:   None  Procedures:   None  Antimicrobials:   Doxycycline day 2   Subjective: Overnight patient remained hyperglycemic therefore started on insulin drip.  This morning states feels little better in terms of breathing.  Review of Systems Otherwise negative except as per HPI, including: General: Denies fever, chills, night sweats or unintended weight loss. Resp: Denies cough, wheezing, shortness of breath. Cardiac: Denies chest pain, palpitations, orthopnea, paroxysmal nocturnal dyspnea. GI: Denies abdominal pain, nausea, vomiting, diarrhea or constipation GU: Denies dysuria, frequency, hesitancy or incontinence MS: Denies muscle aches, joint pain or swelling Neuro: Denies headache, neurologic deficits (focal weakness, numbness, tingling), abnormal gait Psych: Denies anxiety, depression, SI/HI/AVH Skin: Denies new rashes or lesions ID: Denies sick contacts, exotic exposures, travel  Objective: Vitals:   02/06/19 0703 02/06/19 0737 02/06/19 0900 02/06/19 1010  BP: (!) 103/48 (!) 113/37    Pulse: 77 80 82   Resp: (!) 22 17    Temp:      TempSrc:      SpO2: 96% 95% 92% 97%  Weight:      Height:        Intake/Output Summary (Last 24 hours) at 02/06/2019 1035 Last data filed at 02/06/2019 0905 Gross per 24 hour  Intake 1282.89 ml  Output 902 ml  Net 380.89 ml   Filed Weights   02/05/19 0516  Weight: 93.7 kg    Examination:  General exam: Appears calm and comfortable  Respiratory  system: Clear to auscultation. Respiratory effort normal. Cardiovascular system: S1 & S2 heard, RRR. No JVD, murmurs, rubs, gallops or clicks. No pedal edema. Gastrointestinal system: Abdomen is  nondistended, soft and nontender. No organomegaly or masses felt. Normal bowel sounds heard. Central nervous system: Alert and oriented. No focal neurological deficits. Extremities: Symmetric 5 x 5 power. Skin: No rashes, lesions or ulcers Psychiatry: Judgement and insight appear normal. Mood & affect appropriate.   Data Reviewed:   CBC: Recent Labs  Lab 02/04/19 2359 02/06/19 0248  WBC 15.9* 26.7*  HGB 10.8* 11.3*  HCT 35.3* 35.3*  MCV 85.7 82.7  PLT 260 030   Basic Metabolic Panel: Recent Labs  Lab 02/04/19 2359 02/05/19 1041 02/05/19 2334 02/06/19 0248 02/06/19 0540  NA 138 135 129* 136 138  K 4.7 5.3* 5.3* 4.6 4.7  CL 104 104 99 104 104  CO2 23 17* 19* 19* 18*  GLUCOSE 203* 428* 574* 312* 202*  BUN 73* 74* 91* 92* 90*  CREATININE 3.12* 3.22* 3.65* 3.48* 3.31*  CALCIUM 9.6 9.2 9.0 9.6 9.6  MG  --   --   --  1.9  --   PHOS  --   --   --  3.1  --    GFR: Estimated Creatinine Clearance: 19.3 mL/min (A) (by C-G formula based on SCr of 3.31 mg/dL (H)). Liver Function Tests: No results for input(s): AST, ALT, ALKPHOS, BILITOT, PROT, ALBUMIN in the last 168 hours. No results for input(s): LIPASE, AMYLASE in the last 168 hours. No results for input(s): AMMONIA in the last 168 hours. Coagulation Profile: No results for input(s): INR, PROTIME in the last 168 hours. Cardiac Enzymes: No results for input(s): CKTOTAL, CKMB, CKMBINDEX, TROPONINI in the last 168 hours. BNP (last 3 results) No results for input(s): PROBNP in the last 8760 hours. HbA1C: No results for input(s): HGBA1C in the last 72 hours. CBG: Recent Labs  Lab 02/06/19 0531 02/06/19 0632 02/06/19 0759 02/06/19 0852 02/06/19 1017  GLUCAP 215* 193* 154* 141* 166*   Lipid Profile: No results for input(s): CHOL, HDL, LDLCALC, TRIG, CHOLHDL, LDLDIRECT in the last 72 hours. Thyroid Function Tests: No results for input(s): TSH, T4TOTAL, FREET4, T3FREE, THYROIDAB in the last 72 hours. Anemia Panel: No  results for input(s): VITAMINB12, FOLATE, FERRITIN, TIBC, IRON, RETICCTPCT in the last 72 hours. Sepsis Labs: Recent Labs  Lab 02/05/19 1041  PROCALCITON <0.10    Recent Results (from the past 240 hour(s))  SARS Coronavirus 2 (CEPHEID - Performed in [place] hospital lab), Hosp Order     Status: None   Collection Time: 02/05/19  1:37 AM   Specimen: Nasopharyngeal Swab  Result Value Ref Range Status   SARS Coronavirus 2 NEGATIVE NEGATIVE Final    Comment: (NOTE) If result is NEGATIVE SARS-CoV-2 target nucleic acids are NOT DETECTED. The SARS-CoV-2 RNA is generally detectable in upper and lower  respiratory specimens during the acute phase of infection. The lowest  concentration of SARS-CoV-2 viral copies this assay can detect is 250  copies / mL. A negative result does not preclude SARS-CoV-2 infection  and should not be used as the sole basis for treatment or other  patient management decisions.  A negative result may occur with  improper specimen collection / handling, submission of specimen other  than nasopharyngeal swab, presence of viral mutation(s) within the  areas targeted by this assay, and inadequate number of viral copies  (<250 copies / mL). A negative result must be combined with clinical  observations, patient history, and epidemiological information. If result is POSITIVE SARS-CoV-2 target nucleic acids are DETECTED. The SARS-CoV-2 RNA is generally detectable in upper and lower  respiratory specimens dur ing the acute phase of infection.  Positive  results are indicative of active infection with SARS-CoV-2.  Clinical  correlation with patient history and other diagnostic information is  necessary to determine patient infection status.  Positive results do  not rule out bacterial infection or co-infection with other viruses. If result is PRESUMPTIVE POSTIVE SARS-CoV-2 nucleic acids MAY BE PRESENT.   A presumptive positive result was obtained on the submitted  specimen  and confirmed on repeat testing.  While 2019 novel coronavirus  (SARS-CoV-2) nucleic acids may be present in the submitted sample  additional confirmatory testing may be necessary for epidemiological  and / or clinical management purposes  to differentiate between  SARS-CoV-2 and other Sarbecovirus currently known to infect humans.  If clinically indicated additional testing with an alternate test  methodology [id number]) is advised. The SARS-CoV-2 RNA is generally  detectable in upper and lower respiratory sp ecimens during the acute  phase of infection. The expected result is Negative. Fact Sheet for Patients:  StrictlyIdeas.no Fact Sheet for Healthcare Providers: BankingDealers.co.za This test is not yet approved or cleared by the [place] FDA and has been authorized for detection and/or diagnosis of SARS-CoV-2 by FDA under an Emergency Use Authorization (EUA).  This EUA will remain in effect (meaning this test can be used) for the duration of the COVID-19 declaration under Section 564(b)(1) of the Act, 21 U.S.C. section 360bbb-3(b)(1), unless the authorization is terminated or revoked sooner. Performed at [facility], [place] [address], [place], [place] [address]   MRSA PCR Screening     Status: None   Collection Time: 02/06/19  1:12 AM   Specimen: Nasal Mucosa; Nasopharyngeal  Result Value Ref Range Status   MRSA by PCR NEGATIVE NEGATIVE Final    Comment:        The GeneXpert MRSA Assay (FDA approved for NASAL specimens only), is one component of a comprehensive MRSA colonization surveillance program. It is not intended to diagnose MRSA infection nor to guide or monitor treatment for MRSA infections. Performed at [facility], [place] [address] [place], [place] [address]          Radiology Studies: Dg Chest 2 View  Result Date: 02/05/2019 CLINICAL DATA:  Chest and upper  back pain EXAM: CHEST - 2 VIEW COMPARISON:  01/16/2019 FINDINGS: There is mild cardiomegaly. There are bilateral basilar predominant interstitial opacities. No pneumothorax or sizable pleural effusion. IMPRESSION: Mild cardiomegaly with bibasilar opacities, likely atelectasis or mild interstitial pulmonary edema. Electronically Signed   By: Ulyses Jarred M.D.   On: 02/05/2019 01:06        Scheduled Meds: . amLODipine  10 mg Oral Daily  . aspirin EC  81 mg Oral BID  . budesonide (PULMICORT) nebulizer solution  0.5 mg Nebulization BID  . busPIRone  5 mg Oral TID  . carvedilol  25 mg Oral BID WC  . enoxaparin (LOVENOX) injection  30 mg Subcutaneous Daily  . ferrous sulfate  325 mg Oral TID  . FLUoxetine  20 mg Oral Daily  . furosemide  80 mg Oral BID  . gabapentin  300 mg Oral BID  . gabapentin  400 mg Oral BID  . hydrALAZINE  100 mg Oral TID  . insulin regular  0-10 Units Intravenous TID WC  . ipratropium-albuterol  3  mL Nebulization BID  . loratadine  10 mg Oral Daily  . methylPREDNISolone (SOLU-MEDROL) injection  40 mg Intravenous Q8H  . montelukast  10 mg Oral QHS  . rosuvastatin  10 mg Oral QHS  . senna  2 tablet Oral BID  . sodium chloride flush  3 mL Intravenous Q12H   Continuous Infusions: . sodium chloride    . doxycycline (VIBRAMYCIN) IV 100 mg (02/06/19 1025)  . insulin 4.1 Units/hr (02/06/19 0905)     LOS: 0 days   Time spent= 35 mins    Kinzlee Selvy Arsenio Loader, MD [facility]  If 7PM-7AM, please contact night-coverage [email or web address] 02/06/2019, 10:35 AM

## 2019-02-06 NOTE — Progress Notes (Signed)
Pharmacy IV to PO conversion  This patient is receiving doxycycline by the intravenous route. Based on criteria approved by the Pharmacy and Therapeutics Committee, and the Infectious Disease Division, the antibiotic(s) is/are being converted to equivalent oral dose form(s). These criteria include:   Patient being treated for a respiratory tract infection, urinary tract infection, cellulitis, or Clostridium Difficile Associated Diarrhea  The patient is not neutropenic and does not exhibit a GI malabsorption state  The patient is eating (either orally or per tube) and/or has been taking other orally administered medications for at least 24 hours.  The patient is improving clinically (physician assessment and a 24-hour Tmax of <=100.5 F)  If you have any questions about this conversion, please contact the Pharmacy Department (ext [phone]).  Thank you.  Reuel Boom, PharmD, BCPS [phone] 02/06/2019, 2:04 PM

## 2019-02-06 NOTE — Progress Notes (Signed)
Report called to [facility] RN stepdown/ICU

## 2019-02-06 NOTE — Progress Notes (Signed)
Inpatient Diabetes Program Recommendations  AACE/ADA: New Consensus Statement on Inpatient Glycemic Control (2015)  Target Ranges:  Prepandial:   less than 140 mg/dL      Peak postprandial:   less than 180 mg/dL (1-2 hours)      Critically ill patients:  140 - 180 mg/dL   Lab Results  Component Value Date   GLUCAP 221 (H) 02/06/2019   HGBA1C 9.3 (H) 01/16/2019    Review of Glycemic Control  Diabetes history: DM2 Outpatient Diabetes medications: Lantus 30-30-20 units tidwc, Humalog 4 units tidwc Current orders for Inpatient glycemic control: IV insulin per DKA order set  HgbA1C - 9.3% - needs tighter control. Hx CKD IV AG - 17   CO2 - 16. Not ready to transition. RN covering CHOs with insulin drip boluses Has not been to [facility] in several months.  Inpatient Diabetes Program Recommendations:     When criteria met to d/c drip, give Lantus 1-2 hours prior to discontinuation of drip. Give correction insulin at time of d/cing drip.  Lantus 20 units bid Novolog 0-9 units tidwc and hs Add meal coverage - Novolog 4 units tidwc if pt eats > 50% meal.  Spoke with pt regarding HgbA1C of 9.3%. Has not been to [facility] in almost a year. Needs to set up appt for diabetes care visit. Pt was very upset when I visited her this morning about being unable to make it to the bathroom in time. Listened to patient and helped her order diet. Stressed importance of improving her glycemic control to reduce risks of long-term complications. Pt has CKD IV. Suggested eating healthier diet with portion control and trying to get exercise every day. Increase monitoring and f/u with [facility]. Pt appreciative of information.  Thank you. Lorenda Peck, RD, LDN, CDE Inpatient Diabetes Coordinator [phone]

## 2019-02-07 LAB — BASIC METABOLIC PANEL
Anion gap: 13 (ref 5–15)
Anion gap: 13 (ref 5–15)
BUN: 93 mg/dL — ABNORMAL HIGH (ref 8–23)
BUN: 93 mg/dL — ABNORMAL HIGH (ref 8–23)
CO2: 20 mmol/L — ABNORMAL LOW (ref 22–32)
CO2: 18 mmol/L — ABNORMAL LOW (ref 22–32)
Calcium: 9.5 mg/dL (ref 8.9–10.3)
Calcium: 9.3 mg/dL (ref 8.9–10.3)
Chloride: 105 mmol/L (ref 98–111)
Chloride: 107 mmol/L (ref 98–111)
Creatinine, Ser: 3.08 mg/dL — ABNORMAL HIGH (ref 0.44–1.00)
Creatinine, Ser: 3.04 mg/dL — ABNORMAL HIGH (ref 0.44–1.00)
GFR calc Af Amer: 18 mL/min — ABNORMAL LOW (ref >60)
GFR calc Af Amer: 18 mL/min — ABNORMAL LOW (ref >60)
GFR calc non Af Amer: 15 mL/min — ABNORMAL LOW (ref >60)
GFR calc non Af Amer: 16 mL/min — ABNORMAL LOW (ref >60)
Glucose, Bld: 110 mg/dL — ABNORMAL HIGH (ref 70–99)
Glucose, Bld: 140 mg/dL — ABNORMAL HIGH (ref 70–99)
Potassium: 4.5 mmol/L (ref 3.5–5.1)
Potassium: 4.1 mmol/L (ref 3.5–5.1)
Sodium: 138 mmol/L (ref 135–145)
Sodium: 138 mmol/L (ref 135–145)

## 2019-02-07 LAB — CBC
HCT: 36.5 % (ref 36.0–46.0)
Hemoglobin: 11.1 g/dL — ABNORMAL LOW (ref 12.0–15.0)
MCH: 26.2 pg (ref 26.0–34.0)
MCHC: 30.4 g/dL (ref 30.0–36.0)
MCV: 86.1 fL (ref 80.0–100.0)
Platelets: 268 10*3/uL (ref 150–400)
RBC: 4.24 MIL/uL (ref 3.87–5.11)
RDW: 17.2 % — ABNORMAL HIGH (ref 11.5–15.5)
WBC: 22.3 10*3/uL — ABNORMAL HIGH (ref 4.0–10.5)
nRBC: 0 % (ref 0.0–0.2)

## 2019-02-07 LAB — GLUCOSE, CAPILLARY
Glucose-Capillary: 117 mg/dL — ABNORMAL HIGH (ref 70–99)
Glucose-Capillary: 123 mg/dL — ABNORMAL HIGH (ref 70–99)
Glucose-Capillary: 127 mg/dL — ABNORMAL HIGH (ref 70–99)
Glucose-Capillary: 140 mg/dL — ABNORMAL HIGH (ref 70–99)
Glucose-Capillary: 148 mg/dL — ABNORMAL HIGH (ref 70–99)

## 2019-02-07 LAB — MAGNESIUM: Magnesium: 2.4 mg/dL (ref 1.7–2.4)

## 2019-02-07 MED ORDER — DOXYCYCLINE HYCLATE 100 MG PO TABS: 100.0000 mg | ORAL_TABLET | Freq: Two times a day (BID) | ORAL | 0 refills | Status: AC

## 2019-02-07 MED ORDER — INSULIN GLARGINE 100 UNIT/ML ~~LOC~~ SOLN
10.0000 [IU] | Freq: Every day | SUBCUTANEOUS | Status: DC
  Administered 2019-02-07: 10 [IU] via SUBCUTANEOUS
  Filled 2019-02-07: qty 0.1

## 2019-02-07 MED ORDER — GABAPENTIN 400 MG PO CAPS: 400.0000 mg | ORAL_CAPSULE | Freq: Two times a day (BID) | ORAL | 1 refills | Status: AC

## 2019-02-07 MED ORDER — INSULIN ASPART 100 UNIT/ML ~~LOC~~ SOLN
0.0000 [IU] | Freq: Three times a day (TID) | SUBCUTANEOUS | Status: DC
  Administered 2019-02-07: 08:00:00 2 [IU] via SUBCUTANEOUS

## 2019-02-07 MED ORDER — PREDNISONE 20 MG PO TABS
40.0000 mg | ORAL_TABLET | Freq: Every day | ORAL | Status: DC
  Administered 2019-02-07: 40 mg via ORAL
  Filled 2019-02-07: qty 2

## 2019-02-07 MED ORDER — INSULIN GLARGINE 100 UNIT/ML ~~LOC~~ SOLN
20.0000 [IU] | Freq: Two times a day (BID) | SUBCUTANEOUS | Status: DC
  Administered 2019-02-07: 09:00:00 20 [IU] via SUBCUTANEOUS
  Filled 2019-02-07 (×2): qty 0.2

## 2019-02-07 MED ORDER — PREDNISONE 20 MG PO TABS: 40.0000 mg | ORAL_TABLET | Freq: Every day | ORAL | 0 refills | Status: AC

## 2019-02-07 MED ORDER — INSULIN ASPART 100 UNIT/ML ~~LOC~~ SOLN
4.0000 [IU] | Freq: Three times a day (TID) | SUBCUTANEOUS | Status: DC
  Administered 2019-02-07: 08:00:00 4 [IU] via SUBCUTANEOUS

## 2019-02-07 MED ORDER — INSULIN ASPART 100 UNIT/ML ~~LOC~~ SOLN
0.0000 [IU] | SUBCUTANEOUS | Status: DC
  Administered 2019-02-07: 04:00:00 2 [IU] via SUBCUTANEOUS

## 2019-02-07 NOTE — Progress Notes (Signed)
Discharge papers given to patient by this RN, IV out, patient waiting on a ride home.

## 2019-02-07 NOTE — Discharge Summary (Signed)
Physician Discharge Summary  Brenda Sims YIR:[id number] DOB: 1955-10-06 DOA: 02/04/2019  PCP: Leonard Downing, MD  Admit date: 02/04/2019 Discharge date: 02/07/2019  Admitted From: Home Disposition: Home  Recommendations for Outpatient Follow-up:  1. Follow up with PCP in 1-2 weeks 2. Please obtain BMP/CBC in one week your next doctors visit.  3. Take 2 more days of oral prednisone and doxycycline 4. Gabapentin reduced to 400 mg twice daily, renally dosed. 5. Advised to get outpatient endocrinology given uncontrolled diabetes.   Discharge Condition: Stable CODE STATUS: Full code Diet recommendation: Diabetic  Brief/Interim Summary: 63 year old with history of essential hypertension, diabetes mellitus type 2-insulin-dependent, CKD stage IV, asthma came to the hospital with complains of atypical chest pain and shortness of breath.  She was diagnosed with asthma exacerbation with atypical chest pain.  Cardiac causes were ruled out.  She was started on doxycycline, bronchodilators and IV steroids.  Hospital course complicated by uncontrolled blood glucose.  She had elevated anion gap therefore we will keep her on DKA protocol which helped her symptoms.  Over the course of couple of days her breathing significantly improved, her anion gap closed.  She was seen by diabetic coordinator.  Her sugar started improving, initially thought to be secondary to Solu-Medrol. Today she is stable for discharge with outpatient follow-up recommendations as stated above.    Discharge Diagnoses:  Principal Problem:   Chest pain Active Problems:   Essential hypertension   Type 1 diabetes mellitus without complication (HCC)   CKD (chronic kidney disease) stage 4, GFR 15-29 ml/min (HCC)   Type II diabetes mellitus with renal manifestations (HCC)   Sleep apnea   Asthma exacerbation  Acute exacerbation of mild persistent asthma - This is resolved.  Advised patient to resume her home bronchodilators.   Will give 2 more days of oral doxycycline and oral prednisone.  Diabetes mellitus type 2, uncontrolled.  Hyperglycemia without anion gap Peripheral neuropathy -  DKA has resolved, insulin drip has been stopped.  Resuming her home regimen of Lantus 20 units twice daily with sliding scale.  Her sugars remain fairly well controlled.  Appreciate input from diabetic coordinator. -Her gabapentin dose reduced to 40 mg twice daily, renally dosed.  CKD stage IV -Secondary to uncontrolled diabetes and hypertension.  Close to her baseline of 3.0.  Closely monitor renal function.  Avoid nephrotoxic drugs.  Would recommend outpatient follow-up with nephrology  history of coronary artery disease Chronic systolic congestive heart failure -Currently appears to be compensated without any chest pain.  Will resume her home medications.  Anemia of chronic disease and iron deficiency -Continue ferrous sulfate  History of depression and anxiety -Resume home meds.  Overnight on the day prior to her discharge she mentioned to the nurse that she may feel unsafe at home due to her significant other.  This morning she is denying any sort of threats at home and feels safe to go home.  Consultations:  Diabetic coordinator  Subjective: She denies any complaints, wishes to go home.  Apparently the overnight nurse she had mentioned that she feels unsafe to go home due to her significant other but this morning she tells me she is safe and wants to go home.  She denies any shortness of breath and feels well  Discharge Exam: Vitals:   02/07/19 0846 02/07/19 0900  BP: (!) 120/49   Pulse: 76 83  Resp: 18 (!) 21  Temp:    SpO2: 97% 90%   Vitals:   02/07/19  0800 02/07/19 0816 02/07/19 0846 02/07/19 0900  BP:   (!) 120/49   Pulse: 81  76 83  Resp: 16  18 (!) 21  Temp:      TempSrc:      SpO2: 95% 97% 97% 90%  Weight:      Height:        General: Pt is alert, awake, not in acute  distress Cardiovascular: RRR, S1/S2 +, no rubs, no gallops Respiratory: CTA bilaterally, no wheezing, no rhonchi Abdominal: Soft, NT, ND, bowel sounds + Extremities: no edema, no cyanosis  Discharge Instructions  Discharge Instructions    Call MD for:  difficulty breathing, headache or visual disturbances   Complete by: As directed    Call MD for:  persistant nausea and vomiting   Complete by: As directed    Diet - low sodium heart healthy   Complete by: As directed    Increase activity slowly   Complete by: As directed      Allergies as of 02/07/2019      Reactions   Eggs Or Egg-derived Products Shortness Of Breath, Rash   Azithromycin Swelling   Facial/throat swelling   Cherry Swelling   Facial swelling   Latex Rash      Medication List    STOP taking these medications   gabapentin 800 MG tablet Commonly known as: NEURONTIN Replaced by: gabapentin 400 MG capsule     TAKE these medications   acetaminophen-codeine 300-30 MG tablet Commonly known as: TYLENOL #3 Take 1 tablet by mouth every 8 (eight) hours as needed for pain.   albuterol 108 (90 Base) MCG/ACT inhaler Commonly known as: VENTOLIN HFA Inhale 1-2 puffs into the lungs every 6 (six) hours as needed for shortness of breath.   amLODipine 10 MG tablet Commonly known as: NORVASC Take 1 tablet (10 mg total) by mouth daily. MUST MAKE APPT FOR FURTHER REFILLS What changed: additional instructions   aspirin EC 81 MG tablet Take 1 tablet (81 mg total) by mouth daily. What changed: when to take this   busPIRone 5 MG tablet Commonly known as: BUSPAR Take 1 tablet (5 mg total) by mouth 3 (three) times daily.   carvedilol 25 MG tablet Commonly known as: COREG TAKE 1 TABLET BY MOUTH 2 (TWO) TIMES DAILY WITH A MEAL. What changed: See the new instructions.   cetirizine 10 MG tablet Commonly known as: ZYRTEC TAKE 1 TABLET (10 MG TOTAL) BY MOUTH DAILY.   cyclobenzaprine 10 MG tablet Commonly known as:  FLEXERIL Take 1 tablet (10 mg total) by mouth 3 (three) times daily as needed for muscle spasms.   diphenhydrAMINE 25 MG tablet Commonly known as: BENADRYL Take 25 mg by mouth every 6 (six) hours as needed for itching or allergies.   doxycycline 100 MG tablet Commonly known as: VIBRA-TABS Take 1 tablet (100 mg total) by mouth 2 (two) times daily for 2 days.   ferrous sulfate 325 (65 FE) MG tablet Take 325 mg by mouth 3 (three) times daily.   FLUoxetine 20 MG capsule Commonly known as: PROZAC Take 20 mg by mouth daily.   fluticasone 50 MCG/ACT nasal spray Commonly known as: FLONASE Place 2 sprays into both nostrils daily as needed for allergies or rhinitis.   furosemide 40 MG tablet Commonly known as: LASIX Take 2 tablets (80 mg total) by mouth every morning AND 1 tablet (40 mg total) every evening.   gabapentin 400 MG capsule Commonly known as: NEURONTIN Take 1 capsule (400 mg  total) by mouth 2 (two) times daily. Replaces: gabapentin 800 MG tablet   glucose blood test strip Commonly known as: True Metrix Blood Glucose Test Use as instructed   glucose blood test strip Commonly known as: Accu-Chek Aviva Use as instructed   HumaLOG KwikPen 100 UNIT/ML KwikPen Generic drug: insulin lispro Inject 4 Units into the skin 3 (three) times daily.   hydrALAZINE 100 MG tablet Commonly known as: APRESOLINE Take 1 tablet (100 mg total) by mouth 3 (three) times daily.   Hyoscyamine Sulfate SL 0.125 MG Subl Commonly known as: Levsin/SL Place 1 tablet under the tongue every 4 (four) hours as needed. What changed: reasons to take this   Insulin Glargine 100 UNIT/ML Solostar Pen Commonly known as: Lantus SoloStar Inject 20 Units into the skin 2 (two) times daily. What changed:   how much to take  when to take this  additional instructions   Insulin Pen Needle 31G X 6 MM Misc Commonly known as: TRUEplus Pen Needles Use as directed   losartan 100 MG tablet Commonly known  as: COZAAR Take 100 mg by mouth daily.   montelukast 10 MG tablet Commonly known as: SINGULAIR Take 1 tablet (10 mg total) by mouth at bedtime.   multivitamin tablet Take 1 tablet by mouth daily.   nitroGLYCERIN 0.4 MG SL tablet Commonly known as: NITROSTAT Place 0.4 mg under the tongue every 5 (five) minutes as needed for chest pain.   potassium chloride 10 MEQ tablet Commonly known as: K-DUR Take 1 tablet (10 mEq total) by mouth daily.   predniSONE 20 MG tablet Commonly known as: DELTASONE Take 2 tablets (40 mg total) by mouth daily with breakfast for 2 days. Start taking on: February 08, 2019   rosuvastatin 40 MG tablet Commonly known as: CRESTOR Take 1 tablet (40 mg total) by mouth daily. What changed: when to take this   senna 8.6 MG Tabs tablet Commonly known as: SENOKOT Take 2 tablets by mouth 2 (two) times a day.   spironolactone 50 MG tablet Commonly known as: ALDACTONE Take 1 tablet (50 mg total) by mouth daily.   True Metrix Meter Devi 1 kit by Does not apply route 4 (four) times daily.   TRUEplus Lancets 28G Misc 28 g by Does not apply route 4 (four) times daily.   Accu-Chek Softclix Lancets lancets Use as instructed      Follow-up Information    Leonard Downing, MD. Schedule an appointment as soon as possible for a visit in 1 week(s).   Specialty: Family Medicine Contact information: [place] [address] [phone]        Sanda Klein, MD .   Specialty: Cardiology Contact information: [address] [place] [address] [place] [address] [phone]          Allergies  Allergen Reactions  . Eggs Or Egg-Derived Products Shortness Of Breath and Rash  . Azithromycin Swelling    Facial/throat swelling  . Cherry Swelling    Facial swelling  . Latex Rash    You were cared for by a hospitalist during your hospital stay. If you have any questions about your discharge medications or the care you received  while you were in the hospital after you are discharged, you can call the unit and asked to speak with the hospitalist on call if the hospitalist that took care of you is not available. Once you are discharged, your primary care physician will handle any further medical issues. Please note that  no refills for any discharge medications will be authorized once you are discharged, as it is imperative that you return to your primary care physician (or establish a relationship with a primary care physician if you do not have one) for your aftercare needs so that they can reassess your need for medications and monitor your lab values.   Procedures/Studies: Dg Chest 2 View  Result Date: 02/05/2019 CLINICAL DATA:  Chest and upper back pain EXAM: CHEST - 2 VIEW COMPARISON:  01/16/2019 FINDINGS: There is mild cardiomegaly. There are bilateral basilar predominant interstitial opacities. No pneumothorax or sizable pleural effusion. IMPRESSION: Mild cardiomegaly with bibasilar opacities, likely atelectasis or mild interstitial pulmonary edema. Electronically Signed   By: Ulyses Jarred M.D.   On: 02/05/2019 01:06   Dg Chest 2 View  Result Date: 01/16/2019 CLINICAL DATA:  Chest pain. EXAM: CHEST - 2 VIEW COMPARISON:  10/09/2018. FINDINGS: Poor inspiration. Normal sized heart. Increased diffuse prominence of the interstitial markings with mild patchy density in both lung bases, accentuated by the poor inspiration. No visible pleural fluid. Mild thoracic spine degenerative changes. IMPRESSION: 1. Poor inspiration with bibasilar airspace opacity suspicious for pneumonia or alveolar edema. 2. Mildly progressive interstitial lung disease, possibly representing interstitial pulmonary edema or pneumonitis superimposed on chronic interstitial lung disease. Electronically Signed   By: Claudie Revering M.D.   On: 01/16/2019 12:50      The results of significant diagnostics from this hospitalization (including imaging,  microbiology, ancillary and laboratory) are listed below for reference.     Microbiology: Recent Results (from the past 240 hour(s))  SARS Coronavirus 2 (CEPHEID - Performed in [place] hospital lab), Hosp Order     Status: None   Collection Time: 02/05/19  1:37 AM   Specimen: Nasopharyngeal Swab  Result Value Ref Range Status   SARS Coronavirus 2 NEGATIVE NEGATIVE Final    Comment: (NOTE) If result is NEGATIVE SARS-CoV-2 target nucleic acids are NOT DETECTED. The SARS-CoV-2 RNA is generally detectable in upper and lower  respiratory specimens during the acute phase of infection. The lowest  concentration of SARS-CoV-2 viral copies this assay can detect is 250  copies / mL. A negative result does not preclude SARS-CoV-2 infection  and should not be used as the sole basis for treatment or other  patient management decisions.  A negative result may occur with  improper specimen collection / handling, submission of specimen other  than nasopharyngeal swab, presence of viral mutation(s) within the  areas targeted by this assay, and inadequate number of viral copies  (<250 copies / mL). A negative result must be combined with clinical  observations, patient history, and epidemiological information. If result is POSITIVE SARS-CoV-2 target nucleic acids are DETECTED. The SARS-CoV-2 RNA is generally detectable in upper and lower  respiratory specimens dur ing the acute phase of infection.  Positive  results are indicative of active infection with SARS-CoV-2.  Clinical  correlation with patient history and other diagnostic information is  necessary to determine patient infection status.  Positive results do  not rule out bacterial infection or co-infection with other viruses. If result is PRESUMPTIVE POSTIVE SARS-CoV-2 nucleic acids MAY BE PRESENT.   A presumptive positive result was obtained on the submitted specimen  and confirmed on repeat testing.  While 2019 novel coronavirus   (SARS-CoV-2) nucleic acids may be present in the submitted sample  additional confirmatory testing may be necessary for epidemiological  and / or clinical management purposes  to differentiate between  SARS-CoV-2  and other Sarbecovirus currently known to infect humans.  If clinically indicated additional testing with an alternate test  methodology [id number]) is advised. The SARS-CoV-2 RNA is generally  detectable in upper and lower respiratory sp ecimens during the acute  phase of infection. The expected result is Negative. Fact Sheet for Patients:  StrictlyIdeas.no Fact Sheet for Healthcare Providers: BankingDealers.co.za This test is not yet approved or cleared by the [place] FDA and has been authorized for detection and/or diagnosis of SARS-CoV-2 by FDA under an Emergency Use Authorization (EUA).  This EUA will remain in effect (meaning this test can be used) for the duration of the COVID-19 declaration under Section 564(b)(1) of the Act, 21 U.S.C. section 360bbb-3(b)(1), unless the authorization is terminated or revoked sooner. Performed at [facility], [place] [address], [place], [place] [address]   MRSA PCR Screening     Status: None   Collection Time: 02/06/19  1:12 AM   Specimen: Nasal Mucosa; Nasopharyngeal  Result Value Ref Range Status   MRSA by PCR NEGATIVE NEGATIVE Final    Comment:        The GeneXpert MRSA Assay (FDA approved for NASAL specimens only), is one component of a comprehensive MRSA colonization surveillance program. It is not intended to diagnose MRSA infection nor to guide or monitor treatment for MRSA infections. Performed at [facility], [place] [address], [place], [place] [address]   Culture, blood (routine x 2)     Status: None (Preliminary result)   Collection Time: 02/06/19  5:40 AM   Specimen: BLOOD  Result Value Ref Range Status   Specimen Description    Final    BLOOD LEFT ANTECUBITAL Performed at [facility] [address] [place], [place] [address]    Special Requests   Final    BOTTLES DRAWN AEROBIC AND ANAEROBIC Blood Culture adequate volume Performed at [facility] [address], [place], [place] [address]    Culture   Final    NO GROWTH 1 DAY Performed at [facility], [place] [address], [place], [place] [address]    Report Status PENDING  Incomplete  Culture, blood (routine x 2)     Status: None (Preliminary result)   Collection Time: 02/06/19  5:40 AM   Specimen: BLOOD  Result Value Ref Range Status   Specimen Description   Final    BLOOD LEFT ANTECUBITAL Performed at [facility] [address], [place], [place] [address]    Special Requests   Final    BOTTLES DRAWN AEROBIC AND ANAEROBIC Blood Culture adequate volume Performed at [facility] [address], [place], [place] [address]    Culture   Final    NO GROWTH 1 DAY Performed at [facility], [place] [address] [place], [place] [address]    Report Status PENDING  Incomplete     Labs: BNP (last 3 results) Recent Labs    10/10/18 0908 01/16/19 1240 02/05/19 0058  BNP 50.8 45.5 75.7   Basic Metabolic Panel: Recent Labs  Lab 02/06/19 0248 02/06/19 0540 02/06/19 1051 02/06/19 1638 02/06/19 2311 02/07/19 0620  NA 136 138 138 135 138 138  K 4.6 4.7 4.5 4.2 4.5 4.1  CL 104 104 105 103 105 107  CO2 19* 18* 16* 17* 20* 18*  GLUCOSE 312* 202* 160* 236* 110* 140*  BUN 92* 90* 94* 93* 93* 93*  CREATININE 3.48* 3.31* 3.19* 3.13* 3.08* 3.04*  CALCIUM 9.6 9.6 9.3 9.4 9.5 9.3  MG 1.9  --   --   --   --  2.4  PHOS 3.1  --   --   --   --   --    Liver Function Tests: No results for input(s): AST, ALT, ALKPHOS, BILITOT, PROT, ALBUMIN in the last 168 hours. No results for input(s): LIPASE, AMYLASE in the last 168 hours. No results for input(s): AMMONIA in the last 168  hours. CBC: Recent Labs  Lab 02/04/19 2359 02/06/19 0248 02/07/19 0620  WBC 15.9* 26.7* 22.3*  HGB 10.8* 11.3* 11.1*  HCT 35.3* 35.3* 36.5  MCV 85.7 82.7 86.1  PLT 260 252 268   Cardiac Enzymes: No results for input(s): CKTOTAL, CKMB, CKMBINDEX, TROPONINI in the last 168 hours. BNP: Invalid input(s): POCBNP CBG: Recent Labs  Lab 02/06/19 2359 02/07/19 0109 02/07/19 0212 02/07/19 0331 02/07/19 0742  GLUCAP 117* 127* 123* 148* 140*   D-Dimer Recent Labs    02/05/19 0011  DDIMER 0.52*   Hgb A1c No results for input(s): HGBA1C in the last 72 hours. Lipid Profile No results for input(s): CHOL, HDL, LDLCALC, TRIG, CHOLHDL, LDLDIRECT in the last 72 hours. Thyroid function studies No results for input(s): TSH, T4TOTAL, T3FREE, THYROIDAB in the last 72 hours.  Invalid input(s): FREET3 Anemia work up No results for input(s): VITAMINB12, FOLATE, FERRITIN, TIBC, IRON, RETICCTPCT in the last 72 hours. Urinalysis    Component Value Date/Time   COLORURINE YELLOW 02/06/2019 0354   APPEARANCEUR CLEAR 02/06/2019 0354   LABSPEC 1.009 02/06/2019 0354   PHURINE 5.0 02/06/2019 0354   GLUCOSEU NEGATIVE 02/06/2019 0354   HGBUR NEGATIVE 02/06/2019 0354   BILIRUBINUR NEGATIVE 02/06/2019 0354   KETONESUR NEGATIVE 02/06/2019 0354   PROTEINUR NEGATIVE 02/06/2019 0354   NITRITE NEGATIVE 02/06/2019 0354   LEUKOCYTESUR NEGATIVE 02/06/2019 0354   Sepsis Labs Invalid input(s): PROCALCITONIN,  WBC,  LACTICIDVEN Microbiology Recent Results (from the past 240 hour(s))  SARS Coronavirus 2 (CEPHEID - Performed in [place] hospital lab), Hosp Order     Status: None   Collection Time: 02/05/19  1:37 AM   Specimen: Nasopharyngeal Swab  Result Value Ref Range Status   SARS Coronavirus 2 NEGATIVE NEGATIVE Final    Comment: (NOTE) If result is NEGATIVE SARS-CoV-2 target nucleic acids are NOT DETECTED. The SARS-CoV-2 RNA is generally detectable in upper and lower  respiratory specimens  during the acute phase of infection. The lowest  concentration of SARS-CoV-2 viral copies this assay can detect is 250  copies / mL. A negative result does not preclude SARS-CoV-2 infection  and should not be used as the sole basis for treatment or other  patient management decisions.  A negative result may occur with  improper specimen collection / handling, submission of specimen other  than nasopharyngeal swab, presence of viral mutation(s) within the  areas targeted by this assay, and inadequate number of viral copies  (<250 copies / mL). A negative result must be combined with clinical  observations, patient history, and epidemiological information. If result is POSITIVE SARS-CoV-2 target nucleic acids are DETECTED. The SARS-CoV-2 RNA is generally detectable in upper and lower  respiratory specimens dur ing the acute phase of infection.  Positive  results are indicative of active infection with SARS-CoV-2.  Clinical  correlation with patient history and other diagnostic information is  necessary to determine patient infection status.  Positive results do  not rule out bacterial infection or co-infection with other viruses. If result is  PRESUMPTIVE POSTIVE SARS-CoV-2 nucleic acids MAY BE PRESENT.   A presumptive positive result was obtained on the submitted specimen  and confirmed on repeat testing.  While 2019 novel coronavirus  (SARS-CoV-2) nucleic acids may be present in the submitted sample  additional confirmatory testing may be necessary for epidemiological  and / or clinical management purposes  to differentiate between  SARS-CoV-2 and other Sarbecovirus currently known to infect humans.  If clinically indicated additional testing with an alternate test  methodology [phone]) is advised. The SARS-CoV-2 RNA is generally  detectable in upper and lower respiratory sp ecimens during the acute  phase of infection. The expected result is Negative. Fact Sheet for Patients:   StrictlyIdeas.no Fact Sheet for Healthcare Providers: BankingDealers.co.za This test is not yet approved or cleared by the [place] FDA and has been authorized for detection and/or diagnosis of SARS-CoV-2 by FDA under an Emergency Use Authorization (EUA).  This EUA will remain in effect (meaning this test can be used) for the duration of the COVID-19 declaration under Section 564(b)(1) of the Act, 21 U.S.C. section 360bbb-3(b)(1), unless the authorization is terminated or revoked sooner. Performed at [facility], [place] [address], [place], [place] [address]   MRSA PCR Screening     Status: None   Collection Time: 02/06/19  1:12 AM   Specimen: Nasal Mucosa; Nasopharyngeal  Result Value Ref Range Status   MRSA by PCR NEGATIVE NEGATIVE Final    Comment:        The GeneXpert MRSA Assay (FDA approved for NASAL specimens only), is one component of a comprehensive MRSA colonization surveillance program. It is not intended to diagnose MRSA infection nor to guide or monitor treatment for MRSA infections. Performed at [facility], [place] [address] [place], [place] [address]   Culture, blood (routine x 2)     Status: None (Preliminary result)   Collection Time: 02/06/19  5:40 AM   Specimen: BLOOD  Result Value Ref Range Status   Specimen Description   Final    BLOOD LEFT ANTECUBITAL Performed at [facility] [address] [place], [place] [address]    Special Requests   Final    BOTTLES DRAWN AEROBIC AND ANAEROBIC Blood Culture adequate volume Performed at [facility] [address] [place], [place] [address]    Culture   Final    NO GROWTH 1 DAY Performed at [facility], [place] [address], [place], [place] [address]    Report Status PENDING  Incomplete  Culture, blood (routine x 2)     Status: None (Preliminary result)   Collection Time:  02/06/19  5:40 AM   Specimen: BLOOD  Result Value Ref Range Status   Specimen Description   Final    BLOOD LEFT ANTECUBITAL Performed at [facility] [address], [place], [place] [address]    Special Requests   Final    BOTTLES DRAWN AEROBIC AND ANAEROBIC Blood Culture adequate volume Performed at [facility] [address] [place], [place] [address]    Culture   Final    NO GROWTH 1 DAY Performed at [facility], [place] [address], [place],  [address]    Report Status PENDING  Incomplete     Time coordinating discharge:  I have spent 35 minutes face to face with the patient and on the ward discussing the patients care, assessment, plan and disposition with other care givers. >50% of the  time was devoted counseling the patient about the risks and benefits of treatment/Discharge disposition and coordinating care.   SIGNED:   Damita Lack, MD  [facility] 02/07/2019, 10:40 AM   If 7PM-7AM, please contact night-coverage [email or web address]

## 2019-02-11 ENCOUNTER — Other Ambulatory Visit: Payer: Self-pay | Admitting: Cardiovascular Disease

## 2019-02-11 LAB — CULTURE, BLOOD (ROUTINE X 2)
Culture: NO GROWTH
Culture: NO GROWTH
Special Requests: ADEQUATE
Special Requests: ADEQUATE

## 2019-02-11 NOTE — Telephone Encounter (Signed)
New Message     *STAT* If patient is at the pharmacy, call can be transferred to refill team.   1. Which medications need to be refilled? (please list name of each medication and dose if known) Nitroglycerin 0.4mg    2. Which pharmacy/location (including street and city if local pharmacy) is medication to be sent to? [facility]  3. Do they need a 30 day or 90 day supply? 90 day supply

## 2019-02-12 MED ORDER — NITROGLYCERIN 0.4 MG SL SUBL: 0.4000 mg | SUBLINGUAL_TABLET | SUBLINGUAL | 1 refills | Status: AC | PRN

## 2019-02-12 NOTE — Telephone Encounter (Signed)
Rx(s) sent to pharmacy electronically.  

## 2019-02-17 ENCOUNTER — Ambulatory Visit: Payer: Medicaid Other | Admitting: Cardiovascular Disease

## 2019-02-19 ENCOUNTER — Telehealth: Payer: Self-pay

## 2019-02-19 NOTE — Telephone Encounter (Signed)
Lm on vm - home and mobile

## 2019-02-19 NOTE — Telephone Encounter (Signed)
2nd attempt

## 2019-02-20 ENCOUNTER — Encounter: Payer: Self-pay | Admitting: Internal Medicine

## 2019-02-20 ENCOUNTER — Other Ambulatory Visit: Payer: Self-pay

## 2019-02-20 ENCOUNTER — Ambulatory Visit (INDEPENDENT_AMBULATORY_CARE_PROVIDER_SITE_OTHER): Payer: Medicaid Other | Admitting: Internal Medicine

## 2019-02-20 VITALS — Ht 64.5 in | Wt 179.0 lb

## 2019-02-20 DIAGNOSIS — R195 Other fecal abnormalities: Secondary | ICD-10-CM | POA: Diagnosis not present

## 2019-02-20 DIAGNOSIS — D509 Iron deficiency anemia, unspecified: Secondary | ICD-10-CM | POA: Diagnosis not present

## 2019-02-20 NOTE — Telephone Encounter (Signed)
Screening complete

## 2019-02-20 NOTE — Patient Instructions (Addendum)
1.  You will ultimately need colonoscopy and upper endoscopy if you are medically fit.  At this point, you are not and need to recover further from your recent hospitalization.  2.  I discussed with her and the prep alternatives and antinausea medicine for anticipated colonoscopy.  She finds this reassuring.  3.  I would like to see her in person in the office in 2 months for complete reassessment.  Dr. Blanch Media schedule does not currently go out that far.  Please call in the office next month to schedule this appointment for September.

## 2019-02-20 NOTE — Progress Notes (Signed)
HISTORY OF PRESENT ILLNESS:  Brenda Sims is a 63 y.o. female, native of [place] who moved to [place] early last year, who is referred today by the hospital regarding GI work-up of anemia.  I saw the patient February 25, 2018 as a new patient regarding iron deficiency anemia and Hemoccult-positive stool.  See that dictation for details.  Because of her multiple significant medical problems including COPD, coronary artery disease with congestive heart failure, insulin requiring diabetes mellitus, hypertension, advanced renal insufficiency, obesity, and sleep apnea she was scheduled for colonoscopy and upper endoscopy at the hospital with monitored anesthesia care.  Fortunately, the patient was a no-show for her procedures.  After getting in touch with her it appears that she had difficulties with the colonoscopy prep (nausea, cramping, diarrhea).  Since that time she has had multiple hospitalizations for exacerbation of her pulmonary condition including staying in the intensive care unit.  Also admitted for 2 days with symptomatic anemia.  She was transfused 2 units of blood for hemoglobin of 7.  Her most recent hemoglobin February 07, 2019 was 11.1.  Patient denies overt GI bleeding.  She denies having had prior GI ablations.  She apparently has seen a nephrologist with her last creatinine being over 3.  Throughout the course of the interview patient seems a bit confused regarding historical details of her health history.  She was hospitalized recently as February 07, 2019 for asthma exacerbation with atypical chest pain.  Negative cardiac work-up.  Uncontrolled diabetes.  Regarding her hospitalization for anemia, no GI consultation was obtained inpatient (which may have been helpful).  REVIEW OF SYSTEMS:  All non-GI ROS negative unless otherwise stated in the HPI except for anxiety, shortness of breath,  Past Medical History:  Diagnosis Date  . Anemia   . Anxiety   . CAD (coronary artery disease)   . CHF  (congestive heart failure) ([place])   . CKD (chronic kidney disease)   . COPD (chronic obstructive pulmonary disease) ([place])   . Depression   . Diabetes mellitus without complication ([place])   . Hypertension   . Hypothyroid   . MI (myocardial infarction) ([place])   . Sleep apnea    no cpap , "never gave me one"    Past Surgical History:  Procedure Laterality Date  . APPENDECTOMY    . BACK SURGERY    . LEFT HEART CATH AND CORONARY ANGIOGRAPHY N/A 09/11/2017   Procedure: LEFT HEART CATH AND CORONARY ANGIOGRAPHY;  Surgeon: Martinique, Peter M, MD;  Location: [facility];  Service: Cardiovascular;  Laterality: N/A;  . TOTAL KNEE ARTHROPLASTY Right 2007    Social History Khamia L Yott  reports that she has never smoked. She has never used smokeless tobacco. She reports that she does not drink alcohol or use drugs.  family history includes Hypertension in her mother.  Allergies  Allergen Reactions  . Eggs Or Egg-Derived Products Shortness Of Breath and Rash  . Azithromycin Swelling    Facial/throat swelling  . Cherry Swelling    Facial swelling  . Latex Rash       PHYSICAL EXAMINATION: No physical examination with telehealth medicine visit   ASSESSMENT:  1.  Iron deficiency anemia and Hemoccult-positive stool.  Rule out significant underlying GI mucosal pathology 2.  Chronic anemia.  Multifactorial including advanced renal disease 3.  Multiple significant cardiopulmonary comorbidities with recent hospitalization as well as insulin requiring diabetes with exacerbation   PLAN:  1.  Will ultimately need colonoscopy and  upper endoscopy if she is medically fit.  At this point, she is not in needs to recover further from her recent hospitalization. 2.  I discussed with her and the prep alternatives and antinausea medicine for anticipated colonoscopy.  She finds this reassuring. 3.  I would like to see her in person in the office in 2 months for complete reassessment This audio only  (the patient did not have the technical support) telehealth medicine visit was initiated by and consented for by the patient who was in her home while I was in my office.  She understands it may be associated charge for this 15-minute professional service

## 2019-02-23 ENCOUNTER — Telehealth: Payer: Self-pay | Admitting: *Deleted

## 2019-02-23 NOTE — Telephone Encounter (Signed)

## 2019-02-24 ENCOUNTER — Ambulatory Visit: Payer: Medicaid Other | Admitting: Cardiovascular Disease

## 2019-03-04 ENCOUNTER — Inpatient Hospital Stay (HOSPITAL_COMMUNITY)
Admission: EM | Admit: 2019-03-04 | Discharge: 2019-03-07 | DRG: 193 | Disposition: A | Discharge status: Home Health | Payer: Medicaid Other | Attending: Internal Medicine | Admitting: Internal Medicine

## 2019-03-04 ENCOUNTER — Emergency Department (HOSPITAL_COMMUNITY): Payer: Medicaid Other

## 2019-03-04 ENCOUNTER — Encounter (HOSPITAL_COMMUNITY): Payer: Self-pay

## 2019-03-04 ENCOUNTER — Other Ambulatory Visit: Payer: Self-pay

## 2019-03-04 DIAGNOSIS — N184 Chronic kidney disease, stage 4 (severe): Secondary | ICD-10-CM

## 2019-03-04 DIAGNOSIS — E1122 Type 2 diabetes mellitus with diabetic chronic kidney disease: Secondary | ICD-10-CM | POA: Diagnosis present

## 2019-03-04 DIAGNOSIS — F32A Depression, unspecified: Secondary | ICD-10-CM | POA: Diagnosis present

## 2019-03-04 DIAGNOSIS — I10 Essential (primary) hypertension: Secondary | ICD-10-CM | POA: Diagnosis not present

## 2019-03-04 DIAGNOSIS — Z7951 Long term (current) use of inhaled steroids: Secondary | ICD-10-CM

## 2019-03-04 DIAGNOSIS — D509 Iron deficiency anemia, unspecified: Secondary | ICD-10-CM | POA: Diagnosis present

## 2019-03-04 DIAGNOSIS — Z1159 Encounter for screening for other viral diseases: Secondary | ICD-10-CM | POA: Diagnosis not present

## 2019-03-04 DIAGNOSIS — Z96651 Presence of right artificial knee joint: Secondary | ICD-10-CM | POA: Diagnosis present

## 2019-03-04 DIAGNOSIS — Z8249 Family history of ischemic heart disease and other diseases of the circulatory system: Secondary | ICD-10-CM | POA: Diagnosis not present

## 2019-03-04 DIAGNOSIS — I130 Hypertensive heart and chronic kidney disease with heart failure and stage 1 through stage 4 chronic kidney disease, or unspecified chronic kidney disease: Secondary | ICD-10-CM | POA: Diagnosis present

## 2019-03-04 DIAGNOSIS — G4730 Sleep apnea, unspecified: Secondary | ICD-10-CM | POA: Diagnosis present

## 2019-03-04 DIAGNOSIS — J440 Chronic obstructive pulmonary disease with acute lower respiratory infection: Secondary | ICD-10-CM | POA: Diagnosis present

## 2019-03-04 DIAGNOSIS — I5022 Chronic systolic (congestive) heart failure: Secondary | ICD-10-CM | POA: Diagnosis present

## 2019-03-04 DIAGNOSIS — J189 Pneumonia, unspecified organism: Principal | ICD-10-CM | POA: Diagnosis present

## 2019-03-04 DIAGNOSIS — I2510 Atherosclerotic heart disease of native coronary artery without angina pectoris: Secondary | ICD-10-CM | POA: Diagnosis present

## 2019-03-04 DIAGNOSIS — F419 Anxiety disorder, unspecified: Secondary | ICD-10-CM

## 2019-03-04 DIAGNOSIS — Z91040 Latex allergy status: Secondary | ICD-10-CM

## 2019-03-04 DIAGNOSIS — I252 Old myocardial infarction: Secondary | ICD-10-CM

## 2019-03-04 DIAGNOSIS — Z794 Long term (current) use of insulin: Secondary | ICD-10-CM

## 2019-03-04 DIAGNOSIS — IMO0001 Reserved for inherently not codable concepts without codable children: Secondary | ICD-10-CM

## 2019-03-04 DIAGNOSIS — E1142 Type 2 diabetes mellitus with diabetic polyneuropathy: Secondary | ICD-10-CM

## 2019-03-04 DIAGNOSIS — J45901 Unspecified asthma with (acute) exacerbation: Secondary | ICD-10-CM | POA: Diagnosis not present

## 2019-03-04 DIAGNOSIS — F329 Major depressive disorder, single episode, unspecified: Secondary | ICD-10-CM | POA: Diagnosis present

## 2019-03-04 DIAGNOSIS — J9601 Acute respiratory failure with hypoxia: Secondary | ICD-10-CM | POA: Diagnosis present

## 2019-03-04 DIAGNOSIS — Z91018 Allergy to other foods: Secondary | ICD-10-CM | POA: Diagnosis not present

## 2019-03-04 DIAGNOSIS — E039 Hypothyroidism, unspecified: Secondary | ICD-10-CM | POA: Diagnosis present

## 2019-03-04 DIAGNOSIS — Z91012 Allergy to eggs: Secondary | ICD-10-CM | POA: Diagnosis not present

## 2019-03-04 DIAGNOSIS — Z881 Allergy status to other antibiotic agents status: Secondary | ICD-10-CM | POA: Diagnosis not present

## 2019-03-04 DIAGNOSIS — Z8674 Personal history of sudden cardiac arrest: Secondary | ICD-10-CM | POA: Diagnosis not present

## 2019-03-04 DIAGNOSIS — I5042 Chronic combined systolic (congestive) and diastolic (congestive) heart failure: Secondary | ICD-10-CM

## 2019-03-04 DIAGNOSIS — E119 Type 2 diabetes mellitus without complications: Secondary | ICD-10-CM | POA: Diagnosis not present

## 2019-03-04 DIAGNOSIS — Z7982 Long term (current) use of aspirin: Secondary | ICD-10-CM

## 2019-03-04 DIAGNOSIS — I509 Heart failure, unspecified: Secondary | ICD-10-CM

## 2019-03-04 DIAGNOSIS — E785 Hyperlipidemia, unspecified: Secondary | ICD-10-CM | POA: Diagnosis present

## 2019-03-04 LAB — CBC WITH DIFFERENTIAL/PLATELET
Abs Immature Granulocytes: 0.14 10*3/uL — ABNORMAL HIGH (ref 0.00–0.07)
Basophils Absolute: 0.1 10*3/uL (ref 0.0–0.1)
Basophils Relative: 0 %
Eosinophils Absolute: 0.3 10*3/uL (ref 0.0–0.5)
Eosinophils Relative: 2 %
HCT: 32.6 % — ABNORMAL LOW (ref 36.0–46.0)
Hemoglobin: 10 g/dL — ABNORMAL LOW (ref 12.0–15.0)
Immature Granulocytes: 1 %
Lymphocytes Relative: 11 %
Lymphs Abs: 1.6 10*3/uL (ref 0.7–4.0)
MCH: 26.4 pg (ref 26.0–34.0)
MCHC: 30.7 g/dL (ref 30.0–36.0)
MCV: 86 fL (ref 80.0–100.0)
Monocytes Absolute: 0.9 10*3/uL (ref 0.1–1.0)
Monocytes Relative: 6 %
Neutro Abs: 12 10*3/uL — ABNORMAL HIGH (ref 1.7–7.7)
Neutrophils Relative %: 80 %
Platelets: 342 10*3/uL (ref 150–400)
RBC: 3.79 MIL/uL — ABNORMAL LOW (ref 3.87–5.11)
RDW: 17.2 % — ABNORMAL HIGH (ref 11.5–15.5)
WBC: 15 10*3/uL — ABNORMAL HIGH (ref 4.0–10.5)
nRBC: 0 % (ref 0.0–0.2)

## 2019-03-04 LAB — URINALYSIS, ROUTINE W REFLEX MICROSCOPIC
Bilirubin Urine: NEGATIVE
Glucose, UA: NEGATIVE mg/dL
Hgb urine dipstick: NEGATIVE
Ketones, ur: NEGATIVE mg/dL
Leukocytes,Ua: NEGATIVE
Nitrite: NEGATIVE
Protein, ur: NEGATIVE mg/dL
Specific Gravity, Urine: 1.012 (ref 1.005–1.030)
pH: 5 (ref 5.0–8.0)

## 2019-03-04 LAB — TROPONIN I (HIGH SENSITIVITY)
Troponin I (High Sensitivity): 6 ng/L (ref <18)
Troponin I (High Sensitivity): 9 ng/L (ref <18)

## 2019-03-04 LAB — CBC
HCT: 29 % — ABNORMAL LOW (ref 36.0–46.0)
Hemoglobin: 8.9 g/dL — ABNORMAL LOW (ref 12.0–15.0)
MCH: 26.7 pg (ref 26.0–34.0)
MCHC: 30.7 g/dL (ref 30.0–36.0)
MCV: 87.1 fL (ref 80.0–100.0)
Platelets: 322 10*3/uL (ref 150–400)
RBC: 3.33 MIL/uL — ABNORMAL LOW (ref 3.87–5.11)
RDW: 17.5 % — ABNORMAL HIGH (ref 11.5–15.5)
WBC: 14.1 10*3/uL — ABNORMAL HIGH (ref 4.0–10.5)
nRBC: 0 % (ref 0.0–0.2)

## 2019-03-04 LAB — BASIC METABOLIC PANEL
Anion gap: 10 (ref 5–15)
BUN: 63 mg/dL — ABNORMAL HIGH (ref 8–23)
CO2: 23 mmol/L (ref 22–32)
Calcium: 9.2 mg/dL (ref 8.9–10.3)
Chloride: 104 mmol/L (ref 98–111)
Creatinine, Ser: 3 mg/dL — ABNORMAL HIGH (ref 0.44–1.00)
GFR calc Af Amer: 18 mL/min — ABNORMAL LOW (ref >60)
GFR calc non Af Amer: 16 mL/min — ABNORMAL LOW (ref >60)
Glucose, Bld: 229 mg/dL — ABNORMAL HIGH (ref 70–99)
Potassium: 4.4 mmol/L (ref 3.5–5.1)
Sodium: 137 mmol/L (ref 135–145)

## 2019-03-04 LAB — CREATININE, SERUM
Creatinine, Ser: 3.09 mg/dL — ABNORMAL HIGH (ref 0.44–1.00)
GFR calc Af Amer: 18 mL/min — ABNORMAL LOW (ref >60)
GFR calc non Af Amer: 15 mL/min — ABNORMAL LOW (ref >60)

## 2019-03-04 LAB — GLUCOSE, CAPILLARY: Glucose-Capillary: 262 mg/dL — ABNORMAL HIGH (ref 70–99)

## 2019-03-04 LAB — PROTIME-INR
INR: 1.1 (ref 0.8–1.2)
Prothrombin Time: 14.4 seconds (ref 11.4–15.2)

## 2019-03-04 LAB — CBG MONITORING, ED
Glucose-Capillary: 215 mg/dL — ABNORMAL HIGH (ref 70–99)
Glucose-Capillary: 309 mg/dL — ABNORMAL HIGH (ref 70–99)

## 2019-03-04 LAB — APTT: aPTT: 35 seconds (ref 24–36)

## 2019-03-04 LAB — BRAIN NATRIURETIC PEPTIDE: B Natriuretic Peptide: 43.9 pg/mL (ref 0.0–100.0)

## 2019-03-04 LAB — SARS CORONAVIRUS 2 BY RT PCR (HOSPITAL ORDER, PERFORMED IN ~~LOC~~ HOSPITAL LAB): SARS Coronavirus 2: NEGATIVE

## 2019-03-04 LAB — LACTIC ACID, PLASMA: Lactic Acid, Venous: 1.1 mmol/L (ref 0.5–1.9)

## 2019-03-04 MED ORDER — SODIUM CHLORIDE 0.9 % IV SOLN
100.0000 mg | Freq: Two times a day (BID) | INTRAVENOUS | Status: DC
  Administered 2019-03-04 – 2019-03-05 (×2): 100 mg via INTRAVENOUS
  Filled 2019-03-04 (×4): qty 100

## 2019-03-04 MED ORDER — MONTELUKAST SODIUM 10 MG PO TABS
10.0000 mg | ORAL_TABLET | Freq: Every day | ORAL | Status: DC
  Administered 2019-03-05 – 2019-03-06 (×2): 10 mg via ORAL
  Filled 2019-03-04 (×3): qty 1

## 2019-03-04 MED ORDER — INSULIN ASPART 100 UNIT/ML ~~LOC~~ SOLN
0.0000 [IU] | Freq: Three times a day (TID) | SUBCUTANEOUS | Status: DC
  Administered 2019-03-04: 5 [IU] via SUBCUTANEOUS
  Administered 2019-03-05: 3 [IU] via SUBCUTANEOUS
  Administered 2019-03-05: 5 [IU] via SUBCUTANEOUS
  Administered 2019-03-05: 2 [IU] via SUBCUTANEOUS
  Administered 2019-03-06: 5 [IU] via SUBCUTANEOUS
  Administered 2019-03-07: 2 [IU] via SUBCUTANEOUS
  Filled 2019-03-04: qty 0.15

## 2019-03-04 MED ORDER — HEPARIN SODIUM (PORCINE) 5000 UNIT/ML IJ SOLN
5000.0000 [IU] | Freq: Three times a day (TID) | INTRAMUSCULAR | Status: DC
  Administered 2019-03-04 – 2019-03-06 (×7): 5000 [IU] via SUBCUTANEOUS
  Filled 2019-03-04 (×9): qty 1

## 2019-03-04 MED ORDER — FLUOXETINE HCL 20 MG PO CAPS
20.0000 mg | ORAL_CAPSULE | Freq: Every day | ORAL | Status: DC
  Administered 2019-03-04 – 2019-03-07 (×4): 20 mg via ORAL
  Filled 2019-03-04 (×4): qty 1

## 2019-03-04 MED ORDER — SPIRONOLACTONE 25 MG PO TABS
50.0000 mg | ORAL_TABLET | Freq: Every day | ORAL | Status: DC
  Administered 2019-03-04 – 2019-03-07 (×4): 50 mg via ORAL
  Filled 2019-03-04 (×4): qty 2

## 2019-03-04 MED ORDER — HYOSCYAMINE SULFATE 0.125 MG SL SUBL
0.1250 mg | SUBLINGUAL_TABLET | SUBLINGUAL | Status: DC | PRN
  Filled 2019-03-04: qty 1

## 2019-03-04 MED ORDER — ALBUTEROL SULFATE (2.5 MG/3ML) 0.083% IN NEBU
2.5000 mg | INHALATION_SOLUTION | Freq: Four times a day (QID) | RESPIRATORY_TRACT | Status: DC | PRN
  Administered 2019-03-04 – 2019-03-05 (×2): 2.5 mg via RESPIRATORY_TRACT
  Filled 2019-03-04 (×2): qty 3

## 2019-03-04 MED ORDER — FLUTICASONE PROPIONATE 50 MCG/ACT NA SUSP: 2.0000 | Freq: Every day | NASAL | Status: DC | PRN

## 2019-03-04 MED ORDER — ROSUVASTATIN CALCIUM 20 MG PO TABS
40.0000 mg | ORAL_TABLET | Freq: Every day | ORAL | Status: DC
  Filled 2019-03-04: qty 2
  Filled 2019-03-04: qty 1
  Filled 2019-03-04: qty 2

## 2019-03-04 MED ORDER — INSULIN ASPART 100 UNIT/ML ~~LOC~~ SOLN
0.0000 [IU] | Freq: Every day | SUBCUTANEOUS | Status: DC
  Administered 2019-03-04: 3 [IU] via SUBCUTANEOUS
  Filled 2019-03-04: qty 0.05

## 2019-03-04 MED ORDER — SODIUM CHLORIDE 0.9 % IV SOLN
2.0000 g | Freq: Once | INTRAVENOUS | Status: AC
  Administered 2019-03-04: 2 g via INTRAVENOUS
  Filled 2019-03-04: qty 2

## 2019-03-04 MED ORDER — SODIUM CHLORIDE 0.9 % IV SOLN
100.0000 mg | Freq: Once | INTRAVENOUS | Status: AC
  Administered 2019-03-04: 100 mg via INTRAVENOUS
  Filled 2019-03-04: qty 100

## 2019-03-04 MED ORDER — DIPHENHYDRAMINE HCL 25 MG PO CAPS: 25.0000 mg | ORAL_CAPSULE | Freq: Four times a day (QID) | ORAL | Status: DC | PRN

## 2019-03-04 MED ORDER — SENNA 8.6 MG PO TABS
2.0000 | ORAL_TABLET | Freq: Two times a day (BID) | ORAL | Status: DC
  Administered 2019-03-04 – 2019-03-07 (×6): 17.2 mg via ORAL
  Filled 2019-03-04 (×6): qty 2

## 2019-03-04 MED ORDER — ALBUTEROL SULFATE HFA 108 (90 BASE) MCG/ACT IN AERS
1.0000 | INHALATION_SPRAY | Freq: Four times a day (QID) | RESPIRATORY_TRACT | Status: DC | PRN
  Administered 2019-03-04: 2 via RESPIRATORY_TRACT
  Filled 2019-03-04: qty 6.7

## 2019-03-04 MED ORDER — VANCOMYCIN HCL IN DEXTROSE 1-5 GM/200ML-% IV SOLN
1000.0000 mg | Freq: Once | INTRAVENOUS | Status: AC
  Administered 2019-03-04: 1000 mg via INTRAVENOUS
  Filled 2019-03-04: qty 200

## 2019-03-04 MED ORDER — ALBUTEROL SULFATE (2.5 MG/3ML) 0.083% IN NEBU
2.5000 mg | INHALATION_SOLUTION | Freq: Four times a day (QID) | RESPIRATORY_TRACT | Status: DC
  Administered 2019-03-05: 02:00:00 2.5 mg via RESPIRATORY_TRACT
  Filled 2019-03-04: qty 3

## 2019-03-04 MED ORDER — LORATADINE 10 MG PO TABS
10.0000 mg | ORAL_TABLET | Freq: Every day | ORAL | Status: DC
  Administered 2019-03-04 – 2019-03-07 (×4): 10 mg via ORAL
  Filled 2019-03-04 (×4): qty 1

## 2019-03-04 MED ORDER — SODIUM CHLORIDE 0.9 % IV SOLN: 2.0000 g | INTRAVENOUS | Status: DC

## 2019-03-04 MED ORDER — FUROSEMIDE 40 MG PO TABS
40.0000 mg | ORAL_TABLET | Freq: Two times a day (BID) | ORAL | Status: DC
  Administered 2019-03-04 – 2019-03-05 (×3): 40 mg via ORAL
  Filled 2019-03-04 (×3): qty 1

## 2019-03-04 MED ORDER — FERROUS SULFATE 325 (65 FE) MG PO TABS
325.0000 mg | ORAL_TABLET | Freq: Three times a day (TID) | ORAL | Status: DC
  Administered 2019-03-04 – 2019-03-07 (×8): 325 mg via ORAL
  Filled 2019-03-04 (×9): qty 1

## 2019-03-04 MED ORDER — ALBUTEROL SULFATE HFA 108 (90 BASE) MCG/ACT IN AERS
6.0000 | INHALATION_SPRAY | Freq: Once | RESPIRATORY_TRACT | Status: AC
  Administered 2019-03-04: 6 via RESPIRATORY_TRACT
  Filled 2019-03-04: qty 6.7

## 2019-03-04 MED ORDER — VANCOMYCIN HCL IN DEXTROSE 750-5 MG/150ML-% IV SOLN: 750.0000 mg | INTRAVENOUS | Status: DC

## 2019-03-04 MED ORDER — BUSPIRONE HCL 5 MG PO TABS
5.0000 mg | ORAL_TABLET | Freq: Three times a day (TID) | ORAL | Status: DC
  Administered 2019-03-04 – 2019-03-07 (×8): 5 mg via ORAL
  Filled 2019-03-04 (×9): qty 1

## 2019-03-04 MED ORDER — ADULT MULTIVITAMIN W/MINERALS CH
1.0000 | ORAL_TABLET | Freq: Every day | ORAL | Status: DC
  Administered 2019-03-04 – 2019-03-07 (×4): 1 via ORAL
  Filled 2019-03-04 (×4): qty 1

## 2019-03-04 MED ORDER — INSULIN GLARGINE 100 UNIT/ML ~~LOC~~ SOLN
20.0000 [IU] | Freq: Two times a day (BID) | SUBCUTANEOUS | Status: DC
  Administered 2019-03-04 – 2019-03-06 (×5): 20 [IU] via SUBCUTANEOUS
  Filled 2019-03-04 (×7): qty 0.2

## 2019-03-04 MED ORDER — GABAPENTIN 400 MG PO CAPS
400.0000 mg | ORAL_CAPSULE | Freq: Two times a day (BID) | ORAL | Status: DC
  Administered 2019-03-04 – 2019-03-07 (×6): 400 mg via ORAL
  Filled 2019-03-04 (×6): qty 1

## 2019-03-04 MED ORDER — ASPIRIN EC 81 MG PO TBEC
81.0000 mg | DELAYED_RELEASE_TABLET | Freq: Every day | ORAL | Status: DC
  Administered 2019-03-04 – 2019-03-07 (×4): 81 mg via ORAL
  Filled 2019-03-04 (×4): qty 1

## 2019-03-04 MED ORDER — POTASSIUM CHLORIDE CRYS ER 10 MEQ PO TBCR
10.0000 meq | EXTENDED_RELEASE_TABLET | Freq: Every day | ORAL | Status: DC
  Administered 2019-03-04 – 2019-03-07 (×4): 10 meq via ORAL
  Filled 2019-03-04 (×4): qty 1

## 2019-03-04 MED ORDER — NITROGLYCERIN 0.4 MG SL SUBL: 0.4000 mg | SUBLINGUAL_TABLET | SUBLINGUAL | Status: DC | PRN

## 2019-03-04 MED ORDER — SODIUM CHLORIDE 0.9 % IV SOLN
1.0000 g | INTRAVENOUS | Status: DC
  Administered 2019-03-05: 1 g via INTRAVENOUS
  Filled 2019-03-04: qty 1
  Filled 2019-03-04: qty 10

## 2019-03-04 NOTE — H&P (Signed)
.  History and Physical    Brenda Sims HGD:[id number] DOB: [birth year] DOA: 03/04/2019  PCP: Leonard Downing, MD  Patient coming from: Home   Chief Complaint: "I'm coughing and I hurt."  HPI: Brenda Sims is a 63 y.o. female with medical history significant of HTN, IDDM, CKD 4, asthma, anxiety, systolic HF. Presents to ED w/o complaints of 2 days of cough and general chest pain. She reports that her symptoms began on Monday. She states that she felt "a little swollen" and found it difficult to breathe. She said she began coughing and thought it was fluid. So she decided to take "an extra water pill". It did not help. Her symptoms worsened throughout the day into the next. As she continued to cough, she developed midsteral and axial chest pain. It is worsened with cough and deep inspiration. She decided that she couldn't tolerate it any more, so she came to the ED. She denies any sick contacts, recent travel, fevers, N/V. Other than taking an extra diuretic Monday, she denies any changes in medication.   ED Course: Upon arrival to ED, she was found to be hypoxic w/ sats down to 89 on RA. She recovered to 100 on 2L [place]. CXR showed progressive diffuse bilateral pulmonary infiltrates/edema. Low lung volumes. BNP was normal. She was started on vanc/cefepime. TRH was called for admission.   Review of Systems: Remainder of 10 point review of systems is otherwise negative for all not mentioned in HPI.    Past Medical History:  Diagnosis Date  . Anemia   . Anxiety   . CAD (coronary artery disease)   . CHF (congestive heart failure) ([place])   . CKD (chronic kidney disease)   . COPD (chronic obstructive pulmonary disease) ([place])   . Depression   . Diabetes mellitus without complication ([place])   . Hypertension   . Hypothyroid   . MI (myocardial infarction) ([place])   . Sleep apnea    no cpap , "never gave me one"    Past Surgical History:  Procedure Laterality Date  . APPENDECTOMY    . BACK  SURGERY    . LEFT HEART CATH AND CORONARY ANGIOGRAPHY N/A 09/11/2017   Procedure: LEFT HEART CATH AND CORONARY ANGIOGRAPHY;  Surgeon: Martinique, Peter M, MD;  Location: [facility];  Service: Cardiovascular;  Laterality: N/A;  . TOTAL KNEE ARTHROPLASTY Right 2007     reports that she has never smoked. She has never used smokeless tobacco. She reports that she does not drink alcohol or use drugs.  Allergies  Allergen Reactions  . Eggs Or Egg-Derived Products Shortness Of Breath and Rash  . Azithromycin Swelling    Facial/throat swelling  . Cherry Swelling    Facial swelling  . Latex Rash    Family History  Problem Relation Age of Onset  . Hypertension Mother   . Colon cancer Neg Hx   . Esophageal cancer Neg Hx     Prior to Admission medications   Medication Sig Start Date End Date Taking? Authorizing Provider  ACCU-CHEK SOFTCLIX LANCETS lancets Use as instructed 10/16/17   Brayton Caves, PA-C  albuterol (PROVENTIL HFA;VENTOLIN HFA) 108 (90 Base) MCG/ACT inhaler Inhale 1-2 puffs into the lungs every 6 (six) hours as needed for shortness of breath.    [provider]  amLODipine (NORVASC) 10 MG tablet Take 1 tablet (10 mg total) by mouth daily. MUST MAKE APPT FOR FURTHER REFILLS Patient taking differently: Take 10 mg by mouth  daily.  06/24/18   Charlott Rakes, MD  aspirin EC 81 MG tablet Take 1 tablet (81 mg total) by mouth daily. Patient taking differently: Take 81 mg by mouth 2 (two) times daily.  10/16/17   Brayton Caves, PA-C  Blood Glucose Monitoring Suppl (TRUE METRIX METER) DEVI 1 kit by Does not apply route 4 (four) times daily. 10/16/17   Brayton Caves, PA-C  busPIRone (BUSPAR) 5 MG tablet Take 1 tablet (5 mg total) by mouth 3 (three) times daily. 01/21/18   Charlott Rakes, MD  carvedilol (COREG) 25 MG tablet TAKE 1 TABLET BY MOUTH 2 (TWO) TIMES DAILY WITH A MEAL. Patient taking differently: Take 25 mg by mouth 2 (two) times daily with a meal.  05/29/18    Charlott Rakes, MD  cetirizine (ZYRTEC) 10 MG tablet TAKE 1 TABLET (10 MG TOTAL) BY MOUTH DAILY. 03/11/18   Charlott Rakes, MD  cyclobenzaprine (FLEXERIL) 10 MG tablet Take 1 tablet (10 mg total) by mouth 3 (three) times daily as needed for muscle spasms. 06/28/18   Purohit, Konrad Dolores, MD  diphenhydrAMINE (BENADRYL) 25 MG tablet Take 25 mg by mouth every 6 (six) hours as needed for itching or allergies.    [provider]  ferrous sulfate 325 (65 FE) MG tablet Take 325 mg by mouth 3 (three) times daily. 01/02/19   [provider]  FLUoxetine (PROZAC) 20 MG capsule Take 20 mg by mouth daily. 05/13/18   [provider]  fluticasone (FLONASE) 50 MCG/ACT nasal spray Place 2 sprays into both nostrils daily as needed for allergies or rhinitis. 12/15/18   [provider]  furosemide (LASIX) 40 MG tablet Take 2 tablets (80 mg total) by mouth every morning AND 1 tablet (40 mg total) every evening. 02/24/18 02/19/19  Croitoru, Mihai, MD  gabapentin (NEURONTIN) 400 MG capsule Take 1 capsule (400 mg total) by mouth 2 (two) times daily. 02/07/19 04/08/19  Damita Lack, MD  glucose blood (ACCU-CHEK AVIVA) test strip Use as instructed 10/16/17   Brayton Caves, PA-C  glucose blood (TRUE METRIX BLOOD GLUCOSE TEST) test strip Use as instructed 10/16/17   Brayton Caves, PA-C  hydrALAZINE (APRESOLINE) 100 MG tablet Take 1 tablet (100 mg total) by mouth 3 (three) times daily. 01/21/18   Charlott Rakes, MD  Hyoscyamine Sulfate SL (LEVSIN/SL) 0.125 MG SUBL Place 1 tablet under the tongue every 4 (four) hours as needed. Patient taking differently: Place 1 tablet under the tongue every 4 (four) hours as needed (for abdominal cramping).  05/07/18   Charlann Lange, PA-C  Insulin Glargine (LANTUS SOLOSTAR) 100 UNIT/ML Solostar Pen Inject 20 Units into the skin 2 (two) times daily. Patient taking differently: Inject 20-30 Units into the skin 3 (three) times daily. Take 30 units in the morning and at  noon. Take 20 units with supper 01/21/18   Charlott Rakes, MD  insulin lispro (HUMALOG KWIKPEN) 100 UNIT/ML KwikPen Inject 4 Units into the skin 3 (three) times daily.    [provider]  Insulin Pen Needle (TRUEPLUS PEN NEEDLES) 31G X 6 MM MISC Use as directed 11/08/17   Charlott Rakes, MD  losartan (COZAAR) 100 MG tablet Take 100 mg by mouth daily. 05/13/18   [provider]  montelukast (SINGULAIR) 10 MG tablet Take 1 tablet (10 mg total) by mouth at bedtime. 01/21/18   Charlott Rakes, MD  Multiple Vitamin (MULTIVITAMIN) tablet Take 1 tablet by mouth daily.    [provider]  nitroGLYCERIN (NITROSTAT) 0.4  MG SL tablet Place 1 tablet (0.4 mg total) under the tongue every 5 (five) minutes as needed for chest pain. 02/12/19   Croitoru, Mihai, MD  potassium chloride SA (K-DUR,KLOR-CON) 10 MEQ tablet Take 1 tablet (10 mEq total) by mouth daily. 01/21/18   Charlott Rakes, MD  rosuvastatin (CRESTOR) 40 MG tablet Take 1 tablet (40 mg total) by mouth daily. Patient taking differently: Take 40 mg by mouth at bedtime.  01/21/18 01/17/19  Charlott Rakes, MD  senna (SENOKOT) 8.6 MG TABS tablet Take 2 tablets by mouth 2 (two) times a day.    [provider]  spironolactone (ALDACTONE) 50 MG tablet Take 1 tablet (50 mg total) by mouth daily. 01/21/18   Charlott Rakes, MD  TRUEPLUS LANCETS 28G MISC 28 g by Does not apply route 4 (four) times daily. 10/16/17   Brayton Caves, PA-C    Physical Exam: Vitals:   03/04/19 0830 03/04/19 0904 03/04/19 0930 03/04/19 1004  BP: 115/61 (!) 124/58 120/66 (!) 124/49  Pulse: 84 91 87 91  Resp: (!) 22 (!) 25 (!) 25 18  Temp:      TempSrc:      SpO2: (S) 94% 94% 92% 95%  Weight:      Height:        Constitutional: 63 y.o. female NAD, calm, comfortable Vitals:   03/04/19 0830 03/04/19 0904 03/04/19 0930 03/04/19 1004  BP: 115/61 (!) 124/58 120/66 (!) 124/49  Pulse: 84 91 87 91  Resp: (!) 22 (!) 25 (!) 25 18  Temp:      TempSrc:       SpO2: (S) 94% 94% 92% 95%  Weight:      Height:       Eyes: PERRL, lids and conjunctivae normal ENMT: Mucous membranes are moist. Posterior pharynx clear of any exudate or lesions.Normal dentition.  Neck: normal, supple, no masses, no thyromegaly Respiratory: bilat wheeze noted; some rhonchi at bases, normal WOB Cardiovascular: Regular rate and rhythm, no murmurs / rubs / gallops. No extremity edema. 2+ pedal pulses. No carotid bruits.  Abdomen: no tenderness, no masses palpated. No hepatosplenomegaly. Bowel sounds positive.  Musculoskeletal: no clubbing / cyanosis. No joint deformity upper and lower extremities. Good ROM, no contractures. Normal muscle tone.  Skin: no rashes, lesions, ulcers. No induration Neurologic: CN 2-12 grossly intact. Sensation intact, DTR normal. Strength 5/5 in all 4.  Psychiatric:  Alert and oriented x 3. Agitated    Labs on Admission: I have personally reviewed following labs and imaging studies  CBC: Recent Labs  Lab 03/04/19 0641  WBC 15.0*  NEUTROABS 12.0*  HGB 10.0*  HCT 32.6*  MCV 86.0  PLT 103   Basic Metabolic Panel: Recent Labs  Lab 03/04/19 0641  NA 137  K 4.4  CL 104  CO2 23  GLUCOSE 229*  BUN 63*  CREATININE 3.00*  CALCIUM 9.2   GFR: Estimated Creatinine Clearance: 20 mL/min (A) (by C-G formula based on SCr of 3 mg/dL (H)). Liver Function Tests: No results for input(s): AST, ALT, ALKPHOS, BILITOT, PROT, ALBUMIN in the last 168 hours. No results for input(s): LIPASE, AMYLASE in the last 168 hours. No results for input(s): AMMONIA in the last 168 hours. Coagulation Profile: Recent Labs  Lab 03/04/19 0751  INR 1.1   Cardiac Enzymes: No results for input(s): CKTOTAL, CKMB, CKMBINDEX, TROPONINI in the last 168 hours. BNP (last 3 results) No results for input(s): PROBNP in the last 8760 hours. HbA1C: No results for input(s): HGBA1C  in the last 72 hours. CBG: No results for input(s): GLUCAP in the last 168 hours. Lipid  Profile: No results for input(s): CHOL, HDL, LDLCALC, TRIG, CHOLHDL, LDLDIRECT in the last 72 hours. Thyroid Function Tests: No results for input(s): TSH, T4TOTAL, FREET4, T3FREE, THYROIDAB in the last 72 hours. Anemia Panel: No results for input(s): VITAMINB12, FOLATE, FERRITIN, TIBC, IRON, RETICCTPCT in the last 72 hours. Urine analysis:    Component Value Date/Time   COLORURINE YELLOW 02/06/2019 0354   APPEARANCEUR CLEAR 02/06/2019 0354   LABSPEC 1.009 02/06/2019 0354   PHURINE 5.0 02/06/2019 0354   GLUCOSEU NEGATIVE 02/06/2019 0354   HGBUR NEGATIVE 02/06/2019 0354   BILIRUBINUR NEGATIVE 02/06/2019 0354   KETONESUR NEGATIVE 02/06/2019 0354   PROTEINUR NEGATIVE 02/06/2019 0354   NITRITE NEGATIVE 02/06/2019 0354   LEUKOCYTESUR NEGATIVE 02/06/2019 0354    Radiological Exams on Admission: Dg Chest Portable 1 View  Result Date: 03/04/2019 CLINICAL DATA:  Shortness of breath. EXAM: PORTABLE CHEST 1 VIEW COMPARISON:  02/05/2019. FINDINGS: Surgical clips upper chest. Heart size stable. Diffuse bilateral pulmonary infiltrates/edema again noted. Interim progression from prior exam. Low lung volumes. No pleural effusion or pneumothorax. IMPRESSION: Progressive diffuse bilateral pulmonary infiltrates/edema. Low lung volumes. Electronically Signed   By: Marcello Moores  Register   On: 03/04/2019 07:46    Assessment/Plan Active Problems:   Essential hypertension   CKD (chronic kidney disease) stage 4, GFR 15-29 ml/min (HCC)   IDDM (insulin dependent diabetes mellitus) (HCC)   Anxiety and depression   Community acquired pneumonia   Asthma, chronic, unspecified asthma severity, with acute exacerbation  Bilateral PNA; likely CAP     - COVID 19 negative     - vanc, cefepime, doxy in ED; can continue for right now; will likely de-escalate to rocephin/doxy as she stays stable     - wean O2     - Albuterol PRN     - admit obs  CKD 4     - watch nephrotoxins     - baseline SCr is 3     - continue  home spironolactone, lasix  IDDM     - lantus 20units BID     - SSI     - check A1c  Asthma     - albuterol  HTN     - BP soft right now, monitor, resume home regimen as BP improve  Anxiety/Depression     - continue prozac, buspar  HLD     - continue home crestor  Iron deficiency anemia     - no evidence of frank bleed     - continue iron supplement  CAD     - continue ASA, crestor  Chronic systolic HF     - BNP normal     - continue lasix, spironolactone     - resume coreg, lostartan as BP tolerates  DVT prophylaxis: Heparin  Code Status: FULL  Family Communication: None at bedside.   Disposition Plan: TBD  Consults called: None  Admission status: Observation.  Will place in observation due to CXR findings and increased O2 need/DOE. Expect that she will turn around in less than 2MN, however.   Jonnie Finner DO [facility] Pager [phone]  If 7PM-7AM, please contact night-coverage [email or web address] Password [facility]  03/04/2019, 11:20 AM

## 2019-03-04 NOTE — ED Provider Notes (Addendum)
[facility] DEPT Provider Note   CSN: [id number] Arrival date & time: 03/04/19  0516    History   Chief Complaint Chief Complaint  Patient presents with  . Chest Pain  . Shortness of Breath    HPI Brenda Sims is a 63 y.o. female.    HPI  63 year old with history of CAD, CHF, CKD, COPD comes in a chief complaint of shortness of breath. Patient states that she has been having a cough for the last several days along with shortness of breath with exertion.  Yesterday she started having chest pain.  Chest pain is midsternal and radiates to the left side.  Chest pain is described as sharp pain that is worse with inspiration.  The cough is producing mostly clear phlegm.  She denies any fevers, chills.  Patient was recently admitted to the hospital for CHF.  She had similar complaints and was treated for COPD exacerbation and pneumonia as well.  Past Medical History:  Diagnosis Date  . Anemia   . Anxiety   . CAD (coronary artery disease)   . CHF (congestive heart failure) ([place])   . CKD (chronic kidney disease)   . COPD (chronic obstructive pulmonary disease) ([place])   . Depression   . Diabetes mellitus without complication ([place])   . Hypertension   . Hypothyroid   . MI (myocardial infarction) ([place])   . Sleep apnea    no cpap , "never gave me one"    Patient Active Problem List   Diagnosis Date Noted  . Asthma exacerbation 02/05/2019  . Community acquired pneumonia 08/13/2018  . Upper airway cough syndrome 07/04/2018  . CHF exacerbation ([place]) 06/27/2018  . Left shoulder pain 06/27/2018  . COPD exacerbation ([place]) 03/05/2018  . CAD (coronary artery disease) 03/05/2018  . Iron deficiency anemia 02/26/2018  . Sleep apnea 11/01/2017  . HLD (hyperlipidemia) 10/19/2017  . Anxiety and depression 10/19/2017  . Chest pain 10/19/2017  . Hypoglycemia   . Hypoxia   . Bronchitis 10/07/2017  . Chronic diastolic heart failure ([place]) 10/07/2017  . Acute  renal failure superimposed on stage 3 chronic kidney disease ([place]) 10/07/2017  . Hypokalemia 10/07/2017  . Normocytic normochromic anemia 10/07/2017  . Type II diabetes mellitus with renal manifestations ([place])   . Leukocytosis   . Acute blood loss anemia   . Seizures ([place])   . Coronary artery disease involving native coronary artery of native heart with angina pectoris ([place]) 09/05/2017  . History of cardiac arrest 09/03/2017  . Cardiopulmonary arrest ([place])   . Acute encephalopathy   . Acute respiratory failure with hypoxia ([place])   . Acute on chronic combined systolic and diastolic CHF (congestive heart failure) ([place]) 08/29/2017  . Essential hypertension 08/29/2017  . Type 1 diabetes mellitus without complication ([place]) 62/37/6283  . Tobacco abuse 08/29/2017  . Acute pulmonary edema (HCC)   . CKD (chronic kidney disease) stage 4, GFR 15-29 ml/min (HCC)   . Dyspnea on exertion     Past Surgical History:  Procedure Laterality Date  . APPENDECTOMY    . BACK SURGERY    . LEFT HEART CATH AND CORONARY ANGIOGRAPHY N/A 09/11/2017   Procedure: LEFT HEART CATH AND CORONARY ANGIOGRAPHY;  Surgeon: Martinique, Peter M, MD;  Location: [facility];  Service: Cardiovascular;  Laterality: N/A;  . TOTAL KNEE ARTHROPLASTY Right 2007     OB History   No obstetric history on file.      Home Medications  Prior to Admission medications   Medication Sig Start Date End Date Taking? Authorizing Provider  ACCU-CHEK SOFTCLIX LANCETS lancets Use as instructed 10/16/17   Brayton Caves, PA-C  albuterol (PROVENTIL HFA;VENTOLIN HFA) 108 (90 Base) MCG/ACT inhaler Inhale 1-2 puffs into the lungs every 6 (six) hours as needed for shortness of breath.    [provider]  amLODipine (NORVASC) 10 MG tablet Take 1 tablet (10 mg total) by mouth daily. MUST MAKE APPT FOR FURTHER REFILLS Patient taking differently: Take 10 mg by mouth daily.  06/24/18   Charlott Rakes, MD  aspirin EC 81 MG tablet Take 1  tablet (81 mg total) by mouth daily. Patient taking differently: Take 81 mg by mouth 2 (two) times daily.  10/16/17   Brayton Caves, PA-C  Blood Glucose Monitoring Suppl (TRUE METRIX METER) DEVI 1 kit by Does not apply route 4 (four) times daily. 10/16/17   Brayton Caves, PA-C  busPIRone (BUSPAR) 5 MG tablet Take 1 tablet (5 mg total) by mouth 3 (three) times daily. 01/21/18   Charlott Rakes, MD  carvedilol (COREG) 25 MG tablet TAKE 1 TABLET BY MOUTH 2 (TWO) TIMES DAILY WITH A MEAL. Patient taking differently: Take 25 mg by mouth 2 (two) times daily with a meal.  05/29/18   Charlott Rakes, MD  cetirizine (ZYRTEC) 10 MG tablet TAKE 1 TABLET (10 MG TOTAL) BY MOUTH DAILY. 03/11/18   Charlott Rakes, MD  cyclobenzaprine (FLEXERIL) 10 MG tablet Take 1 tablet (10 mg total) by mouth 3 (three) times daily as needed for muscle spasms. 06/28/18   Purohit, Konrad Dolores, MD  diphenhydrAMINE (BENADRYL) 25 MG tablet Take 25 mg by mouth every 6 (six) hours as needed for itching or allergies.    [provider]  ferrous sulfate 325 (65 FE) MG tablet Take 325 mg by mouth 3 (three) times daily. 01/02/19   [provider]  FLUoxetine (PROZAC) 20 MG capsule Take 20 mg by mouth daily. 05/13/18   [provider]  fluticasone (FLONASE) 50 MCG/ACT nasal spray Place 2 sprays into both nostrils daily as needed for allergies or rhinitis. 12/15/18   [provider]  furosemide (LASIX) 40 MG tablet Take 2 tablets (80 mg total) by mouth every morning AND 1 tablet (40 mg total) every evening. 02/24/18 02/19/19  Croitoru, Mihai, MD  gabapentin (NEURONTIN) 400 MG capsule Take 1 capsule (400 mg total) by mouth 2 (two) times daily. 02/07/19 04/08/19  Damita Lack, MD  glucose blood (ACCU-CHEK AVIVA) test strip Use as instructed 10/16/17   Brayton Caves, PA-C  glucose blood (TRUE METRIX BLOOD GLUCOSE TEST) test strip Use as instructed 10/16/17   Brayton Caves, PA-C  hydrALAZINE (APRESOLINE) 100 MG tablet  Take 1 tablet (100 mg total) by mouth 3 (three) times daily. 01/21/18   Charlott Rakes, MD  Hyoscyamine Sulfate SL (LEVSIN/SL) 0.125 MG SUBL Place 1 tablet under the tongue every 4 (four) hours as needed. Patient taking differently: Place 1 tablet under the tongue every 4 (four) hours as needed (for abdominal cramping).  05/07/18   Charlann Lange, PA-C  Insulin Glargine (LANTUS SOLOSTAR) 100 UNIT/ML Solostar Pen Inject 20 Units into the skin 2 (two) times daily. Patient taking differently: Inject 20-30 Units into the skin 3 (three) times daily. Take 30 units in the morning and at noon. Take 20 units with supper 01/21/18   Charlott Rakes, MD  insulin lispro (HUMALOG KWIKPEN) 100 UNIT/ML KwikPen Inject 4 Units into the skin 3 (  three) times daily.    [provider]  Insulin Pen Needle (TRUEPLUS PEN NEEDLES) 31G X 6 MM MISC Use as directed 11/08/17   Charlott Rakes, MD  losartan (COZAAR) 100 MG tablet Take 100 mg by mouth daily. 05/13/18   [provider]  montelukast (SINGULAIR) 10 MG tablet Take 1 tablet (10 mg total) by mouth at bedtime. 01/21/18   Charlott Rakes, MD  Multiple Vitamin (MULTIVITAMIN) tablet Take 1 tablet by mouth daily.    [provider]  nitroGLYCERIN (NITROSTAT) 0.4 MG SL tablet Place 1 tablet (0.4 mg total) under the tongue every 5 (five) minutes as needed for chest pain. 02/12/19   Croitoru, Mihai, MD  potassium chloride SA (K-DUR,KLOR-CON) 10 MEQ tablet Take 1 tablet (10 mEq total) by mouth daily. 01/21/18   Charlott Rakes, MD  rosuvastatin (CRESTOR) 40 MG tablet Take 1 tablet (40 mg total) by mouth daily. Patient taking differently: Take 40 mg by mouth at bedtime.  01/21/18 01/17/19  Charlott Rakes, MD  senna (SENOKOT) 8.6 MG TABS tablet Take 2 tablets by mouth 2 (two) times a day.    [provider]  spironolactone (ALDACTONE) 50 MG tablet Take 1 tablet (50 mg total) by mouth daily. 01/21/18   Charlott Rakes, MD  TRUEPLUS LANCETS 28G MISC 28 g by  Does not apply route 4 (four) times daily. 10/16/17   Brayton Caves, PA-C    Family History Family History  Problem Relation Age of Onset  . Hypertension Mother   . Colon cancer Neg Hx   . Esophageal cancer Neg Hx     Social History Social History   Tobacco Use  . Smoking status: Never Smoker  . Smokeless tobacco: Never Used  Substance Use Topics  . Alcohol use: No    Frequency: Never  . Drug use: No     Allergies   Eggs or egg-derived products, Azithromycin, Cherry, and Latex   Review of Systems Review of Systems  Constitutional: Positive for activity change and fatigue. Negative for fever.  Respiratory: Positive for cough and shortness of breath.   Cardiovascular: Positive for chest pain.  Gastrointestinal: Negative for nausea and vomiting.  All other systems reviewed and are negative.    Physical Exam Updated Vital Signs BP 115/61 (BP Location: Right Arm)   Pulse 84   Temp 97.6 F (36.4 C) (Oral)   Resp (!) 22   Ht (S) 5' 4.5" (1.638 m)   Wt (S) 81.2 kg   SpO2 (S) 94%   BMI 30.25 kg/m   Physical Exam Vitals signs and nursing note reviewed.  Constitutional:      Appearance: She is well-developed.  HENT:     Head: Normocephalic and atraumatic.  Eyes:     Pupils: Pupils are equal, round, and reactive to light.  Neck:     Musculoskeletal: Neck supple.     Vascular: JVD present.  Cardiovascular:     Rate and Rhythm: Normal rate and regular rhythm.     Heart sounds: Normal heart sounds. No murmur.  Pulmonary:     Effort: Pulmonary effort is normal. Tachypnea present. No accessory muscle usage or respiratory distress.     Breath sounds: No stridor. Examination of the right-upper field reveals wheezing. Examination of the left-upper field reveals wheezing. Examination of the right-middle field reveals wheezing. Examination of the left-middle field reveals wheezing. Examination of the right-lower field reveals wheezing. Examination of the left-lower field  reveals wheezing. Wheezing present.  Abdominal:  General: There is no distension.     Palpations: Abdomen is soft.     Tenderness: There is no abdominal tenderness. There is no guarding or rebound.  Musculoskeletal:     Right lower leg: No edema.     Left lower leg: No edema.  Skin:    General: Skin is warm and dry.  Neurological:     Mental Status: She is alert and oriented to person, place, and time.      ED Treatments / Results  Labs (all labs ordered are listed, but only abnormal results are displayed) Labs Reviewed  CBC WITH DIFFERENTIAL/PLATELET - Abnormal; Notable for the following components:      Result Value   WBC 15.0 (*)    RBC 3.79 (*)    Hemoglobin 10.0 (*)    HCT 32.6 (*)    RDW 17.2 (*)    Neutro Abs 12.0 (*)    Abs Immature Granulocytes 0.14 (*)    All other components within normal limits  BASIC METABOLIC PANEL - Abnormal; Notable for the following components:   Glucose, Bld 229 (*)    BUN 63 (*)    Creatinine, Ser 3.00 (*)    GFR calc non Af Amer 16 (*)    GFR calc Af Amer 18 (*)    All other components within normal limits  CULTURE, BLOOD (ROUTINE X 2)  CULTURE, BLOOD (ROUTINE X 2)  URINE CULTURE  SARS CORONAVIRUS 2 (HOSPITAL ORDER, [facility])  BRAIN NATRIURETIC PEPTIDE  APTT  PROTIME-INR  LACTIC ACID, PLASMA  LACTIC ACID, PLASMA  URINALYSIS, ROUTINE W REFLEX MICROSCOPIC  TROPONIN I (HIGH SENSITIVITY)  TROPONIN I (HIGH SENSITIVITY)    EKG EKG Interpretation  Date/Time:  Wednesday March 04 2019 05:44:22 EDT Ventricular Rate:  85 PR Interval:    QRS Duration: 94 QT Interval:  387 QTC Calculation: 461 R Axis:   -68 Text Interpretation:  Sinus rhythm Probable left atrial enlargement Abnormal R-wave progression, late transition Inferior infarct, old Nonspecific T wave abnormality No significant change was found Confirmed by Ezequiel Essex [phone]) on 03/04/2019 6:08:37 AM    EKG Interpretation  Date/Time:   Wednesday March 04 2019 08:26:49 EDT Ventricular Rate:  85 PR Interval:    QRS Duration: 94 QT Interval:  384 QTC Calculation: 457 R Axis:   -69 Text Interpretation:  Sinus rhythm Abnormal R-wave progression, late transition Inferior infarct, old No acute changes TWI in v6 No significant change since last tracing Confirmed by Varney Biles [phone]) on 03/04/2019 8:43:34 AM        Radiology Dg Chest Portable 1 View  Result Date: 03/04/2019 CLINICAL DATA:  Shortness of breath. EXAM: PORTABLE CHEST 1 VIEW COMPARISON:  02/05/2019. FINDINGS: Surgical clips upper chest. Heart size stable. Diffuse bilateral pulmonary infiltrates/edema again noted. Interim progression from prior exam. Low lung volumes. No pleural effusion or pneumothorax. IMPRESSION: Progressive diffuse bilateral pulmonary infiltrates/edema. Low lung volumes. Electronically Signed   By: Marcello Moores  Register   On: 03/04/2019 07:46    Procedures .Critical Care Performed by: Varney Biles, MD Authorized by: Varney Biles, MD   Critical care provider statement:    Critical care time (minutes):  30   Critical care was necessary to treat or prevent imminent or life-threatening deterioration of the following conditions:  Respiratory failure   Critical care was time spent personally by me on the following activities:  Discussions with consultants, evaluation of patient's response to treatment, examination of patient, ordering and performing treatments  and interventions, ordering and review of laboratory studies, ordering and review of radiographic studies, pulse oximetry, re-evaluation of patient's condition, obtaining history from patient or surrogate and review of old charts   (including critical care time)  Medications Ordered in ED Medications  vancomycin (VANCOCIN) IVPB 1000 mg/200 mL premix (1,000 mg Intravenous New Bag/Given 03/04/19 0852)  ceFEPIme (MAXIPIME) 2 g in sodium chloride 0.9 % 100 mL IVPB (2 g Intravenous New  Bag/Given 03/04/19 0852)  doxycycline (VIBRAMYCIN) 100 mg in sodium chloride 0.9 % 250 mL IVPB (has no administration in time range)  vancomycin (VANCOCIN) IVPB 750 mg/150 ml premix (has no administration in time range)  ceFEPIme (MAXIPIME) 2 g in sodium chloride 0.9 % 100 mL IVPB (has no administration in time range)  albuterol (VENTOLIN HFA) 108 (90 Base) MCG/ACT inhaler 6 puff (6 puffs Inhalation Given 03/04/19 0853)     Initial Impression / Assessment and Plan / ED Course  I have reviewed the triage vital signs and the nursing notes.  Pertinent labs & imaging results that were available during my care of the patient were reviewed by me and considered in my medical decision making (see chart for details).        63 year old female with history of COPD, CAD, CHF and asthma comes in a chief complaint of shortness of breath. She is also having pleuritic chest pain along with productive cough.  Shortness of breath is exertional and on history she also has some orthopnea.  There is no pitting edema, patient reports however that she normally does not get pitting with her CHF.  Clinically with her symptoms and gradual progression of shortness of breath, it appears that this is CHF exacerbation.  Lab results however show a BNP of 45.  Patient is obese, BNP could be falsely negative, but with her elevated white count and a productive cough with pleuritic chest pain, we will start her on pneumonia meds.  At arrival she was hypoxic.  I have weaned her off of oxygen, but she continues to be tachypneic.  She will need admission and perhaps further discrimination of her diagnosis and optimization.  With bilateral opacity x-ray is also ordered.  Brenda Sims was evaluated in Emergency Department on 03/04/2019 for the symptoms described in the history of present illness. She was evaluated in the context of the global COVID-19 pandemic, which necessitated consideration that the patient might be at risk for  infection with the SARS-CoV-2 virus that causes COVID-19. Institutional protocols and algorithms that pertain to the evaluation of patients at risk for COVID-19 are in a state of rapid change based on information released by regulatory bodies including the CDC and federal and state organizations. These policies and algorithms were followed during the patient's care in the ED.   Final Clinical Impressions(s) / ED Diagnoses   Final diagnoses:  Acute congestive heart failure, unspecified heart failure type ([place])  Acute hypoxemic respiratory failure ([place])  Community acquired pneumonia, unspecified laterality    ED Discharge Orders    None        Varney Biles, MD 03/04/19 [phone]

## 2019-03-04 NOTE — Progress Notes (Signed)
Pharmacy Antibiotic Note  Brenda Sims is a 63 y.o. female with hx CKD, COPD, CAD, and HFpresented to the ED on 03/04/2019 with c/o CP.  To start vancomycin and cefepime for PNA.  - 7/22 CXR: Progressive diffuse bilateral pulmonary infiltrates/edema - afeb, wbc 15, scr 3 (crcl~20)    Plan: - cefepime 2 gm IV q24h - vancomycin 1000 mg IV x1 ordered in the ED, then 750 mg IV q48h (est AUC 510) - monitor renal function closely ________________________________________  Height: (S) 5' 4.5" (163.8 cm) Weight: (S) 179 lb 0.2 oz (81.2 kg) IBW/kg (Calculated) : 55.85  Temp (24hrs), Avg:97.6 F (36.4 C), Min:97.6 F (36.4 C), Max:97.6 F (36.4 C)  Recent Labs  Lab 03/04/19 0641  WBC 15.0*  CREATININE 3.00*    Estimated Creatinine Clearance: 20 mL/min (A) (by C-G formula based on SCr of 3 mg/dL (H)).    Allergies  Allergen Reactions  . Eggs Or Egg-Derived Products Shortness Of Breath and Rash  . Azithromycin Swelling    Facial/throat swelling  . Cherry Swelling    Facial swelling  . Latex Rash     Thank you for allowing pharmacy to be a part of this patient's care.  Lynelle Doctor 03/04/2019 8:36 AM

## 2019-03-04 NOTE — ED Triage Notes (Signed)
Patient coming from home with complaints of chest pain that she states feels like pressure or something swelling inside of her chest. Patient states that she always has chest pain but it got increasingly worse starting at 2 PM. Patient also endorses SOB with exertion. Patient has a history of CHF. Patient very tearful during triage, stating "I don't think I am going to live very long."

## 2019-03-04 NOTE — Progress Notes (Signed)
A consult was received from an ED physician for Vancomycin & Cefepime per pharmacy dosing.  The patient's profile has been reviewed for ht/wt/allergies/indication/available labs.    A one time order has been placed for Vancomycin 1gm, Cefepime 2gm (by ED provider), Doxycycline 100mg  IV x1 per ED.  Further antibiotics/pharmacy consults should be ordered by admitting physician if indicated.                       Thank you,  Minda Ditto 03/04/2019  7:58 AM

## 2019-03-04 NOTE — ED Notes (Addendum)
Patient sleeping on stomach and accidentally removed nasal cannula. Replaced nasal cannula at 2 L. Will continue to monitor patient.

## 2019-03-04 NOTE — ED Notes (Signed)
Patient SpO2 dropped to 88% on RA. Placed patient on 2L of O2 via [place] and SpO2 increased to 95%. Patient reports that she does not normally require oxygen at home. Will continue to monitor patient.

## 2019-03-05 DIAGNOSIS — J189 Pneumonia, unspecified organism: Principal | ICD-10-CM

## 2019-03-05 DIAGNOSIS — E119 Type 2 diabetes mellitus without complications: Secondary | ICD-10-CM

## 2019-03-05 DIAGNOSIS — Z794 Long term (current) use of insulin: Secondary | ICD-10-CM

## 2019-03-05 DIAGNOSIS — F329 Major depressive disorder, single episode, unspecified: Secondary | ICD-10-CM

## 2019-03-05 DIAGNOSIS — I10 Essential (primary) hypertension: Secondary | ICD-10-CM

## 2019-03-05 LAB — RESPIRATORY PANEL BY PCR

## 2019-03-05 LAB — RENAL FUNCTION PANEL
Albumin: 3.2 g/dL — ABNORMAL LOW (ref 3.5–5.0)
Anion gap: 10 (ref 5–15)
BUN: 62 mg/dL — ABNORMAL HIGH (ref 8–23)
CO2: 21 mmol/L — ABNORMAL LOW (ref 22–32)
Calcium: 8.9 mg/dL (ref 8.9–10.3)
Chloride: 111 mmol/L (ref 98–111)
Creatinine, Ser: 3.07 mg/dL — ABNORMAL HIGH (ref 0.44–1.00)
GFR calc Af Amer: 18 mL/min — ABNORMAL LOW (ref >60)
GFR calc non Af Amer: 15 mL/min — ABNORMAL LOW (ref >60)
Glucose, Bld: 161 mg/dL — ABNORMAL HIGH (ref 70–99)
Phosphorus: 3.6 mg/dL (ref 2.5–4.6)
Potassium: 4.8 mmol/L (ref 3.5–5.1)
Sodium: 142 mmol/L (ref 135–145)

## 2019-03-05 LAB — EXPECTORATED SPUTUM ASSESSMENT W GRAM STAIN, RFLX TO RESP C: Special Requests: NORMAL

## 2019-03-05 LAB — CBC WITH DIFFERENTIAL/PLATELET
Abs Immature Granulocytes: 0.11 10*3/uL — ABNORMAL HIGH (ref 0.00–0.07)
Basophils Absolute: 0.1 10*3/uL (ref 0.0–0.1)
Basophils Relative: 0 %
Eosinophils Absolute: 0.1 10*3/uL (ref 0.0–0.5)
Eosinophils Relative: 1 %
HCT: 29.2 % — ABNORMAL LOW (ref 36.0–46.0)
Hemoglobin: 8.7 g/dL — ABNORMAL LOW (ref 12.0–15.0)
Immature Granulocytes: 1 %
Lymphocytes Relative: 18 %
Lymphs Abs: 2.5 10*3/uL (ref 0.7–4.0)
MCH: 26.3 pg (ref 26.0–34.0)
MCHC: 29.8 g/dL — ABNORMAL LOW (ref 30.0–36.0)
MCV: 88.2 fL (ref 80.0–100.0)
Monocytes Absolute: 1.6 10*3/uL — ABNORMAL HIGH (ref 0.1–1.0)
Monocytes Relative: 12 %
Neutro Abs: 9.5 10*3/uL — ABNORMAL HIGH (ref 1.7–7.7)
Neutrophils Relative %: 68 %
Platelets: 301 10*3/uL (ref 150–400)
RBC: 3.31 MIL/uL — ABNORMAL LOW (ref 3.87–5.11)
RDW: 17.6 % — ABNORMAL HIGH (ref 11.5–15.5)
WBC: 13.9 10*3/uL — ABNORMAL HIGH (ref 4.0–10.5)
nRBC: 0 % (ref 0.0–0.2)

## 2019-03-05 LAB — URINE CULTURE: Culture: NO GROWTH

## 2019-03-05 LAB — MAGNESIUM: Magnesium: 2.7 mg/dL — ABNORMAL HIGH (ref 1.7–2.4)

## 2019-03-05 LAB — MRSA PCR SCREENING: MRSA by PCR: NEGATIVE

## 2019-03-05 LAB — GLUCOSE, CAPILLARY
Glucose-Capillary: 197 mg/dL — ABNORMAL HIGH (ref 70–99)
Glucose-Capillary: 240 mg/dL — ABNORMAL HIGH (ref 70–99)
Glucose-Capillary: 127 mg/dL — ABNORMAL HIGH (ref 70–99)
Glucose-Capillary: 154 mg/dL — ABNORMAL HIGH (ref 70–99)

## 2019-03-05 LAB — HIV ANTIBODY (ROUTINE TESTING W REFLEX): HIV Screen 4th Generation wRfx: NONREACTIVE

## 2019-03-05 MED ORDER — ALBUTEROL SULFATE (2.5 MG/3ML) 0.083% IN NEBU
2.5000 mg | INHALATION_SOLUTION | Freq: Three times a day (TID) | RESPIRATORY_TRACT | Status: DC
  Administered 2019-03-05 – 2019-03-06 (×2): 2.5 mg via RESPIRATORY_TRACT
  Filled 2019-03-05 (×2): qty 3

## 2019-03-05 MED ORDER — FUROSEMIDE 40 MG PO TABS
40.0000 mg | ORAL_TABLET | Freq: Every day | ORAL | Status: DC
  Administered 2019-03-06: 09:00:00 40 mg via ORAL
  Filled 2019-03-05: qty 1

## 2019-03-05 MED ORDER — AMOXICILLIN-POT CLAVULANATE 500-125 MG PO TABS
1.0000 | ORAL_TABLET | Freq: Two times a day (BID) | ORAL | Status: DC
  Administered 2019-03-06 – 2019-03-07 (×3): 500 mg via ORAL
  Filled 2019-03-05 (×4): qty 1

## 2019-03-05 MED ORDER — DOXYCYCLINE HYCLATE 100 MG PO TABS
100.0000 mg | ORAL_TABLET | Freq: Two times a day (BID) | ORAL | Status: DC
  Filled 2019-03-05: qty 1

## 2019-03-05 MED ORDER — ALBUTEROL SULFATE (2.5 MG/3ML) 0.083% IN NEBU
2.5000 mg | INHALATION_SOLUTION | Freq: Four times a day (QID) | RESPIRATORY_TRACT | Status: DC
  Filled 2019-03-05 (×2): qty 3

## 2019-03-05 NOTE — Progress Notes (Signed)
Pt unsure if she will wear CPAP tonight.  Pt states she will have RN contact RT if she decides to wear.  RT to monitor and assess as needed.

## 2019-03-05 NOTE — Progress Notes (Addendum)
Marland Kitchen  PROGRESS NOTE    Brenda Sims  DPO:[id number] DOB: 07/08/1956 DOA: 03/04/2019 PCP: Leonard Downing, MD   Brief Narrative:   Brenda Sims is a 63 y.o. female with medical history significant of HTN, IDDM, CKD 4, asthma, anxiety, systolic HF. Presents to ED w/o complaints of 2 days of cough and general chest pain. She reports that her symptoms began on Monday. She states that she felt "a little swollen" and found it difficult to breathe. She said she began coughing and thought it was fluid. So she decided to take "an extra water pill". It did not help. Her symptoms worsened throughout the day into the next. As she continued to cough, she developed midsteral and axial chest pain. It is worsened with cough and deep inspiration. She decided that she couldn't tolerate it any more, so she came to the ED. She denies any sick contacts, recent travel, fevers, N/V. Other than taking an extra diuretic Monday, she denies any changes in medication.    Assessment & Plan:   Active Problems:   Essential hypertension   CKD (chronic kidney disease) stage 4, GFR 15-29 ml/min (HCC)   IDDM (insulin dependent diabetes mellitus) (HCC)   Anxiety and depression   Community acquired pneumonia   Asthma, chronic, unspecified asthma severity, with acute exacerbation   Pneumonia   Bilateral PNA; likely CAP     - COVID 19 negative     - vanc, cefepime, doxy in ED; can continue for right now; will likely de-escalate to rocephin/doxy as she stays stable     - wean O2     - Albuterol PRN     - deescalte to augmentin  CKD 4     - watch nephrotoxins     - baseline SCr is 3     - continue home spironolactone, lasix  IDDM     - lantus 20units BID     - SSI  Asthma     - albuterol  HTN     - BP soft right now, monitor, resume home regimen as BP improve  Anxiety/Depression     - continue prozac, buspar  HLD     - continue home crestor  Iron deficiency anemia     - no evidence of frank  bleed     - continue iron supplement  CAD     - continue ASA, crestor  Chronic systolic HF     - BNP normal     - continue lasix, spironolactone     - resume coreg, lostartan as BP tolerates  Continue CPAP at night.  DVT prophylaxis: Heparin Code Status: FULL   Disposition Plan: Likely to home in AM   Antimicrobials:  augmentin  Subjective: "You know I do go out anything. I'm staying away from people."  Objective: Vitals:   03/04/19 2057 03/04/19 2232 03/05/19 0152 03/05/19 0530  BP: (!) 117/57   113/67  Pulse: 84 87  73  Resp: 15 16  18   Temp: 98.7 F (37.1 C)   98.2 F (36.8 C)  TempSrc: Oral   Axillary  SpO2: 98% 100% 99% 100%  Weight:      Height:   5\' 4"  (1.626 m)     Intake/Output Summary (Last 24 hours) at 03/05/2019 1229 Last data filed at 03/05/2019 0600 Gross per 24 hour  Intake 940 ml  Output 0 ml  Net 940 ml   Filed Weights   03/04/19 0551 03/04/19 0757  Weight: 81.2  kg (S) 81.2 kg    Examination:  General: 63 y.o. female resting in bed in NAD Cardiovascular: RRR, +S1, S2, no m/g/r, equal pulses throughout Respiratory: minor rhonchi at bases, normal WOB GI: BS+, NDNT, no masses noted, no organomegaly noted MSK: No e/c/c Skin: No rashes, bruises, ulcerations noted Neuro: A&O x 3, no focal deficits Psyc: Appropriate interaction and affect, calm/cooperative   Data Reviewed: I have personally reviewed following labs and imaging studies.  CBC: Recent Labs  Lab 03/04/19 0641 03/04/19 2130 03/05/19 0328  WBC 15.0* 14.1* 13.9*  NEUTROABS 12.0*  --  9.5*  HGB 10.0* 8.9* 8.7*  HCT 32.6* 29.0* 29.2*  MCV 86.0 87.1 88.2  PLT 342 322 196   Basic Metabolic Panel: Recent Labs  Lab 03/04/19 0641 03/04/19 2130 03/05/19 0328  NA 137  --  142  K 4.4  --  4.8  CL 104  --  111  CO2 23  --  21*  GLUCOSE 229*  --  161*  BUN 63*  --  62*  CREATININE 3.00* 3.09* 3.07*  CALCIUM 9.2  --  8.9  MG  --   --  2.7*  PHOS  --   --  3.6   GFR:  Estimated Creatinine Clearance: 19.3 mL/min (A) (by C-G formula based on SCr of 3.07 mg/dL (H)). Liver Function Tests: Recent Labs  Lab 03/05/19 0328  ALBUMIN 3.2*   No results for input(s): LIPASE, AMYLASE in the last 168 hours. No results for input(s): AMMONIA in the last 168 hours. Coagulation Profile: Recent Labs  Lab 03/04/19 0751  INR 1.1   Cardiac Enzymes: No results for input(s): CKTOTAL, CKMB, CKMBINDEX, TROPONINI in the last 168 hours. BNP (last 3 results) No results for input(s): PROBNP in the last 8760 hours. HbA1C: No results for input(s): HGBA1C in the last 72 hours. CBG: Recent Labs  Lab 03/04/19 1620 03/04/19 1905 03/04/19 2122 03/05/19 0744 03/05/19 1053  GLUCAP 215* 309* 262* 197* 240*   Lipid Profile: No results for input(s): CHOL, HDL, LDLCALC, TRIG, CHOLHDL, LDLDIRECT in the last 72 hours. Thyroid Function Tests: No results for input(s): TSH, T4TOTAL, FREET4, T3FREE, THYROIDAB in the last 72 hours. Anemia Panel: No results for input(s): VITAMINB12, FOLATE, FERRITIN, TIBC, IRON, RETICCTPCT in the last 72 hours. Sepsis Labs: Recent Labs  Lab 03/04/19 0751  LATICACIDVEN 1.1    Recent Results (from the past 240 hour(s))  Blood Culture (routine x 2)     Status: None (Preliminary result)   Collection Time: 03/04/19  7:51 AM   Specimen: BLOOD LEFT FOREARM  Result Value Ref Range Status   Specimen Description   Final    BLOOD LEFT FOREARM Performed at [facility] [address], [place], [place] [address]    Special Requests   Final    BOTTLES DRAWN AEROBIC AND ANAEROBIC Blood Culture adequate volume Performed at [facility] [address], [place], [place] [address]    Culture   Final    NO GROWTH < 24 HOURS Performed at [facility] [address] [place], [place] [address]    Report Status PENDING  Incomplete  SARS Coronavirus 2 (CEPHEID- Performed in [facility] hospital lab), Hosp Order      Status: None   Collection Time: 03/04/19  7:52 AM   Specimen: Nasopharyngeal Swab  Result Value Ref Range Status   SARS Coronavirus 2 NEGATIVE NEGATIVE Final    Comment: (NOTE) If result is NEGATIVE SARS-CoV-2 target  nucleic acids are NOT DETECTED. The SARS-CoV-2 RNA is generally detectable in upper and lower  respiratory specimens during the acute phase of infection. The lowest  concentration of SARS-CoV-2 viral copies this assay can detect is 250  copies / mL. A negative result does not preclude SARS-CoV-2 infection  and should not be used as the sole basis for treatment or other  patient management decisions.  A negative result may occur with  improper specimen collection / handling, submission of specimen other  than nasopharyngeal swab, presence of viral mutation(s) within the  areas targeted by this assay, and inadequate number of viral copies  (<250 copies / mL). A negative result must be combined with clinical  observations, patient history, and epidemiological information. If result is POSITIVE SARS-CoV-2 target nucleic acids are DETECTED. The SARS-CoV-2 RNA is generally detectable in upper and lower  respiratory specimens dur ing the acute phase of infection.  Positive  results are indicative of active infection with SARS-CoV-2.  Clinical  correlation with patient history and other diagnostic information is  necessary to determine patient infection status.  Positive results do  not rule out bacterial infection or co-infection with other viruses. If result is PRESUMPTIVE POSTIVE SARS-CoV-2 nucleic acids MAY BE PRESENT.   A presumptive positive result was obtained on the submitted specimen  and confirmed on repeat testing.  While 2019 novel coronavirus  (SARS-CoV-2) nucleic acids may be present in the submitted sample  additional confirmatory testing may be necessary for epidemiological  and / or clinical management purposes  to differentiate between  SARS-CoV-2 and  other Sarbecovirus currently known to infect humans.  If clinically indicated additional testing with an alternate test  methodology [phone]) is advised. The SARS-CoV-2 RNA is generally  detectable in upper and lower respiratory sp ecimens during the acute  phase of infection. The expected result is Negative. Fact Sheet for Patients:  StrictlyIdeas.no Fact Sheet for Healthcare Providers: BankingDealers.co.za This test is not yet approved or cleared by the [place] FDA and has been authorized for detection and/or diagnosis of SARS-CoV-2 by FDA under an Emergency Use Authorization (EUA).  This EUA will remain in effect (meaning this test can be used) for the duration of the COVID-19 declaration under Section 564(b)(1) of the Act, 21 U.S.C. section 360bbb-3(b)(1), unless the authorization is terminated or revoked sooner. Performed at [facility], [place] [address], [place], [place] [address]   Blood Culture (routine x 2)     Status: None (Preliminary result)   Collection Time: 03/04/19  7:56 AM   Specimen: BLOOD  Result Value Ref Range Status   Specimen Description   Final    BLOOD LEFT ANTECUBITAL Performed at [facility], [place] [address], [place], [place] [address]    Special Requests   Final    BOTTLES DRAWN AEROBIC AND ANAEROBIC Blood Culture adequate volume Performed at [facility] [address], [place], [place] [address]    Culture   Final    NO GROWTH < 24 HOURS Performed at [facility] [address], [place], [place] [address]    Report Status PENDING  Incomplete  Urine culture     Status: None   Collection Time: 03/04/19  8:20 AM   Specimen: In/Out Cath Urine  Result Value Ref Range Status   Specimen Description   Final    IN/OUT CATH URINE Performed at [facility] [address] [place], [place] [address]    Special  Requests   Final     NONE Performed at [facility], [place] [address], [place], [place] [address]    Culture   Final    NO GROWTH Performed at [facility], [place] [address], [place], [place] [address]    Report Status 03/05/2019 FINAL  Final  MRSA PCR Screening     Status: None   Collection Time: 03/05/19  5:44 AM   Specimen: Nasal Mucosa; Nasopharyngeal  Result Value Ref Range Status   MRSA by PCR NEGATIVE NEGATIVE Final    Comment:        The GeneXpert MRSA Assay (FDA approved for NASAL specimens only), is one component of a comprehensive MRSA colonization surveillance program. It is not intended to diagnose MRSA infection nor to guide or monitor treatment for MRSA infections. Performed at [facility], [place] [address], [place], [place] [address]   Sputum culture     Status: None   Collection Time: 03/05/19  5:44 AM   Specimen: Sputum  Result Value Ref Range Status   Specimen Description SPUTUM  Final   Special Requests Normal  Final   Sputum evaluation   Final    THIS SPECIMEN IS ACCEPTABLE FOR SPUTUM CULTURE Performed at [facility], [place] [address], [place], [place] [address]    Report Status 03/05/2019 FINAL  Final  Culture, respiratory     Status: None (Preliminary result)   Collection Time: 03/05/19  5:44 AM   Specimen: SPU  Result Value Ref Range Status   Specimen Description   Final    SPUTUM Performed at [facility] [address] [place], [place] [address]    Special Requests   Final    Normal Reflexed from [phone] Performed at [facility], [place] [address], [place], [place] [address]    Gram Stain   Final    MODERATE WBC PRESENT, PREDOMINANTLY PMN MODERATE SQUAMOUS EPITHELIAL CELLS PRESENT MODERATE GRAM NEGATIVE COCCI IN PAIRS FEW GRAM POSITIVE RODS FEW GRAM NEGATIVE RODS FEW GRAM POSITIVE COCCI IN CHAINS IN PAIRS RARE BUDDING YEAST SEEN Performed at [facility], [place] [address] [place],  [address]    Culture PENDING  Incomplete   Report Status PENDING  Incomplete  Respiratory Panel by PCR     Status: None   Collection Time: 03/05/19  6:05 AM  Result Value Ref Range Status   Adenovirus NOT DETECTED NOT DETECTED Final   Coronavirus 229E NOT DETECTED NOT DETECTED Final    Comment: (NOTE) The Coronavirus on the Respiratory Panel, DOES NOT test for the novel  Coronavirus (2019 nCoV)    Coronavirus HKU1 NOT DETECTED NOT DETECTED Final   Coronavirus NL63 NOT DETECTED NOT DETECTED Final   Coronavirus OC43 NOT DETECTED NOT DETECTED Final   Metapneumovirus NOT DETECTED NOT DETECTED Final   Rhinovirus / Enterovirus NOT DETECTED NOT DETECTED Final   Influenza A NOT DETECTED NOT DETECTED Final   Influenza B NOT DETECTED NOT DETECTED Final   Parainfluenza Virus 1 NOT DETECTED NOT DETECTED Final   Parainfluenza Virus 2 NOT DETECTED NOT DETECTED Final   Parainfluenza Virus 3 NOT DETECTED NOT DETECTED Final   Parainfluenza Virus 4 NOT DETECTED NOT DETECTED Final   Respiratory Syncytial Virus NOT DETECTED NOT DETECTED Final   Bordetella pertussis NOT DETECTED NOT DETECTED Final   Chlamydophila pneumoniae NOT DETECTED NOT DETECTED Final   Mycoplasma pneumoniae NOT DETECTED NOT DETECTED Final    Comment:  Performed at [facility], [place] [address], [place], [place] [address]         Radiology Studies: Dg Chest Portable 1 View  Result Date: 03/04/2019 CLINICAL DATA:  Shortness of breath. EXAM: PORTABLE CHEST 1 VIEW COMPARISON:  02/05/2019. FINDINGS: Surgical clips upper chest. Heart size stable. Diffuse bilateral pulmonary infiltrates/edema again noted. Interim progression from prior exam. Low lung volumes. No pleural effusion or pneumothorax. IMPRESSION: Progressive diffuse bilateral pulmonary infiltrates/edema. Low lung volumes. Electronically Signed   By: Marcello Moores  Register   On: 03/04/2019 07:46        Scheduled Meds: . albuterol  2.5  mg Nebulization Q6H  . [START ON 03/06/2019] amoxicillin-clavulanate  1 tablet Oral BID  . aspirin EC  81 mg Oral Daily  . busPIRone  5 mg Oral TID  . doxycycline  100 mg Oral BID  . ferrous sulfate  325 mg Oral TID WC  . FLUoxetine  20 mg Oral Daily  . furosemide  40 mg Oral BID  . gabapentin  400 mg Oral BID  . heparin  5,000 Units Subcutaneous Q8H  . insulin aspart  0-15 Units Subcutaneous TID WC  . insulin aspart  0-5 Units Subcutaneous QHS  . insulin glargine  20 Units Subcutaneous BID  . loratadine  10 mg Oral Daily  . montelukast  10 mg Oral QHS  . multivitamin with minerals  1 tablet Oral Daily  . potassium chloride  10 mEq Oral Daily  . rosuvastatin  40 mg Oral QHS  . senna  2 tablet Oral BID  . spironolactone  50 mg Oral Daily   Continuous Infusions:   LOS: 1 day    Time spent: 25 minutes spent in the coordination of care today.    Jonnie Finner, DO [facility] Pager [phone]  If 7PM-7AM, please contact night-coverage [email or web address] Password [email or web address] 03/05/2019, 12:29 PM

## 2019-03-06 LAB — RENAL FUNCTION PANEL
Albumin: 3.7 g/dL (ref 3.5–5.0)
Anion gap: 11 (ref 5–15)
BUN: 60 mg/dL — ABNORMAL HIGH (ref 8–23)
CO2: 22 mmol/L (ref 22–32)
Calcium: 9.6 mg/dL (ref 8.9–10.3)
Chloride: 105 mmol/L (ref 98–111)
Creatinine, Ser: 2.73 mg/dL — ABNORMAL HIGH (ref 0.44–1.00)
GFR calc Af Amer: 21 mL/min — ABNORMAL LOW (ref >60)
GFR calc non Af Amer: 18 mL/min — ABNORMAL LOW (ref >60)
Glucose, Bld: 207 mg/dL — ABNORMAL HIGH (ref 70–99)
Phosphorus: 3.6 mg/dL (ref 2.5–4.6)
Potassium: 4.6 mmol/L (ref 3.5–5.1)
Sodium: 138 mmol/L (ref 135–145)

## 2019-03-06 LAB — CBC
HCT: 31.9 % — ABNORMAL LOW (ref 36.0–46.0)
Hemoglobin: 9.6 g/dL — ABNORMAL LOW (ref 12.0–15.0)
MCH: 26.5 pg (ref 26.0–34.0)
MCHC: 30.1 g/dL (ref 30.0–36.0)
MCV: 88.1 fL (ref 80.0–100.0)
Platelets: 322 10*3/uL (ref 150–400)
RBC: 3.62 MIL/uL — ABNORMAL LOW (ref 3.87–5.11)
RDW: 17.6 % — ABNORMAL HIGH (ref 11.5–15.5)
WBC: 10.8 10*3/uL — ABNORMAL HIGH (ref 4.0–10.5)
nRBC: 0 % (ref 0.0–0.2)

## 2019-03-06 LAB — GLUCOSE, CAPILLARY
Glucose-Capillary: 115 mg/dL — ABNORMAL HIGH (ref 70–99)
Glucose-Capillary: 226 mg/dL — ABNORMAL HIGH (ref 70–99)
Glucose-Capillary: 124 mg/dL — ABNORMAL HIGH (ref 70–99)
Glucose-Capillary: 178 mg/dL — ABNORMAL HIGH (ref 70–99)

## 2019-03-06 MED ORDER — FUROSEMIDE 40 MG PO TABS
40.0000 mg | ORAL_TABLET | Freq: Two times a day (BID) | ORAL | Status: DC
  Administered 2019-03-06 – 2019-03-07 (×2): 40 mg via ORAL
  Filled 2019-03-06 (×2): qty 1

## 2019-03-06 MED ORDER — CARVEDILOL 6.25 MG PO TABS
6.2500 mg | ORAL_TABLET | Freq: Two times a day (BID) | ORAL | Status: DC
  Administered 2019-03-06 – 2019-03-07 (×2): 6.25 mg via ORAL
  Filled 2019-03-06 (×2): qty 1

## 2019-03-06 MED ORDER — ACETAMINOPHEN 325 MG PO TABS
650.0000 mg | ORAL_TABLET | Freq: Four times a day (QID) | ORAL | Status: DC | PRN
  Administered 2019-03-06: 650 mg via ORAL
  Filled 2019-03-06: qty 2

## 2019-03-06 MED ORDER — ALBUTEROL SULFATE (2.5 MG/3ML) 0.083% IN NEBU
2.5000 mg | INHALATION_SOLUTION | Freq: Two times a day (BID) | RESPIRATORY_TRACT | Status: DC
  Administered 2019-03-06 – 2019-03-07 (×2): 2.5 mg via RESPIRATORY_TRACT
  Filled 2019-03-06 (×2): qty 3

## 2019-03-06 MED ORDER — CYCLOBENZAPRINE HCL 5 MG PO TABS
5.0000 mg | ORAL_TABLET | Freq: Three times a day (TID) | ORAL | Status: DC | PRN
  Administered 2019-03-06: 5 mg via ORAL
  Filled 2019-03-06: qty 1

## 2019-03-06 MED ORDER — CARVEDILOL 25 MG PO TABS
25.0000 mg | ORAL_TABLET | Freq: Two times a day (BID) | ORAL | Status: DC
  Administered 2019-03-06: 25 mg via ORAL
  Filled 2019-03-06: qty 1

## 2019-03-06 NOTE — Progress Notes (Signed)
Brenda Sims  PROGRESS NOTE    Brenda Sims  LEX:[id number] DOB: Mar 30, 1956 DOA: 03/04/2019 PCP: Leonard Downing, MD   Brief Narrative:   Brenda L Rufusis a 63 y.o.femalewith medical history significant ofHTN, IDDM, CKD 4, asthma, anxiety, systolic HF. Presents to ED w/o complaints of 2 days of cough and general chest pain. She reports that her symptoms began on Monday. She states that she felt "a little swollen" and found it difficult to breathe. She said she began coughing and thought it was fluid. So she decided to take "an extra water pill". It did not help. Her symptoms worsened throughout the day into the next. As she continued to cough, she developed midsteral and axial chest pain. It is worsened with cough and deep inspiration. She decided that she couldn't tolerate it any more, so she came to the ED. She denies any sick contacts, recent travel, fevers, N/V. Other than taking an extra diuretic Monday, she denies any changes in medication.   Assessment & Plan:   Active Problems:   Essential hypertension   CKD (chronic kidney disease) stage 4, GFR 15-29 ml/min (HCC)   IDDM (insulin dependent diabetes mellitus) (HCC)   Anxiety and depression   Community acquired pneumonia   Asthma, chronic, unspecified asthma severity, with acute exacerbation   Pneumonia   Bilateral PNA; likely CAP - COVID 19 negative - vanc, cefepime, doxy in ED; can continue for right now; will likely de-escalate to rocephin/doxy as she stays stable - wean O2 - Albuterol PRN     - deescalte to augmentin     - continue augmentin through 03/08/2019 PM  CKD 4 - watch nephrotoxins - baseline SCr is 3 - continue home spironolactone, lasix (40 BID)  IDDM - lantus 20units BID - SSI  Asthma - albuterol  HTN - BP soft right now, monitor, resume home regimen as BP improve     - coreg resumed at reduced amount; hold losartan and hydralazine  Anxiety/Depression  - continue prozac, buspar  HLD - continue home crestor  Iron deficiency anemia - no evidence of frank bleed - continue iron supplement  CAD - continue ASA, crestor  Chronic systolic HF - BNP normal - continue lasix, spironolactone - resume coreg, lostartan as BP tolerates     - coreg resumed at reduced amount, hold losartan  BP looked as if it could support home dose of coreg. Resumed, but will need to decrease as we are increasing her lasix closer to home dosing. CXR with some edema -- hence increasing her lasix closer to home dose. Would recommend holding losartan and hydralazine at discharge. To resume with follow up with PCP. Looking for d/c in AM.    DVT prophylaxis: Heparin Code Status: FULL   Disposition Plan: Likely to home in AM   Antimicrobials:  . Augmentin    Subjective: "I don't think I'm doing good."  Objective: Vitals:   03/05/19 2102 03/06/19 0546 03/06/19 0825 03/06/19 1319  BP: (!) 115/49 (!) 134/55  (!) 118/49  Pulse: 79 77  68  Resp: 16 20  16   Temp: 98.1 F (36.7 C) 98 F (36.7 C)  98.8 F (37.1 C)  TempSrc: Oral   Oral  SpO2: 100% 99% 97% 100%  Weight:      Height:        Intake/Output Summary (Last 24 hours) at 03/06/2019 1414 Last data filed at 03/06/2019 1145 Gross per 24 hour  Intake 830 ml  Output -  Net 830 ml  Filed Weights   2019-03-10 0551 03/10/19 0757  Weight: 81.2 kg (S) 81.2 kg    Examination:  General: 63 y.o. female resting in bed in NAD Cardiovascular: RRR, +S1, S2, no m/g/r, equal pulses throughout Respiratory: minor rhonchi at bases, some wheeze and cough normal WOB GI: BS+, NDNT, no masses noted, no organomegaly noted MSK: No e/c/c Skin: No rashes, bruises, ulcerations noted Neuro: A&O x 3, no focal deficits Psyc: Appropriate interaction and affect, calm/cooperative    Data Reviewed: I have personally reviewed following labs and imaging studies.  CBC: Recent Labs   Lab 03/10/19 0641 2019-03-10 2130 03/05/19 0328 03/06/19 0904  WBC 15.0* 14.1* 13.9* 10.8*  NEUTROABS 12.0*  --  9.5*  --   HGB 10.0* 8.9* 8.7* 9.6*  HCT 32.6* 29.0* 29.2* 31.9*  MCV 86.0 87.1 88.2 88.1  PLT 342 322 301 025   Basic Metabolic Panel: Recent Labs  Lab 03/10/2019 0641 March 10, 2019 2130 03/05/19 0328 03/06/19 0904  NA 137  --  142 138  K 4.4  --  4.8 4.6  CL 104  --  111 105  CO2 23  --  21* 22  GLUCOSE 229*  --  161* 207*  BUN 63*  --  62* 60*  CREATININE 3.00* 3.09* 3.07* 2.73*  CALCIUM 9.2  --  8.9 9.6  MG  --   --  2.7*  --   PHOS  --   --  3.6 3.6   GFR: Estimated Creatinine Clearance: 21.7 mL/min (A) (by C-G formula based on SCr of 2.73 mg/dL (H)). Liver Function Tests: Recent Labs  Lab 03/05/19 0328 03/06/19 0904  ALBUMIN 3.2* 3.7   No results for input(s): LIPASE, AMYLASE in the last 168 hours. No results for input(s): AMMONIA in the last 168 hours. Coagulation Profile: Recent Labs  Lab 03/10/2019 0751  INR 1.1   Cardiac Enzymes: No results for input(s): CKTOTAL, CKMB, CKMBINDEX, TROPONINI in the last 168 hours. BNP (last 3 results) No results for input(s): PROBNP in the last 8760 hours. HbA1C: No results for input(s): HGBA1C in the last 72 hours. CBG: Recent Labs  Lab 03/05/19 1053 03/05/19 1732 03/05/19 2130 03/06/19 0734 03/06/19 1212  GLUCAP 240* 127* 154* 115* 226*   Lipid Profile: No results for input(s): CHOL, HDL, LDLCALC, TRIG, CHOLHDL, LDLDIRECT in the last 72 hours. Thyroid Function Tests: No results for input(s): TSH, T4TOTAL, FREET4, T3FREE, THYROIDAB in the last 72 hours. Anemia Panel: No results for input(s): VITAMINB12, FOLATE, FERRITIN, TIBC, IRON, RETICCTPCT in the last 72 hours. Sepsis Labs: Recent Labs  Lab 03-10-19 0751  LATICACIDVEN 1.1    Recent Results (from the past 240 hour(s))  Blood Culture (routine x 2)     Status: None (Preliminary result)   Collection Time: March 10, 2019  7:51 AM   Specimen: BLOOD LEFT  FOREARM  Result Value Ref Range Status   Specimen Description   Final    BLOOD LEFT FOREARM Performed at [facility] [address], [place], [place] [address]    Special Requests   Final    BOTTLES DRAWN AEROBIC AND ANAEROBIC Blood Culture adequate volume Performed at [facility] [address] [place], [place] [address]    Culture   Final    NO GROWTH 2 DAYS Performed at [facility] [address], [place], [place] [address]    Report Status PENDING  Incomplete  SARS Coronavirus 2 (CEPHEID- Performed in [facility] hospital lab), [facility]  Status: None   Collection Time: 03/04/19  7:52 AM   Specimen: Nasopharyngeal Swab  Result Value Ref Range Status   SARS Coronavirus 2 NEGATIVE NEGATIVE Final    Comment: (NOTE) If result is NEGATIVE SARS-CoV-2 target nucleic acids are NOT DETECTED. The SARS-CoV-2 RNA is generally detectable in upper and lower  respiratory specimens during the acute phase of infection. The lowest  concentration of SARS-CoV-2 viral copies this assay can detect is 250  copies / mL. A negative result does not preclude SARS-CoV-2 infection  and should not be used as the sole basis for treatment or other  patient management decisions.  A negative result may occur with  improper specimen collection / handling, submission of specimen other  than nasopharyngeal swab, presence of viral mutation(s) within the  areas targeted by this assay, and inadequate number of viral copies  (<250 copies / mL). A negative result must be combined with clinical  observations, patient history, and epidemiological information. If result is POSITIVE SARS-CoV-2 target nucleic acids are DETECTED. The SARS-CoV-2 RNA is generally detectable in upper and lower  respiratory specimens dur ing the acute phase of infection.  Positive  results are indicative of active infection with SARS-CoV-2.  Clinical  correlation with patient history  and other diagnostic information is  necessary to determine patient infection status.  Positive results do  not rule out bacterial infection or co-infection with other viruses. If result is PRESUMPTIVE POSTIVE SARS-CoV-2 nucleic acids MAY BE PRESENT.   A presumptive positive result was obtained on the submitted specimen  and confirmed on repeat testing.  While 2019 novel coronavirus  (SARS-CoV-2) nucleic acids may be present in the submitted sample  additional confirmatory testing may be necessary for epidemiological  and / or clinical management purposes  to differentiate between  SARS-CoV-2 and other Sarbecovirus currently known to infect humans.  If clinically indicated additional testing with an alternate test  methodology [phone]) is advised. The SARS-CoV-2 RNA is generally  detectable in upper and lower respiratory sp ecimens during the acute  phase of infection. The expected result is Negative. Fact Sheet for Patients:  StrictlyIdeas.no Fact Sheet for Healthcare Providers: BankingDealers.co.za This test is not yet approved or cleared by the [place] FDA and has been authorized for detection and/or diagnosis of SARS-CoV-2 by FDA under an Emergency Use Authorization (EUA).  This EUA will remain in effect (meaning this test can be used) for the duration of the COVID-19 declaration under Section 564(b)(1) of the Act, 21 U.S.C. section 360bbb-3(b)(1), unless the authorization is terminated or revoked sooner. Performed at [facility], [place] [address], [place], [place] [address]   Blood Culture (routine x 2)     Status: None (Preliminary result)   Collection Time: 03/04/19  7:56 AM   Specimen: BLOOD  Result Value Ref Range Status   Specimen Description   Final    BLOOD LEFT ANTECUBITAL Performed at [facility], [place] [address], [place], [place] [address]    Special Requests   Final    BOTTLES DRAWN  AEROBIC AND ANAEROBIC Blood Culture adequate volume Performed at [facility] [address] [place], [place] [address]    Culture   Final    NO GROWTH 2 DAYS Performed at [facility] [address], [place], [place] [address]    Report Status PENDING  Incomplete  Urine culture     Status: None   Collection Time: 03/04/19  8:20 AM  Specimen: In/Out Cath Urine  Result Value Ref Range Status   Specimen Description   Final    IN/OUT CATH URINE Performed at [facility] [address] [place], [place] [address]    Special Requests   Final    NONE Performed at [facility], [place] [address], [place], [place] [address]    Culture   Final    NO GROWTH Performed at [facility], [place] [address], [place], [place] [address]    Report Status 03/05/2019 FINAL  Final  MRSA PCR Screening     Status: None   Collection Time: 03/05/19  5:44 AM   Specimen: Nasal Mucosa; Nasopharyngeal  Result Value Ref Range Status   MRSA by PCR NEGATIVE NEGATIVE Final    Comment:        The GeneXpert MRSA Assay (FDA approved for NASAL specimens only), is one component of a comprehensive MRSA colonization surveillance program. It is not intended to diagnose MRSA infection nor to guide or monitor treatment for MRSA infections. Performed at [facility], [place] [address] [place], [place] [address]   Sputum culture     Status: None   Collection Time: 03/05/19  5:44 AM   Specimen: Sputum  Result Value Ref Range Status   Specimen Description SPUTUM  Final   Special Requests Normal  Final   Sputum evaluation   Final    THIS SPECIMEN IS ACCEPTABLE FOR SPUTUM CULTURE Performed at [facility], [place] [address], [place], [place] [address]    Report Status 03/05/2019 FINAL  Final  Culture, respiratory     Status: None (Preliminary result)   Collection Time: 03/05/19  5:44 AM   Specimen: SPU  Result  Value Ref Range Status   Specimen Description   Final    SPUTUM Performed at [facility] [address] [place], [place] [address]    Special Requests   Final    Normal Reflexed from [phone] Performed at [facility], [place] [address], [place], [place] [address]    Gram Stain   Final    MODERATE WBC PRESENT, PREDOMINANTLY PMN MODERATE SQUAMOUS EPITHELIAL CELLS PRESENT MODERATE GRAM NEGATIVE COCCI IN PAIRS FEW GRAM POSITIVE RODS FEW GRAM NEGATIVE RODS FEW GRAM POSITIVE COCCI IN CHAINS IN PAIRS RARE BUDDING YEAST SEEN    Culture   Final    CULTURE REINCUBATED FOR BETTER GROWTH Performed at [facility], [place] [address] [place], [place] [address]    Report Status PENDING  Incomplete  Respiratory Panel by PCR     Status: None   Collection Time: 03/05/19  6:05 AM  Result Value Ref Range Status   Adenovirus NOT DETECTED NOT DETECTED Final   Coronavirus 229E NOT DETECTED NOT DETECTED Final    Comment: (NOTE) The Coronavirus on the Respiratory Panel, DOES NOT test for the novel  Coronavirus (2019 nCoV)    Coronavirus HKU1 NOT DETECTED NOT DETECTED Final   Coronavirus NL63 NOT DETECTED NOT DETECTED Final   Coronavirus OC43 NOT DETECTED NOT DETECTED Final   Metapneumovirus NOT DETECTED NOT DETECTED Final   Rhinovirus / Enterovirus NOT DETECTED NOT DETECTED Final   Influenza A NOT DETECTED NOT DETECTED Final   Influenza B NOT DETECTED NOT DETECTED Final   Parainfluenza Virus 1 NOT DETECTED NOT DETECTED Final   Parainfluenza Virus 2 NOT DETECTED NOT DETECTED Final   Parainfluenza Virus 3 NOT DETECTED NOT DETECTED Final   Parainfluenza  Virus 4 NOT DETECTED NOT DETECTED Final   Respiratory Syncytial Virus NOT DETECTED NOT DETECTED Final   Bordetella pertussis NOT DETECTED NOT DETECTED Final   Chlamydophila pneumoniae NOT DETECTED NOT DETECTED Final   Mycoplasma pneumoniae NOT DETECTED NOT DETECTED Final    Comment: Performed at [facility], [place] [address] [place], [place] [address]         Radiology Studies: No results found.      Scheduled Meds: . albuterol  2.5 mg Nebulization BID  . amoxicillin-clavulanate  1 tablet Oral BID  . aspirin EC  81 mg Oral Daily  . busPIRone  5 mg Oral TID  . carvedilol  6.25 mg Oral BID WC  . ferrous sulfate  325 mg Oral TID WC  . FLUoxetine  20 mg Oral Daily  . furosemide  40 mg Oral BID  . gabapentin  400 mg Oral BID  . heparin  5,000 Units Subcutaneous Q8H  . insulin aspart  0-15 Units Subcutaneous TID WC  . insulin aspart  0-5 Units Subcutaneous QHS  . insulin glargine  20 Units Subcutaneous BID  . loratadine  10 mg Oral Daily  . montelukast  10 mg Oral QHS  . multivitamin with minerals  1 tablet Oral Daily  . potassium chloride  10 mEq Oral Daily  . rosuvastatin  40 mg Oral QHS  . senna  2 tablet Oral BID  . spironolactone  50 mg Oral Daily   Continuous Infusions:   LOS: 2 days    Time spent: 35 minutes spent in the coordination of care today.    Jonnie Finner, DO [facility] Pager [phone]  If 7PM-7AM, please contact night-coverage [email or web address] Password [facility] 03/06/2019, 2:14 PM

## 2019-03-06 NOTE — Plan of Care (Signed)

## 2019-03-06 NOTE — Progress Notes (Signed)
Pt states she will call if/when she is ready for her CPAP.

## 2019-03-07 LAB — CBC WITH DIFFERENTIAL/PLATELET
Abs Immature Granulocytes: 0.18 10*3/uL — ABNORMAL HIGH (ref 0.00–0.07)
Basophils Absolute: 0.1 10*3/uL (ref 0.0–0.1)
Basophils Relative: 1 %
Eosinophils Absolute: 0.4 10*3/uL (ref 0.0–0.5)
Eosinophils Relative: 3 %
HCT: 32.4 % — ABNORMAL LOW (ref 36.0–46.0)
Hemoglobin: 9.6 g/dL — ABNORMAL LOW (ref 12.0–15.0)
Immature Granulocytes: 2 %
Lymphocytes Relative: 23 %
Lymphs Abs: 2.8 10*3/uL (ref 0.7–4.0)
MCH: 25.8 pg — ABNORMAL LOW (ref 26.0–34.0)
MCHC: 29.6 g/dL — ABNORMAL LOW (ref 30.0–36.0)
MCV: 87.1 fL (ref 80.0–100.0)
Monocytes Absolute: 1.6 10*3/uL — ABNORMAL HIGH (ref 0.1–1.0)
Monocytes Relative: 13 %
Neutro Abs: 7.1 10*3/uL (ref 1.7–7.7)
Neutrophils Relative %: 58 %
Platelets: 324 10*3/uL (ref 150–400)
RBC: 3.72 MIL/uL — ABNORMAL LOW (ref 3.87–5.11)
RDW: 17.4 % — ABNORMAL HIGH (ref 11.5–15.5)
WBC: 12.1 10*3/uL — ABNORMAL HIGH (ref 4.0–10.5)
nRBC: 0 % (ref 0.0–0.2)

## 2019-03-07 LAB — RENAL FUNCTION PANEL
Albumin: 3.8 g/dL (ref 3.5–5.0)
Anion gap: 10 (ref 5–15)
BUN: 60 mg/dL — ABNORMAL HIGH (ref 8–23)
CO2: 25 mmol/L (ref 22–32)
Calcium: 9.6 mg/dL (ref 8.9–10.3)
Chloride: 103 mmol/L (ref 98–111)
Creatinine, Ser: 2.57 mg/dL — ABNORMAL HIGH (ref 0.44–1.00)
GFR calc Af Amer: 22 mL/min — ABNORMAL LOW (ref >60)
GFR calc non Af Amer: 19 mL/min — ABNORMAL LOW (ref >60)
Glucose, Bld: 141 mg/dL — ABNORMAL HIGH (ref 70–99)
Phosphorus: 3.5 mg/dL (ref 2.5–4.6)
Potassium: 4.8 mmol/L (ref 3.5–5.1)
Sodium: 138 mmol/L (ref 135–145)

## 2019-03-07 LAB — GLUCOSE, CAPILLARY
Glucose-Capillary: 106 mg/dL — ABNORMAL HIGH (ref 70–99)
Glucose-Capillary: 169 mg/dL — ABNORMAL HIGH (ref 70–99)

## 2019-03-07 LAB — CULTURE, RESPIRATORY W GRAM STAIN
Culture: NORMAL
Special Requests: NORMAL

## 2019-03-07 LAB — MAGNESIUM: Magnesium: 2.6 mg/dL — ABNORMAL HIGH (ref 1.7–2.4)

## 2019-03-07 MED ORDER — FUROSEMIDE 40 MG PO TABS: 40.0000 mg | ORAL_TABLET | Freq: Two times a day (BID) | ORAL | 1 refills | Status: AC

## 2019-03-07 MED ORDER — LANTUS SOLOSTAR 100 UNIT/ML ~~LOC~~ SOPN: 20.0000 [IU] | PEN_INJECTOR | Freq: Two times a day (BID) | SUBCUTANEOUS | 3 refills | Status: AC

## 2019-03-07 MED ORDER — AMOXICILLIN-POT CLAVULANATE 500-125 MG PO TABS: 1.0000 | ORAL_TABLET | Freq: Two times a day (BID) | ORAL | 0 refills | Status: AC

## 2019-03-07 MED ORDER — CARVEDILOL 6.25 MG PO TABS: 6.2500 mg | ORAL_TABLET | Freq: Two times a day (BID) | ORAL | 0 refills | Status: AC

## 2019-03-07 NOTE — Discharge Summary (Signed)
. Physician Discharge Summary  Brenda Sims HUD:[id number] DOB: 1956-04-01 DOA: 03/04/2019  PCP: Leonard Downing, MD  Admit date: 03/04/2019 Discharge date: 03/07/2019  Admitted From: Home Disposition:  Discharged to home.  Recommendations for Outpatient Follow-up:  1. Follow up with PCP in 5 - 7 for BP check and glucose check 2. Please obtain BMP/CBC in one week  Discharge Condition: Stable  CODE STATUS: FULL   Brief/Interim Summary: Brenda L Rufusis a 63 y.o.femalewith medical history significant ofHTN, IDDM, CKD 4, asthma, anxiety, systolic HF. Presents to ED w/o complaints of 2 days of cough and general chest pain. She reports that her symptoms began on Monday. She states that she felt "a little swollen" and found it difficult to breathe. She said she began coughing and thought it was fluid. So she decided to take "an extra water pill". It did not help. Her symptoms worsened throughout the day into the next. As she continued to cough, she developed midsteral and axial chest pain. It is worsened with cough and deep inspiration. She decided that she couldn't tolerate it any more, so she came to the ED. She denies any sick contacts, recent travel, fevers, N/V. Other than taking an extra diuretic Monday, she denies any changes in medication.  Discharge Diagnoses:  Active Problems:   Essential hypertension   CKD (chronic kidney disease) stage 4, GFR 15-29 ml/min (HCC)   IDDM (insulin dependent diabetes mellitus) (HCC)   Anxiety and depression   Community acquired pneumonia   Asthma, chronic, unspecified asthma severity, with acute exacerbation   Pneumonia  Bilateral PNA; likely CAP - COVID 19 negative - vanc, cefepime, doxy in ED; can continue for right now; will likely de-escalate to rocephin/doxy as she stays stable - wean O2 - Albuterol PRN - deescalte to augmentin     - continue augmentin through 03/08/2019 PM  CKD 4 - watch nephrotoxins -  baseline SCr is 3 - continue home spironolactone, lasix (40 BID)     - SCr is better than baseline; now at 2.57  IDDM - lantus 20units BID - SSI  Asthma - albuterol  HTN - BP soft right now, monitor, resume home regimen as BP improve     - coreg resumed at reduced amount; hold losartan and hydralazine    - BP wont tolerate the amount of medicine she is on; she needs her diuretics, so we will continue those; reduce home dosing of coreg, hold her hydralazine and losartan at discharge; can resume with follow up/monitoring with PCP   Anxiety/Depression - continue prozac, buspar  HLD - continue home crestor  Iron deficiency anemia - no evidence of frank bleed - continue iron supplement  CAD - continue ASA, crestor  Chronic systolic HF - BNP normal - continue lasix, spironolactone   - BP wont tolerate the amount of medicine she is on; she needs her diuretics, so we will continue those; reduce home dosing of coreg, hold her hydralazine and losartan at discharge; can resume with follow up/monitoring with PCP   Needs follow up with PCP within the next 5 - 7 days. Needs BP check in that time and titration of BP meds. Continue augmentin through 03/08/2019. Will be discharged to home.  Discharge Instructions   Allergies as of 03/07/2019      Reactions   Eggs Or Egg-derived Products Shortness Of Breath, Rash   Azithromycin Swelling   Facial/throat swelling   Cherry Swelling   Facial swelling   Latex Rash  Medication List    STOP taking these medications   amLODipine 10 MG tablet Commonly known as: NORVASC   cyclobenzaprine 10 MG tablet Commonly known as: FLEXERIL   hydrALAZINE 100 MG tablet Commonly known as: APRESOLINE   losartan 100 MG tablet Commonly known as: COZAAR     TAKE these medications   albuterol 108 (90 Base) MCG/ACT inhaler Commonly known as: VENTOLIN HFA Inhale 1-2 puffs into the lungs  every 6 (six) hours as needed for shortness of breath.   amoxicillin-clavulanate 500-125 MG tablet Commonly known as: AUGMENTIN Take 1 tablet (500 mg total) by mouth 2 (two) times a day for 3 doses.   aspirin EC 81 MG tablet Take 1 tablet (81 mg total) by mouth daily. What changed: when to take this   busPIRone 5 MG tablet Commonly known as: BUSPAR Take 1 tablet (5 mg total) by mouth 3 (three) times daily.   carvedilol 6.25 MG tablet Commonly known as: COREG Take 1 tablet (6.25 mg total) by mouth 2 (two) times daily with a meal. What changed:   medication strength  See the new instructions.   cetirizine 10 MG tablet Commonly known as: ZYRTEC TAKE 1 TABLET (10 MG TOTAL) BY MOUTH DAILY.   diphenhydrAMINE 25 MG tablet Commonly known as: BENADRYL Take 25 mg by mouth every 6 (six) hours as needed for itching or allergies.   ferrous sulfate 325 (65 FE) MG tablet Take 325 mg by mouth 3 (three) times daily.   FLUoxetine 20 MG capsule Commonly known as: PROZAC Take 20 mg by mouth daily.   fluticasone 50 MCG/ACT nasal spray Commonly known as: FLONASE Place 2 sprays into both nostrils daily as needed for allergies or rhinitis.   furosemide 40 MG tablet Commonly known as: LASIX Take 1 tablet (40 mg total) by mouth 2 (two) times daily. What changed:   See the new instructions.  Another medication with the same name was removed. Continue taking this medication, and follow the directions you see here.   gabapentin 400 MG capsule Commonly known as: NEURONTIN Take 1 capsule (400 mg total) by mouth 2 (two) times daily.   HumaLOG KwikPen 100 UNIT/ML KwikPen Generic drug: insulin lispro Inject 8 Units into the skin 3 (three) times daily. After meals   Hyoscyamine Sulfate SL 0.125 MG Subl Commonly known as: Levsin/SL Place 1 tablet under the tongue every 4 (four) hours as needed. What changed: reasons to take this   Lantus SoloStar 100 UNIT/ML Solostar Pen Generic drug:  Insulin Glargine Inject 20 Units into the skin 2 (two) times daily. What changed:   how much to take  when to take this  additional instructions   montelukast 10 MG tablet Commonly known as: SINGULAIR Take 1 tablet (10 mg total) by mouth at bedtime.   multivitamin tablet Take 1 tablet by mouth daily.   nitroGLYCERIN 0.4 MG SL tablet Commonly known as: NITROSTAT Place 1 tablet (0.4 mg total) under the tongue every 5 (five) minutes as needed for chest pain.   potassium chloride 10 MEQ tablet Commonly known as: K-DUR Take 1 tablet (10 mEq total) by mouth daily.   rosuvastatin 40 MG tablet Commonly known as: CRESTOR Take 1 tablet (40 mg total) by mouth daily. What changed: when to take this   senna 8.6 MG Tabs tablet Commonly known as: SENOKOT Take 2 tablets by mouth 2 (two) times a day.   spironolactone 50 MG tablet Commonly known as: ALDACTONE Take 1 tablet (50 mg total) by  mouth daily.       Allergies  Allergen Reactions  . Eggs Or Egg-Derived Products Shortness Of Breath and Rash  . Azithromycin Swelling    Facial/throat swelling  . Cherry Swelling    Facial swelling  . Latex Rash    Consultations:  None   Procedures/Studies: Dg Chest Portable 1 View  Result Date: 03/04/2019 CLINICAL DATA:  Shortness of breath. EXAM: PORTABLE CHEST 1 VIEW COMPARISON:  02/05/2019. FINDINGS: Surgical clips upper chest. Heart size stable. Diffuse bilateral pulmonary infiltrates/edema again noted. Interim progression from prior exam. Low lung volumes. No pleural effusion or pneumothorax. IMPRESSION: Progressive diffuse bilateral pulmonary infiltrates/edema. Low lung volumes. Electronically Signed   By: Marcello Moores  Register   On: 03/04/2019 07:46      Subjective: "I feel great."  Discharge Exam: Vitals:   03/07/19 0812 03/07/19 0913  BP: 106/65   Pulse: 80   Resp: 16   Temp: 97.7 F (36.5 C)   SpO2: 99% 94%   Vitals:   03/07/19 0229 03/07/19 0645 03/07/19 0812  03/07/19 0913  BP: (!) 106/54 (!) 107/48 106/65   Pulse: 72 77 80   Resp: 15 16 16    Temp: 98.2 F (36.8 C) 98.2 F (36.8 C) 97.7 F (36.5 C)   TempSrc: Oral Oral Oral   SpO2: 95% 100% 99% 94%  Weight:      Height:        General:63 y.o.femaleresting in bed in NAD Cardiovascular: RRR, +S1, S2, no m/g/r, equal pulses throughout Respiratory: minor wheeze, but improved inspiration, normal WOB GI: BS+, NDNT, no masses noted, no organomegaly noted MSK: No e/c/c Skin: No rashes, bruises, ulcerations noted Neuro: A&O x 3, no focal deficits Psyc: Appropriate interaction and affect, calm/cooperative    The results of significant diagnostics from this hospitalization (including imaging, microbiology, ancillary and laboratory) are listed below for reference.     Microbiology: Recent Results (from the past 240 hour(s))  Blood Culture (routine x 2)     Status: None (Preliminary result)   Collection Time: 03/04/19  7:51 AM   Specimen: BLOOD LEFT FOREARM  Result Value Ref Range Status   Specimen Description   Final    BLOOD LEFT FOREARM Performed at [facility] [address] [place], [place] [address]    Special Requests   Final    BOTTLES DRAWN AEROBIC AND ANAEROBIC Blood Culture adequate volume Performed at [facility] [address] [place], [place] [address]    Culture   Final    NO GROWTH 3 DAYS Performed at [facility], [place] [address], [place], [place] [address]    Report Status PENDING  Incomplete  SARS Coronavirus 2 (CEPHEID- Performed in [facility] hospital lab), Hosp Order     Status: None   Collection Time: 03/04/19  7:52 AM   Specimen: Nasopharyngeal Swab  Result Value Ref Range Status   SARS Coronavirus 2 NEGATIVE NEGATIVE Final    Comment: (NOTE) If result is NEGATIVE SARS-CoV-2 target nucleic acids are NOT DETECTED. The SARS-CoV-2 RNA is generally detectable in upper and lower  respiratory specimens during  the acute phase of infection. The lowest  concentration of SARS-CoV-2 viral copies this assay can detect is 250  copies / mL. A negative result does not preclude SARS-CoV-2 infection  and should not be used as the sole basis for treatment or other  patient management decisions.  A negative result may occur with  improper specimen collection / handling, submission  of specimen other  than nasopharyngeal swab, presence of viral mutation(s) within the  areas targeted by this assay, and inadequate number of viral copies  (<250 copies / mL). A negative result must be combined with clinical  observations, patient history, and epidemiological information. If result is POSITIVE SARS-CoV-2 target nucleic acids are DETECTED. The SARS-CoV-2 RNA is generally detectable in upper and lower  respiratory specimens dur ing the acute phase of infection.  Positive  results are indicative of active infection with SARS-CoV-2.  Clinical  correlation with patient history and other diagnostic information is  necessary to determine patient infection status.  Positive results do  not rule out bacterial infection or co-infection with other viruses. If result is PRESUMPTIVE POSTIVE SARS-CoV-2 nucleic acids MAY BE PRESENT.   A presumptive positive result was obtained on the submitted specimen  and confirmed on repeat testing.  While 2019 novel coronavirus  (SARS-CoV-2) nucleic acids may be present in the submitted sample  additional confirmatory testing may be necessary for epidemiological  and / or clinical management purposes  to differentiate between  SARS-CoV-2 and other Sarbecovirus currently known to infect humans.  If clinically indicated additional testing with an alternate test  methodology [phone]) is advised. The SARS-CoV-2 RNA is generally  detectable in upper and lower respiratory sp ecimens during the acute  phase of infection. The expected result is Negative. Fact Sheet for Patients:   StrictlyIdeas.no Fact Sheet for Healthcare Providers: BankingDealers.co.za This test is not yet approved or cleared by the [place] FDA and has been authorized for detection and/or diagnosis of SARS-CoV-2 by FDA under an Emergency Use Authorization (EUA).  This EUA will remain in effect (meaning this test can be used) for the duration of the COVID-19 declaration under Section 564(b)(1) of the Act, 21 U.S.C. section 360bbb-3(b)(1), unless the authorization is terminated or revoked sooner. Performed at [facility], [place] [address], [place], [place] [address]   Blood Culture (routine x 2)     Status: None (Preliminary result)   Collection Time: 03/04/19  7:56 AM   Specimen: BLOOD  Result Value Ref Range Status   Specimen Description   Final    BLOOD LEFT ANTECUBITAL Performed at [facility], [place] [address], [place], [place] [address]    Special Requests   Final    BOTTLES DRAWN AEROBIC AND ANAEROBIC Blood Culture adequate volume Performed at [facility] [address], [place], [place] [address]    Culture   Final    NO GROWTH 3 DAYS Performed at [facility], [place] [address] [place], [place] [address]    Report Status PENDING  Incomplete  Urine culture     Status: None   Collection Time: 03/04/19  8:20 AM   Specimen: In/Out Cath Urine  Result Value Ref Range Status   Specimen Description   Final    IN/OUT CATH URINE Performed at [facility] [address] [place], [place] [address]    Special Requests   Final    NONE Performed at [facility], [place] [address] [place], [place] [address]    Culture   Final    NO GROWTH Performed at [facility], [place] [address] [place], [place] [address]    Report Status 03/05/2019 FINAL  Final  MRSA PCR Screening     Status: None   Collection Time: 03/05/19  5:44 AM   Specimen: Nasal  Mucosa; Nasopharyngeal  Result Value  Ref Range Status   MRSA by PCR NEGATIVE NEGATIVE Final    Comment:        The GeneXpert MRSA Assay (FDA approved for NASAL specimens only), is one component of a comprehensive MRSA colonization surveillance program. It is not intended to diagnose MRSA infection nor to guide or monitor treatment for MRSA infections. Performed at [facility], [place] [address], [place], [place] [address]   Sputum culture     Status: None   Collection Time: 03/05/19  5:44 AM   Specimen: Sputum  Result Value Ref Range Status   Specimen Description SPUTUM  Final   Special Requests Normal  Final   Sputum evaluation   Final    THIS SPECIMEN IS ACCEPTABLE FOR SPUTUM CULTURE Performed at [facility], [place] [address], [place], [place] [address]    Report Status 03/05/2019 FINAL  Final  Culture, respiratory     Status: None   Collection Time: 03/05/19  5:44 AM   Specimen: SPU  Result Value Ref Range Status   Specimen Description   Final    SPUTUM Performed at [facility] [address], [place], [place] [address]    Special Requests   Final    Normal Reflexed from [id number] Performed at [facility], [place] [address], [place], [place] [address]    Gram Stain   Final    MODERATE WBC PRESENT, PREDOMINANTLY PMN MODERATE SQUAMOUS EPITHELIAL CELLS PRESENT MODERATE GRAM NEGATIVE COCCI IN PAIRS FEW GRAM POSITIVE RODS FEW GRAM NEGATIVE RODS FEW GRAM POSITIVE COCCI IN CHAINS IN PAIRS RARE BUDDING YEAST SEEN    Culture   Final    FEW Consistent with normal respiratory flora. Performed at [facility], [place] [address] [place], [place] [address]    Report Status 03/07/2019 FINAL  Final  Respiratory Panel by PCR     Status: None   Collection Time: 03/05/19  6:05 AM  Result Value Ref Range Status   Adenovirus NOT DETECTED NOT DETECTED Final   Coronavirus 229E NOT DETECTED NOT DETECTED Final     Comment: (NOTE) The Coronavirus on the Respiratory Panel, DOES NOT test for the novel  Coronavirus (2019 nCoV)    Coronavirus HKU1 NOT DETECTED NOT DETECTED Final   Coronavirus NL63 NOT DETECTED NOT DETECTED Final   Coronavirus OC43 NOT DETECTED NOT DETECTED Final   Metapneumovirus NOT DETECTED NOT DETECTED Final   Rhinovirus / Enterovirus NOT DETECTED NOT DETECTED Final   Influenza A NOT DETECTED NOT DETECTED Final   Influenza B NOT DETECTED NOT DETECTED Final   Parainfluenza Virus 1 NOT DETECTED NOT DETECTED Final   Parainfluenza Virus 2 NOT DETECTED NOT DETECTED Final   Parainfluenza Virus 3 NOT DETECTED NOT DETECTED Final   Parainfluenza Virus 4 NOT DETECTED NOT DETECTED Final   Respiratory Syncytial Virus NOT DETECTED NOT DETECTED Final   Bordetella pertussis NOT DETECTED NOT DETECTED Final   Chlamydophila pneumoniae NOT DETECTED NOT DETECTED Final   Mycoplasma pneumoniae NOT DETECTED NOT DETECTED Final    Comment: Performed at [facility], [place] [address], [place], [place] [address]     Labs: BNP (last 3 results) Recent Labs    01/16/19 1240 02/05/19 0058 03/04/19 0641  BNP 45.5 40.4 48.5   Basic Metabolic Panel: Recent Labs  Lab 03/04/19 0641 03/04/19 2130 03/05/19 0328 03/06/19 0904 03/07/19 0302  NA 137  --  142 138 138  K 4.4  --  4.8 4.6  4.8  CL 104  --  111 105 103  CO2 23  --  21* 22 25  GLUCOSE 229*  --  161* 207* 141*  BUN 63*  --  62* 60* 60*  CREATININE 3.00* 3.09* 3.07* 2.73* 2.57*  CALCIUM 9.2  --  8.9 9.6 9.6  MG  --   --  2.7*  --  2.6*  PHOS  --   --  3.6 3.6 3.5   Liver Function Tests: Recent Labs  Lab 03/05/19 0328 03/06/19 0904 03/07/19 0302  ALBUMIN 3.2* 3.7 3.8   No results for input(s): LIPASE, AMYLASE in the last 168 hours. No results for input(s): AMMONIA in the last 168 hours. CBC: Recent Labs  Lab 03/04/19 0641 03/04/19 2130 03/05/19 0328 03/06/19 0904 03/07/19 0302  WBC 15.0* 14.1* 13.9* 10.8* 12.1*   NEUTROABS 12.0*  --  9.5*  --  7.1  HGB 10.0* 8.9* 8.7* 9.6* 9.6*  HCT 32.6* 29.0* 29.2* 31.9* 32.4*  MCV 86.0 87.1 88.2 88.1 87.1  PLT 342 322 301 322 324   Cardiac Enzymes: No results for input(s): CKTOTAL, CKMB, CKMBINDEX, TROPONINI in the last 168 hours. BNP: Invalid input(s): POCBNP CBG: Recent Labs  Lab 03/06/19 0734 03/06/19 1212 03/06/19 1502 03/06/19 2211 03/07/19 0730  GLUCAP 115* 226* 124* 178* 106*   D-Dimer No results for input(s): DDIMER in the last 72 hours. Hgb A1c No results for input(s): HGBA1C in the last 72 hours. Lipid Profile No results for input(s): CHOL, HDL, LDLCALC, TRIG, CHOLHDL, LDLDIRECT in the last 72 hours. Thyroid function studies No results for input(s): TSH, T4TOTAL, T3FREE, THYROIDAB in the last 72 hours.  Invalid input(s): FREET3 Anemia work up No results for input(s): VITAMINB12, FOLATE, FERRITIN, TIBC, IRON, RETICCTPCT in the last 72 hours. Urinalysis    Component Value Date/Time   COLORURINE YELLOW 03/04/2019 0828   APPEARANCEUR HAZY (A) 03/04/2019 0828   LABSPEC 1.012 03/04/2019 0828   PHURINE 5.0 03/04/2019 0828   GLUCOSEU NEGATIVE 03/04/2019 0828   HGBUR NEGATIVE 03/04/2019 0828   BILIRUBINUR NEGATIVE 03/04/2019 0828   KETONESUR NEGATIVE 03/04/2019 0828   PROTEINUR NEGATIVE 03/04/2019 0828   NITRITE NEGATIVE 03/04/2019 0828   LEUKOCYTESUR NEGATIVE 03/04/2019 0828   Sepsis Labs Invalid input(s): PROCALCITONIN,  WBC,  LACTICIDVEN Microbiology Recent Results (from the past 240 hour(s))  Blood Culture (routine x 2)     Status: None (Preliminary result)   Collection Time: 03/04/19  7:51 AM   Specimen: BLOOD LEFT FOREARM  Result Value Ref Range Status   Specimen Description   Final    BLOOD LEFT FOREARM Performed at [facility], [place] [address] [place], [place] [address]    Special Requests   Final    BOTTLES DRAWN AEROBIC AND ANAEROBIC Blood Culture adequate volume Performed at [facility] [address] [place], [place] [address]    Culture   Final    NO GROWTH 3 DAYS Performed at [facility], [place] [address] [place], [place] [address]    Report Status PENDING  Incomplete  SARS Coronavirus 2 (CEPHEID- Performed in [facility] hospital lab), Hosp Order     Status: None   Collection Time: 03/04/19  7:52 AM   Specimen: Nasopharyngeal Swab  Result Value Ref Range Status   SARS Coronavirus 2 NEGATIVE NEGATIVE Final    Comment: (NOTE) If result is NEGATIVE SARS-CoV-2 target nucleic acids are NOT DETECTED. The SARS-CoV-2 RNA is generally detectable in upper and lower  respiratory specimens during the acute phase of infection. The lowest  concentration of SARS-CoV-2 viral copies this assay can detect is 250  copies / mL. A negative result does not preclude SARS-CoV-2 infection  and should not be used as the sole basis for treatment or other  patient management decisions.  A negative result may occur with  improper specimen collection / handling, submission of specimen other  than nasopharyngeal swab, presence of viral mutation(s) within the  areas targeted by this assay, and inadequate number of viral copies  (<250 copies / mL). A negative result must be combined with clinical  observations, patient history, and epidemiological information. If result is POSITIVE SARS-CoV-2 target nucleic acids are DETECTED. The SARS-CoV-2 RNA is generally detectable in upper and lower  respiratory specimens dur ing the acute phase of infection.  Positive  results are indicative of active infection with SARS-CoV-2.  Clinical  correlation with patient history and other diagnostic information is  necessary to determine patient infection status.  Positive results do  not rule out bacterial infection or co-infection with other viruses. If result is PRESUMPTIVE POSTIVE SARS-CoV-2 nucleic acids MAY BE PRESENT.   A presumptive positive result was obtained on the  submitted specimen  and confirmed on repeat testing.  While 2019 novel coronavirus  (SARS-CoV-2) nucleic acids may be present in the submitted sample  additional confirmatory testing may be necessary for epidemiological  and / or clinical management purposes  to differentiate between  SARS-CoV-2 and other Sarbecovirus currently known to infect humans.  If clinically indicated additional testing with an alternate test  methodology [phone]) is advised. The SARS-CoV-2 RNA is generally  detectable in upper and lower respiratory sp ecimens during the acute  phase of infection. The expected result is Negative. Fact Sheet for Patients:  StrictlyIdeas.no Fact Sheet for Healthcare Providers: BankingDealers.co.za This test is not yet approved or cleared by the [place] FDA and has been authorized for detection and/or diagnosis of SARS-CoV-2 by FDA under an Emergency Use Authorization (EUA).  This EUA will remain in effect (meaning this test can be used) for the duration of the COVID-19 declaration under Section 564(b)(1) of the Act, 21 U.S.C. section 360bbb-3(b)(1), unless the authorization is terminated or revoked sooner. Performed at [facility], [place] [address], [place], [place] [address]   Blood Culture (routine x 2)     Status: None (Preliminary result)   Collection Time: 03/04/19  7:56 AM   Specimen: BLOOD  Result Value Ref Range Status   Specimen Description   Final    BLOOD LEFT ANTECUBITAL Performed at [facility], [place] [address] [place], [place] [address]    Special Requests   Final    BOTTLES DRAWN AEROBIC AND ANAEROBIC Blood Culture adequate volume Performed at [facility] [address] [place], [place] [address]    Culture   Final    NO GROWTH 3 DAYS Performed at [facility], [place] [address], [place], [place] [address]    Report Status PENDING  Incomplete  Urine  culture     Status: None   Collection Time: 03/04/19  8:20 AM   Specimen: In/Out Cath Urine  Result Value Ref Range Status   Specimen Description   Final    IN/OUT CATH URINE Performed at [facility] [address] [place], [place] [address]    Special Requests   Final    NONE Performed at [facility], [place]  [address] [place], [place] [address]    Culture   Final    NO GROWTH Performed at [facility], [place] [address], [place], [place] [address]    Report Status 03/05/2019 FINAL  Final  MRSA PCR Screening     Status: None   Collection Time: 03/05/19  5:44 AM   Specimen: Nasal Mucosa; Nasopharyngeal  Result Value Ref Range Status   MRSA by PCR NEGATIVE NEGATIVE Final    Comment:        The GeneXpert MRSA Assay (FDA approved for NASAL specimens only), is one component of a comprehensive MRSA colonization surveillance program. It is not intended to diagnose MRSA infection nor to guide or monitor treatment for MRSA infections. Performed at [facility], [place] [address], [place], [place] [address]   Sputum culture     Status: None   Collection Time: 03/05/19  5:44 AM   Specimen: Sputum  Result Value Ref Range Status   Specimen Description SPUTUM  Final   Special Requests Normal  Final   Sputum evaluation   Final    THIS SPECIMEN IS ACCEPTABLE FOR SPUTUM CULTURE Performed at [facility], [place] [address], [place], [place] [address]    Report Status 03/05/2019 FINAL  Final  Culture, respiratory     Status: None   Collection Time: 03/05/19  5:44 AM   Specimen: SPU  Result Value Ref Range Status   Specimen Description   Final    SPUTUM Performed at [facility] [address] [place], [place] [address]    Special Requests   Final    Normal Reflexed from [id number] Performed at [facility], [place] [address], [place], [place] [address]    Gram Stain    Final    MODERATE WBC PRESENT, PREDOMINANTLY PMN MODERATE SQUAMOUS EPITHELIAL CELLS PRESENT MODERATE GRAM NEGATIVE COCCI IN PAIRS FEW GRAM POSITIVE RODS FEW GRAM NEGATIVE RODS FEW GRAM POSITIVE COCCI IN CHAINS IN PAIRS RARE BUDDING YEAST SEEN    Culture   Final    FEW Consistent with normal respiratory flora. Performed at [facility], [place] [address] [place], [place] [address]    Report Status 03/07/2019 FINAL  Final  Respiratory Panel by PCR     Status: None   Collection Time: 03/05/19  6:05 AM  Result Value Ref Range Status   Adenovirus NOT DETECTED NOT DETECTED Final   Coronavirus 229E NOT DETECTED NOT DETECTED Final    Comment: (NOTE) The Coronavirus on the Respiratory Panel, DOES NOT test for the novel  Coronavirus (2019 nCoV)    Coronavirus HKU1 NOT DETECTED NOT DETECTED Final   Coronavirus NL63 NOT DETECTED NOT DETECTED Final   Coronavirus OC43 NOT DETECTED NOT DETECTED Final   Metapneumovirus NOT DETECTED NOT DETECTED Final   Rhinovirus / Enterovirus NOT DETECTED NOT DETECTED Final   Influenza A NOT DETECTED NOT DETECTED Final   Influenza B NOT DETECTED NOT DETECTED Final   Parainfluenza Virus 1 NOT DETECTED NOT DETECTED Final   Parainfluenza Virus 2 NOT DETECTED NOT DETECTED Final   Parainfluenza Virus 3 NOT DETECTED NOT DETECTED Final   Parainfluenza Virus 4 NOT DETECTED NOT DETECTED Final   Respiratory Syncytial Virus NOT DETECTED NOT DETECTED Final   Bordetella pertussis NOT DETECTED NOT DETECTED Final   Chlamydophila pneumoniae NOT DETECTED NOT DETECTED Final   Mycoplasma pneumoniae NOT DETECTED NOT DETECTED Final    Comment: Performed at [facility], [place]  [address], [place], [place] [address]     Time coordinating discharge: 45 minutes  SIGNED:   Jonnie Finner, DO  [facility] 03/07/2019, 10:19 AM Pager   If 7PM-7AM, please contact night-coverage [email or web address] Password [email or web address]

## 2019-03-07 NOTE — Progress Notes (Signed)
Pt stable at this time. Pt able to walk in room with no shortness of breath and stable 02 sat of 96 percent on room air. Pt states that she wants to go home today. Pt denies pain and other needs.

## 2019-03-07 NOTE — Progress Notes (Signed)
Pt stable at time of d/c. No needs at time of d/c. rn thoroughly explained pt d/c meds and made notes for pt. No needs at time of d/c.

## 2019-03-09 LAB — CULTURE, BLOOD (ROUTINE X 2)
Culture: NO GROWTH
Culture: NO GROWTH
Special Requests: ADEQUATE
Special Requests: ADEQUATE

## 2019-03-16 ENCOUNTER — Encounter: Payer: Self-pay | Admitting: Gastroenterology

## 2019-03-16 ENCOUNTER — Ambulatory Visit: Payer: Medicaid Other | Admitting: Gastroenterology

## 2019-03-16 VITALS — BP 84/52 | HR 88 | Temp 97.3°F | Ht 64.5 in | Wt 201.4 lb

## 2019-03-16 DIAGNOSIS — R1310 Dysphagia, unspecified: Secondary | ICD-10-CM | POA: Insufficient documentation

## 2019-03-16 NOTE — Patient Instructions (Signed)
If you are age 63 or older, your body mass index should be between 23-30. Your Body mass index is 34.03 kg/m. If this is out of the aforementioned range listed, please consider follow up with your Primary Care Provider.  If you are age 16 or younger, your body mass index should be between 19-25. Your Body mass index is 34.03 kg/m. If this is out of the aformentioned range listed, please consider follow up with your Primary Care Provider.   To help prevent the possible spread of infection to our patients, communities, and staff; we will be implementing the following measures:  As of now we are not allowing any visitors/family members to accompany you to any upcoming appointments with [facility]. If you have any concerns about this please contact our office to discuss prior to the appointment.   You have been scheduled for a Barium Esophogram at [facility] (1st floor of the hospital) on Thursday, 03-19-19 at 9:30am. Please arrive 15 minutes prior to your appointment for registration. Make certain not to have anything to eat or drink 3 hours prior to your test. If you need to reschedule for any reason, please contact radiology at [phone] to do so. __________________________________________________________________ A barium swallow is an examination that concentrates on views of the esophagus. This tends to be a double contrast exam (barium and two liquids which, when combined, create a gas to distend the wall of the oesophagus) or single contrast (non-ionic iodine based). The study is usually tailored to your symptoms so a good history is essential. Attention is paid during the study to the form, structure and configuration of the esophagus, looking for functional disorders (such as aspiration, dysphagia, achalasia, motility and reflux) EXAMINATION You may be asked to change into a gown, depending on the type of swallow being performed. A radiologist and radiographer will perform  the procedure. The radiologist will advise you of the type of contrast selected for your procedure and direct you during the exam. You will be asked to stand, sit or lie in several different positions and to hold a small amount of fluid in your mouth before being asked to swallow while the imaging is performed .In some instances you may be asked to swallow barium coated marshmallows to assess the motility of a solid food bolus. The exam can be recorded as a digital or video fluoroscopy procedure. POST PROCEDURE It will take 1-2 days for the barium to pass through your system. To facilitate this, it is important, unless otherwise directed, to increase your fluids for the next 24-48hrs and to resume your normal diet.  This test typically takes about 30 minutes to perform. __________________________________________________________________________________   We have scheduled you for an follow up visit with Dr. Henrene Pastor on Friday, 04-17-19 at 10:00am.   Thank you for entrusting me with your care and for choosing [facility] - C.

## 2019-03-16 NOTE — Progress Notes (Signed)
03/16/2019 Brenda Sims [id number] 11/23/1955   HISTORY OF PRESENT ILLNESS: This is a 63 year old female who is a patient of Dr. Blanch Media.  He actually just did a telephone visit with her on July 10 for iron deficiency anemia.  The plan is for possible EGD and colonoscopy at some point in the future, but due to recent hospitalizations for pulmonary issues he wanted her to recover from that standpoint and was plan to see her back in 2 months.  She is here today with complaints of dysphasia.  She tells me that she has been having problems with this for the past year or so.  Describes to me having issues swallowing solid foods and also liquids, including water.  She was just hospitalized again with pneumonia and discharged on July 25.  She tells me that she was told she needs oxygen, but she cannot afford it so does not have it at home currently.  In regards to her anemia, her hemoglobin upon hospital discharge was stable.   Past Medical History:  Diagnosis Date  . Anemia   . Anxiety   . CAD (coronary artery disease)   . CHF (congestive heart failure) ([place])   . CKD (chronic kidney disease)   . COPD (chronic obstructive pulmonary disease) ([place])   . Depression   . Diabetes mellitus without complication ([place])   . Hypertension   . Hypothyroid   . MI (myocardial infarction) ([place])   . Sleep apnea    no cpap , "never gave me one"   Past Surgical History:  Procedure Laterality Date  . APPENDECTOMY    . BACK SURGERY    . LEFT HEART CATH AND CORONARY ANGIOGRAPHY N/A 09/11/2017   Procedure: LEFT HEART CATH AND CORONARY ANGIOGRAPHY;  Surgeon: Martinique, Peter M, MD;  Location: [facility];  Service: Cardiovascular;  Laterality: N/A;  . TOTAL KNEE ARTHROPLASTY Right 2007    reports that she has never smoked. She has never used smokeless tobacco. She reports that she does not drink alcohol or use drugs. family history includes Hypertension in her mother. Allergies  Allergen Reactions  .  Eggs Or Egg-Derived Products Shortness Of Breath and Rash  . Azithromycin Swelling    Facial/throat swelling  . Cherry Swelling    Facial swelling  . Latex Rash      Outpatient Encounter Medications as of 03/16/2019  Medication Sig  . albuterol (PROVENTIL HFA;VENTOLIN HFA) 108 (90 Base) MCG/ACT inhaler Inhale 1-2 puffs into the lungs every 6 (six) hours as needed for shortness of breath.  Marland Kitchen aspirin EC 81 MG tablet Take 1 tablet (81 mg total) by mouth daily. (Patient taking differently: Take 81 mg by mouth 2 (two) times daily. )  . busPIRone (BUSPAR) 5 MG tablet Take 1 tablet (5 mg total) by mouth 3 (three) times daily.  . carvedilol (COREG) 6.25 MG tablet Take 1 tablet (6.25 mg total) by mouth 2 (two) times daily with a meal.  . cetirizine (ZYRTEC) 10 MG tablet TAKE 1 TABLET (10 MG TOTAL) BY MOUTH DAILY.  . diphenhydrAMINE (BENADRYL) 25 MG tablet Take 25 mg by mouth every 6 (six) hours as needed for itching or allergies.  . ferrous sulfate 325 (65 FE) MG tablet Take 325 mg by mouth 3 (three) times daily.  Marland Kitchen FLUoxetine (PROZAC) 20 MG capsule Take 20 mg by mouth daily.  . fluticasone (FLONASE) 50 MCG/ACT nasal spray Place 2 sprays into both nostrils daily as needed for allergies or rhinitis.  Marland Kitchen  furosemide (LASIX) 40 MG tablet Take 1 tablet (40 mg total) by mouth 2 (two) times daily.  Marland Kitchen gabapentin (NEURONTIN) 400 MG capsule Take 1 capsule (400 mg total) by mouth 2 (two) times daily.  Marland Kitchen Hyoscyamine Sulfate SL (LEVSIN/SL) 0.125 MG SUBL Place 1 tablet under the tongue every 4 (four) hours as needed. (Patient taking differently: Place 1 tablet under the tongue every 4 (four) hours as needed (for abdominal cramping). )  . Insulin Glargine (LANTUS SOLOSTAR) 100 UNIT/ML Solostar Pen Inject 20 Units into the skin 2 (two) times daily.  . insulin lispro (HUMALOG KWIKPEN) 100 UNIT/ML KwikPen Inject 8 Units into the skin 3 (three) times daily. After meals  . montelukast (SINGULAIR) 10 MG tablet Take 1 tablet  (10 mg total) by mouth at bedtime.  . Multiple Vitamin (MULTIVITAMIN) tablet Take 1 tablet by mouth daily.  . nitroGLYCERIN (NITROSTAT) 0.4 MG SL tablet Place 1 tablet (0.4 mg total) under the tongue every 5 (five) minutes as needed for chest pain.  Marland Kitchen olmesartan (BENICAR) 40 MG tablet TAKE 1 TABLET BY MOUTH EVERY DAY FOR BLOOD PRESSURE  . potassium chloride SA (K-DUR,KLOR-CON) 10 MEQ tablet Take 1 tablet (10 mEq total) by mouth daily.  . rosuvastatin (CRESTOR) 40 MG tablet Take 1 tablet (40 mg total) by mouth daily. (Patient taking differently: Take 40 mg by mouth at bedtime. )  . senna (SENOKOT) 8.6 MG TABS tablet Take 2 tablets by mouth 2 (two) times a day.  . spironolactone (ALDACTONE) 50 MG tablet Take 1 tablet (50 mg total) by mouth daily.   No facility-administered encounter medications on file as of 03/16/2019.      REVIEW OF SYSTEMS  : All other systems reviewed and negative except where noted in the History of Present Illness.   PHYSICAL EXAM: BP (!) 84/52   Pulse 88   Temp (!) 97.3 F (36.3 C)   Ht 5' 4.5" (1.638 m)   Wt 201 lb 6 oz (91.3 kg)   BMI 34.03 kg/m  General: Well developed black female in no acute distress Head: Normocephalic and atraumatic Eyes:  Sclerae anicteric, conjunctiva pink. Ears: Normal auditory acuity Lungs: Clear throughout to auscultation; no increased WOB. Heart: Regular rate and rhythm; no M/R/G. Abdomen: Soft, non-distended.  BS present.  Non-tender. Musculoskeletal: Symmetrical with no gross deformities  Skin: No lesions on visible extremities Extremities: No edema  Neurological: Alert oriented x 4, grossly non-focal Psychological:  Alert and cooperative. Normal mood and affect  ASSESSMENT AND PLAN: *Dysphagia:  To liquids and solid foods.  Will start with barium esophagram with tablet but pending results she may need MBSS with speech as well.  Plan was for possible EGD in the future along with colonoscopy anyway to evaluate her IDA, but she  keeps being hospitalized for pulmonary issues so we were waiting for recovery from all of that first before procedures.  Will follow-up with Dr. Henrene Pastor in September as he indicated previously.   CC:  Leonard Downing, *

## 2019-03-16 NOTE — Progress Notes (Signed)
Assessment and plan reviewed 

## 2019-03-19 ENCOUNTER — Ambulatory Visit (HOSPITAL_COMMUNITY): Payer: Medicaid Other

## 2019-04-17 ENCOUNTER — Ambulatory Visit: Payer: Medicaid Other | Admitting: Internal Medicine

## 2019-07-21 IMAGING — CR DG CHEST 2V
2 series · 2 of 2 positions shown · non-contrast
Comparison: 08/15/2018

CLINICAL DATA: Lightheaded

EXAM:
CHEST - 2 VIEW

[w chest pa]
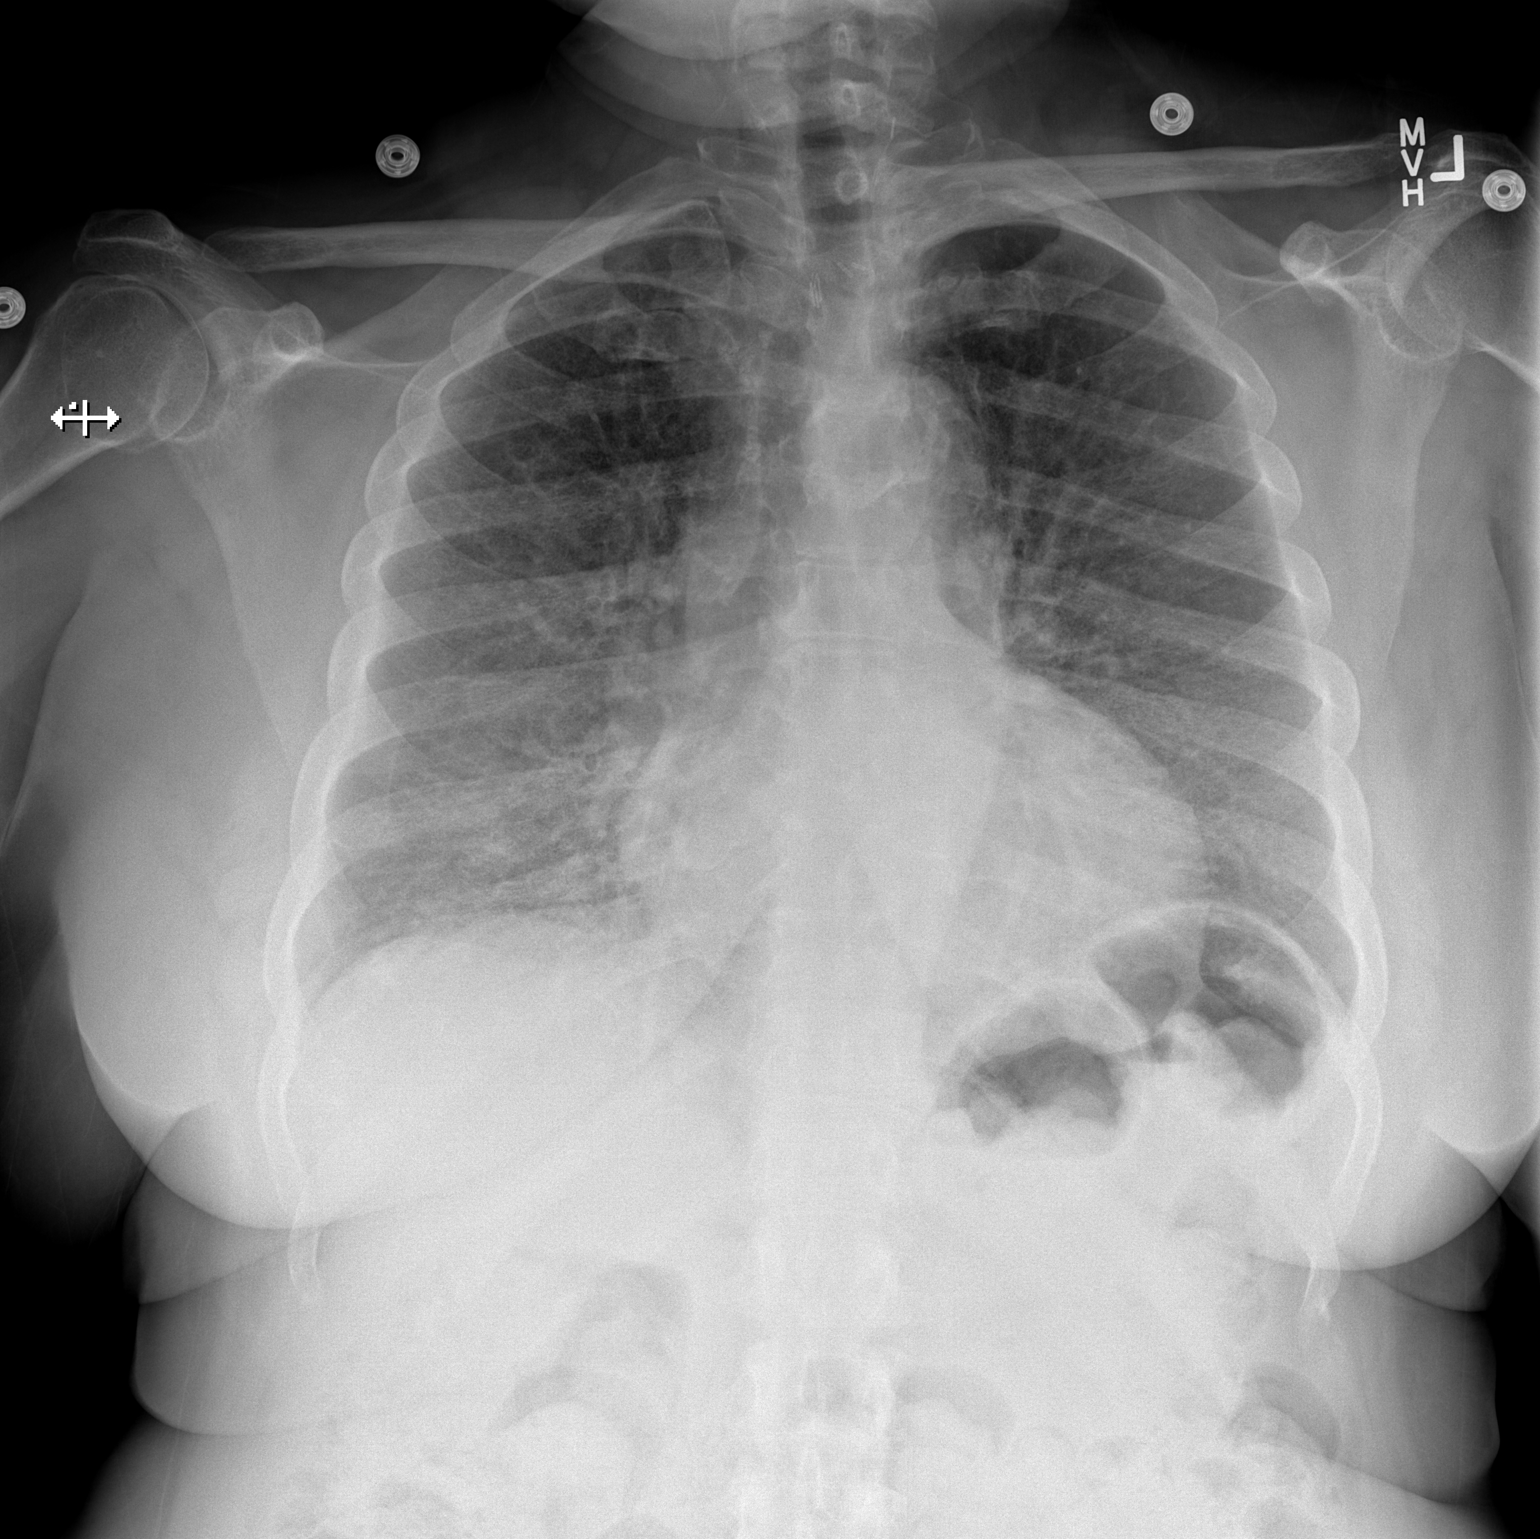

[w chest lat]
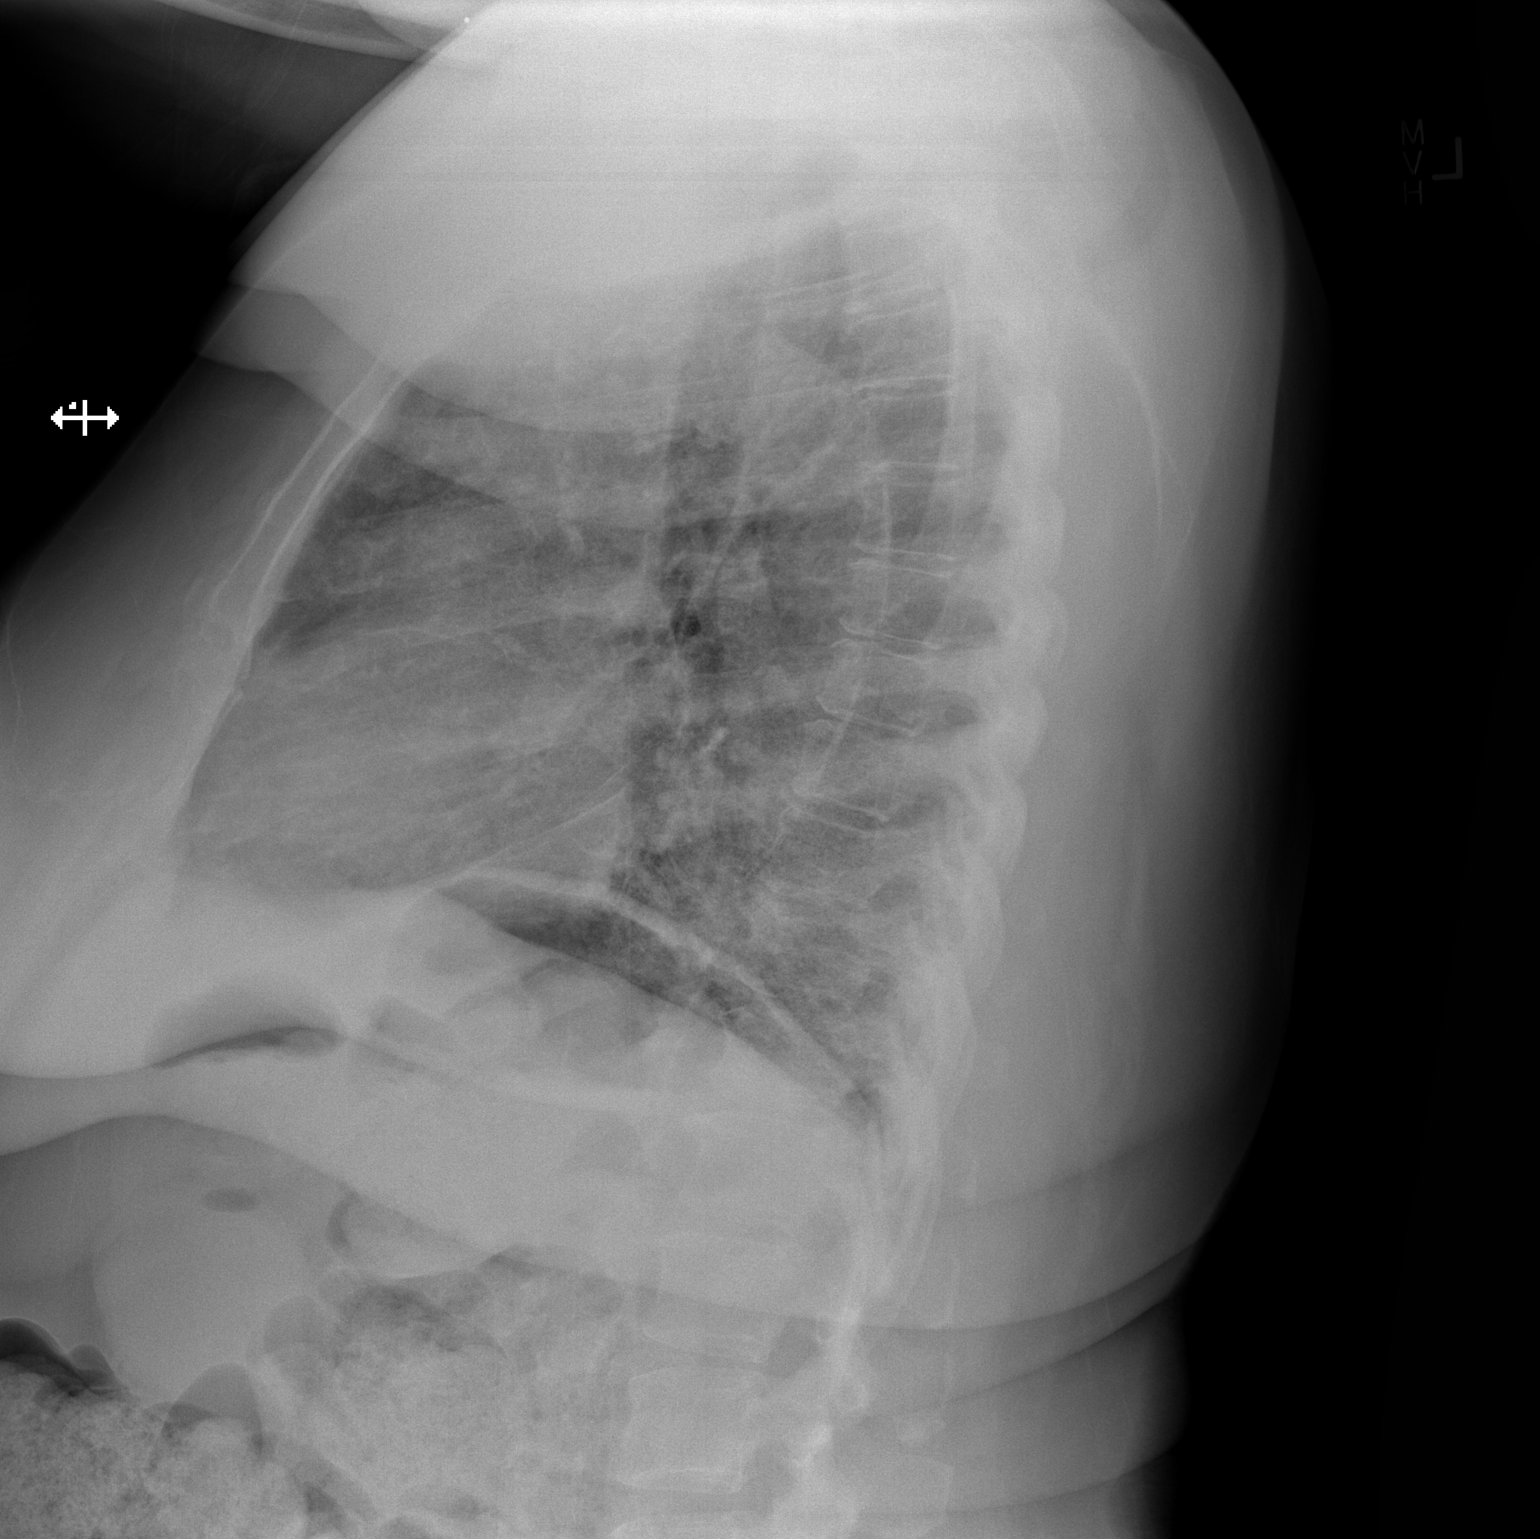

[2 of 2 positions shown; findings below may reference images not displayed]

FINDINGS: Mild cardiomegaly. Low lung volumes. Hazy airspace disease at the
right lung base. No pneumothorax or pleural effusion.
IMPRESSION: Hazy airspace disease at the right lung base.

## 2023-06-14 DEATH — deceased
